# Patient Record
Sex: Male | Born: 1945 | Race: Black or African American | Hispanic: No | Smoking: Current every day smoker
Health system: Southern US, Community
[De-identification: ages and names within clinical notes are randomized; demographics above are authoritative.]

## PROBLEM LIST (undated history)

## (undated) DIAGNOSIS — E785 Hyperlipidemia, unspecified: Secondary | ICD-10-CM

## (undated) DIAGNOSIS — D649 Anemia, unspecified: Secondary | ICD-10-CM

## (undated) DIAGNOSIS — E119 Type 2 diabetes mellitus without complications: Secondary | ICD-10-CM

## (undated) DIAGNOSIS — C9590 Leukemia, unspecified not having achieved remission: Secondary | ICD-10-CM

## (undated) DIAGNOSIS — I10 Essential (primary) hypertension: Secondary | ICD-10-CM

## (undated) DIAGNOSIS — K219 Gastro-esophageal reflux disease without esophagitis: Secondary | ICD-10-CM

## (undated) HISTORY — DX: Anemia, unspecified: D64.9

## (undated) HISTORY — DX: Gastro-esophageal reflux disease without esophagitis: K21.9

## (undated) HISTORY — PX: PTCA: SHX146

## (undated) HISTORY — DX: Type 2 diabetes mellitus without complications: E11.9

## (undated) HISTORY — DX: Essential (primary) hypertension: I10

## (undated) HISTORY — DX: Hyperlipidemia, unspecified: E78.5

---

## 2006-10-23 ENCOUNTER — Inpatient Hospital Stay: Payer: Self-pay | Admitting: Internal Medicine

## 2006-10-23 ENCOUNTER — Other Ambulatory Visit: Payer: Self-pay

## 2008-03-11 ENCOUNTER — Ambulatory Visit: Payer: Self-pay

## 2009-08-20 ENCOUNTER — Emergency Department: Payer: Self-pay | Admitting: Emergency Medicine

## 2010-06-29 ENCOUNTER — Ambulatory Visit: Payer: Self-pay | Admitting: "Endocrinology

## 2011-05-30 ENCOUNTER — Emergency Department: Payer: Self-pay | Admitting: Emergency Medicine

## 2011-06-10 ENCOUNTER — Ambulatory Visit: Payer: Self-pay

## 2011-06-11 ENCOUNTER — Emergency Department: Payer: Self-pay | Admitting: Emergency Medicine

## 2011-06-11 LAB — URINALYSIS, COMPLETE
Glucose,UR: NEGATIVE mg/dL (ref 0–75)
Nitrite: NEGATIVE
Protein: NEGATIVE
RBC,UR: 32 /HPF (ref 0–5)
Squamous Epithelial: <1
WBC UR: 1 /HPF (ref 0–5)

## 2011-07-19 ENCOUNTER — Ambulatory Visit: Payer: Self-pay | Admitting: Ophthalmology

## 2011-07-19 LAB — POTASSIUM: Potassium: 4.6 mmol/L (ref 3.5–5.1)

## 2011-07-29 ENCOUNTER — Ambulatory Visit: Payer: Self-pay | Admitting: Ophthalmology

## 2011-11-13 DIAGNOSIS — I70209 Unspecified atherosclerosis of native arteries of extremities, unspecified extremity: Secondary | ICD-10-CM | POA: Insufficient documentation

## 2012-04-01 HISTORY — PX: UPPER GASTROINTESTINAL ENDOSCOPY: SHX188

## 2012-04-01 HISTORY — PX: POPLITEAL ARTERY ANGIOPLASTY: SHX2242

## 2012-04-01 HISTORY — PX: COLONOSCOPY: SHX174

## 2012-04-27 ENCOUNTER — Ambulatory Visit: Payer: Self-pay | Admitting: Internal Medicine

## 2012-05-16 LAB — CBC WITH DIFFERENTIAL/PLATELET
Eosinophil: 4 %
HCT: 38.2 % — ABNORMAL LOW (ref 40.0–52.0)
Lymphocytes: 11 %
MCH: 31.6 pg (ref 26.0–34.0)
Monocytes: 4 %
NRBC/100 WBC: 1 /
Segmented Neutrophils: 31 %
WBC: 12.6 10*3/uL — ABNORMAL HIGH (ref 3.8–10.6)

## 2012-05-16 LAB — BASIC METABOLIC PANEL
Anion Gap: 10 (ref 7–16)
Chloride: 103 mmol/L (ref 98–107)
Creatinine: 1.3 mg/dL (ref 0.60–1.30)
EGFR (African American): >60
EGFR (Non-African Amer.): 57 — ABNORMAL LOW
Glucose: 180 mg/dL — ABNORMAL HIGH (ref 65–99)
Osmolality: 278 (ref 275–301)
Sodium: 135 mmol/L — ABNORMAL LOW (ref 136–145)

## 2012-05-16 LAB — HEPATIC FUNCTION PANEL A (ARMC)
Alkaline Phosphatase: 88 U/L (ref 50–136)
Bilirubin, Direct: 0.1 mg/dL (ref 0.00–0.20)
Bilirubin,Total: 0.4 mg/dL (ref 0.2–1.0)
SGOT(AST): 17 U/L (ref 15–37)
SGPT (ALT): 13 U/L (ref 12–78)

## 2012-05-16 LAB — SEDIMENTATION RATE: Erythrocyte Sed Rate: 20 mm/hr (ref 0–20)

## 2012-05-18 ENCOUNTER — Inpatient Hospital Stay: Payer: Self-pay | Admitting: Internal Medicine

## 2012-05-18 LAB — BASIC METABOLIC PANEL
Anion Gap: 6 — ABNORMAL LOW (ref 7–16)
Chloride: 107 mmol/L (ref 98–107)
Co2: 25 mmol/L (ref 21–32)
Creatinine: 1.42 mg/dL — ABNORMAL HIGH (ref 0.60–1.30)
EGFR (Non-African Amer.): 51 — ABNORMAL LOW
Glucose: 166 mg/dL — ABNORMAL HIGH (ref 65–99)
Osmolality: 282 (ref 275–301)
Potassium: 4.5 mmol/L (ref 3.5–5.1)
Sodium: 138 mmol/L (ref 136–145)

## 2012-05-18 LAB — CBC WITH DIFFERENTIAL/PLATELET
Comment - H1-Com1: NORMAL
HGB: 11.6 g/dL — ABNORMAL LOW (ref 13.0–18.0)
Lymphocytes: 58 %
MCH: 31.1 pg (ref 26.0–34.0)
MCHC: 32.3 g/dL (ref 32.0–36.0)
MCV: 96 fL (ref 80–100)
Monocytes: 4 %
Platelet: 190 10*3/uL (ref 150–440)
RBC: 3.74 10*6/uL — ABNORMAL LOW (ref 4.40–5.90)
Variant Lymphocyte - H1-Rlymph: 9 %
WBC: 13.3 10*3/uL — ABNORMAL HIGH (ref 3.8–10.6)

## 2012-05-19 LAB — CBC WITH DIFFERENTIAL/PLATELET
Basophil #: 0.1 10*3/uL (ref 0.0–0.1)
Eosinophil #: 0.3 10*3/uL (ref 0.0–0.7)
Eosinophil %: 2.1 %
HCT: 33.4 % — ABNORMAL LOW (ref 40.0–52.0)
HGB: 10.8 g/dL — ABNORMAL LOW (ref 13.0–18.0)
Lymphocyte %: 50.2 %
Monocyte #: 1 x10 3/mm (ref 0.2–1.0)
Monocyte %: 7.2 %
Neutrophil %: 39.9 %
RBC: 3.47 10*6/uL — ABNORMAL LOW (ref 4.40–5.90)
RDW: 14.3 % (ref 11.5–14.5)
WBC: 13.9 10*3/uL — ABNORMAL HIGH (ref 3.8–10.6)

## 2012-05-19 LAB — BASIC METABOLIC PANEL
BUN: 23 mg/dL — ABNORMAL HIGH (ref 7–18)
Chloride: 107 mmol/L (ref 98–107)
Co2: 26 mmol/L (ref 21–32)
Creatinine: 1.51 mg/dL — ABNORMAL HIGH (ref 0.60–1.30)
EGFR (Non-African Amer.): 48 — ABNORMAL LOW
Glucose: 171 mg/dL — ABNORMAL HIGH (ref 65–99)
Sodium: 140 mmol/L (ref 136–145)

## 2012-05-20 LAB — BASIC METABOLIC PANEL
Anion Gap: 7 (ref 7–16)
BUN: 17 mg/dL (ref 7–18)
Calcium, Total: 9.1 mg/dL (ref 8.5–10.1)
Chloride: 107 mmol/L (ref 98–107)
Co2: 25 mmol/L (ref 21–32)
Creatinine: 1.25 mg/dL (ref 0.60–1.30)
EGFR (African American): >60
EGFR (Non-African Amer.): >60

## 2012-05-20 LAB — CBC WITH DIFFERENTIAL/PLATELET
HCT: 36 % — ABNORMAL LOW (ref 40.0–52.0)
MCV: 97 fL (ref 80–100)
Monocytes: 4 %
NRBC/100 WBC: 0 /
RBC: 3.69 10*6/uL — ABNORMAL LOW (ref 4.40–5.90)
RDW: 14.5 % (ref 11.5–14.5)
Segmented Neutrophils: 21 %

## 2012-05-21 ENCOUNTER — Ambulatory Visit: Payer: Self-pay | Admitting: Hematology and Oncology

## 2012-06-01 ENCOUNTER — Encounter: Payer: Self-pay | Admitting: Cardiothoracic Surgery

## 2012-06-01 ENCOUNTER — Encounter: Payer: Self-pay | Admitting: Nurse Practitioner

## 2012-06-10 ENCOUNTER — Ambulatory Visit: Payer: Self-pay | Admitting: Hematology and Oncology

## 2012-06-11 LAB — CBC CANCER CENTER
Eosinophil %: 0.9 %
HCT: 33.7 % — ABNORMAL LOW (ref 40.0–52.0)
HGB: 11.1 g/dL — ABNORMAL LOW (ref 13.0–18.0)
Lymphocyte %: 59.7 %
MCH: 31.6 pg (ref 26.0–34.0)
MCHC: 33 g/dL (ref 32.0–36.0)
MCV: 96 fL (ref 80–100)
Monocyte #: 0.7 x10 3/mm (ref 0.2–1.0)
Neutrophil %: 34.1 %
Platelet: 247 x10 3/mm (ref 150–440)
RBC: 3.52 10*6/uL — ABNORMAL LOW (ref 4.40–5.90)
RDW: 14.9 % — ABNORMAL HIGH (ref 11.5–14.5)

## 2012-06-30 ENCOUNTER — Ambulatory Visit: Payer: Self-pay | Admitting: Hematology and Oncology

## 2012-06-30 ENCOUNTER — Encounter: Payer: Self-pay | Admitting: Cardiothoracic Surgery

## 2012-06-30 ENCOUNTER — Encounter: Payer: Self-pay | Admitting: Nurse Practitioner

## 2012-07-23 LAB — CBC CANCER CENTER
Eosinophil #: 0.2 x10 3/mm (ref 0.0–0.7)
Eosinophil %: 2 %
HGB: 12.4 g/dL — ABNORMAL LOW (ref 13.0–18.0)
Lymphocyte %: 60.2 %
MCH: 31.5 pg (ref 26.0–34.0)
MCHC: 32.2 g/dL (ref 32.0–36.0)
Neutrophil #: 3.8 x10 3/mm (ref 1.4–6.5)
Neutrophil %: 31.7 %
Platelet: 141 x10 3/mm — ABNORMAL LOW (ref 150–440)
RBC: 3.94 10*6/uL — ABNORMAL LOW (ref 4.40–5.90)
RDW: 15 % — ABNORMAL HIGH (ref 11.5–14.5)
WBC: 12 x10 3/mm — ABNORMAL HIGH (ref 3.8–10.6)

## 2012-07-30 ENCOUNTER — Ambulatory Visit: Payer: Self-pay | Admitting: Hematology and Oncology

## 2012-12-22 DIAGNOSIS — I359 Nonrheumatic aortic valve disorder, unspecified: Secondary | ICD-10-CM | POA: Insufficient documentation

## 2013-02-17 ENCOUNTER — Inpatient Hospital Stay: Payer: Self-pay | Admitting: Internal Medicine

## 2013-02-17 LAB — COMPREHENSIVE METABOLIC PANEL
Albumin: 2.8 g/dL — ABNORMAL LOW (ref 3.4–5.0)
BUN: 24 mg/dL — ABNORMAL HIGH (ref 7–18)
Bilirubin,Total: 0.2 mg/dL (ref 0.2–1.0)
Calcium, Total: 8.1 mg/dL — ABNORMAL LOW (ref 8.5–10.1)
Co2: 22 mmol/L (ref 21–32)
Creatinine: 1.35 mg/dL — ABNORMAL HIGH (ref 0.60–1.30)
EGFR (African American): >60
EGFR (Non-African Amer.): 54 — ABNORMAL LOW
Glucose: 188 mg/dL — ABNORMAL HIGH (ref 65–99)
Potassium: 5.4 mmol/L — ABNORMAL HIGH (ref 3.5–5.1)
SGOT(AST): 10 U/L — ABNORMAL LOW (ref 15–37)
SGPT (ALT): 13 U/L (ref 12–78)
Total Protein: 5.6 g/dL — ABNORMAL LOW (ref 6.4–8.2)

## 2013-02-17 LAB — CBC
MCHC: 32.9 g/dL (ref 32.0–36.0)
MCV: 95 fL (ref 80–100)
Platelet: 149 10*3/uL — ABNORMAL LOW (ref 150–440)
RBC: 2.36 10*6/uL — ABNORMAL LOW (ref 4.40–5.90)
RDW: 15.1 % — ABNORMAL HIGH (ref 11.5–14.5)

## 2013-02-17 LAB — CK TOTAL AND CKMB (NOT AT ARMC): CK-MB: 0.8 ng/mL (ref 0.5–3.6)

## 2013-02-17 LAB — TROPONIN I: Troponin-I: <0.02 ng/mL

## 2013-02-18 LAB — BASIC METABOLIC PANEL
Calcium, Total: 7.3 mg/dL — ABNORMAL LOW (ref 8.5–10.1)
Co2: 26 mmol/L (ref 21–32)
Creatinine: 1.46 mg/dL — ABNORMAL HIGH (ref 0.60–1.30)
EGFR (Non-African Amer.): 49 — ABNORMAL LOW
Glucose: 116 mg/dL — ABNORMAL HIGH (ref 65–99)

## 2013-02-18 LAB — URINALYSIS, COMPLETE
Bacteria: NONE SEEN
Glucose,UR: NEGATIVE mg/dL (ref 0–75)
Ketone: NEGATIVE
Nitrite: NEGATIVE
Ph: 6 (ref 4.5–8.0)
Specific Gravity: 1.008 (ref 1.003–1.030)
Squamous Epithelial: 1
WBC UR: <1 /HPF (ref 0–5)

## 2013-02-18 LAB — CBC WITH DIFFERENTIAL/PLATELET
Basophil #: 0.1 10*3/uL (ref 0.0–0.1)
Basophil %: 0.5 %
Eosinophil #: 0.1 10*3/uL (ref 0.0–0.7)
Eosinophil %: 0.6 %
HCT: 18.1 % — ABNORMAL LOW (ref 40.0–52.0)
HGB: 5.9 g/dL — ABNORMAL LOW (ref 13.0–18.0)
Lymphocyte #: 15.6 10*3/uL — ABNORMAL HIGH (ref 1.0–3.6)
Lymphocyte %: 72.9 %
MCH: 29.6 pg (ref 26.0–34.0)
MCHC: 32.5 g/dL (ref 32.0–36.0)
MCV: 91 fL (ref 80–100)
Monocyte #: 0.9 x10 3/mm (ref 0.2–1.0)
RDW: 16.1 % — ABNORMAL HIGH (ref 11.5–14.5)
WBC: 21.5 10*3/uL — ABNORMAL HIGH (ref 3.8–10.6)

## 2013-02-19 LAB — HEMOGLOBIN: HGB: 8.2 g/dL — ABNORMAL LOW (ref 13.0–18.0)

## 2013-02-22 ENCOUNTER — Ambulatory Visit: Payer: Self-pay | Admitting: Gastroenterology

## 2013-04-17 LAB — COMPREHENSIVE METABOLIC PANEL
ALT: 17 U/L (ref 12–78)
ANION GAP: 4 — AB (ref 7–16)
AST: 35 U/L (ref 15–37)
Albumin: 2.9 g/dL — ABNORMAL LOW (ref 3.4–5.0)
Alkaline Phosphatase: 62 U/L
BUN: 32 mg/dL — ABNORMAL HIGH (ref 7–18)
Bilirubin,Total: 0.2 mg/dL (ref 0.2–1.0)
CHLORIDE: 113 mmol/L — AB (ref 98–107)
CREATININE: 1.03 mg/dL (ref 0.60–1.30)
Calcium, Total: 8.3 mg/dL — ABNORMAL LOW (ref 8.5–10.1)
Co2: 22 mmol/L (ref 21–32)
EGFR (Non-African Amer.): >60
GLUCOSE: 120 mg/dL — AB (ref 65–99)
Osmolality: 286 (ref 275–301)
Potassium: 5.5 mmol/L — ABNORMAL HIGH (ref 3.5–5.1)
SODIUM: 139 mmol/L (ref 136–145)
Total Protein: 5.8 g/dL — ABNORMAL LOW (ref 6.4–8.2)

## 2013-04-17 LAB — CBC
HCT: 26.1 % — ABNORMAL LOW (ref 40.0–52.0)
HGB: 8.5 g/dL — ABNORMAL LOW (ref 13.0–18.0)
MCH: 30 pg (ref 26.0–34.0)
MCHC: 32.7 g/dL (ref 32.0–36.0)
MCV: 92 fL (ref 80–100)
PLATELETS: 105 10*3/uL — AB (ref 150–440)
RBC: 2.85 10*6/uL — AB (ref 4.40–5.90)
RDW: 16.5 % — AB (ref 11.5–14.5)
WBC: 21.4 10*3/uL — ABNORMAL HIGH (ref 3.8–10.6)

## 2013-04-17 LAB — TROPONIN I

## 2013-04-18 ENCOUNTER — Inpatient Hospital Stay: Payer: Self-pay | Admitting: Family Medicine

## 2013-04-18 LAB — CBC WITH DIFFERENTIAL/PLATELET
BASOS ABS: 0.1 10*3/uL (ref 0.0–0.1)
Basophil #: 0.1 10*3/uL (ref 0.0–0.1)
Basophil %: 0.4 %
Basophil %: 0.5 %
EOS PCT: 0.9 %
EOS PCT: 1.2 %
Eosinophil #: 0.2 10*3/uL (ref 0.0–0.7)
Eosinophil #: 0.3 10*3/uL (ref 0.0–0.7)
HCT: 20.3 % — ABNORMAL LOW (ref 40.0–52.0)
HCT: 26.3 % — AB (ref 40.0–52.0)
HGB: 6.3 g/dL — ABNORMAL LOW (ref 13.0–18.0)
HGB: 8.5 g/dL — ABNORMAL LOW (ref 13.0–18.0)
Lymphocyte #: 14.1 10*3/uL — ABNORMAL HIGH (ref 1.0–3.6)
Lymphocyte #: 14.8 10*3/uL — ABNORMAL HIGH (ref 1.0–3.6)
Lymphocyte %: 69.4 %
Lymphocyte %: 65.5 %
MCH: 28.8 pg (ref 26.0–34.0)
MCH: 29.3 pg (ref 26.0–34.0)
MCHC: 31.2 g/dL — AB (ref 32.0–36.0)
MCHC: 32.4 g/dL (ref 32.0–36.0)
MCV: 92 fL (ref 80–100)
MCV: 90 fL (ref 80–100)
MONOS PCT: 4.3 %
Monocyte #: 0.9 x10 3/mm (ref 0.2–1.0)
Monocyte #: 1 x10 3/mm (ref 0.2–1.0)
Monocyte %: 4.2 %
NEUTROS ABS: 5.1 10*3/uL (ref 1.4–6.5)
NEUTROS PCT: 28.5 %
Neutrophil #: 6.4 10*3/uL (ref 1.4–6.5)
Neutrophil %: 25.1 %
PLATELETS: 100 10*3/uL — AB (ref 150–440)
Platelet: 86 10*3/uL — ABNORMAL LOW (ref 150–440)
RBC: 2.2 10*6/uL — ABNORMAL LOW (ref 4.40–5.90)
RBC: 2.91 10*6/uL — AB (ref 4.40–5.90)
RDW: 16.5 % — ABNORMAL HIGH (ref 11.5–14.5)
RDW: 16.6 % — ABNORMAL HIGH (ref 11.5–14.5)
WBC: 20.3 10*3/uL — AB (ref 3.8–10.6)
WBC: 22.6 10*3/uL — ABNORMAL HIGH (ref 3.8–10.6)

## 2013-04-18 LAB — BASIC METABOLIC PANEL
Anion Gap: 3 — ABNORMAL LOW (ref 7–16)
BUN: 31 mg/dL — ABNORMAL HIGH (ref 7–18)
Calcium, Total: 7.7 mg/dL — ABNORMAL LOW (ref 8.5–10.1)
Chloride: 114 mmol/L — ABNORMAL HIGH (ref 98–107)
Co2: 24 mmol/L (ref 21–32)
Creatinine: 1.35 mg/dL — ABNORMAL HIGH (ref 0.60–1.30)
EGFR (African American): >60
EGFR (Non-African Amer.): 54 — ABNORMAL LOW
Glucose: 248 mg/dL — ABNORMAL HIGH (ref 65–99)
Osmolality: 296 (ref 275–301)
Potassium: 4.9 mmol/L (ref 3.5–5.1)
Sodium: 141 mmol/L (ref 136–145)

## 2013-04-19 LAB — CBC WITH DIFFERENTIAL/PLATELET
BASOS ABS: 0.1 10*3/uL (ref 0.0–0.1)
Basophil %: 0.7 %
Eosinophil #: 0.2 10*3/uL (ref 0.0–0.7)
Eosinophil %: 1 %
HCT: 24.9 % — ABNORMAL LOW (ref 40.0–52.0)
HGB: 8 g/dL — AB (ref 13.0–18.0)
LYMPHS ABS: 14.7 10*3/uL — AB (ref 1.0–3.6)
Lymphocyte %: 77.3 %
MCH: 29.5 pg (ref 26.0–34.0)
MCHC: 32.3 g/dL (ref 32.0–36.0)
MCV: 91 fL (ref 80–100)
MONO ABS: 0.7 x10 3/mm (ref 0.2–1.0)
Monocyte %: 3.7 %
NEUTROS ABS: 3.3 10*3/uL (ref 1.4–6.5)
Neutrophil %: 17.3 %
Platelet: 94 10*3/uL — ABNORMAL LOW (ref 150–440)
RBC: 2.73 10*6/uL — ABNORMAL LOW (ref 4.40–5.90)
RDW: 17.1 % — ABNORMAL HIGH (ref 11.5–14.5)
WBC: 19 10*3/uL — ABNORMAL HIGH (ref 3.8–10.6)

## 2013-04-19 LAB — BASIC METABOLIC PANEL
ANION GAP: 4 — AB (ref 7–16)
BUN: 18 mg/dL (ref 7–18)
CALCIUM: 8.5 mg/dL (ref 8.5–10.1)
CO2: 25 mmol/L (ref 21–32)
Chloride: 113 mmol/L — ABNORMAL HIGH (ref 98–107)
Creatinine: 1.13 mg/dL (ref 0.60–1.30)
EGFR (African American): >60
GLUCOSE: 87 mg/dL (ref 65–99)
Osmolality: 284 (ref 275–301)
Potassium: 4.1 mmol/L (ref 3.5–5.1)
Sodium: 142 mmol/L (ref 136–145)

## 2013-04-19 LAB — HEMOGLOBIN: HGB: 6.6 g/dL — AB (ref 13.0–18.0)

## 2013-04-20 LAB — CBC WITH DIFFERENTIAL/PLATELET
Basophil #: 0.1 10*3/uL (ref 0.0–0.1)
Basophil %: 0.4 %
Eosinophil #: 0.4 10*3/uL (ref 0.0–0.7)
Eosinophil %: 1.9 %
HCT: 25.1 % — AB (ref 40.0–52.0)
HGB: 8.3 g/dL — AB (ref 13.0–18.0)
Lymphocyte #: 11.5 10*3/uL — ABNORMAL HIGH (ref 1.0–3.6)
Lymphocyte %: 60.2 %
MCH: 29.6 pg (ref 26.0–34.0)
MCHC: 32.9 g/dL (ref 32.0–36.0)
MCV: 90 fL (ref 80–100)
Monocyte #: 1 x10 3/mm (ref 0.2–1.0)
Monocyte %: 5.2 %
NEUTROS ABS: 6.1 10*3/uL (ref 1.4–6.5)
Neutrophil %: 32.3 %
PLATELETS: 88 10*3/uL — AB (ref 150–440)
RBC: 2.8 10*6/uL — AB (ref 4.40–5.90)
RDW: 16.5 % — AB (ref 11.5–14.5)
WBC: 19 10*3/uL — ABNORMAL HIGH (ref 3.8–10.6)

## 2013-04-20 LAB — HEMOGLOBIN: HGB: 9.7 g/dL — ABNORMAL LOW (ref 13.0–18.0)

## 2013-04-21 LAB — CBC WITH DIFFERENTIAL/PLATELET
Basophil #: 0.1 10*3/uL (ref 0.0–0.1)
Basophil %: 0.7 %
Eosinophil #: 0.4 10*3/uL (ref 0.0–0.7)
Eosinophil %: 2.9 %
HCT: 24.6 % — ABNORMAL LOW (ref 40.0–52.0)
HGB: 8.2 g/dL — AB (ref 13.0–18.0)
LYMPHS ABS: 8.6 10*3/uL — AB (ref 1.0–3.6)
LYMPHS PCT: 57.4 %
MCH: 30 pg (ref 26.0–34.0)
MCHC: 33.2 g/dL (ref 32.0–36.0)
MCV: 91 fL (ref 80–100)
MONO ABS: 1.1 x10 3/mm — AB (ref 0.2–1.0)
MONOS PCT: 7.2 %
NEUTROS ABS: 4.8 10*3/uL (ref 1.4–6.5)
NEUTROS PCT: 31.8 %
Platelet: 92 10*3/uL — ABNORMAL LOW (ref 150–440)
RBC: 2.72 10*6/uL — ABNORMAL LOW (ref 4.40–5.90)
RDW: 16.3 % — ABNORMAL HIGH (ref 11.5–14.5)
WBC: 15 10*3/uL — AB (ref 3.8–10.6)

## 2013-04-21 LAB — BASIC METABOLIC PANEL
Anion Gap: 6 — ABNORMAL LOW (ref 7–16)
BUN: 7 mg/dL (ref 7–18)
CO2: 21 mmol/L (ref 21–32)
CREATININE: 1.15 mg/dL (ref 0.60–1.30)
Calcium, Total: 8.7 mg/dL (ref 8.5–10.1)
Chloride: 114 mmol/L — ABNORMAL HIGH (ref 98–107)
Glucose: 144 mg/dL — ABNORMAL HIGH (ref 65–99)
Osmolality: 282 (ref 275–301)
Potassium: 4.1 mmol/L (ref 3.5–5.1)
SODIUM: 141 mmol/L (ref 136–145)

## 2013-04-22 LAB — CBC WITH DIFFERENTIAL/PLATELET
BASOS ABS: 0.1 10*3/uL (ref 0.0–0.1)
BASOS PCT: 0.3 %
Eosinophil #: 0.4 10*3/uL (ref 0.0–0.7)
Eosinophil %: 2.2 %
HCT: 25.8 % — ABNORMAL LOW (ref 40.0–52.0)
HGB: 8.3 g/dL — ABNORMAL LOW (ref 13.0–18.0)
LYMPHS PCT: 66.1 %
Lymphocyte #: 12 10*3/uL — ABNORMAL HIGH (ref 1.0–3.6)
MCH: 29.9 pg (ref 26.0–34.0)
MCHC: 32.2 g/dL (ref 32.0–36.0)
MCV: 93 fL (ref 80–100)
MONO ABS: 1 x10 3/mm (ref 0.2–1.0)
MONOS PCT: 5.3 %
Neutrophil #: 4.7 10*3/uL (ref 1.4–6.5)
Neutrophil %: 26.1 %
Platelet: 107 10*3/uL — ABNORMAL LOW (ref 150–440)
RBC: 2.78 10*6/uL — AB (ref 4.40–5.90)
RDW: 16.5 % — ABNORMAL HIGH (ref 11.5–14.5)
WBC: 18.2 10*3/uL — AB (ref 3.8–10.6)

## 2013-06-15 ENCOUNTER — Ambulatory Visit: Payer: Self-pay | Admitting: Hematology and Oncology

## 2013-06-15 LAB — BASIC METABOLIC PANEL
Anion Gap: 10 (ref 7–16)
BUN: 19 mg/dL — AB (ref 7–18)
CALCIUM: 9.8 mg/dL (ref 8.5–10.1)
CHLORIDE: 104 mmol/L (ref 98–107)
Co2: 27 mmol/L (ref 21–32)
Creatinine: 1.31 mg/dL — ABNORMAL HIGH (ref 0.60–1.30)
EGFR (African American): >60
EGFR (Non-African Amer.): 56 — ABNORMAL LOW
GLUCOSE: 196 mg/dL — AB (ref 65–99)
Osmolality: 289 (ref 275–301)
Potassium: 4.2 mmol/L (ref 3.5–5.1)
SODIUM: 141 mmol/L (ref 136–145)

## 2013-06-15 LAB — CBC CANCER CENTER
BASOS PCT: 0.8 %
Basophil #: 0.1 x10 3/mm (ref 0.0–0.1)
EOS PCT: 2.1 %
Eosinophil #: 0.3 x10 3/mm (ref 0.0–0.7)
HCT: 39.6 % — ABNORMAL LOW (ref 40.0–52.0)
HGB: 12.6 g/dL — ABNORMAL LOW (ref 13.0–18.0)
LYMPHS PCT: 62.4 %
Lymphocyte #: 9.8 x10 3/mm — ABNORMAL HIGH (ref 1.0–3.6)
MCH: 29.2 pg (ref 26.0–34.0)
MCHC: 31.7 g/dL — ABNORMAL LOW (ref 32.0–36.0)
MCV: 92 fL (ref 80–100)
MONO ABS: 0.8 x10 3/mm (ref 0.2–1.0)
Monocyte %: 5.1 %
NEUTROS ABS: 4.6 x10 3/mm (ref 1.4–6.5)
NEUTROS PCT: 29.6 %
PLATELETS: 141 x10 3/mm — AB (ref 150–440)
RBC: 4.31 10*6/uL — ABNORMAL LOW (ref 4.40–5.90)
RDW: 16.2 % — ABNORMAL HIGH (ref 11.5–14.5)
WBC: 15.7 x10 3/mm — AB (ref 3.8–10.6)

## 2013-06-30 ENCOUNTER — Ambulatory Visit: Payer: Self-pay | Admitting: Hematology and Oncology

## 2014-04-28 DIAGNOSIS — M79609 Pain in unspecified limb: Secondary | ICD-10-CM | POA: Diagnosis not present

## 2014-04-28 DIAGNOSIS — E785 Hyperlipidemia, unspecified: Secondary | ICD-10-CM | POA: Diagnosis not present

## 2014-04-28 DIAGNOSIS — I10 Essential (primary) hypertension: Secondary | ICD-10-CM | POA: Diagnosis not present

## 2014-04-28 DIAGNOSIS — I70212 Atherosclerosis of native arteries of extremities with intermittent claudication, left leg: Secondary | ICD-10-CM | POA: Diagnosis not present

## 2014-04-29 DIAGNOSIS — I739 Peripheral vascular disease, unspecified: Secondary | ICD-10-CM | POA: Diagnosis not present

## 2014-04-29 DIAGNOSIS — M79609 Pain in unspecified limb: Secondary | ICD-10-CM | POA: Diagnosis not present

## 2014-04-29 DIAGNOSIS — I70212 Atherosclerosis of native arteries of extremities with intermittent claudication, left leg: Secondary | ICD-10-CM | POA: Diagnosis not present

## 2014-04-29 DIAGNOSIS — E785 Hyperlipidemia, unspecified: Secondary | ICD-10-CM | POA: Diagnosis not present

## 2014-05-05 DIAGNOSIS — I350 Nonrheumatic aortic (valve) stenosis: Secondary | ICD-10-CM | POA: Diagnosis not present

## 2014-05-05 DIAGNOSIS — D509 Iron deficiency anemia, unspecified: Secondary | ICD-10-CM | POA: Diagnosis not present

## 2014-05-10 DIAGNOSIS — E119 Type 2 diabetes mellitus without complications: Secondary | ICD-10-CM | POA: Diagnosis not present

## 2014-05-10 DIAGNOSIS — E784 Other hyperlipidemia: Secondary | ICD-10-CM | POA: Diagnosis not present

## 2014-05-10 DIAGNOSIS — I10 Essential (primary) hypertension: Secondary | ICD-10-CM | POA: Diagnosis not present

## 2014-05-10 DIAGNOSIS — Z125 Encounter for screening for malignant neoplasm of prostate: Secondary | ICD-10-CM | POA: Diagnosis not present

## 2014-05-11 ENCOUNTER — Ambulatory Visit: Payer: Self-pay | Admitting: Vascular Surgery

## 2014-05-11 DIAGNOSIS — E780 Pure hypercholesterolemia: Secondary | ICD-10-CM | POA: Diagnosis not present

## 2014-05-11 DIAGNOSIS — I70211 Atherosclerosis of native arteries of extremities with intermittent claudication, right leg: Secondary | ICD-10-CM | POA: Diagnosis not present

## 2014-05-11 DIAGNOSIS — I10 Essential (primary) hypertension: Secondary | ICD-10-CM | POA: Diagnosis not present

## 2014-05-11 DIAGNOSIS — E119 Type 2 diabetes mellitus without complications: Secondary | ICD-10-CM | POA: Diagnosis not present

## 2014-05-11 DIAGNOSIS — I70212 Atherosclerosis of native arteries of extremities with intermittent claudication, left leg: Secondary | ICD-10-CM | POA: Diagnosis not present

## 2014-05-11 DIAGNOSIS — Z79899 Other long term (current) drug therapy: Secondary | ICD-10-CM | POA: Diagnosis not present

## 2014-05-17 DIAGNOSIS — R011 Cardiac murmur, unspecified: Secondary | ICD-10-CM | POA: Diagnosis not present

## 2014-05-17 DIAGNOSIS — I509 Heart failure, unspecified: Secondary | ICD-10-CM | POA: Diagnosis not present

## 2014-05-30 DIAGNOSIS — I739 Peripheral vascular disease, unspecified: Secondary | ICD-10-CM | POA: Diagnosis not present

## 2014-05-30 DIAGNOSIS — F17200 Nicotine dependence, unspecified, uncomplicated: Secondary | ICD-10-CM | POA: Diagnosis not present

## 2014-05-30 DIAGNOSIS — I10 Essential (primary) hypertension: Secondary | ICD-10-CM | POA: Diagnosis not present

## 2014-05-30 DIAGNOSIS — I70213 Atherosclerosis of native arteries of extremities with intermittent claudication, bilateral legs: Secondary | ICD-10-CM | POA: Diagnosis not present

## 2014-06-22 DIAGNOSIS — R011 Cardiac murmur, unspecified: Secondary | ICD-10-CM | POA: Diagnosis not present

## 2014-07-15 DIAGNOSIS — I119 Hypertensive heart disease without heart failure: Secondary | ICD-10-CM | POA: Diagnosis not present

## 2014-07-15 DIAGNOSIS — R011 Cardiac murmur, unspecified: Secondary | ICD-10-CM | POA: Diagnosis not present

## 2014-07-20 DIAGNOSIS — H4011X2 Primary open-angle glaucoma, moderate stage: Secondary | ICD-10-CM | POA: Diagnosis not present

## 2014-07-22 NOTE — Consult Note (Signed)
Chief Complaint:  Subjective/Chief Complaint Feels well. No GI complaints. Though there was a drop in hgb, no bleeding yest.   VITAL SIGNS/ANCILLARY NOTES: **Vital Signs.:   21-Nov-14 04:34  Vital Signs Type Routine  Temperature Temperature (F) 98.4  Celsius 36.8  Pulse Pulse 63  Respirations Respirations 20  Systolic BP Systolic BP 244  Diastolic BP (mmHg) Diastolic BP (mmHg) 74  Mean BP 103  Pulse Ox % Pulse Ox % 98  Pulse Ox Activity Level  At rest  Oxygen Delivery Room Air/ 21 %   Brief Assessment:  GEN no acute distress   Cardiac Regular   Respiratory clear BS   Gastrointestinal Normal   Lab Results:  Routine Hem:  21-Nov-14 05:43   Hemoglobin (CBC)  8.2 (Result(s) reported on 19 Feb 2013 at 06:29AM.)   Assessment/Plan:  Assessment/Plan:  Assessment GI bleeding on ASa/plavix. No bleeding now. Duodenitis.   Plan If no further bleeding, patient can be discharged to home on daily PPI. Continue to hold plavix. Plan on colonoscopy as outpt on Monday here at [facility]. Will have office give prep instructions for patient before he is discharged.IF patient rebleeds today, then can keep patient over the weekend and please order bowel prep on Sunday. Thanks.   Electronic Signatures: Verdie Shire (MD)  (Signed 21-Nov-14 07:50)  Authored: Chief Complaint, VITAL SIGNS/ANCILLARY NOTES, Brief Assessment, Lab Results, Assessment/Plan   Last Updated: 21-Nov-14 07:50 by Verdie Shire (MD)

## 2014-07-22 NOTE — H&P (Signed)
PATIENT NAME:  Manuel Randolph, Manuel Randolph MR#:  [id number] DATE OF BIRTH:  Sep 23, 1945  DATE OF ADMISSION:  02/17/2013  PRIMARY CARE PHYSICIAN: Dr. Halina Maidens.   CHIEF COMPLAINT: Presyncope and black/maroon stools.   HISTORY OF PRESENT ILLNESS: This is a 69 year old male who was in his usual state of health when this morning he had a fairly large black/maroon bowel movement. 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 While he was waiting in the office, he had a presyncopal episode where he sort of blacked out but did not lose complete consciousness. He was very diaphoretic as per his significant other. EMS was called and he was brought to the hospital. The patient does have a history of diabetes but his blood sugars were stable. He was noted to be hypotensive with systolic blood pressures in the 80s. He presented to the hospital and noted to have a hemoglobin of 7.3. His previous hemoglobin was around 10. The patient was diagnosed with possibly having a presyncopal episode along a GI bleed and hospitalist services were contacted for further treatment and evaluation. The patient denied any chest pain, any shortness of breath, any nausea or any vomiting, any abdominal pain, any diarrhea, any headache or any other associated symptoms presently.   REVIEW OF SYSTEMS: CONSTITUTIONAL: No documented fever. No weight gain or weight loss.  EYES: No blurred or double vision.  ENT: No tinnitus. No postnasal drip. No redness of the oropharynx.  RESPIRATORY: No cough. No wheeze. No hemoptysis. No dyspnea.  CARDIOVASCULAR: No chest pain. No orthopnea. No palpitations. Positive presyncope.  GASTROINTESTINAL: No nausea, no vomiting, no diarrhea. No abdominal pain.  Positive melena. No hematochezia.  GENITOURINARY: No dysuria or hematuria.  ENDOCRINE: No polyuria or nocturia. No heat or cold intolerance.  HEMATOLOGIC: Positive anemia, no acute bruising or bleeding.  INTEGUMENTARY: No rashes  or lesions.  MUSCULOSKELETAL: No arthritis, no swelling, no gout.  NEUROLOGIC: No numbness or tingling. No ataxia. No seizure-type activity.  PSYCHIATRIC: No anxiety, no insomnia. No ADD.   PAST MEDICAL HISTORY: Consistent with diabetes, hypertension, hyperlipidemia, history of peripheral vascular disease, diabetic neuropathy, history of [name].   ALLERGIES: No known drug allergies.   SOCIAL HISTORY: Does smoke about a pack per day, has been smoking for the past 40 to 50 years. Used to be a heavy drinker, quit about 5 to 6 years ago. No illicit drug abuse. Lives at home with his girlfriend.   FAMILY HISTORY: The patient's mother and father are both deceased. Mother died from complications of congestive heart failure. Father died from a brain tumor.   CURRENT MEDICATIONS: As follows: Tylenol with oxycodone 5/325 one tab q.4 hours as needed, aspirin 81 mg daily, cilostazol 100 mg t.i.d., Plavix 75 mg daily, gabapentin 300 mg b.i.d., lisinopril 40 mg b.i.d., metformin 5 mg b.i.d., metoprolol tartrate 25 mg b.i.d., ranitidine 150 mg b.i.d., Senokot 1 tablet b.i.d. as needed and simvastatin 20 mg at bedtime.   PHYSICAL EXAMINATION: Presently is as follows:  VITAL SIGNS:  Temperature is 97.6, pulse 68, respirations 16, blood pressure 126/62, sats 99% on room air.  GENERAL:  The patient is a pleasant-appearing male, lethargic, but in no apparent distress. HEAD, EYES, EARS, NOSE, THROAT: He is atraumatic, normocephalic. His extraocular muscles are intact. His pupils are equal and reactive to light. His sclerae are anicteric. No conjunctival injection. No oropharyngeal erythema.  NECK: Supple. There is no jugular venous distention. No bruits. No lymphadenopathy or thyromegaly.  HEART: Regular rate and rhythm. He does have a 2 out of 6 systolic ejection murmur at the right sternal border. No rubs, no clicks.  LUNGS: Clear to auscultation bilaterally. No rales or rhonchi. No wheezes.  ABDOMEN: Soft, flat,  nontender, nondistended. Has good bowel sounds. No hepatosplenomegaly appreciated.  EXTREMITIES: No evidence of any cyanosis, clubbing or peripheral edema. Has +2 pedal and radial pulses bilaterally.  NEUROLOGICAL: The patient is alert, awake and oriented x 3 with no focal motor or sensory deficits appreciated bilaterally.  SKIN: Moist and warm with no rashes appreciated.  LYMPHATIC: There is no cervical or axillary lymphadenopathy.   LABORATORY AND RADIOLOGICAL DATA: Serum glucose 188, BUN 24, creatinine 1.3, sodium 140, potassium 5.4, chloride 111, bicarb 22. The patient's LFTs are within normal limits. Troponin less than 0.02. White cell count of 19.3, hemoglobin 7.3, hematocrit 22.3, platelet count 149. INR is 1.2.   The patient did have a chest x-ray done which showed no evidence of an acute cardiopulmonary disease.   ASSESSMENT AND PLAN: This is a 69 year old male with a history of diabetes, hypertension, hyperlipidemia, history of peripheral vascular disease, diabetic neuropathy, history of chronic lymphocytic leukemia, who presents to the hospital due to melanotic maroon stools and also noted to have a presyncopal event.  1.  Gastrointestinal bleed. This is likely a slow upper gastrointestinal bleed given the patient's melena and maroon stools. The patient has previously never had an endoscopy or colonoscopy in the past. He is on aspirin and Plavix which I will hold. I will follow serial hemoglobins, transfuse him 1 unit of packed red blood cells due to his symptomatic anemia, get a gastroenterology consult, discussed the case with Dr. Candace Cruise. Place him on Protonix b.i.d. and a clear liquid diet.  2.  Presyncope. This is likely secondary to his symptomatic anemia. I do not see any evidence of neurocardiogenic syncope. I will hold any further workup at this time.  3.  Diabetes. Since the patient is going to be a clear liquid diet, I will place him on just sliding scale insulin.  4.  History of  peripheral vascular disease. Continue his statin, continue his Pletal. I will hold his aspirin and Plavix given the gastrointestinal bleed.  5.  Diabetic neuropathy. Continue Neurontin.  6.  Acute renal failure. This is likely secondary to volume loss and gastrointestinal bleed. I will transfuse him blood and follow his BUN and creatinine.  7.  The patient is a full code.   TIME SPENT ON ADMISSION: 50 minutes.    ____________________________ Belia Heman. Verdell Carmine, MD vjs:cs D: 02/17/2013 18:02:26 ET T: 02/17/2013 18:31:30 ET JOB#: [id number]  cc: Belia Heman. Verdell Carmine, MD, <Dictator> Henreitta Leber MD ELECTRONICALLY SIGNED 03/06/2013 18:32

## 2014-07-22 NOTE — Consult Note (Signed)
CHIEF COMPLAINT and HISTORY:  Subjective/Chief Complaint Left foot pain   History of Present Illness Pt is a 69 year old African American male who was admitted with left foot pain and diabetic foot ulceration. He reports left foot pain for a few weeks which has been making it difficult to ambulate. For about 1 1/2 weeks he has had edema to the left foot and found to have ulcerations of 4th and 5th toes. He has known PAD and followed at [facility] for this. Denies hx of ulcerations in the past. Denies previous vascular intervention. He had appt with [facility] vascular last week but missed it due to weather. Denies fever, chills, or ulcer drainage. Sees Dr. Vickki Muff. Pt also was put on uloric for gout recently.  According to Dr. Graciela Husbands note last vascular check for patient was Oct-Jan and right circulation 85%, left 50%.   Past History Gout Neuropathy   PAST MEDICAL/SURGICAL HISTORY:  Past Medical History:   high cholesterol;:    PVD:    HTN:    diabetes:   ALLERGIES:  Allergies:  No Known Allergies:   HOME MEDICATIONS:  Home Medications: Medication Instructions Status  hydrochlorothiazide 25 mg oral tablet 1  orally once a day  Active  Vicodin 300 mg-10 mg oral tablet 1-2 tab(s) orally 4 times a day, As Needed Active  Aspir 81 81 mg oral tablet 1 tab(s) orally once a day Active  cilostazol 100 mg oral tablet 1 tab(s) orally 2 times a day Active  gabapentin 300 mg oral capsule  orally 2 times a day Active  simvastatin 20 mg oral tablet 1 tab(s) orally once a day (at bedtime) Active  metformin 500 mg oral tablet 1 tab(s) orally 2 times a day Active  Uloric 40 mg oral tablet 1 tab(s) orally once a day Active  lisinopril 40 mg oral tablet 1 tab(s) orally once a day Active  ranitidine 150 mg oral tablet 1 tab(s) orally 2 times a day Active   Family and Social History:  Family History Hypertension  Diabetes Mellitus   Social History positive  tobacco, negative ETOH, negative Illicit drugs,  almost 1 ppd   Review of Systems:  Fever/Chills No   Cough No   Sputum No   Abdominal Pain No   Diarrhea No   Nausea/Vomiting No   SOB/DOE No   Chest Pain No   Physical Exam:  GEN no acute distress   HEENT moist oral mucosa   NECK supple  No masses   RESP normal resp effort  clear BS   CARD regular rate  no murmur   VASCULAR ACCESS decreased pedal pulses   ABD denies tenderness  no hernia  soft  normal BS   EXTR Mild edema, left ulceration to 4th/5th toes plantar aspect in crease at toe base (no draiange or erythema)   SKIN positive ulcers   PSYCH alert, A+O to time, place, person   LABS:  Laboratory Results: Hepatic:    15-Feb-14 14:24, Hepatic Function Panel A  Bilirubin, Total 0.4  Bilirubin, Direct 0.1  Result(s) reported on 16 May 2012 at 07:26PM.  Alkaline Phosphatase 88  SGPT (ALT) 13  SGOT (AST) 17  Total Protein, Serum 9.1  Albumin, Serum 4.4  Routine Chem:    15-Feb-14 47:65, Basic Metabolic Panel (w/Total Calcium)  Glucose, Serum 180  BUN 22  Creatinine (comp) 1.30  Sodium, Serum 135  Potassium, Serum 3.9  Chloride, Serum 103  CO2, Serum 22  Calcium (Total), Serum 9.7  Anion Gap 10  Osmolality (calc) 278  eGFR (African American) >60  eGFR (Non-African American) 57  eGFR values <59m/min/1.73 m2 may be an indication of chronic  kidney disease (CKD).  Calculated eGFR is useful in patients with stable renal function.  The eGFR calculation will not be reliable in acutely ill patients  when serum creatinine is changing rapidly. It is not useful in   patients on dialysis. The eGFR calculation may not be applicable  to patients at the low and high extremes of body sizes, pregnant  women, and vegetarians.  Routine Hem:    15-Feb-14 14:24, CBC Profile  WBC (CBC) 12.6  RBC (CBC) 3.94  Hemoglobin (CBC) 12.5  Hematocrit (CBC) 38.2  Platelet Count (CBC) 203  Result(s) reported on 16 May 2012 at 02:50PM.  MCV 97  MCH 31.6  MCHC 32.6   RDW 14.3  Segmented Neutrophils 31  Lymphocytes 11  Variant Lymphocytes 50  Monocytes 4  Eosinophil 4  NRBC 1  Diff Comment 1   ANISOCYTOSIS  Diff Comment 2   NORMAL PLT MORPHOLGY   Result(s) reported on 16 May 2012 at 02:50PM.    15-Feb-14 14:24, Sedimentation Rate  Erythrocyte Sed Rate 20  Result(s) reported on 16 May 2012 at 08:52PM.   RADIOLOGY:  Radiology Results: XRay:    15-Feb-14 19:15, Foot Left AP and Lateral  Foot Left AP and Lateral  PRELIMINARY REPORT    The following is a PRELIMINARY Radiology report.  A final report will follow pending radiologist verification.      REASON FOR EXAM:    L foot pain and swelling  COMMENTS:       PROCEDURE: DXR - DXR FOOT LEFT AP AND LATERAL  - May 16 2012  7:15PM     RESULT: Comparison is made to prior study dated 04/27/2012.    Findings: There is no evidence of fracture, dislocation or malalignment.   The bones are osteopenic which may represent a component of hyperemia.    IMPRESSION:      1. No evidence of acute osseous abnormalities. As stated above, the areas   of osteopenia may be secondary to hyperemia. An etiology such as RSD is a   diagnostic consideration. If clinically warranted, further evaluation     with MRI or possibly nuclear medicine bone imaging is recommended.    Thank you for the opportunity to contribute to the care of your patient.         Dictated By: HMikki Santee M.D., MD  [place]    15-Feb-14 22:04, [place] Flow Doppler Low Extrem Bilat (Legs)  [place] Flow Doppler Low Extrem Bilat (Legs)  REASON FOR EXAM:    bilateral lower extremity swelling  COMMENTS:       PROCEDURE: [place] - [place]oreaDOPPLER LOW EXTR BILATERAL  - May 16 2012 10:04PM     RESULT: Technique: Grayscale, duplex, color flow and SPECTRAL waveform   imaging was performed to interrogate the deep venous structures of the   right and left lower extremities.    Findings:    There is no evidence of increased echogenicity, non-  compressibility,    abnormal waveforms nor abnormal color flow within the interrogated deep   venous structures of the right or left lower extremity. There is an   appropriate response to Valsalva and augmentation.  IMPRESSION:      1.  No sonographic evidence of a deep venous thrombus within the right or   left lower extremity.  Verified By: Annie Main. Burt Knack, M.D., MD  LabUnknown:    15-Feb-14 19:15, Foot Left AP and Lateral  PACS Image    15-Feb-14 22:04, [place] Color Flow Doppler Low Extrem Bilat (Legs)  PACS Image   ASSESSMENT AND PLAN:  Assessment/Admission Diagnosis Diabetic left foot infection with PAD. Currently started on antibiotics. Discussed with Dr. Lucky Cowboy. Will likely need angiogram. If schedule allows will plan for procedure Tuesday or Wednesday. If patient is discharged before then we will do an arterial duplex at our office this week and angiogram next week. Discussed the procedure with patient and family, questions answered.  Thank you for this consultation. Will follow.   Electronic Signatures: Su Grand (PA-C)  (Signed 16-Feb-14 13:15)  Authored: Chief Complaint and History, PAST MEDICAL/SURGICAL HISTORY, ALLERGIES, HOME MEDICATIONS, Family and Social History, Review of Systems, Physical Exam, LABS, RADIOLOGY, Assessment and Plan   Last Updated: 16-Feb-14 13:15 by Su Grand (PA-C)

## 2014-07-22 NOTE — Discharge Summary (Signed)
PATIENT NAME:  Manuel Randolph, Manuel Randolph MR#:  [id number] DATE OF BIRTH:  Jul 20, 1945  DATE OF ADMISSION:  05/18/2012 DATE OF DISCHARGE:  05/22/2012  ADDENDUM:  The patient's medication list has been updated and the patient is being discharged home with the following medications: 1.  Vicodin 300/10 mg 1 to 2 tablets 4 times daily as needed.  2.  Aspirin 81 mg p.o. daily. 3.  Cilostazol 100 mg p.o. twice daily.  4.  Gabapentin 300 mg p.o. twice daily.  5.  Simvastatin 10 mg p.o. at bedtime.  6.  Metformin 500 mg p.o. twice daily.  7.  Uloric acid 40 mg p.o. daily.  8.  Ranitidine 150 mg p.o. twice daily.  9.  Lisinopril 40 mg p.o. twice daily.  10.  Metoprolol tartrate 25 mg p.o. twice daily.  11.  Triple antibiotic ointment one application once a day to affected area.  12.  Senna 1 tablet twice daily as needed.  13.  Nicotine oral inhaler 10 mg inhaler every 1 to 2 hours as needed.  14.  Nicotine transdermal patch 21 mg topically daily.  15.  Amoxicillin clavulanate 875/125 mg 1 tablet twice a day.   NOTE: The patient is not going to take Plavix, which was previously ordered for him. ____________________________ Theodoro Grist, MD rv:sb D: 05/25/2012 11:56:45 ET T: 05/25/2012 12:27:49 ET JOB#: [id number]  cc: Theodoro Grist, MD, <Dictator> Halina Maidens, MD [place] MD ELECTRONICALLY SIGNED 06/22/2012 12:48

## 2014-07-22 NOTE — Consult Note (Signed)
EGD showed duodenitis. No active bleeding. Full liquid diet ordered. Advance diet as tolerated. Continue protonix po daily. Hold off on plavix. Recommend colonoscopy as outpt off plavix. Thanks.  Electronic Signatures: Verdie Shire (MD)  (Signed on 20-Nov-14 13:44)  Authored  Last Updated: 20-Nov-14 13:44 by Verdie Shire (MD)

## 2014-07-22 NOTE — Consult Note (Signed)
PATIENT NAME:  Manuel Randolph, Manuel Randolph MR#:  [id number] DATE OF BIRTH:  11-Mar-1946  DATE OF CONSULTATION:  05/17/2012  CONSULTING PHYSICIAN:  Sharlotte Alamo, DPM  REASON FOR CONSULTATION: This is a 69 year old male recently admitted to the hospital with a complaint of pain in his left foot. He has had pain for the last few weeks, getting significantly worse over the last 1-1/2 weeks. He started to notice some swelling. He has ulcerations between the fourth and fifth toes. He was scheduled for evaluation down at [facility] last week but had to miss it due to the weather. He is followed outpatient by Dr. Vickki Muff for his feet. He was recently placed on Uloric for gout.   PAST MEDICAL HISTORY:  Non-insulin-requiring diabetes, peripheral vascular disease, hypertension, hypercholesterolemia.   HOME MEDICATIONS: 1. Hydrochlorothiazide 25 mg daily.  2. Vicodin 10 mg/300 mg 1 to 2 tablets 4 times daily p.r.n. pain.  3. Aspirin 81 mg once daily.  4. Cilostazol 100 mg 1 b.i.d.  5. Gabapentin 300 mg 1 b.i.d.  6. Simvastatin 20 mg once daily at bedtime. 7. Metformin 500 mg twice a day.  8. Uloric 40 mg once a day.  9. Lisinopril 40 mg once a day.  10. Ranitidine 150 mg b.i.d.   ALLERGIES: No known drug allergies.   SOCIAL HISTORY: Long-term smoker. Denies alcohol use.   FAMILY HISTORY: Hypertension and diabetes.   REVIEW OF SYSTEMS: He relates some significant swelling in his left foot over the last week and a half. It has reduced since he has been in the hospital. He has had some drainage between the toes. He does relate some occasional numbness and paresthesias in the feet. He denies any fever or chills. No history of injury. He denies any stomach pain, heartburn, or nausea or vomiting. No shortness of breath or chest pain.  PHYSICAL EXAMINATION: VASCULAR: DP and PT pulses could not clearly be palpated. Capillary filling time appears to be intact.  NEUROLOGICAL: Protective threshold appears to be intact and  symmetric. Proprioception appears to be intact.  INTEGUMENT: Skin is warm, dry, and somewhat atrophic with some mild edema on the left as compared to the right. Absent hair growth. There is some mild drainage between the fourth and fifth toes. I could not clearly assess the ulceration between the toes due to severe pain trying to separate the toes. There does not appear to be any streaking cellulitis from the area.  MUSCULOSKELETAL: Guarded range of motion on the left secondary to pain. Muscle testing is deferred.   ASSESSMENT:  1. Diabetes with peripheral vascular disease.  2. Ulceration, left fourth and fifth toes, at this point does not appear to be severe.   PLAN: A Betadine wet-to-dry dressing was placed between the fourth and fifth toes with a light gauze wrap around the foot. I discussed with the patient that due to the severity of his symptoms in the left foot my concern is that there is some degree of vascular compromise that has gotten worse in the last couple of weeks. He will most likely need vascular exam. At this point, this may be able to be obtained outpatient versus inpatient.  We will order daily dressing changes with Betadine and a light gauze between the fourth and fifth toes and wait on vascular evaluation.   ____________________________ Sharlotte Alamo, DPM tc:cb D: 05/17/2012 15:16:52 ET T: 05/17/2012 15:36:44 ET JOB#: [id number]  cc: Sharlotte Alamo, DPM, <Dictator> Justin A. Vickki Muff, [place] DPM ELECTRONICALLY SIGNED 05/22/2012 14:45

## 2014-07-22 NOTE — Op Note (Signed)
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   PROCEDURES PERFORMED:  1.  Abdominal aortogram.  2.  Left lower extremity distal runoff, third order catheter placement.  3.  Additional third order catheter placement.  4.  Percutaneous transluminal angioplasty, left peroneal artery to 2.5 mm.  5.  Percutaneous transluminal angioplasty, left anterior tibial artery to 3 mm.  6.  ProGlide closure of the right groin site.   SURGEON: Hortencia Pilar, M.D.   SEDATION: Versed 5 mg plus fentanyl 200 mcg administered IV. Continuous ECG, pulse oximetry and cardiopulmonary monitoring is performed throughout the entire procedure by the interventional radiology nurse. Total sedation time is 1 hour, 30 minutes.   ACCESS: 6-French sheath, right common femoral artery.   CONTRAST USED: Isovue 74 mL.   FLUOROSCOPY TIME: 9.9 minutes.   INDICATIONS: The patient is a 69 year old gentleman admitted to the hospital and was found to have ulcerations and gangrene of his left foot. Pulses are nonpalpable on physical examination confirming atherosclerotic occlusive disease and he is, therefore, undergoing angiography with the hope for intervention for limb salvage. The risks and benefits were reviewed. All questions answered. The patient agrees to proceed.   DESCRIPTION OF PROCEDURE: The patient is taken to the special procedure suite, placed in the supine position. After adequate sedation is achieved, the right groin is prepped and draped in a sterile fashion. Lidocaine 1% is infiltrated in the soft tissues and access to the common femoral artery is obtained with ultrasound guidance.  Ultrasound is utilized secondary to lack of appropriate landmarks and to avoid vascular injury. Under direct visualization, the common femoral artery is identified, femoral bifurcation is also localized and the artery is scanned more proximally. The artery is noted to be homogeneous and pulsatile indicating patency. Image is recorded for the permanent record, and the micropuncture needle is used to access the anterior wall under direct visualization. Microwire followed by microsheath, J-wire followed by 5-French sheath and 5-French pigtail catheter. Pigtail catheter is advanced to the level of T12 and AP projection of the aortoiliac system is obtained. Pigtail catheter is repositioned and an RAO projection of the pelvis is obtained. A stiff angled Glidewire and pigtail catheter are used to cross the aortic bifurcation. The catheter is advanced down into the proximal common femoral where an LAO projection of the left groin is obtained. Wire is reintroduced and negotiated into the SFA and the pigtail catheter is negotiated into the proximal one third of the SFA where distal runoff is obtained. Distal images down at the mid calf and ankle are inadequate and ultimately the angled Glidewire, a straight 120 slip catheter and a V18 wire are negotiated into the peroneal where distal peroneal runoff is obtained demonstrating a dominant distal branch to the lateral plantar. V18 wire is reintroduced and a 2.5 x 10 balloon is advanced across the proximal one third, where tandem subtotal occlusions exist. Balloon is inflated to 16 atmospheres for 1 full minute. Followup angiography demonstrates less than 5% residual stenosis and rapid flow of contrast. There is a small dissection, but it is non-flow-limiting.   The wire is then pulled back and negotiated into the anterior tibial where the V18 wire in conjunction with the straight slip catheter  and subsequently an 0.18 CXI angled catheter, are used to negotiate the wire and CXI  catheter all the way down to the dorsalis pedis where hand injection demonstrates patency of the dorsalis pedis with filling of the pedal arch. V18 wire is reintroduced and the 2.5 balloon x 10 balloon is advanced down with the distal marker just distal to the ankle joint. Inflation is performed to 12 atmospheres for 1 minute. A 3 x 30 balloon is then advanced down to the level of the malleoli and the remaining length of the anterior tibial in its entirety is angioplastied to 3 mm. Inflation is for 1 minute to 10 atmospheres. Followup angiography demonstrates two areas of residual stenosis at the level of the ankle joint in the area previously treated with a 2.5 mm balloon. Nitroglycerin 250 mcg was then infused intra-arterially and later another 100 mcg is infused as well. The 2.5 balloon is reintroduced down to this level at the ankle and inflated this time to 20 atmospheres for maximal diameter of 2.7 mm. Followup angiography demonstrates resolution of this area of narrowing. There is now rapid flow of contrast from and SFA injection down to the foot via patent anterior tibial and peroneal filling both the lateral plantar and the pedal arch.   Sheath is pulled into the right external iliac. ProGlide device is used. Initial device does not capture the needle, but the second device captures the needle and yields excellent hemostasis. There are no immediate complications.   INTERPRETATION: The abdominal aorta is opacified with a bolus injection of contrast. There are no hemodynamically significant stenoses. Bilateral nephrograms are noted, normal size. Single renal arteries are identified bilaterally. No evidence of significant renal artery stenosis. The aortic bifurcation is widely patent as is the common iliacs and external iliacs bilaterally. Internal iliacs demonstrate diffuse disease bilaterally, but they are patent.   The right common femoral and visualized portions of the SFA and profunda femoris  demonstrate extensive atherosclerotic changes, but there are no hemodynamically significant focal stenoses.   The left common femoral is patent, again with significant atherosclerotic changes, but no hemodynamically significant disease, and this is true of the superficial femoral and popliteal as well. Profunda femoris is patent; however, just distal to the first set of branched within the main trunk of the profunda, there is fairly extensive disease, which does appear to be hemodynamically significant and on the order of 50 to 65%. There is very poor collateralization of the profunda with the geniculates. The distal popliteal is patent. Initially it is difficult to ascertain which are the tibial vessels and which are collateral vessels; however, the dominant runoff at the time of the initial injection does appear to be the peroneal and therefore, this is the first vessel to be treated as described above. After inflation of a 2.5 mm balloon, there is wide patency of the peroneal down to the ankle filling the lateral plantar. In a similar fashion after intervention within the anterior tibial, there is wide patency of the anterior tibial down into the dorsalis pedis, now filling the pedal arch.   SUMMARY: Successful revascularization with treatment of both the peroneal and the anterior tibial to provide maximal blood flow to the foot.  ____________________________ Katha Cabal, MD ggs:aw D: 05/19/2012 12:30:43 ET T: 05/19/2012 12:46:48 ET JOB#: [id number]  cc: Katha Cabal, MD, <Dictator> Halina Maidens, MD Sharlotte Alamo, [place] MD ELECTRONICALLY SIGNED 05/26/2012 11:23

## 2014-07-22 NOTE — H&P (Signed)
PATIENT NAME:  Manuel Randolph, Manuel Randolph MR#:  [id number] DATE OF BIRTH:  May 29, 1945  DATE OF ADMISSION:  05/17/2012  REFERRING PHYSICIAN: Pollie Friar, MD  PRIMARY CARE PHYSICIAN: Halina Maidens, MD      CHIEF COMPLAINT: Left foot pain.   HISTORY OF PRESENT ILLNESS: This is a 69 year old male with known past medical history of diabetes mellitus, peripheral vascular disease followed at [facility], gout, hypertension, hyperlipidemia, neuropathy, presents with pain in the left foot. The patient has been complaining of the pain for the last month or so; as well, he complains of some edema as well. The patient was seen by his primary care physician where he was started on Uloric for gout without much improvement of his symptoms; as well, the patient recently saw Podiatry Service at [facility], Dr. Vickki Muff, who examined the patient, where he had a concern of peripheral vascular disease as well as venous insufficiency. The patient presents today with significant pain which could not be controlled on his home Percocet. Upon presentation to the ED, the patient was afebrile. The patient had basic work-up done which did show mild leukocytosis of 12.6. As mentioned earlier, the patient was afebrile. On physical exam, the patient was noticed to have left fourth and fifth toe new ulceration with some serous discharge. The patient was started on IV Cipro in the ED, and due to significant pain which could not be controlled with p.o. meds, hospitalist service was requested to admit the patient. The patient denies any recent trauma in his left foot and reports he did order diabetic shoes last week which should be ready in 2 weeks.  The patient denies any chest pain, any shortness of breath, any coffee-ground emesis.   The patient reports he has history of peripheral vascular disease, and he usually follows at [facility] with that. He had an appointment last week, but he missed it because of the weather.  He reports the last time he was seen  there was around October or January when he had some studies done which he reports he has circulation 50% in his left lower extremity and 85% in the right lower extremity. He cannot recall which exact studies he had done.   PAST MEDICAL HISTORY: 1. Diabetes.  2. Peripheral vascular disease.  3. Gout.  4. Hypertension.  5. Hyperlipidemia.  6. Neuropathy.   ALLERGIES: No known drug allergies.   HOME MEDICATIONS: Hydrochlorothiazide, simvastatin, lisinopril, metformin, cilostazol, Neurontin, ranitidine, aspirin and Percocet.   SOCIAL HISTORY: The patient reports he smokes 1 pack per day, reports remote history of alcohol use but reports he quit smoking a few years ago.   FAMILY HISTORY: No history of heart disease or cancer in the family.   REVIEW OF SYSTEMS:  CONSTITUTIONAL: The patient denies fever, chills, fatigue, or weakness.  EYES: Denies blurry vision, double vision, pain, inflammation.  ENT: Denies tinnitus, ear pain, hearing loss, epistaxis or discharge.  RESPIRATORY: Denies cough, wheezing, hemoptysis, dyspnea or pneumonia.  CARDIOVASCULAR: Denies chest pain, arrhythmia, palpitations, syncope. Has mild baseline edema.  GASTROINTESTINAL: Denies nausea, vomiting, diarrhea, abdominal pain, hematemesis, jaundice, rectal bleed, constipation.  GENITOURINARY: Denies dysuria, hematuria, or renal colic.  ENDOCRINE: Denies polyuria, polydipsia, heat or cold intolerance.  HEMATOLOGY: Denies anemia, easy bruising, bleeding diathesis.  INTEGUMENTARY: Denies acne or rash, has a new left foot ulceration.  MUSCULOSKELETAL: Denies any shoulder pain, knee pain, arthritis. Has cramps in the lower extremities and some claudication.  NEUROLOGIC: Denies any dysarthria, epilepsy, tremors, vertigo, ataxia, dementia, CVA, seizures or  memory loss.  PSYCHIATRIC: Denies anxiety, insomnia, bipolar disorder, depression, or schizophrenia.   PHYSICAL EXAMINATION: VITAL SIGNS: Temperature 98.5, pulse 73,  respiratory rate 20, blood pressure 152/81, saturating 100% on room air.  GENERAL: Well-nourished male, looks comfortable and in no apparent distress.  HEENT: Head atraumatic, normocephalic. Pupils are equal, reactive to light. Pink conjunctivae. Anicteric sclerae. Moist oral mucosa.  NECK: Supple. No thyromegaly. No JVD.  CHEST: Good air entry bilaterally. No wheezing, rales or rhonchi.  CARDIOVASCULAR: S1, S2 heard. No rubs, murmurs, or gallops.  ABDOMEN: Soft, nontender, nondistended. Bowel sounds present.  EXTREMITIES: Has some mild bilateral lower extremity pedal edema. No clubbing. No cyanosis. Has nonpalpable pulses, only palpable by Doppler. Skin is thin and shiny. Has mild ulcerative lesion at the inferior area of the fourth and fifth left toes.  NEUROLOGIC: Cranial nerves are grossly intact. Motor 5 out of 5. Sensation symmetrical and intact. Has tenderness to palpation in the left lower extremity.  SKIN: Normal skin turgor, warm and dry. Has some shiny smooth skin in the lower extremity which might indicate some PVD.   PERTINENT LABORATORY AND RADIOLOGICAL DATA:  Glucose 180, BUN 22, creatinine 1.3, sodium 135, potassium 3.9, chloride 103, CO2 22, ALT 13, AST 17, alkaline phosphatase 88. White blood cells 4.6, hemoglobin 12.5, hematocrit 38.2, platelets 203.   Venous Doppler negative for DVT in bilateral lower extremities.   ASSESSMENT AND PLAN: 1. Left lower extremity pain:  This appears to be multifactorial, mainly due to diabetic neuropathy component; as well, there is a known history of some ischemic peripheral vascular disease which might contribute into that as well, but it seems to be the main source of the patient's pain is his fourth and fifth toes where the patient appears to have developed some new foot ulceration with some serous discharge. The patient will be admitted, started on IV Cipro. A foot x-ray was done.  We will await an official reading to rule out osteomyelitis. We  will consult the wound care team.  As well, we will consult the podiatry service. The patient's venous Doppler is negative for deep vein thrombosis.  2. Diabetes mellitus: We will hold metformin. We will have the patient on insulin sliding scale. We will check hemoglobin A1c.  3. Tobacco abuse: The patient was counseled and will be started on Nicoderm patch.  4. Peripheral vascular disease: We will continue with cilostazol. We will consult Vascular Surgery.  5. Gout: We will continue with Uloric.  6. Hypertension:  Continue home medication.  7. Hyperlipidemia: Continue with statin.  8. Neuropathy: Continue with Neurontin.  9. Deep vein thrombosis prophylaxis: Subcutaneous heparin.  10. Gastrointestinal prophylaxis: On ranitidine.   CODE STATUS:  FULL CODE.    TOTAL TIME SPENT ON ADMISSION AND PATIENT CARE: 55 minutes.   ____________________________ Albertine Patricia, MD dse:cb D: 05/17/2012 02:36:12 ET T: 05/17/2012 08:23:51 ET JOB#: [id number]  cc: Albertine Patricia, MD, <Dictator> DAWOOD Graciela Husbands MD ELECTRONICALLY SIGNED 05/18/2012 3:51

## 2014-07-22 NOTE — Discharge Summary (Signed)
PATIENT NAME:  Manuel Randolph, Manuel Randolph MR#:  [id number] DATE OF BIRTH:  [birth year]  DATE OF ADMISSION:  05/18/2012 DATE OF DISCHARGE:  05/22/2012  ADMITTING DIAGNOSES:  1. Left lower extremity pain. 2. Diabetes mellitus.  3. Tobacco abuse. 4. Peripheral vascular disease. 5. Gout. 6. Hypertension. 7. Hyperlipidemia. 8. Neuropathy and deep venous thrombosis prophylaxis and gastrointestinal prophylaxis.   DISCHARGE DIAGNOSES: 1. Peripheral artery disease with gangrene left foot ulcer.  2. Suspected mild cellulitis.  3. Status post aortogram and percutaneous transluminal angioplasty to left peroneal as well as left anterior tibial vessels with improvement of blood flow and the patient's condition and patient's pain.  4. Left lower extremity pain due to peripheral artery disease.  5.  Diabetes mellitus, type 2, hemoglobin A1c 6.4.  6. 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  7. Hypertension. 8. Dehydration.  9. Acute renal failure, resolved on intravenous fluids. 10. Tobacco abuse.   DISCHARGE CONDITION: Stable.   DISCHARGE MEDICATIONS:  1. The patient is to continue Vicodin 10/300, 1 to 2 tablets 4 times daily as needed.  2. Aspirin 81 mg p.o. daily.  3. Cilostazol 100 mg p.o. twice daily. 4. Gabapentin 300 mg p.o. twice daily. 5. Simvastatin 20 mg p.o. at bedtime.  6. Metformin 500 mg p.o. twice daily.  7. Dilaudid 40 mg p.o. daily. 8. Ranitidine 150 mg p.o. twice daily.  9. Lisinopril 40 mg p.o. twice daily. This is the planned dose. 10. Clopidogrel 75 mg p.o. daily. This is a new medication.  11. Metoprolol tartrate 25 mg p.o. twice daily.   12. Bacitracin, neomycin, polymyxin, pramoxine 500 units/3.5 mg/10,000 units/10 mg and 1 gram topical ointment. /3.5 mg/10,000 units/10 mg in 1 gram topical ointment to affected area once a day.  13. Senna 1 tablet twice daily as needed.  14. Nicotine oral inhaler every 1 to 2 hours as needed.  15. Nicotine  topical transdermal film 21 mg topically daily.  16. Amoxicillin clavulanate 875/125 mg 1 tablet twice daily for 7 more days.  17. The patient is not to take hydrochlorothiazide.   DIET: A 2 gram salt, low fat, low cholesterol, carbohydrate-controlled diet, regular consistency.   ACTIVITY LIMITATIONS: As tolerated.   FOLLOWUP APPOINTMENT: Dr. Army Melia in 2 days after discharge.    CONSULTANTS: Dr. Cleda Mccreedy, Podiatry, Dr. Delana Meyer, Vascular Surgery.  PROCEDURES: Abdominal aortogram with runoff and lower left lower extremity percutaneous transluminal angioplasty, 2.5 mm left peroneal and percutaneous transluminal angioplasty to 3 mm, left anterior tibial and local anesthesia with minimal estimated blood loss with no complications on 09/29/1599.   RADIOLOGIC STUDIES: Left foot PA and lateral x-rays on 05/18/2012 showed no evidence of acute osseous abnormalities. There is osteopenia, maybe secondary to her hyperemia. Etiology such as RSD is a diagnostic consideration. If clinically warranted further evaluation with MR and possibly nuclear medicine bone images was recommended. Ultrasound of bilateral lower extremities 05/18/2012 revealed no sonographic evidence of deep vein thrombosis within the right or left lower extremity.   HISTORY OF PRESENT ILLNESS: The patient is a 69 year old African American male with past medical history significant for a history of peripheral artery disease, a history of ongoing tobacco abuse, who presented to the hospital with complaints of left foot pain. Please refer to Dr. Graciela Husbands admission 05/18/2012.  On arrival to the Emergency Room, the patient's temperature was 98.5, pulse was 73, respiration rate was 20, blood pressure was 152/81, saturation was 100% on room air.   Physical exam revealed mild bilateral pedal edema. The patient had  nonpalpable pulses only felt by doctor. The skin was thin and shiny. He had also ulcerative lesion in the inferior area of 4th to 5th  left toes. Otherwise, physical exam was unremarkable.   The patient's lab data done on the day of admission, February 17th, showed the elevated BUN to 22, glucose 180, sodium 135, otherwise BMP was unremarkable. The patient's liver enzymes revealed mild elevation of total protein to 9.1, otherwise liver enzymes were normal. White blood cell count was elevated to 12.6, hemoglobin was 12.5, platelet count 203. The patient was admitted to the hospital. He was evaluated by Dr. Cleda Mccreedy, Podiatry, who felt the patient had diabetes with peripheral vascular disease as well as ulceration of his left 4th to 5th toes and which did not look severe. It was recommended at that time wet-to-dry dressing between the 4th and 5th toes with a light gauze wrap around the foot. He also discussed with the patient that due to the severity of these symptoms, Dr. Cleda Mccreedy was concerned that that the patient has some degree of vascular compromise, which could have gotten worse in the last couple of weeks, and for this reason, he recommended vascular evaluation. Dr. Delana Meyer saw the patient in consultation in the next day or so and proceeded to a vascular procedure on 05/19/2012. The patient underwent arthrogram with runoff of lower extremity and PTA was placed at the left peroneal as well as left AT vessel. There was good postoperative blood flow. Postprocedure, the patient felt satisfactory. His pain abated and his wound seemed to be healing. The patient is to continue current pain management with Vicodin. He is to continue also cilostazol as well as aspirin and Plavix for his peripheral artery disease. He is to continue amoxicillin for 7 more days to complete antibiotic course. He is to have a triple-antibiotic ointment placed between his toes and he is to follow up with his podiatrist, Dr. Cleda Mccreedy, in the next few days after discharge as well as Wound Clinic in the next few days after discharge. He is to follow up with his primary care  physician, Dr. Army Melia, for podiatric followup appointment.   For diabetes mellitus type 2, hemoglobin A1c was found to be 6.4. It was felt that the patient's diabetes was quite well controlled.   The patient was noted to have some lymphoproliferative disorder, as based on abnormal differential. Apparently the patient's RBCs were found to be normal on smear but platelets varied in size and toxic vacuolization was noted. Also there were variable  lymphocyte forms. The patient would benefit from evaluation by oncologist/hematologist as outpatient. The patient's white blood cell count remained high despite antibiotic therapy. On 05/20/2012, the patient's white blood cell count was 14.4. It is recommended to follow the patient's CBC as outpatient and make decisions about Oncology-Hematology consultation as outpatient since it was felt that the patient does have some abnormalities on his differential including  abnormal forms of lymphocytes, hence the patient would be a candidate for evaluation by Oncology and Hematology.   In regard to dehydration, as well as acute renal failure, initially on arrival to the hospital the patient was noted to be dehydrated. His kidney function was also compromised. On 05/18/2012 the patient's BUN and creatinine were elevated to 20 and 1.42. On the 05/19/2012, the patient's BUN and creatinine were 23 and 1.51. However, with IV fluid administration, the patient's creatinine improved. On the 19th of February 2014 it was 17 and 1.25. It is recommended to follow the patient's creatinine  levels as outpatient especially now since he is on high doses of ACE inhibitors.   For hypertension, the patient's blood pressure medications were advanced and the patient's blood pressure was much better controlled. On the day of discharge, 05/22/2012, the patient's blood pressure is still fluctuating between 837G to 902X systolic and 11B to 52C diastolic. His temperature is 98.3, pulse was 60,  respiratory rate was 18, pulse oximetry 100% on room air at rest.   The patient was counseled about tobacco abuse and it was recommended to quit and nicotine replacement therapy was initiated for him upon discharge. The patient is being discharged in stable condition with the above-mentioned medications and followup.   TIME SPENT: 40 minutes.   Of noted other studies which were performed in the hospital include CPR, which was 0.73. Also ESR rate was checked and was found to be 20. No further investigations in regards to underlying inflammation was made. No superficial wound cultures were also taken while in the hospital. It is recommended to check the patient's wound clinically and make decisions about changing antibiotics if needed.    ____________________________ Theodoro Grist, MD rv:jm D: 05/22/2012 19:10:00 ET T: 05/23/2012 14:50:08 ET JOB#: [id number]  cc: Halina Maidens, MD Theodoro Grist, MD, <Dictator> [place] MD ELECTRONICALLY SIGNED 06/23/2012 15:47

## 2014-07-22 NOTE — Discharge Summary (Signed)
PATIENT NAME:  Manuel, Randolph MR#:  [id number] DATE OF BIRTH:  [birth year]  DATE OF ADMISSION:  02/17/2013 DATE OF DISCHARGE:  02/19/2013  PRESENTING COMPLAINT: Presyncopal episode.   DISCHARGE DIAGNOSES:  1.  Presyncope secondary to symptomatic anemia, improved.  2.  Symptomatic anemia due to slow gastrointestinal bleed, suspected from duodenitis in the setting of the patient being on aspirin,  Plavix and Pletal.  3.  Hypertension.  4.  Type 2 diabetes.   PROCEDURES: Esophagogastroduodenoscopy showed duodenitis.  CODE STATUS: Full code.   MEDICATIONS:  1.  Simvastatin 20 mg at bedtime.  2.  Metformin 500 mg b.i.d.  3.  Ranitidine 150 mg b.i.d.  4.  Lisinopril 40 mg b.i.d.  5.  Metoprolol tartrate 25 mg b.i.d.  6.  Acetaminophen/oxycodone 325/5 mg 1 tablet q.4 hours p.r.n.  7.  Gabapentin 300 mg b.i.d.  8.  Senna laxative 8.6 mg 1 tablet b.i.d. as needed.  9.  Protonix 40 mg b.i.d.  10. The patient advised to stop the aspirin, Plavix and cilostazol due to gastrointestinal bleed until further notice.   DIET: Low sodium and carbohydrate-controlled diet.   FOLLOWUP: With Dr. Candace Cruise in 1 to 2 weeks. Follow up with Dr. Army Melia in 2 to 4 weeks.  The patient advised your colonoscopy is scheduled for 11/24.   LABS: Hemoglobin at discharge was 8.2. UA negative for UTI. Chest x-ray: Low volume with underlying chronic interstitial coarsening. Creatinine 1.35, sodium 140. PT-INR is 15.4 and 1.2.   BRIEF SUMMARY OF HOSPITAL COURSE: Manuel Randolph is 69 year old gentleman with history of diabetes, hypertension, hyperlipidemia and history of peripheral vascular disease, diabetic neuropathy, history of [name], who presents to the hospital due to melanotic maroon stools an alterative presyncopal episode. He was admitted with:  1.  GI bleed, which appears to be slow upper GI bleed given the patient is melena and maroon stools. The patient is on aspirin and Plavix, which were held. He received 3 units of  blood transfusion. Hemoglobin is down to 8.2. No maroon stools were noted in the hospital. The patient was seen by Dr. Candace Cruise. 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 The patient is supposed to get colonoscopy on November 24, Monday, by Dr. Candace Cruise. He will follow up with him as outpatient.  2.  Presyncope secondary to symptomatic anemia. No evidence of neurocardiogenic syncope. Hold any further workup at this time since the patient is improved.  3.  Type 2 diabetes. The patient was placed on sliding scale insulin. His home meds were resumed at discharge.  4.  History of peripheral vascular disease. The patient was continued on statins. He is recommend to hold off on his Pet[place], aspirin and Plavix given his GI bleed until further notice.  5.  Diabetic neuropathy. Continue Neurontin.  6.  Acute renal failure, mild secondary to volume loss and GI bleed. Received IV fluids. Hospital stay otherwise remained stable.   CODE STATUS: The patient remained a full code.   TIME SPENT: 40 minutes.  ____________________________ Hart Rochester Posey Pronto, MD sap:aw D: 02/22/2013 07:07:47 ET T: 02/22/2013 07:34:15 ET JOB#: [id number]  cc: Chavon Lucarelli A. Posey Pronto, MD, <Dictator> Lupita Dawn. Candace Cruise, MD Halina Maidens, MD Ilda Basset MD ELECTRONICALLY SIGNED 02/22/2013 14:18

## 2014-07-22 NOTE — Consult Note (Signed)
PATIENT NAME:  Manuel Randolph, Manuel Randolph MR#:  [id number] DATE OF BIRTH:  23-Jan-1946  DATE OF CONSULTATION:  02/18/2013  CONSULTING PHYSICIAN:  Lupita Dawn. Helaina Stefano, MD  REASON FOR REFERRAL: Melena and significant anemia.   DESCRIPTION: The patient is a 69 year old black male who had a large dark stool yesterday morning. He did not feel sick then, but then he went to a financial office later that day. In the office, he had a presyncopal episode where he almost blacked out, but never completely lost his consciousness. He was diaphoretic. EMS was called, and he was brought to the Emergency Room, where he was found to have systolic blood pressure in the 80s. His hemoglobin was only 7.3. His previous hemoglobin was 10. Apparently, until yesterday, he felt fine. He did not have any prior hematochezia, melena, nausea, vomiting, indigestion or heartburn. The patient was given IV fluids, and then his hemoglobin fell to 5.9. The patient is currently getting 2 units of blood right now. He is feeling much better. He has not had any further bleeding since being admitted.   REVIEW OF SYMPTOMS: There are no changes. Please review the initial review of symptoms dictated by Dr. Verdell Carmine.   PAST MEDICAL HISTORY: Notable for diabetes, hypertension, hyperlipidemia, history of peripheral vascular disease and diabetic neuropathy. He also has a history of [name].   ALLERGIES: He has no known drug allergies.   SOCIAL HISTORY: He smokes a pack a day for the past 40 years or so. 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   FAMILY HISTORY: Notable for congestive heart failure and brain tumor.   CURRENT MEDICATIONS: Tylenol with oxycodone, baby aspirin, Plavix 75 mg daily. He also takes gabapentin, lisinopril, metformin, metoprolol, Zantac and Senokot as needed. He also takes Zocor at bedtime.   PHYSICAL EXAMINATION:  VITAL SIGNS: Currently, he is afebrile. His vital signs are stable.  HEAD AND NECK: Within normal limits.   CARDIAC: Regular rhythm and rate. There is a systolic murmur.  LUNGS: Clear bilaterally.  ABDOMEN: Showed normoactive bowel sounds, soft, nontender throughout. There is no hepatomegaly.  EXTREMITIES: No clubbing, cyanosis or edema.  NEUROLOGIC: Nonfocal.  SKIN: Negative.   LABORATORY DATA: Initial hemoglobin was 7.3, now it is 5.9 from last night, white count was 21.9. Electrolytes are normal except for creatinine is 1.46, glucose 116. Liver enzymes are normal. Troponin level is normal. INR is 1.2. UA is negative.   ASSESSMENT AND PLAN: This is a patient at risk for acute gastrointestinal bleeding. There is no active bleeding right now. We have to rule out upper gastrointestinal bleeding. There is no prior history of ulcers. Nevertheless, with aspirin and Plavix, he can bleeding anywhere internally. I recommend to at least do an upper endoscopy today to find the source of bleeding. If there are bleeding ulcers, the area can be cauterized. The aspirin and Plavix were held. Will need to continue to monitor hemoglobin. At some point, he will need a colonoscopy off the Plavix because he has never had a colonoscopy before.   ____________________________ Lupita Dawn. Candace Cruise, MD pyo:lb D: 02/18/2013 13:22:56 ET T: 02/18/2013 13:34:43 ET JOB#: [id number]  cc: Lupita Dawn. Candace Cruise, MD, <Dictator> Lupita Dawn Amberli Ruegg MD ELECTRONICALLY SIGNED 02/18/2013 14:26

## 2014-07-22 NOTE — Consult Note (Signed)
Pt seen and examined. Full consult to follow. Dark stool yest AM. Presyncopal episode yest at an office.. Had moonshine on Fri night. Smokes 1ppd. No prior endoscopies. On ASA/plavix.  Initial Hgb 7.3. Dropped to 5.9 overnight. Getting his 2nd unit of blood now. Feels better. NPO. ASA/plavix on hold. Will plan EGD today. Will eventually need colonoscopy off plavix. Thanks   Electronic Signatures: Verdie Shire (MD) (Signed on 20-Nov-14 07:45)  Authored   Last Updated: 20-Nov-14 07:46 by Verdie Shire (MD)

## 2014-07-23 NOTE — Discharge Summary (Signed)
PATIENT NAME:  Manuel Randolph, Manuel Randolph MR#:  [id number] DATE OF BIRTH:  [birth year]  DATE OF ADMISSION:  04/18/2013 DATE OF DISCHARGE:  04/22/2013  CONSULTANT:  Arther Dames, M.D.   REASON FOR ADMISSION: Lower gastrointestinal bleeding.   OTHER DIAGNOSES:  1.  Acute blood loss anemia requiring transfusions due to gastrointestinal bleed.  2.  Pneumonia readings on x-rays.  3.  Hyperkalemia.  4.  Borderline low blood sugars.  5.  Type 2 diabetes.  6.  Hypertension.  7.  Hyperlipidemia.   DISPOSITION: Home.  MEDICATIONS AT DISCHARGE:  1.  Simvastatin 20 mg once a day. 2.  Metformin 500 mg twice daily. 3.  Lisinopril 40 mg twice daily. 4.  Metoprolol 25 mg twice daily. 5.  Gabapentin 300 mg 4 times a day. 6.  Aspirin 81 mg once a day, hold for 14 days until authorized by Dr. Rayann Heman. 7.  Plavix 75 mg once a day, hold for 14 days until authorized by Dr. Rayann Heman. 8.  Amlodipine 5 mg once a day. 9.  Protonix 40 mg twice daily. 10.  Levaquin 500 mg once a day for 5 days.   HOSPITAL COURSE: A 69 year old gentleman with history of type 2 diabetes, noninsulin dependent diabetes, hypertension, hyperlipidemia, peripheral vascular disease and [name].    The patient presented to the Emergency Department with GI bleeding back in November 2014 secondary to duodenitis after a positive endoscopy and negative colonoscopy. On the day of admission, the patient presented with 3 bowel movements with dark blood mixed with stool, without any significant abdominal pain. The patient did not have any further episodes in the Emergency Department, and his vitals were stable. He was found to have a potassium of 5.5; slightly hemolyzed. His hemoglobin was 8.5 with a platelet count of 105. He was slightly tachycardic with a heart rate of 105. Due to his intestinal bleeding, the patient was put on a Protonix drip, hold aspirin and Plavix and transfusion was ordered if his hemoglobin was below 7. A CBC was drawn every 6 hours. Dr.  Arther Dames was consulted for GI.  PROBLEMS:    1.  Gastrointestinal bleeding. The patient had a Protonix drip and he was kept n.p.o. His aspirin and Plavix were held. The patient had a transfusion of packed red blood cells, so he had acute  blood loss anemia. Capsule endoscopy was ordered, as the patient has history of having a previous endoscopy in the past, colonoscopy as well. The colonoscopy did not show any significant source of active bleeding, for what a bleeding scan would be indicated if the patient continues to have bleeding, although if the bleeding stopped, for what the bleeding scan was not of use at that moment. As a possible source, Dr. Rayann Heman described possible small angiectasias in the distal ileum seen on the capsule. He was okay with discharging the patient. Will need colonoscopy if further bleeding or drop of hemoglobin. The patient, at that moment, was stable. He was started on a diet, for what he was discharged.   As far as his other medical problems, for his peripheral vascular disease, the patient is not allowed to take aspirin or Plavix for now, until at least the next 2 weeks. The patient needs to see Dr. Rayann Heman prior to starting to take that medication. CT scan of the abdomen and pelvis was done, and it showed evidence of pneumonia, for what the patient was started on Levaquin.  The patient did not really show major signs of pneumonia, other  than slight cough, although the patient had a current cough. The patient also had hypertension, which was well controlled.   The patient was discharged in good condition, without any significant problems. Recommended to have a rich iron diet to help with his anemia. Follow up with Dr. Halina Maidens and Dr. Arther Dames in the next 1 to 2 weeks.   time spend 35 min ____________________________ [place] Sink, MD rsg:mr D: 04/25/2013 07:46:00 ET T: 04/25/2013 16:35:07 ET JOB#: [id number]  cc: Halina Maidens, MD Arther Dames,  MD Cristi Loron MD ELECTRONICALLY SIGNED 04/30/2013 6:43

## 2014-07-23 NOTE — Consult Note (Signed)
Details:   - GI follow up:  S: no comlaints, want to go home.  NO bleeding. No f/c, no abd pain  O: vss, a and o x4 cta, no w/c rrr, no m/r/g soft, nabs, nt,nd  Labs:  Hgb stable at 8.3 today.   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  - ok with d/c today or tomorrow since no bleeding and hgb stable.  - will need colonoscopy if futher bleeding or drop in Hgb - I will schedule clinic appt   Lab Results:  Routine Hem:  22-Jan-15 05:16   WBC (CBC)  18.2  RBC (CBC)  2.78  Hemoglobin (CBC)  8.3  Hematocrit (CBC)  25.8  Platelet Count (CBC)  107  MCV 93  MCH 29.9  MCHC 32.2  RDW  16.5  Neutrophil % 26.1  Lymphocyte % 66.1  Monocyte % 5.3  Eosinophil % 2.2  Basophil % 0.3  Neutrophil # 4.7  Lymphocyte #  12.0  Monocyte # 1.0  Eosinophil # 0.4  Basophil # 0.1 (Result(s) reported on 22 Apr 2013 at 08:47AM.)   Electronic Signatures: Arther Dames (MD)  (Signed 22-Jan-15 17:29)  Authored: Details, Lab Results   Last Updated: 22-Jan-15 17:29 by Arther Dames (MD)

## 2014-07-23 NOTE — Consult Note (Signed)
Brief Consult Note: Diagnosis: GI bleed.   Patient was seen by consultant.   Consult note dictated.   Recommend to proceed with surgery or procedure.   Orders entered.   Comments: GI bleed.   recent EGD and colon 01/2013 without source.  Elev WBC is unusual.  Could this be ischemic colitis ?   will plan for capsule endo since he had recent EGD and colon.  clears today.  Miralax prep in am and capsule tomorrow. - CT if capsule study negative to eval for ischemic colitis.  Electronic Signatures: Arther Dames (MD)  (Signed 18-Jan-15 17:10)  Authored: Brief Consult Note   Last Updated: 18-Jan-15 17:10 by Arther Dames (MD)

## 2014-07-23 NOTE — Consult Note (Signed)
Details:   - GI follow up  S: no futher bleeding today.  no n/v, abd pain f/c.  wants to go home  Exam: vss cta, no w/c rrr, no m/r/g soft, nabs, nt  Labs:  Hgb stable today.  Capsule read today: no blood in stomach or small bowel. Possible small angioectasia in distal small bowel.    A/P:  1.) LGIB: no bleeding since yesterday, Hgb stable.  - if further bleeding or drop in Hgb, will plan for colonoscopy to treat distal small bowel AVM. - if no further bleeding and Hgbs stable, like home tomorrow.  with outpt f/u.   Lab Results:  Routine Chem:  21-Jan-15 06:27   Result Comment CBC - SMEAR SCANNED  Result(s) reported on 21 Apr 2013 at 08:33AM.  Glucose, Serum  144  BUN 7  Creatinine (comp) 1.15  Sodium, Serum 141  Potassium, Serum 4.1  Chloride, Serum  114  CO2, Serum 21  Calcium (Total), Serum 8.7  Anion Gap  6  Osmolality (calc) 282  eGFR (African American) >60  eGFR (Non-African American) >60 (eGFR values <56m/min/1.73 m2 may be an indication of chronic kidney disease (CKD). Calculated eGFR is useful in patients with stable renal function. The eGFR calculation will not be reliable in acutely ill patients when serum creatinine is changing rapidly. It is not useful in  patients on dialysis. The eGFR calculation may not be applicable to patients at the low and high extremes of body sizes, pregnant women, and vegetarians.)  Routine Hem:  21-Jan-15 06:27   WBC (CBC)  15.0  RBC (CBC)  2.72  Hemoglobin (CBC)  8.2  Hematocrit (CBC)  24.6  Platelet Count (CBC)  92  MCV 91  MCH 30.0  MCHC 33.2  RDW  16.3  Neutrophil % 31.8  Lymphocyte % 57.4  Monocyte % 7.2  Eosinophil % 2.9  Basophil % 0.7  Neutrophil # 4.8  Lymphocyte #  8.6  Monocyte #  1.1  Eosinophil # 0.4  Basophil # 0.1   Electronic Signatures: RArther Dames(MD)  (Signed 21-Jan-15 17:18)  Authored: Details, Lab Results   Last Updated: 21-Jan-15 17:18 by RArther Dames(MD)

## 2014-07-23 NOTE — Consult Note (Signed)
PATIENT NAME:  Manuel Randolph, HUGE MR#:  [id number] DATE OF BIRTH:  Jan 24, 1946  DATE OF CONSULTATION:  04/18/2013  CONSULTING PHYSICIAN:  Arther Dames, MD  REFERRING PHYSICIAN:  Dr. Valentino Nose  REASON FOR CONSULTATION:  Lower GI bleed.  HISTORY OF PRESENT ILLNESS: [name] is a 69 year old male with a history notable for diabetes, [name], hypertension, peripheral vascular disease, who is presenting to the hospital for evaluation of lower GI bleeding. [name] reports that starting yesterday he had several episodes of stool with bright red blood mixed in. His last bowel movement, he did have one episode of this this morning.   [name] does have a history of lower GI bleeds. He was admitted to the hospital in November 2014, and at that time had an upper endoscopy that only showed some gastritis. He then went on to have a colonoscopy as an outpatient in late November. This study was completely normal, except for external hemorrhoids. Based on that, the plan was to start him back on aspirin and Plavix, which he is currently on.   Now currently, he is re-presenting for similar symptoms to the end of November. Other than the bleeding, he does not report any other symptoms such as abdominal pain, weight loss or dysphagia. He does not have any family history of GI malignancy.   PAST MEDICAL HISTORY: 1.  Type 2 diabetes.  2.  Hypertension.  3.  Hyperlipidemia.  4.  Peripheral vascular disease.  5.  [name].   ALLERGIES: No known drug allergies.  MEDICATIONS:  1.  Aspirin 81 daily.  2.  Plavix 75 mg daily.  3.  Gabapentin 300 mg 4 times a day.  4.  Lisinopril 40 mg b.i.d.  5.  Metformin 500 mg b.i.d.  6.  Metoprolol 25 mg b.i.d.  7.  Simvastatin 20 mg at bedtime.   SOCIAL HISTORY: He is an ongoing smoker. He denies alcohol or recreational drugs.   FAMILY HISTORY: He denies to me any family history of colon cancer or other GI malignancies.  REVIEW OF SYSTEMS:   CONSTITUTIONAL: No weight gain  or weight loss.  No fever or chills. HEENT: No oral lesions or sore throat. No vision changes. GASTROINTESTINAL: See HPI.  HEME/LYMPH: No easy bruising or bleeding. CARDIOVASCULAR: No chest pain or dyspnea on exertion. GENITOURINARY: No hematuria. INTEGUMENTARY: No rashes or pruritus PSYCHIATRIC: No depression/anxiety.  ENDOCRINE: No heat/cold intolerance, no hair loss or skin changes. ALLERGIC/IMMUNOLOGIC: Negative for hives. RESPIRATORY: No cough, no shortness of breath.  MUSCULOSKELETAL: No joint swelling or muscle pain.  PHYSICAL EXAMINATION: VITAL SIGNS: Temperature is 98.9, pulse is 62, respirations are 16, blood pressure is 129/71, pulse ox is 100% on room air.  GENERAL: Alert and oriented times 4.  No acute distress. Appears stated age. HEENT: Normocephalic/atraumatic. Extraocular movements are intact. Anicteric. NECK: Soft, supple. JVP appears normal. No adenopathy. CHEST: Clear to auscultation. No wheeze or crackle. Respirations unlabored. HEART: Regular. No murmur, rub, or gallop.  Normal S1 and S2. ABDOMEN: Soft, nontender, nondistended.  Normal active bowel sounds in all four quadrants.  No organomegaly. No masses EXTREMITIES: No swelling, well perfused. SKIN: No rash or lesion. Skin color, texture, turgor normal. NEUROLOGICAL: Grossly intact. PSYCHIATRIC: Normal tone and affect. MUSCULOSKELETAL: No joint swelling or erythema.   LABORATORY DATA:  Sodium is 141, potassium 4.9, BUN 31, creatinine 1.35. His albumin is 2.9, otherwise liver enzymes are normal. White count is 22.6, hemoglobin is 8.5, hematocrit is 26, MCV is 90.   ASSESSMENT AND  PLAN:  1.  Lower gastrointestinal bleed: It does not seem as though he has lost a significant amount of blood, given his hemoglobin on presentation, which went down to 6.3 during this hospital admission. It is important to note that he has had an EGD and a colonoscopy back in November 2014 only showing some duodenitis with a normal  colonoscopy, except for external hemorrhoids.   Given that he has had such recent studies, I do not plan on repeating these tests at this time. Would I would like to do instead is go ahead with a capsule endoscopy, since this has not been done yet and we do not have a good source for the bleeding.   In addition, it is a little bit surprising that his white count is 22.6. I do not have a great explanation for this, other than if this could possibly be ischemic colitis that could account for an elevated white count. To investigate this, it would be reasonable to perform a CT scan of the abdomen and pelvis to evaluate for evidence of colonic ischemia. The other option would be to wait and see if the white count and the symptoms continue to improve. Nonetheless, I will go ahead with a capsule endoscopy to evaluate for a small bowel source of the bleeding.   In addition, in the meantime, we can continue to monitor his hemodynamics and hemoglobin. If he were to develop evidence of active bleeding, which it does not appear that he has at this time, would also recommend going ahead with a tagged RBC scan.   Thank you for this consult.    ____________________________ Arther Dames, MD mr:mr D: 04/18/2013 15:03:40 ET T: 04/18/2013 20:16:26 ET JOB#: [id number]  cc: Arther Dames, MD, <Dictator> Mellody Life MD ELECTRONICALLY SIGNED 04/20/2013 19:53

## 2014-07-23 NOTE — Consult Note (Signed)
GI follow up some futher bleeding today.  Dark in color, suggestive of upper or small bowel bleedwants solid food.  NO abd pain, n/v, f/c a and o x 4no w/cno m/r/gsoft, nd,nt 8.3 this am after transfusion GI bleed.  Suspect small bowel source. repeat Hgb nowcapusle malfunctioned so no imagesanother capsule swallowed today at 2 pmcleaers starting 6 pm. tagged rbc scan if evidence of active bleeding.    Electronic Signatures: Arther Dames (MD)  (Signed on 20-Jan-15 19:13)  Authored  Last Updated: 20-Jan-15 19:13 by Arther Dames (MD)

## 2014-07-23 NOTE — H&P (Signed)
PATIENT NAME:  Manuel Randolph, KNOBLOCK MR#:  [id number] DATE OF BIRTH:  [birth year]  DATE OF ADMISSION:  04/17/2013  REFERRING PHYSICIAN: Dr. Reita Cliche.   PRIMARY CARE PHYSICIAN: Dr. Halina Maidens.   CHIEF COMPLAINT: GI bleed.  HISTORY OF PRESENT ILLNESS:  A 69 year old African-American gentleman with past medical history of type 2 diabetes non-insulin-requiring, hypertension, hyperlipidemia, peripheral vascular disease, as well as [name] presented with GI bleed. Of note, recent GI bleed back in November of 2014, presumably from duodenitis after positive endoscopy, negative colonoscopy. He presented today after 3 bowel movements with dark blood mixed with stool. He denies any abdominal pain, shortness of breath, palpitations, chest pain; once again, 3 bowel movements with dark blood mixed in stool. Denies any bright red blood. Denies any abdominal pain. No further episodes of bleeding in the Emergency Department. Vital signs stable.    REVIEW OF SYSTEMS:  CONSTITUTIONAL: Denies fever, fatigue, weakness.  EYES:  Denies blurred vision, double vision, eye pain.  EARS, NOSE, THROAT: Denies tinnitus, ear pain, hearing loss.  RESPIRATORY: Denies cough, wheeze, shortness of breath.  CARDIOVASCULAR: Denies chest pain, palpitations, edema.  GASTROINTESTINAL: Denies nausea, vomiting, diarrhea, abdominal pain. Positive for dark red blood, positive for melena.  GENITOURINARY: Denies dysuria, hematuria.  ENDOCRINE: Denies nocturia or thyroid problems.   HEMATOLOGIC AND LYMPHATICS: Denies easy bruising. Positive for bleeding.  SKIN: Denies rash or lesions.  MUSCULOSKELETAL: Denies pain in neck, back, shoulders, knees, hips, arthritic symptoms.  NEUROLOGIC: Denies paralysis, paresthesias. PHYSCHIATRIC: Denies anxiety or depressive symptoms.  Otherwise, full review of systems performed by me is negative.   PAST MEDICAL HISTORY: Type 2 diabetes, non-insulin-requiring, hypertension, hyperlipidemia, peripheral vascular  disease, [name].   SOCIAL HISTORY: Positive for tobacco use. Denies alcohol or drug use.  FAMILY HISTORY:  Positive for congestive heart failure.   ALLERGIES: No known drug allergies.   HOME MEDICATIONS: Aspirin 81 mg p.o. daily, Plavix 75 mg p.o. daily, gabapentin 300 mg p.o. 4 times daily, lisinopril 40 mg p.o. b.i.d., metformin 500 mg p.o. b.i.d., metoprolol 25 mg p.o. b.i.d., simvastatin 20 mg p.o. at bedtime,   PHYSICAL EXAMINATION: VITAL SIGNS: Temperature 98, heart rate 54, respirations 20, blood pressure 134/51, satting 39% on room air. Weight 65.8 kg, BMI 22.1.  GENERAL: Well-nourished, well-developed,  African-American gentleman currently in no acute distress.  HEAD: Normocephalic, atraumatic.  EYES: Pupils are equal, round, reactive light. Extraocular muscles intact. No scleral icterus.  MOUTH: Moist mucous membranes. lower dentures are intact. No abscess noted.  EARS, NOSE AND THROAT: Throat clear without exudate. No external lesions.  NECK: Supple. No thyromegaly. No nodules. No JVD.  PULMONARY: Clear to auscultation bilaterally. No wheezes, rubs or rhonchi. No use of accessory muscles. Good respiratory effort.  CARDIOVASCULAR: S1, S2, 2/6, systolic ejection murmur, best heard at right upper sternal border. No edema. Pedal pulses 2+ bilaterally.  GASTROINTESTINAL: Soft, nontender, nondistended. No masses, positive bowel sounds, no hepatosplenomegaly.  RECTAL: Dark blood at rectum, as well as external hemorrhoids, nonbleeding.  MUSCULOSKELETAL: No swelling, clubbing or edema. Range of motion full in all four extremities.  NEUROLOGIC: Cranial nerves II through XII intact. No gross neurological deficits. Sensation intact.  SKIN: No ulcerations, lesions, rash or cyanosis. Skin warm, dry. Turgor is intact.  PSYCHIATRIC: Mood and affect within normal limits. The patient is awake, alert, and oriented x 3. Insight and judgment intact.   LABORATORY DATA: Sodium 139, potassium 5.5 slight  hemolysis, chloride 113, bicarb 22, BUN 32, creatinine 1.03 glucose 120. LFTs: Total protein 5.8,  albumin 2.9; remainder within normal limits.  WBC 21.4, hemoglobin 8.5, platelets of 105. EKG revealing sinus bradycardia without ST or T wave abnormalities.   ASSESSMENT AND PLAN:  A 69 year old African-American gentleman with a history of diabetes, hypertension, chronic lymphocytic leukemia, presenting with a gastrointestinal bleed. Recent gastrointestinal bleed back in November 2014 secondary to duodenitis. 1.  Gastrointestinal bleed; likely upper in etiology, since restarting aspirin and Plavix. Will start protonic drip. Hold aspirin and Plavix. Transfusion if hemoglobin less than 7. CBC q.6 hours. Consult gastroenterology, Dr. Candace Cruise, who this patient is well-known to. 2.  Hyperkalemia; slight hemolysis, we will repeat BMP.  3. Type 2 diabetes.  Will hold oral agents. Add sliding scale insulin subcutaneous, q.6 hour Accu-Cheks.  4.  Hypertension. Continue with Lopressor.  5.  Hyperlipidemia: Continue with statin therapy.  6. Deep venous thrombosis prophylaxis. Provided with sequential compression devices. Will avoid heparin given #1.  7.  The patient is full code.  Total time spent: 45 minutes.    ____________________________ Aaron Mose. Hower, MD dkh:NTS D: 04/17/2013 23:36:05 ET T: 04/18/2013 00:11:25 ET JOB#: [id number]  cc: Aaron Mose. Hower, MD, <Dictator> DAVID Woodfin Ganja MD ELECTRONICALLY SIGNED 04/19/2013 1:10

## 2014-07-24 NOTE — Op Note (Signed)
PATIENT NAME:  Manuel Randolph, Manuel Randolph MR#:  [id number] DATE OF BIRTH:  26-Aug-1945  DATE OF PROCEDURE:  07/29/2011  PREOPERATIVE DIAGNOSIS: Cataract, right eye.   POSTOPERATIVE DIAGNOSIS: Cataract, right eye.   PROCEDURE PERFORMED: Extracapsular cataract extraction using phacoemulsification placement of an Alcon [id number] 16.5-diopter posterior chamber lens, serial number [id number].   SURGEON: Loura Back. Jenafer Winterton, MD  ANESTHESIA: 4% lidocaine, 0.75% Marcaine 50-50 mixture with 10 units/mL of Hylenex given as a peribulbar.   ANESTHESIOLOGIST: Dr. Phineas Douglas.   COMPLICATIONS: Zonular rupture requiring vitrectomy.   DESCRIPTION OF PROCEDURE: Patient was brought to the Operating Room and given IV sedation and peribulbar block. He was prepped and draped in the usual fashion. Vertical rectus muscles were imbricated using 5-0 silk sutures, bridle sutures and limbal peritomy was carried out for one clock hour superiorly and hemostasis obtained with cautery. A partial thickness scleral groove was made at the posterior surgical limbus superiorly. Anterior chamber was entered superonasally through clear cornea with a paracentesis knife. Anterior chamber was entered with a 2.6 mm keratome and DisCoVisc was used to replace the aqueous. A continuous tear circular capsulorrhexis was carried out. At that time it was noted that there appeared to be loose zonules nasally. It was hard to see how large the area was. Phacoemulsification was carried out in a divide and conquer technique. During the phacoemulsification it was noted that the zonulysis was large and a capsular tension ring was placed into the eye. Phacoemulsification was carried on but the area of zonulysis was too large for the capsule retention ring to hold it in place. Therefore the rest of the lens was removed using phacoemulsification and ultrasound time was 2 minutes and 30 seconds, average power 25.7%. CDE 56.98. Vitreous was then present in the wound and  vitrectomy was carried out. The capsular tension ring was retrieved without difficulty. The wound was checked for vitreous and none was found. The paracentesis area was enlarged to accommodate the irrigation from the phaco and the wound itself was not enlarged at that moment. Vitrectomy was carried out in bimanual fashion. The wound was checked for vitreous, none was found. Miostat and Miochol were injected into the anterior chamber to shrink the pupil. The wound was enlarged to 6 mm using scissors to the left scissors and scissors to the right. A sheath glide was placed across the anterior chamber with DisCoVisc on top of it. The intraocular lens was then inserted into the sulcus and the sheath glide was removed. Care was taken to make sure that the haptics were below the incision superiorly. Vitrector was used to make a small peripheral iridotomy superonasally. Four 10-0 nylon sutures were placed across the wound and one across the enlarged paracentesis. These were tied, the knots rotated and buried. Irrigation-aspiration was used to remove the residual DisCoVisc. The sutures were tied, knots were rotated and buried superiorly. Balanced salt was injected through the paracentesis track and a suture was placed over the paracentesis track as well. The wound was checked for leaks, none were found. The conjunctiva was closed with cautery. The bridle sutures were removed and two drops of Vigamox were placed in the eye. A shield was placed on the eye and patient was discharged to recovery room in good condition.   ____________________________ Loura Back Glynn Freas, MD sad:cms D: 07/29/2011 13:47:11 ET T: 07/29/2011 13:59:42 ET JOB#: [id number]  cc: Remo Lipps A. Dimitra Woodstock, MD, <Dictator> Martie Lee MD ELECTRONICALLY SIGNED 07/31/2011 11:50

## 2014-07-31 NOTE — Op Note (Signed)
PATIENT NAME:  Manuel Randolph, Manuel Randolph MR#:  [id number] DATE OF BIRTH:  June 30, 1945  DATE OF PROCEDURE:  05/11/2014  PREOPERATIVE DIAGNOSIS:  Atherosclerotic occlusive disease bilateral lower extremities with lifestyle limiting claudication of the right lower extremity.   POSTOPERATIVE DIAGNOSIS:  Atherosclerotic occlusive disease bilateral lower extremities with lifestyle limiting claudication of the right lower extremity.   PROCEDURES PERFORMED:   1. Abdominal aortogram.  2. Right lower extremity distal runoff third order catheter placement.  3. Additional third order catheter placement right internal iliac artery.  4. Crosser atherectomy right superficial femoral artery, popliteal.  5. Percutaneous transluminal angioplasty right superficial femoral artery.  6. Percutaneous transluminal angioplasty right internal iliac artery.  7. Percutaneous transluminal angioplasty of the right common and external iliac artery.   SURGEON: Hortencia Pilar, MD.    SEDATION: Versed plus fentanyl.   CONTRAST USED: Isovue 115 mL.   FLUOROSCOPY TIME: 7.1 minutes.   ACCESS: A 7 French sheath left common femoral artery.   INDICATIONS: Mr. Kinker is a 69 year old gentleman who presented to the office with increasing pain in his right lower extremity. He has undergone both physical examination as well as noninvasive workup, all of which are consistent with severe atherosclerotic occlusive disease multilevel. Risks and benefits for angiography and intervention given his complaint of lifestyle limitation and constant pain were reviewed. All questions answered. The patient has agreed to proceed.   DESCRIPTION OF PROCEDURE: The patient is taken to special procedure suite, placed in the supine position. After adequate sedation is achieved both groins are prepped and draped in a sterile fashion. Appropriate timeout is called.   Ultrasound is placed in a sterile sleeve and the common femoral artery is identified. It is  echolucent and pulsatile indicating patency. Under direct visualization 1% lidocaine is infiltrated and subsequently a micropuncture needle is used to access the anterior wall of the common femoral vein, microwire followed by micro sheath, J-wire followed by a 5 French sheath and 5 French pigtail catheter.   The pigtail catheter is positioned at the level of T12 and AP projection of the aorta is obtained. Pigtail catheter is then repositioned to above the bifurcation and an LAO projection of the pelvis is obtained. RIM catheter and stiff angled Glidewire used to cross the bifurcation and subsequently a Balkan sheath was advanced up and over and magnified imaging is obtained in the LAO projection to demonstrate a string sign over approximately 2 cm at the origin of the internal iliac.   Balkan sheath is then used to perform hand injection of the distal external iliac, common femoral, and profunda femoris in an RAO projection and subsequently the wire and catheter are negotiated into the SFA where distal runoff is obtained. String sign is noted in the proximal 1/3 of the SFA and distally in the popliteal there is occlusion with reconstitution of the below knee popliteal and heavily diseased 3 vessel runoff to the foot.   A total of 4000 units of heparin is given and the sheath is advanced down into the origin of the SFA.  A straight catheter is then advanced down to the distal blockage and a Crosser atherectomy catheter is utilized. Re-entry is not successful in the popliteal area, however the proximal lesion in the SFA is crossed and the catheter has remained intraluminal and therefore I have elected to treat the proximal SFA to improve flow distally as much as possible and this is treated with a 5 x 4 balloon, inflation is to 10 atmospheres for 2  minutes. Followup imaging demonstrates an excellent result.   The sheath is then pulled back and ultimately a C2 catheter with V18 wire is used to access the  internal iliac. Magnified imaging is obtained and subsequently a 4 x 3 balloon is advanced across the origin of the internal iliac and inflated to 12 atmospheres for approximately 3 minutes. Followup imaging demonstrates the internal iliac is well treated, but there is a dissection noted in the external extending slightly into the common and therefore the wire is reinitiated into the external and a 6 x 6 Lutonix balloon is used to angioplasty the external and common iliac. Inflation is to 12 atmospheres for 2-1/2 minutes. Followup imaging now demonstrates resolution of the dissection with wide patency of the internal iliac.   The catheter is then pulled into the left external iliac. Oblique view is obtained and StarClose device deployed. There are no immediate complications.   INTERPRETATION: The abdominal aorta, bilateral common, and external iliac arteries are patent following treatment of the internal. On the right there is a dissection noted in the external and common on the right, however after balloon angioplasty at the origin of the internal there is wide patency of the internal, after angioplasty using the 6 mm Lutonix balloon there is wide patency of the external and common iliac arteries as well.   The common femoral on the right, and the profunda femoris are heavily diseased, but patent. The SFA demonstrates a string sign in its proximal 1/3, the above-knee popliteal occludes, it is reconstituted at the level of the tibial plateau and subsequently fills a diseased trifurcation.   Crosser catheter is used to cross the SFA lesion down into the popliteal, however intraluminal re-entry in the distal popliteal is not achieved and it is elected to treat the proximal SFA lesion. This is done with a 5 mm balloon angioplasty. Followup images demonstrate an excellent result in the proximal SFA.   SUMMARY: The patient presented complaining of lifestyle limiting claudication at a very short distance  primarily of the buttock, thigh, and to a lesser degree the calf. He has undergone successful revascularization of both the internal iliac as well as the proximal SFA and I am hopeful that this will alleviate his symptoms. If it does not he can be returned in approximately 4 weeks for a second attempt at crossing the occlusion in the popliteal.    ____________________________ Katha Cabal, MD ggs:bu D: 05/11/2014 13:47:07 ET T: 05/11/2014 20:51:18 ET JOB#: [id number]  cc: Katha Cabal, MD, <Dictator> Katha Cabal MD ELECTRONICALLY SIGNED 06/07/2014 14:24

## 2014-08-10 DIAGNOSIS — H4011X2 Primary open-angle glaucoma, moderate stage: Secondary | ICD-10-CM | POA: Diagnosis not present

## 2014-08-17 DIAGNOSIS — N289 Disorder of kidney and ureter, unspecified: Secondary | ICD-10-CM | POA: Diagnosis not present

## 2014-08-17 DIAGNOSIS — I10 Essential (primary) hypertension: Secondary | ICD-10-CM | POA: Diagnosis not present

## 2014-08-17 DIAGNOSIS — K274 Chronic or unspecified peptic ulcer, site unspecified, with hemorrhage: Secondary | ICD-10-CM | POA: Diagnosis not present

## 2014-08-17 DIAGNOSIS — E782 Mixed hyperlipidemia: Secondary | ICD-10-CM | POA: Diagnosis not present

## 2014-08-17 DIAGNOSIS — Z0000 Encounter for general adult medical examination without abnormal findings: Secondary | ICD-10-CM | POA: Diagnosis not present

## 2014-08-17 DIAGNOSIS — Z23 Encounter for immunization: Secondary | ICD-10-CM | POA: Diagnosis not present

## 2014-08-17 DIAGNOSIS — Z125 Encounter for screening for malignant neoplasm of prostate: Secondary | ICD-10-CM | POA: Diagnosis not present

## 2014-08-17 DIAGNOSIS — E1149 Type 2 diabetes mellitus with other diabetic neurological complication: Secondary | ICD-10-CM | POA: Diagnosis not present

## 2014-08-18 DIAGNOSIS — H20041 Secondary noninfectious iridocyclitis, right eye: Secondary | ICD-10-CM | POA: Diagnosis not present

## 2014-09-01 ENCOUNTER — Encounter
Admission: RE | Admit: 2014-09-01 | Discharge: 2014-09-01 | Disposition: A | Discharge status: Home / Self Care | Payer: Medicare Other | Source: Ambulatory Visit | Attending: Ophthalmology | Admitting: Ophthalmology

## 2014-09-01 DIAGNOSIS — Z01812 Encounter for preprocedural laboratory examination: Secondary | ICD-10-CM | POA: Diagnosis not present

## 2014-09-01 DIAGNOSIS — I10 Essential (primary) hypertension: Secondary | ICD-10-CM | POA: Insufficient documentation

## 2014-09-01 DIAGNOSIS — H2512 Age-related nuclear cataract, left eye: Secondary | ICD-10-CM | POA: Diagnosis not present

## 2014-09-01 DIAGNOSIS — Z01810 Encounter for preprocedural cardiovascular examination: Secondary | ICD-10-CM | POA: Insufficient documentation

## 2014-09-01 DIAGNOSIS — D649 Anemia, unspecified: Secondary | ICD-10-CM | POA: Diagnosis not present

## 2014-09-01 LAB — HEMOGLOBIN: Hemoglobin: 11.8 g/dL — ABNORMAL LOW (ref 13.0–18.0)

## 2014-09-05 MED ORDER — MOXIFLOXACIN HCL 0.5 % OP SOLN
OPHTHALMIC | Status: AC
  Filled 2014-09-05: qty 3

## 2014-09-05 MED ORDER — CYCLOPENTOLATE HCL 2 % OP SOLN
OPHTHALMIC | Status: AC
  Filled 2014-09-05: qty 2

## 2014-09-05 MED ORDER — PHENYLEPHRINE HCL 10 % OP SOLN
OPHTHALMIC | Status: AC
  Filled 2014-09-05: qty 5

## 2014-09-13 ENCOUNTER — Ambulatory Visit: Payer: Medicare Other | Admitting: Certified Registered Nurse Anesthetist

## 2014-09-13 ENCOUNTER — Ambulatory Visit
Admission: RE | Admit: 2014-09-13 | Discharge: 2014-09-13 | Disposition: A | Discharge status: Home / Self Care | Payer: Medicare Other | Source: Ambulatory Visit | Attending: Ophthalmology | Admitting: Ophthalmology

## 2014-09-13 ENCOUNTER — Encounter: Payer: Self-pay | Admitting: Ophthalmology

## 2014-09-13 ENCOUNTER — Encounter
Admission: RE | Disposition: A | Discharge status: Home / Self Care | Payer: Medicare Other | Source: Ambulatory Visit | Attending: Ophthalmology

## 2014-09-13 DIAGNOSIS — E161 Other hypoglycemia: Secondary | ICD-10-CM | POA: Diagnosis not present

## 2014-09-13 DIAGNOSIS — M7989 Other specified soft tissue disorders: Secondary | ICD-10-CM | POA: Diagnosis not present

## 2014-09-13 DIAGNOSIS — E780 Pure hypercholesterolemia: Secondary | ICD-10-CM | POA: Diagnosis not present

## 2014-09-13 DIAGNOSIS — I739 Peripheral vascular disease, unspecified: Secondary | ICD-10-CM | POA: Diagnosis not present

## 2014-09-13 DIAGNOSIS — E119 Type 2 diabetes mellitus without complications: Secondary | ICD-10-CM | POA: Diagnosis not present

## 2014-09-13 DIAGNOSIS — I10 Essential (primary) hypertension: Secondary | ICD-10-CM | POA: Insufficient documentation

## 2014-09-13 DIAGNOSIS — J45909 Unspecified asthma, uncomplicated: Secondary | ICD-10-CM | POA: Insufficient documentation

## 2014-09-13 DIAGNOSIS — R011 Cardiac murmur, unspecified: Secondary | ICD-10-CM | POA: Diagnosis not present

## 2014-09-13 DIAGNOSIS — H2512 Age-related nuclear cataract, left eye: Secondary | ICD-10-CM | POA: Diagnosis not present

## 2014-09-13 DIAGNOSIS — D649 Anemia, unspecified: Secondary | ICD-10-CM | POA: Insufficient documentation

## 2014-09-13 DIAGNOSIS — F17200 Nicotine dependence, unspecified, uncomplicated: Secondary | ICD-10-CM | POA: Insufficient documentation

## 2014-09-13 DIAGNOSIS — Z9841 Cataract extraction status, right eye: Secondary | ICD-10-CM | POA: Insufficient documentation

## 2014-09-13 HISTORY — PX: CATARACT EXTRACTION W/PHACO: SHX586

## 2014-09-13 LAB — GLUCOSE, CAPILLARY: Glucose-Capillary: 163 mg/dL — ABNORMAL HIGH (ref 65–99)

## 2014-09-13 SURGERY — PHACOEMULSIFICATION, CATARACT, WITH IOL INSERTION
Anesthesia: Monitor Anesthesia Care | Site: Eye | Laterality: Left | Wound class: Clean

## 2014-09-13 MED ORDER — TETRACAINE HCL 0.5 % OP SOLN
OPHTHALMIC | Status: AC
  Administered 2014-09-13: 1 [drp] via OPHTHALMIC
  Filled 2014-09-13: qty 2

## 2014-09-13 MED ORDER — CEFUROXIME OPHTHALMIC INJECTION 1 MG/0.1 ML
INJECTION | OPHTHALMIC | Status: AC
  Filled 2014-09-13: qty 0.1

## 2014-09-13 MED ORDER — EPINEPHRINE HCL 1 MG/ML IJ SOLN
INTRAMUSCULAR | Status: AC
  Filled 2014-09-13: qty 1

## 2014-09-13 MED ORDER — EPINEPHRINE HCL 1 MG/ML IJ SOLN
INTRAOCULAR | Status: DC | PRN
  Administered 2014-09-13: 200 mL

## 2014-09-13 MED ORDER — MIDAZOLAM HCL 2 MG/2ML IJ SOLN
INTRAMUSCULAR | Status: DC | PRN
  Administered 2014-09-13: 1 mg via INTRAVENOUS

## 2014-09-13 MED ORDER — TETRACAINE HCL 0.5 % OP SOLN
1.0000 [drp] | Freq: Once | OPHTHALMIC | Status: AC
  Administered 2014-09-13: 1 [drp] via OPHTHALMIC

## 2014-09-13 MED ORDER — LIDOCAINE HCL (PF) 1 % IJ SOLN
INTRAMUSCULAR | Status: AC
  Filled 2014-09-13: qty 2

## 2014-09-13 MED ORDER — FENTANYL CITRATE (PF) 100 MCG/2ML IJ SOLN
INTRAMUSCULAR | Status: DC | PRN
  Administered 2014-09-13: 50 ug via INTRAVENOUS

## 2014-09-13 MED ORDER — MOXIFLOXACIN HCL 0.5 % OP SOLN
OPHTHALMIC | Status: AC
  Filled 2014-09-13: qty 3

## 2014-09-13 MED ORDER — CARBACHOL 0.01 % IO SOLN
INTRAOCULAR | Status: DC | PRN
  Administered 2014-09-13: 0.5 mL via INTRAOCULAR

## 2014-09-13 MED ORDER — ARMC OPHTHALMIC DILATING GEL
1.0000 "application " | OPHTHALMIC | Status: AC
  Administered 2014-09-13: 1 via OPHTHALMIC

## 2014-09-13 MED ORDER — NA CHONDROIT SULF-NA HYALURON 40-17 MG/ML IO SOLN
INTRAOCULAR | Status: AC
  Filled 2014-09-13: qty 1

## 2014-09-13 MED ORDER — MOXIFLOXACIN HCL 0.5 % OP SOLN - NO CHARGE
OPHTHALMIC | Status: DC | PRN
  Administered 2014-09-13: 1 [drp]

## 2014-09-13 MED ORDER — POVIDONE-IODINE 5 % OP SOLN
OPHTHALMIC | Status: AC
  Administered 2014-09-13: 1 via OPHTHALMIC
  Filled 2014-09-13: qty 30

## 2014-09-13 MED ORDER — MOXIFLOXACIN HCL 0.5 % OP SOLN: 1.0000 [drp] | OPHTHALMIC | Status: DC | PRN

## 2014-09-13 MED ORDER — POVIDONE-IODINE 5 % OP SOLN
1.0000 "application " | Freq: Once | OPHTHALMIC | Status: AC
  Administered 2014-09-13: 1 via OPHTHALMIC

## 2014-09-13 MED ORDER — ARMC OPHTHALMIC DILATING GEL
OPHTHALMIC | Status: AC
  Administered 2014-09-13: 1 via OPHTHALMIC
  Filled 2014-09-13: qty 0.25

## 2014-09-13 MED ORDER — CEFUROXIME OPHTHALMIC INJECTION 1 MG/0.1 ML
INJECTION | OPHTHALMIC | Status: DC | PRN
Start: 2014-09-13 — End: 2014-09-13
  Administered 2014-09-13: 0.1 mL via INTRACAMERAL

## 2014-09-13 MED ORDER — SODIUM CHLORIDE 0.9 % IV SOLN
INTRAVENOUS | Status: DC
  Administered 2014-09-13: 07:00:00 via INTRAVENOUS

## 2014-09-13 SURGICAL SUPPLY — 21 items
ACTIVE FMS ×3 IMPLANT
CANNULA ANT/CHMB 27GA (MISCELLANEOUS) ×3 IMPLANT
GLOVE BIO SURGEON STRL SZ8 (GLOVE) ×3 IMPLANT
GLOVE BIOGEL M 6.5 STRL (GLOVE) ×3 IMPLANT
GLOVE SURG LX 8.0 MICRO (GLOVE) ×2
GLOVE SURG LX STRL 8.0 MICRO (GLOVE) ×1 IMPLANT
GOWN STRL REUS W/ TWL LRG LVL3 (GOWN DISPOSABLE) ×2 IMPLANT
GOWN STRL REUS W/TWL LRG LVL3 (GOWN DISPOSABLE) ×4
LENS IOL TECNIS 20.5 (Intraocular Lens) ×3 IMPLANT
LENS IOL TECNIS MONO 1P 20.5 (Intraocular Lens) ×1 IMPLANT
PACK CATARACT (MISCELLANEOUS) ×3 IMPLANT
PACK CATARACT BRASINGTON LX (MISCELLANEOUS) ×3 IMPLANT
PACK EYE AFTER SURG (MISCELLANEOUS) ×3 IMPLANT
SOL BSS BAG (MISCELLANEOUS) ×3
SOL PREP PVP 2OZ (MISCELLANEOUS) ×3
SOLUTION BSS BAG (MISCELLANEOUS) ×1 IMPLANT
SOLUTION PREP PVP 2OZ (MISCELLANEOUS) ×1 IMPLANT
SYR 5ML LL (SYRINGE) ×3 IMPLANT
SYR TB 1ML 27GX1/2 LL (SYRINGE) ×3 IMPLANT
WATER STERILE IRR 1000ML POUR (IV SOLUTION) ×3 IMPLANT
WIPE NON LINTING 3.25X3.25 (MISCELLANEOUS) ×3 IMPLANT

## 2014-09-13 NOTE — H&P (Signed)
All labs reviewed. Abnormal studies sent to patients PCP when indicated.  Previous H&P reviewed, patient examined, there are NO CHANGES.  Manuel Randolph,Manuel LOUIS6/14/20167:13 AM

## 2014-09-13 NOTE — Anesthesia Postprocedure Evaluation (Signed)
  Anesthesia Post-op Note  Patient: Manuel Greenhouse Sr.  Procedure(s) Performed: Procedure(s) with comments: CATARACT EXTRACTION PHACO AND INTRAOCULAR LENS PLACEMENT (IOC) (Left) - [place] 01:03 AP% 27.7 CDE 17.66 fluid pack lot#[id number]  Anesthesia type:MAC  Patient location: PACU  Post pain: Pain level controlled  Post assessment: Post-op Vital signs reviewed, Patient's Cardiovascular Status Stable, Respiratory Function Stable, Patent Airway and No signs of Nausea or vomiting  Post vital signs: Reviewed and stable  Last Vitals:  Filed Vitals:   09/13/14 0748  BP: 188/68  Pulse: 53  Temp: 35.7 C  Resp: 16    Level of consciousness: awake, alert  and patient cooperative  Complications: No apparent anesthesia complications

## 2014-09-13 NOTE — Transfer of Care (Signed)
Immediate Anesthesia Transfer of Care Note  Patient: Manuel ENDERSON Sr.  Procedure(s) Performed: Procedure(s) with comments: CATARACT EXTRACTION PHACO AND INTRAOCULAR LENS PLACEMENT (IOC) (Left) - [place] 01:03 AP% 27.7 CDE 17.66 fluid pack lot#[id number]  Patient Location: Short Stay  Anesthesia Type:MAC  Level of Consciousness: awake, alert  and oriented  Airway & Oxygen Therapy: Patient Spontanous Breathing  Post-op Assessment: Report given to RN and Post -op Vital signs reviewed and stable  Post vital signs: Reviewed and stable  Last Vitals:  Filed Vitals:   09/13/14 0748  BP: 188/68  Pulse: 53  Temp: 35.7 C  Resp: 16    Complications: No apparent anesthesia complications

## 2014-09-13 NOTE — Anesthesia Preprocedure Evaluation (Signed)
Anesthesia Evaluation  Patient identified by MRN, date of birth, ID band Patient awake    Reviewed: Allergy & Precautions, NPO status , Patient's Chart, lab work & pertinent test results  Airway Mallampati: III  TM Distance: >3 FB Neck ROM: Full    Dental  (+) Chipped, Missing   Pulmonary Current Smoker (1/2 ppd),          Cardiovascular hypertension, Pt. on medications and Pt. on home beta blockers + Valvular Problems/Murmurs (no tx)     Neuro/Psych    GI/Hepatic   Endo/Other  diabetes, Type 2, Oral Hypoglycemic Agents  Renal/GU      Musculoskeletal   Abdominal   Peds  Hematology   Anesthesia Other Findings   Reproductive/Obstetrics                             Anesthesia Physical Anesthesia Plan  ASA: III  Anesthesia Plan: MAC   Post-op Pain Management:    Induction: Intravenous  Airway Management Planned: Nasal Cannula  Additional Equipment:   Intra-op Plan:   Post-operative Plan:   Informed Consent: I have reviewed the patients History and Physical, chart, labs and discussed the procedure including the risks, benefits and alternatives for the proposed anesthesia with the patient or authorized representative who has indicated his/her understanding and acceptance.     Plan Discussed with:   Anesthesia Plan Comments:         Anesthesia Quick Evaluation

## 2014-09-13 NOTE — Discharge Instructions (Signed)
See cataract post op handout  AMBULATORY SURGERY  DISCHARGE INSTRUCTIONS   1) The drugs that you were given will stay in your system until tomorrow so for the next 24 hours you should not:  A) Drive an automobile B) Make any legal decisions C) Drink any alcoholic beverage   2) You may resume regular meals tomorrow.  Today it is better to start with liquids and gradually work up to solid foods.  You may eat anything you prefer, but it is better to start with liquids, then soup and crackers, and gradually work up to solid foods.   3) Please notify your doctor immediately if you have any unusual bleeding, trouble breathing, redness and pain at the surgery site, drainage, fever, or pain not relieved by medication. 4)   5) Your post-operative visit with Dr.    George Ina                                 is: Date:                        Time:    Please call to schedule your post-operative visit.  6) Additional Instructions: 7)

## 2014-09-13 NOTE — Op Note (Signed)
PREOPERATIVE DIAGNOSIS:  Nuclear sclerotic cataract of the left eye.   POSTOPERATIVE DIAGNOSIS:  nuclear sclerotic cataract left eye   OPERATIVE PROCEDURE:  Procedure(s): CATARACT EXTRACTION PHACO AND INTRAOCULAR LENS PLACEMENT (IOC)   SURGEON:  Birder Robson, MD.   ANESTHESIA:   Anesthesiologist: Gunnar Fusi, MD CRNA: Demetrius Charity, CRNA  1.      Managed anesthesia care. 2.      Topical tetracaine drops followed by 2% Xylocaine jelly applied in the preoperative holding area.   COMPLICATIONS:  None.   TECHNIQUE:   Stop and chop   DESCRIPTION OF PROCEDURE:  The patient was examined and consented in the preoperative holding area where the aforementioned topical anesthesia was applied to the left eye and then brought back to the Operating Room where the left eye was prepped and draped in the usual sterile ophthalmic fashion and a lid speculum was placed. A paracentesis was created with the side port blade and the anterior chamber was filled with viscoelastic. A near clear corneal incision was performed with the steel keratome. A continuous curvilinear capsulorrhexis was performed with a cystotome followed by the capsulorrhexis forceps. Hydrodissection and hydrodelineation were carried out with BSS on a blunt cannula. The lens was removed in a stop and chop  technique and the remaining cortical material was removed with the irrigation-aspiration handpiece. The capsular bag was inflated with viscoelastic and the Technis ZCB00 lens was placed in the capsular bag without complication. The remaining viscoelastic was removed from the eye with the irrigation-aspiration handpiece. The wounds were hydrated. The anterior chamber was flushed with Miostat and the eye was inflated to physiologic pressure. 0.1 mL of cefuroxime concentration 10 mg/mL was placed in the anterior chamber. The wounds were found to be water tight. The eye was dressed with Vigamox. The patient was given protective glasses to  wear throughout the day and a shield with which to sleep tonight. The patient was also given drops with which to begin a drop regimen today and will follow-up with me in one day.  Implant Name Type Inv. Item Serial No. Manufacturer Lot No. LRB No. Used  LENS IMPL INTRAOC ZCB00 20.5 - [id number] Intraocular Lens LENS IMPL INTRAOC ZCB00 20.5 [id number]   Left 1   Procedure(s) with comments: CATARACT EXTRACTION PHACO AND INTRAOCULAR LENS PLACEMENT (IOC) (Left) - [place] 01:03 AP% 27.7 CDE 17.66 fluid pack lot#[id number]  Electronically signed: Ihsan Nomura,Aaliyah LOUIS 09/13/2014 7:43 AM

## 2014-09-13 NOTE — Anesthesia Procedure Notes (Addendum)
Procedure Name: MAC Performed by: Demetrius Charity Pre-anesthesia Checklist: Patient identified, Timeout performed, Patient being monitored, Suction available and Emergency Drugs available Oxygen Delivery Method: Nasal cannula Placement Confirmation: positive ETCO2

## 2014-09-15 MED FILL — Moxifloxacin HCl Ophth Soln 0.5% (Base Equiv): OPHTHALMIC | Qty: 3 | Status: AC

## 2014-10-13 ENCOUNTER — Other Ambulatory Visit: Payer: Self-pay | Admitting: Internal Medicine

## 2015-01-04 ENCOUNTER — Other Ambulatory Visit: Payer: Self-pay | Admitting: Internal Medicine

## 2015-01-04 ENCOUNTER — Ambulatory Visit
Admission: RE | Admit: 2015-01-04 | Discharge: 2015-01-04 | Disposition: A | Discharge status: Home / Self Care | Payer: Medicare Other | Source: Ambulatory Visit | Attending: Internal Medicine | Admitting: Internal Medicine

## 2015-01-04 ENCOUNTER — Ambulatory Visit
Admission: RE | Admit: 2015-01-04 | Discharge: 2015-01-04 | Disposition: A | Discharge status: Home / Self Care | Payer: Medicare Other | Source: Ambulatory Visit | Attending: Cardiology | Admitting: Cardiology

## 2015-01-04 DIAGNOSIS — F17200 Nicotine dependence, unspecified, uncomplicated: Secondary | ICD-10-CM | POA: Diagnosis not present

## 2015-01-04 DIAGNOSIS — E119 Type 2 diabetes mellitus without complications: Secondary | ICD-10-CM | POA: Diagnosis not present

## 2015-01-04 DIAGNOSIS — R918 Other nonspecific abnormal finding of lung field: Secondary | ICD-10-CM | POA: Insufficient documentation

## 2015-01-04 DIAGNOSIS — I119 Hypertensive heart disease without heart failure: Secondary | ICD-10-CM | POA: Diagnosis not present

## 2015-01-04 DIAGNOSIS — R0602 Shortness of breath: Secondary | ICD-10-CM

## 2015-01-04 DIAGNOSIS — D72829 Elevated white blood cell count, unspecified: Secondary | ICD-10-CM | POA: Diagnosis not present

## 2015-02-07 DIAGNOSIS — K5520 Angiodysplasia of colon without hemorrhage: Secondary | ICD-10-CM | POA: Insufficient documentation

## 2015-02-07 DIAGNOSIS — D500 Iron deficiency anemia secondary to blood loss (chronic): Secondary | ICD-10-CM | POA: Diagnosis not present

## 2015-02-07 DIAGNOSIS — Z8774 Personal history of (corrected) congenital malformations of heart and circulatory system: Secondary | ICD-10-CM | POA: Diagnosis not present

## 2015-02-19 ENCOUNTER — Encounter: Payer: Self-pay | Admitting: Internal Medicine

## 2015-02-19 ENCOUNTER — Other Ambulatory Visit: Payer: Self-pay | Admitting: Internal Medicine

## 2015-02-19 DIAGNOSIS — K284 Chronic or unspecified gastrojejunal ulcer with hemorrhage: Secondary | ICD-10-CM | POA: Insufficient documentation

## 2015-02-19 DIAGNOSIS — E1122 Type 2 diabetes mellitus with diabetic chronic kidney disease: Secondary | ICD-10-CM | POA: Insufficient documentation

## 2015-02-19 DIAGNOSIS — Z125 Encounter for screening for malignant neoplasm of prostate: Secondary | ICD-10-CM

## 2015-02-19 DIAGNOSIS — E1169 Type 2 diabetes mellitus with other specified complication: Secondary | ICD-10-CM

## 2015-02-19 DIAGNOSIS — E782 Mixed hyperlipidemia: Secondary | ICD-10-CM

## 2015-02-19 DIAGNOSIS — C9110 Chronic lymphocytic leukemia of B-cell type not having achieved remission: Secondary | ICD-10-CM | POA: Insufficient documentation

## 2015-02-19 DIAGNOSIS — I358 Other nonrheumatic aortic valve disorders: Secondary | ICD-10-CM | POA: Insufficient documentation

## 2015-02-19 DIAGNOSIS — I739 Peripheral vascular disease, unspecified: Secondary | ICD-10-CM | POA: Insufficient documentation

## 2015-02-19 DIAGNOSIS — E1142 Type 2 diabetes mellitus with diabetic polyneuropathy: Secondary | ICD-10-CM | POA: Insufficient documentation

## 2015-02-19 DIAGNOSIS — N183 Chronic kidney disease, stage 3 unspecified: Secondary | ICD-10-CM | POA: Insufficient documentation

## 2015-02-19 DIAGNOSIS — F17200 Nicotine dependence, unspecified, uncomplicated: Secondary | ICD-10-CM | POA: Insufficient documentation

## 2015-02-19 DIAGNOSIS — N486 Induration penis plastica: Secondary | ICD-10-CM | POA: Insufficient documentation

## 2015-02-19 DIAGNOSIS — I10 Essential (primary) hypertension: Secondary | ICD-10-CM | POA: Insufficient documentation

## 2015-02-19 DIAGNOSIS — K274 Chronic or unspecified peptic ulcer, site unspecified, with hemorrhage: Secondary | ICD-10-CM | POA: Insufficient documentation

## 2015-02-20 ENCOUNTER — Inpatient Hospital Stay: Payer: Medicare Other | Attending: Internal Medicine | Admitting: Internal Medicine

## 2015-02-20 ENCOUNTER — Inpatient Hospital Stay: Payer: Medicare Other | Admitting: *Deleted

## 2015-02-20 ENCOUNTER — Encounter: Payer: Self-pay | Admitting: Internal Medicine

## 2015-02-20 VITALS — BP 173/75 | HR 60 | Temp 99.0°F | Resp 18 | Wt 148.8 lb

## 2015-02-20 DIAGNOSIS — E119 Type 2 diabetes mellitus without complications: Secondary | ICD-10-CM | POA: Insufficient documentation

## 2015-02-20 DIAGNOSIS — D649 Anemia, unspecified: Secondary | ICD-10-CM | POA: Diagnosis not present

## 2015-02-20 DIAGNOSIS — D696 Thrombocytopenia, unspecified: Secondary | ICD-10-CM

## 2015-02-20 DIAGNOSIS — I10 Essential (primary) hypertension: Secondary | ICD-10-CM | POA: Insufficient documentation

## 2015-02-20 DIAGNOSIS — Z79899 Other long term (current) drug therapy: Secondary | ICD-10-CM

## 2015-02-20 DIAGNOSIS — Z7982 Long term (current) use of aspirin: Secondary | ICD-10-CM | POA: Insufficient documentation

## 2015-02-20 DIAGNOSIS — Z7984 Long term (current) use of oral hypoglycemic drugs: Secondary | ICD-10-CM | POA: Diagnosis not present

## 2015-02-20 DIAGNOSIS — E785 Hyperlipidemia, unspecified: Secondary | ICD-10-CM | POA: Diagnosis not present

## 2015-02-20 DIAGNOSIS — C9110 Chronic lymphocytic leukemia of B-cell type not having achieved remission: Secondary | ICD-10-CM | POA: Diagnosis not present

## 2015-02-20 DIAGNOSIS — F17210 Nicotine dependence, cigarettes, uncomplicated: Secondary | ICD-10-CM | POA: Diagnosis not present

## 2015-02-20 LAB — CBC WITH DIFFERENTIAL/PLATELET
Basophils Absolute: 0.1 10*3/uL (ref 0–0.1)
Eosinophils Absolute: 0.2 10*3/uL (ref 0–0.7)
Eosinophils Relative: 1 %
HEMATOCRIT: 38.9 % — AB (ref 40.0–52.0)
Hemoglobin: 12.4 g/dL — ABNORMAL LOW (ref 13.0–18.0)
Lymphs Abs: 15.9 10*3/uL — ABNORMAL HIGH (ref 1.0–3.6)
MCH: 30 pg (ref 26.0–34.0)
MCHC: 31.8 g/dL — AB (ref 32.0–36.0)
MCV: 94.4 fL (ref 80.0–100.0)
MONO ABS: 0.9 10*3/uL (ref 0.2–1.0)
NEUTROS ABS: 4.9 10*3/uL (ref 1.4–6.5)
Neutrophils Relative %: 22 %
Platelets: 150 10*3/uL (ref 150–440)
RBC: 4.12 MIL/uL — ABNORMAL LOW (ref 4.40–5.90)
RDW: 14.1 % (ref 11.5–14.5)
WBC: 22 10*3/uL — ABNORMAL HIGH (ref 3.8–10.6)

## 2015-02-20 NOTE — Progress Notes (Addendum)
[place] @ [facility] [phone]  Fax:([phone]) [place]: 10-26-1945  MR#: [id number]  [id number]  Patient Care Team: [name], MD as PCP - General (Family Medicine)  CHIEF COMPLAINT:  Chief Complaint  Patient presents with  . Follow-up    [name]/anemia     No history exists.    No flowsheet data found.  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 According to previous notes he had flow cytometry, which was consistent with [name].he has been in managed expectantly. He has not been seen in our clinic for almost 2 years.  INTERVAL HISTORY:  Since his last appointment Manuel Randolph had an episode of lower GI bleed which likely was secondary to AV malformation. Otherwise, he continues to do reasonably well, although she is major issue is left leg weakness/foot drop, for which she underwent revascularization procedure. He otherwise, denies chest pain, shortness of breath, nausea, vomiting, diarrhea, constipation, weight loss or gain, night sweats, fevers.  REVIEW OF SYSTEMS:    As per HPI. Otherwise, a complete review of systems is negatve.  PAST MEDICAL HISTORY: Past Medical History  Diagnosis Date  . Hypertension   . Hyperlipidemia   . Diabetes ([place])   . Anemia     PAST SURGICAL HISTORY: Past Surgical History  Procedure Laterality Date  . Cataract extraction w/phaco Left 09/13/2014    Procedure: CATARACT EXTRACTION PHACO AND INTRAOCULAR LENS PLACEMENT (IOC);  Surgeon: Birder Robson, MD;  Location: [facility] ORS;  Service: Ophthalmology;  Laterality: Left;  [place] 01:03   . Popliteal artery angioplasty Left 2014  . Ptca Right     leg  . Colonoscopy  2014  . Upper gastrointestinal endoscopy  2014    FAMILY HISTORY Family History  Problem Relation Age of Onset  . Diabetes Mother   . Diabetes Father     ADVANCED DIRECTIVES:  No flowsheet data found.  HEALTH MAINTENANCE: Social History    Substance Use Topics  . Smoking status: Current Every Day Smoker -- 0.50 packs/day  . Smokeless tobacco: Never Used  . Alcohol Use: No      No Known Allergies  Current Outpatient Prescriptions  Medication Sig Dispense Refill  . aspirin 81 MG tablet Take 1 tablet by mouth daily.    Marland Kitchen gabapentin (NEURONTIN) 300 MG capsule Take 1 capsule by mouth two times daily 180 capsule 3  . Iron Polysacch Cmplx-B12-FA 150-0.025-1 MG CAPS Take by mouth.    Marland Kitchen lisinopril (PRINIVIL,ZESTRIL) 40 MG tablet Take 1 tablet by mouth two  times daily 180 tablet 3  . metFORMIN (GLUCOPHAGE) 500 MG tablet Take 1 tablet by mouth 2 (two) times daily.    . metoprolol tartrate (LOPRESSOR) 25 MG tablet Take 1 tablet by mouth two  times daily 180 tablet 3  . pantoprazole (PROTONIX) 40 MG tablet Take 1 tablet by mouth  daily 90 tablet 3  . simvastatin (ZOCOR) 20 MG tablet Take 1 tablet by mouth at  bedtime 90 tablet 3  . vitamin B-12 (CYANOCOBALAMIN) 100 MCG tablet Take 100 mcg by mouth daily.     No current facility-administered medications for this visit.    OBJECTIVE:  Filed Vitals:   02/20/15 1521  BP: 173/75  Pulse: 60  Temp: 99 F (37.2 C)  Resp: 18     Body mass index is 22.63 kg/(m^2).    ECOG FS:1 - Symptomatic but completely ambulatory  PHYSICAL EXAM: BP  173/75 mmHg  Pulse 60  Temp(Src) 99 F (37.2 C) (Tympanic)  Resp 18  Wt 148 lb 13 oz (67.5 kg)  General Appearance:    Alert, cooperative, no distress, appears older than stated age African-American male  Head:    Normocephalic, without obvious abnormality, atraumatic  Eyes:    PERRL, conjunctiva/corneas clear, EOM's intact, fundi    benign, both eyes       Ears:    Normal TM's and external ear canals, both ears  Nose:   Nares normal, septum midline, mucosa normal, no drainage   or sinus tenderness  Throat:   Lips, mucosa, and tongue normal; teeth and gums normal  Neck:   Supple, symmetrical, trachea midline, no adenopathy;       thyroid:   No enlargement/tenderness/nodules; no carotid   bruit or JVD  Back:     Symmetric, no curvature, ROM normal, no CVA tenderness  Lungs:     Clear to auscultation bilaterally, respirations unlabored  Chest wall:    No tenderness or deformity  Heart:    Regular rate and rhythm, S1 and S2 normal, no murmur, rub   or gallop  Abdomen:     Soft, non-tender, bowel sounds active all four quadrants,    no masses, no organomegaly  Genitalia:    Normal male without lesion, discharge or tenderness  Rectal:    Normal tone, normal prostate, no masses or tenderness;   guaiac negative stool  Extremities:   Extremities normal, atraumatic, no cyanosis or edema  Pulses:   2+ and symmetric all extremities  Skin:   Skin color, texture, turgor normal, no rashes or lesions  Lymph nodes:   Cervical, supraclavicular, and inguinal nodes normal. There are shotty axillary lymph nodes bilaterally, mobile, nontender  Neurologic:   CNII-XII intact. Normal strength, sensation and reflexes      throughout      LAB RESULTS:  Recent Results (from the past 2160 hour(s))  IgG, IgA, IgM     Status: None   Collection Time: 02/20/15  4:11 PM  Result Value Ref Range   IgG (Immunoglobin G), Serum 1008 700 - 1600 mg/dL   IgA 357 61 - 437 mg/dL   IgM, Serum 74 20 - 172 mg/dL    Comment: (NOTE) Performed At: [facility] [place], [place] [id number] Lindon Romp MD [id number]   CBC with Differential/Platelet     Status: Abnormal   Collection Time: 02/20/15  4:11 PM  Result Value Ref Range   WBC 22.0 (H) 3.8 - 10.6 K/uL   RBC 4.12 (L) 4.40 - 5.90 MIL/uL   Hemoglobin 12.4 (L) 13.0 - 18.0 g/dL   HCT 38.9 (L) 40.0 - 52.0 %   MCV 94.4 80.0 - 100.0 fL   MCH 30.0 26.0 - 34.0 pg   MCHC 31.8 (L) 32.0 - 36.0 g/dL   RDW 14.1 11.5 - 14.5 %   Platelets 150 150 - 440 K/uL   Neutrophils Relative % 22% %   Neutro Abs 4.9 1.4 - 6.5 K/uL   Lymphocytes Relative 72% %   Lymphs Abs 15.9 (H) 1.0 - 3.6  K/uL   Monocytes Relative 4% %   Monocytes Absolute 0.9 0.2 - 1.0 K/uL   Eosinophils Relative 1% %   Eosinophils Absolute 0.2 0 - 0.7 K/uL   Basophils Relative 1% %   Basophils Absolute 0.1 0 - 0.1 K/uL    No visits with results within 5 Day(s) from this visit. Latest  known visit with results is:  Admission on 09/13/2014, Discharged on 09/13/2014  Component Date Value Ref Range Status  . Glucose-Capillary 09/13/2014 163* 65 - 99 mg/dL Final  . Comment 1 09/13/2014 Notify RN   Final    STUDIES: No results found.  ASSESSMENT: [name], Rai stage 0-diagnosed in 2014 currently on observation only. He appears to have stable blood counts, including white blood cell count, total lymphocyte count, hemoglobin and platelet count. He will continue to follow up with [place] on an every six-month basis. We will check quantitative immunoglobulins, and will request FISH panel to better prognosticate his condition. Anemia-likely secondary to GI bleeding from AV malformation in the past. Hemoglobin appears very stable. Patients with [name] could have autoimmune hemolytic anemia, as well as anemia secondary to bone marrow involvement with [name].however, due to stability of his blood counts we will not perform any additional evaluation.. Thrombocytopenia-thrombocytopenia could be secondary to ITP (immune) or bone marrow involvement with [name]. Due to stability of hematologic parameters, we will not perform any additional evaluation. Lung cancer screening-since patient has been smoking for more than 50 years at least 1 pack per day, he is a candidate for CT chest for screening of lung cancer. We will refer Manuel Randolph to our pulmonary nurse navigator, who will be able to schedule a screening test.  MEDICAL DECISION MAKING:     Patient expressed understanding and was in agreement with this plan. He also understands that He can call clinic at any time with any questions, concerns, or complaints.    No matching staging  information was found for the patient.  Roxana Hires, MD   02/20/2015 4:54 PM

## 2015-02-20 NOTE — Progress Notes (Signed)
Patient has not been seen in this clinic since 2014.  He does not offer any concerns or problems.

## 2015-02-21 ENCOUNTER — Encounter: Payer: Self-pay | Admitting: Internal Medicine

## 2015-02-21 LAB — IGG, IGA, IGM
IgA: 357 mg/dL (ref 61–437)
IgG (Immunoglobin G), Serum: 1008 mg/dL (ref 700–1600)
IgM, Serum: 74 mg/dL (ref 20–172)

## 2015-02-27 LAB — FISH HES LEUKEMIA, 4Q12 REA

## 2015-03-03 ENCOUNTER — Other Ambulatory Visit: Payer: Self-pay

## 2015-03-06 ENCOUNTER — Other Ambulatory Visit: Payer: Self-pay | Admitting: Internal Medicine

## 2015-03-06 MED ORDER — GLUCOSE BLOOD VI STRP: ORAL_STRIP | Status: DC

## 2015-03-06 MED ORDER — LANCETS 28G MISC: 1.0000 | Freq: Every day | Status: DC

## 2015-03-06 MED ORDER — ACCU-CHEK AVIVA DEVI: Status: AC

## 2015-06-05 ENCOUNTER — Other Ambulatory Visit: Payer: Self-pay | Admitting: Internal Medicine

## 2015-06-06 ENCOUNTER — Ambulatory Visit: Payer: Self-pay | Admitting: Internal Medicine

## 2015-07-04 ENCOUNTER — Encounter: Payer: Self-pay | Admitting: Internal Medicine

## 2015-07-04 ENCOUNTER — Ambulatory Visit (INDEPENDENT_AMBULATORY_CARE_PROVIDER_SITE_OTHER): Payer: Medicare Other | Admitting: Internal Medicine

## 2015-07-04 VITALS — BP 140/78 | HR 72 | Temp 98.5°F | Wt 135.0 lb

## 2015-07-04 DIAGNOSIS — E1142 Type 2 diabetes mellitus with diabetic polyneuropathy: Secondary | ICD-10-CM | POA: Diagnosis not present

## 2015-07-04 DIAGNOSIS — D510 Vitamin B12 deficiency anemia due to intrinsic factor deficiency: Secondary | ICD-10-CM

## 2015-07-04 DIAGNOSIS — E782 Mixed hyperlipidemia: Secondary | ICD-10-CM

## 2015-07-04 DIAGNOSIS — M25552 Pain in left hip: Secondary | ICD-10-CM | POA: Diagnosis not present

## 2015-07-04 DIAGNOSIS — I10 Essential (primary) hypertension: Secondary | ICD-10-CM

## 2015-07-04 DIAGNOSIS — I70208 Unspecified atherosclerosis of native arteries of extremities, other extremity: Secondary | ICD-10-CM | POA: Diagnosis not present

## 2015-07-04 DIAGNOSIS — E1169 Type 2 diabetes mellitus with other specified complication: Secondary | ICD-10-CM

## 2015-07-04 DIAGNOSIS — C9110 Chronic lymphocytic leukemia of B-cell type not having achieved remission: Secondary | ICD-10-CM | POA: Diagnosis not present

## 2015-07-04 DIAGNOSIS — E1149 Type 2 diabetes mellitus with other diabetic neurological complication: Secondary | ICD-10-CM

## 2015-07-04 DIAGNOSIS — M25559 Pain in unspecified hip: Secondary | ICD-10-CM | POA: Insufficient documentation

## 2015-07-04 MED ORDER — VITAMIN B12 100 MCG PO TABS: 100.0000 ug | ORAL_TABLET | Freq: Every day | ORAL | Status: DC

## 2015-07-04 NOTE — Progress Notes (Signed)
Date:  07/04/2015   Name:  Manuel Randolph   DOB:  [birth year]   MRN:  [id number]   Chief Complaint: Diabetes; Hyperlipidemia; Hypertension; and Leg Issue Diabetes He presents for his follow-up diabetic visit. He has type 2 diabetes mellitus. Pertinent negatives for hypoglycemia include no dizziness, headaches or tremors. Pertinent negatives for diabetes include no chest pain, no fatigue, no polydipsia and no polyuria. Symptoms are stable. There is no change in his home blood glucose trend. His breakfast blood glucose is taken between 6-7 am. His breakfast blood glucose range is generally 90-110 mg/dl. Eye exam is current.  Hyperlipidemia This is a chronic problem. The current episode started more than 1 year ago. The problem is controlled. Recent lipid tests were reviewed and are normal. Associated symptoms include myalgias (claudication). Pertinent negatives include no chest pain or shortness of breath.  Hypertension This is a chronic problem. The current episode started more than 1 year ago. The problem is unchanged. Pertinent negatives include no chest pain, headaches, palpitations or shortness of breath.   Leg weakness - Patient complains that his left leg is dragging. He is not sure how long this has been going on maybe a month. He denies any numbness in his leg or arm. No change in speech. No weakness in his arm and no weakness in his leg. On closer questioning he does have some discomfort at the left hip. He denies falls. He has a cane that he sometimes uses for ambulation.  [name] - followed by Oncology.  Counts have been stable.  He denies bleeding, headache or dizziness.  He does have some change in his vision but can not afford the copay to see the Ophthalmologist.  He is instructed to follow up every 6 months - next appt in May.  PVD - he denies change in leg symptoms.  He has cramps occasionally.  He does not walk for exercise.  He continues to smoke.  He denies leg ulcers or other  wounds.  Review of Systems  Constitutional: Negative for appetite change, fatigue and unexpected weight change.  HENT: Negative for congestion, hearing loss, tinnitus and trouble swallowing.   Eyes: Positive for visual disturbance (but can't afford the eye doctor copay).  Respiratory: Negative for cough, shortness of breath and wheezing.   Cardiovascular: Negative for chest pain, palpitations and leg swelling.  Gastrointestinal: Negative for abdominal pain and blood in stool.  Endocrine: Negative for polydipsia and polyuria.  Genitourinary: Negative for dysuria and hematuria.  Musculoskeletal: Positive for myalgias (claudication), arthralgias and gait problem. Negative for joint swelling.  Skin: Negative for color change and rash.  Neurological: Negative for dizziness, tremors, syncope, numbness and headaches.  Hematological: Negative for adenopathy. Does not bruise/bleed easily.  Psychiatric/Behavioral: Negative for sleep disturbance and dysphoric mood.    Patient Active Problem List   Diagnosis Date Noted  . Cardiac murmur 02/19/2015  . Chronic lymphocytic leukemia ([place]) 02/19/2015  . Type 2 diabetes mellitus with other diabetic neurological complication ([place]) 0000000  . Essential (primary) hypertension 02/19/2015  . DM type 2 with diabetic mixed hyperlipidemia ([place]) 02/19/2015  . Diabetic peripheral neuropathy associated with type 2 diabetes mellitus ([place]) 02/19/2015  . Bleeding ulcer 02/19/2015  . Peripheral vascular disease ([place]) 02/19/2015  . 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 Encounter for screening for malignant neoplasm of prostate 02/19/2015  . Impaired renal function 02/19/2015  . Compulsive tobacco user syndrome 02/19/2015  . 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  .  Aortic valve defect 12/22/2012  . 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 Authorizing Provider  aspirin 81 MG tablet Take 1 tablet by mouth daily.   Yes Historical Provider, MD  Blood Glucose Monitoring Suppl (ACCU-CHEK AVIVA) device Use as instructed 03/06/15 03/05/16 Yes [name], MD  gabapentin (NEURONTIN) 300 MG capsule Take 1 capsule by mouth two times daily 06/05/15  Yes [name], MD  glucose blood test strip Use as instructed 03/06/15  Yes [name], MD  Lancets 28G MISC 1 each by Does not apply route daily. 03/06/15  Yes [name], MD  lisinopril (PRINIVIL,ZESTRIL) 40 MG tablet Take 1 tablet by mouth two  times daily 10/14/14  Yes [name], MD  metFORMIN (GLUCOPHAGE) 500 MG tablet Take 1 tablet by mouth 2 (two) times daily. 08/17/14  Yes Historical Provider, MD  metoprolol tartrate (LOPRESSOR) 25 MG tablet Take 1 tablet by mouth two  times daily 10/14/14  Yes [name], MD  pantoprazole (PROTONIX) 40 MG tablet Take 1 tablet by mouth  daily 10/14/14  Yes [name], MD  simvastatin (ZOCOR) 20 MG tablet Take 1 tablet by mouth at  bedtime 10/14/14  Yes [name], MD    No Known Allergies  Past Surgical History  Procedure Laterality Date  . Cataract extraction w/phaco Left 09/13/2014    Procedure: CATARACT EXTRACTION PHACO AND INTRAOCULAR LENS PLACEMENT (IOC);  Surgeon: Birder Robson, MD;  Location: [facility] ORS;  Service: Ophthalmology;  Laterality: Left;  [place] 01:03   . Popliteal artery angioplasty Left 2014  . Ptca Right     leg  . Colonoscopy  2014  . Upper gastrointestinal endoscopy  2014    Social History  Substance Use Topics  . Smoking status: Current Every Day Smoker -- 0.50 packs/day    Types: Cigarettes  . Smokeless tobacco: Never Used  . Alcohol Use: No     Medication list has been reviewed and updated.   Physical Exam  Constitutional: He appears well-developed and well-nourished. No distress.  Neck: Normal range of motion. Carotid bruit is not present. No thyromegaly present.    Cardiovascular: Normal rate, regular rhythm and normal heart sounds.   Pulses:      Posterior tibial pulses are 1+ on the right side, and 1+ on the left side.  Pulmonary/Chest: Effort normal and breath sounds normal.  Musculoskeletal:       Right hip: Normal.       Left hip: He exhibits decreased range of motion. He exhibits no tenderness and no bony tenderness.       Right knee: Normal.       Left knee: Normal.  Neurological: Coordination and gait normal.  Reflex Scores:      Tricep reflexes are 2+ on the right side and 2+ on the left side.      Patellar reflexes are 2+ on the right side and 2+ on the left side. Mild decreased strength 4+/5 in quad and hamstrings on left.  5/5 plantar flexion and extension bilaterally.  Skin: Skin is warm and dry.  Psychiatric: His speech is normal. His affect is blunt.  Nursing note and vitals reviewed.   BP 178/88 mmHg  Pulse 72  Temp(Src) 98.5 F (36.9 C)  Wt 135 lb (61.236 kg)  SpO2 99%  Assessment and Plan: 1. Type 2 diabetes mellitus with other diabetic  neurological complication (HCC) Continue current therapy - Hemoglobin A1c  2. DM type 2 with diabetic mixed hyperlipidemia ([place]) On statin therapy  3. 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  Recommendations re-enforced today  4. Essential (primary) hypertension controlled  5. Chronic lymphocytic leukemia ([place]) Followed by Oncology  6. Diabetic peripheral neuropathy associated with type 2 diabetes mellitus (HCC) Not painful  7. Anemia, pernicious Continue B12 - vitamin B-12 (CYANOCOBALAMIN) 100 MCG tablet; Take 1 tablet (100 mcg total) by mouth daily. Reported on 07/04/2015  Dispense: 30 tablet; Refill: 12  8. [name] (chronic lymphocytic leukemia) (HCC) Followed by oncology - CBC with Differential/Platelet - Comprehensive metabolic  panel  9. Hip pain, left Affecting gait - recommend tylenol as needed Use cane if needed for balance   Halina Maidens, MD [facility]  07/04/2015

## 2015-07-05 LAB — HEMOGLOBIN A1C
Est. average glucose Bld gHb Est-mCnc: 131 mg/dL
Hgb A1c MFr Bld: 6.2 % — ABNORMAL HIGH (ref 4.8–5.6)

## 2015-07-06 ENCOUNTER — Telehealth: Payer: Self-pay

## 2015-07-06 NOTE — Telephone Encounter (Signed)
Spoke with patient. Patient advised of all results and verbalized understanding. Will call back with any future questions or concerns. MAH  

## 2015-07-06 NOTE — Telephone Encounter (Signed)
-----   Message from [name], MD sent at 07/05/2015  5:02 PM EDT ----- DM is good.

## 2015-07-24 ENCOUNTER — Encounter: Payer: Self-pay | Admitting: Internal Medicine

## 2015-07-24 ENCOUNTER — Ambulatory Visit (INDEPENDENT_AMBULATORY_CARE_PROVIDER_SITE_OTHER): Payer: Medicare Other | Admitting: Internal Medicine

## 2015-07-24 VITALS — BP 147/88 | HR 76 | Ht 68.0 in | Wt 139.4 lb

## 2015-07-24 DIAGNOSIS — I70208 Unspecified atherosclerosis of native arteries of extremities, other extremity: Secondary | ICD-10-CM | POA: Diagnosis not present

## 2015-07-24 DIAGNOSIS — R0602 Shortness of breath: Secondary | ICD-10-CM

## 2015-07-24 NOTE — Progress Notes (Signed)
Date:  07/24/2015   Name:  Manuel Randolph   DOB:  [birth year]   MRN:  [id number]   Chief Complaint: Fatigue Diabetes Pertinent negatives for hypoglycemia include no dizziness, headaches or tremors. Associated symptoms include fatigue. Pertinent negatives for diabetes include no chest pain.  Hypertension Associated symptoms include shortness of breath. Pertinent negatives include no chest pain, headaches or palpitations.   Fatigue - Patient states that he doesn't get around very well and is wanting assistance with cleaning his house and cooking his meals. Patient states that he gives out easy when trying to complete these tasks.  He gets short of breath just making up the bed.  He can't cook, clean or do laundry without problems.  He denies chest pains or dizziness but has known PVD.  He has never had a cardiovascular stress test.  He has seen Dr. Lavera Guise. He was told by his insurance company that I could order him help at home.  I assured him that this is not the case as he still drives and walks unassisted.  I want to rule out CAD as a cause of his fatigue.  Review of Systems  Constitutional: Positive for fatigue. Negative for chills and unexpected weight change.  HENT: Negative for tinnitus and trouble swallowing.   Respiratory: Positive for shortness of breath. Negative for cough, chest tightness and wheezing.   Cardiovascular: Negative for chest pain, palpitations and leg swelling.  Gastrointestinal: Negative for vomiting, abdominal pain and constipation.  Musculoskeletal: Positive for arthralgias and gait problem. Negative for myalgias and joint swelling.  Neurological: Negative for dizziness, tremors, numbness and headaches.  Psychiatric/Behavioral: Negative for sleep disturbance.    Patient Active Problem List   Diagnosis Date Noted  . Anemia, pernicious 07/04/2015  . Hip pain 07/04/2015  . Cardiac murmur 02/19/2015  . Chronic lymphocytic leukemia ([place]) 02/19/2015  .  Type 2 diabetes mellitus with other diabetic neurological complication ([place]) 0000000  . Essential (primary) hypertension 02/19/2015  . DM type 2 with diabetic mixed hyperlipidemia ([place]) 02/19/2015  . Diabetic peripheral neuropathy associated with type 2 diabetes mellitus ([place]) 02/19/2015  . Bleeding ulcer 02/19/2015  . Peripheral vascular disease ([place]) 02/19/2015  . 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 Encounter for screening for malignant neoplasm of prostate 02/19/2015  . Impaired renal function 02/19/2015  . Compulsive tobacco user syndrome 02/19/2015  . 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  . Aortic valve defect 12/22/2012  . 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 Authorizing Provider  aspirin 81 MG tablet Take 1 tablet by mouth daily.   Yes Historical Provider, MD  Blood Glucose Monitoring Suppl (ACCU-CHEK AVIVA) device Use as instructed 03/06/15 03/05/16 Yes [name], MD  gabapentin (NEURONTIN) 300 MG capsule Take 1 capsule by mouth two times daily 06/05/15  Yes [name], MD  glucose blood test strip Use as instructed 03/06/15  Yes [name], MD  Lancets 28G MISC 1 each by Does not apply route daily. 03/06/15  Yes [name], MD  lisinopril (PRINIVIL,ZESTRIL) 40 MG tablet Take 1 tablet by mouth two  times daily 10/14/14  Yes [name], MD  metFORMIN (GLUCOPHAGE) 500 MG tablet Take 1 tablet by mouth 2 (two) times daily. 08/17/14  Yes Historical Provider, MD  metoprolol tartrate (LOPRESSOR) 25 MG tablet Take 1 tablet by mouth two  times daily 10/14/14  Yes [name], MD  pantoprazole (PROTONIX) 40 MG tablet Take 1 tablet by mouth  daily 10/14/14  Yes [name], MD  simvastatin (ZOCOR) 20 MG tablet Take 1 tablet by mouth at  bedtime 10/14/14  Yes [name], MD  vitamin B-12 (CYANOCOBALAMIN) 100 MCG  tablet Take 1 tablet (100 mcg total) by mouth daily. Reported on 07/04/2015 07/04/15  Yes [name], MD    No Known Allergies  Past Surgical History  Procedure Laterality Date  . Cataract extraction w/phaco Left 09/13/2014    Procedure: CATARACT EXTRACTION PHACO AND INTRAOCULAR LENS PLACEMENT (IOC);  Surgeon: Birder Robson, MD;  Location: [facility] ORS;  Service: Ophthalmology;  Laterality: Left;  [place] 01:03   . Popliteal artery angioplasty Left 2014  . Ptca Right     leg  . Colonoscopy  2014  . Upper gastrointestinal endoscopy  2014    Social History  Substance Use Topics  . Smoking status: Current Every Day Smoker -- 0.50 packs/day    Types: Cigarettes  . Smokeless tobacco: Never Used  . Alcohol Use: No     Medication list has been reviewed and updated.   Physical Exam  Constitutional: He is oriented to person, place, and time. He appears well-developed. No distress.  HENT:  Head: Normocephalic and atraumatic.  Neck: Normal range of motion. No thyromegaly present.  Cardiovascular: Normal rate, regular rhythm and normal heart sounds.   Pulmonary/Chest: Effort normal and breath sounds normal. No respiratory distress.  Musculoskeletal: He exhibits no edema or tenderness.  Lymphadenopathy:    He has no cervical adenopathy.  Neurological: He is alert and oriented to person, place, and time.  Skin: Skin is warm and dry. No rash noted.  Psychiatric: His speech is normal and behavior is normal. Thought content normal. His affect is blunt.  Nursing note and vitals reviewed.   BP 147/88 mmHg  Pulse 76  Ht 5\' 8"  (1.727 m)  Wt 139 lb 6.4 oz (63.231 kg)  BMI 21.20 kg/m2  Assessment and Plan: 1. Shortness of breath Concern for CAD given PVD and HTN and DM Will refer back to Dr. Lavera Guise whom he has seen in the past Recommend he contact [employer] to see if he qualifies - he does not qualify for in home care.  2. 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

## 2015-07-26 DIAGNOSIS — I739 Peripheral vascular disease, unspecified: Secondary | ICD-10-CM | POA: Diagnosis not present

## 2015-07-26 DIAGNOSIS — I10 Essential (primary) hypertension: Secondary | ICD-10-CM | POA: Diagnosis not present

## 2015-07-26 DIAGNOSIS — I119 Hypertensive heart disease without heart failure: Secondary | ICD-10-CM | POA: Diagnosis not present

## 2015-07-26 DIAGNOSIS — E119 Type 2 diabetes mellitus without complications: Secondary | ICD-10-CM | POA: Diagnosis not present

## 2015-07-27 DIAGNOSIS — R000 Tachycardia, unspecified: Secondary | ICD-10-CM | POA: Diagnosis not present

## 2015-07-27 DIAGNOSIS — R011 Cardiac murmur, unspecified: Secondary | ICD-10-CM | POA: Diagnosis not present

## 2015-07-27 DIAGNOSIS — I350 Nonrheumatic aortic (valve) stenosis: Secondary | ICD-10-CM | POA: Diagnosis not present

## 2015-07-27 DIAGNOSIS — R0609 Other forms of dyspnea: Secondary | ICD-10-CM | POA: Diagnosis not present

## 2015-07-28 DIAGNOSIS — I119 Hypertensive heart disease without heart failure: Secondary | ICD-10-CM | POA: Diagnosis not present

## 2015-07-28 DIAGNOSIS — I350 Nonrheumatic aortic (valve) stenosis: Secondary | ICD-10-CM | POA: Diagnosis not present

## 2015-07-28 DIAGNOSIS — E119 Type 2 diabetes mellitus without complications: Secondary | ICD-10-CM | POA: Diagnosis not present

## 2015-07-28 DIAGNOSIS — R011 Cardiac murmur, unspecified: Secondary | ICD-10-CM | POA: Diagnosis not present

## 2015-08-15 ENCOUNTER — Encounter: Payer: Self-pay | Admitting: Emergency Medicine

## 2015-08-15 ENCOUNTER — Emergency Department
Admission: EM | Admit: 2015-08-15 | Discharge: 2015-08-15 | Disposition: A | Discharge status: Home / Self Care | Payer: No Typology Code available for payment source | Attending: Emergency Medicine | Admitting: Emergency Medicine

## 2015-08-15 ENCOUNTER — Emergency Department: Payer: No Typology Code available for payment source

## 2015-08-15 DIAGNOSIS — E1149 Type 2 diabetes mellitus with other diabetic neurological complication: Secondary | ICD-10-CM | POA: Insufficient documentation

## 2015-08-15 DIAGNOSIS — I2510 Atherosclerotic heart disease of native coronary artery without angina pectoris: Secondary | ICD-10-CM | POA: Insufficient documentation

## 2015-08-15 DIAGNOSIS — E1142 Type 2 diabetes mellitus with diabetic polyneuropathy: Secondary | ICD-10-CM | POA: Diagnosis not present

## 2015-08-15 DIAGNOSIS — Z79899 Other long term (current) drug therapy: Secondary | ICD-10-CM | POA: Insufficient documentation

## 2015-08-15 DIAGNOSIS — Y92410 Unspecified street and highway as the place of occurrence of the external cause: Secondary | ICD-10-CM | POA: Insufficient documentation

## 2015-08-15 DIAGNOSIS — Z856 Personal history of leukemia: Secondary | ICD-10-CM | POA: Insufficient documentation

## 2015-08-15 DIAGNOSIS — E1151 Type 2 diabetes mellitus with diabetic peripheral angiopathy without gangrene: Secondary | ICD-10-CM | POA: Insufficient documentation

## 2015-08-15 DIAGNOSIS — S39012A Strain of muscle, fascia and tendon of lower back, initial encounter: Secondary | ICD-10-CM | POA: Diagnosis not present

## 2015-08-15 DIAGNOSIS — Y999 Unspecified external cause status: Secondary | ICD-10-CM | POA: Diagnosis not present

## 2015-08-15 DIAGNOSIS — E785 Hyperlipidemia, unspecified: Secondary | ICD-10-CM | POA: Insufficient documentation

## 2015-08-15 DIAGNOSIS — Z7984 Long term (current) use of oral hypoglycemic drugs: Secondary | ICD-10-CM | POA: Diagnosis not present

## 2015-08-15 DIAGNOSIS — Y9389 Activity, other specified: Secondary | ICD-10-CM | POA: Diagnosis not present

## 2015-08-15 DIAGNOSIS — F17210 Nicotine dependence, cigarettes, uncomplicated: Secondary | ICD-10-CM | POA: Diagnosis not present

## 2015-08-15 DIAGNOSIS — S3992XA Unspecified injury of lower back, initial encounter: Secondary | ICD-10-CM | POA: Diagnosis not present

## 2015-08-15 DIAGNOSIS — Z7982 Long term (current) use of aspirin: Secondary | ICD-10-CM | POA: Diagnosis not present

## 2015-08-15 DIAGNOSIS — I10 Essential (primary) hypertension: Secondary | ICD-10-CM | POA: Insufficient documentation

## 2015-08-15 MED ORDER — ACETAMINOPHEN-CODEINE #3 300-30 MG PO TABS: 1.0000 | ORAL_TABLET | Freq: Every evening | ORAL | Status: DC | PRN
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
## 2015-08-21 ENCOUNTER — Other Ambulatory Visit: Payer: Medicare Other

## 2015-08-21 ENCOUNTER — Other Ambulatory Visit: Payer: Self-pay | Admitting: *Deleted

## 2015-08-21 ENCOUNTER — Ambulatory Visit: Payer: Medicare Other | Admitting: Family Medicine

## 2015-08-21 DIAGNOSIS — C9110 Chronic lymphocytic leukemia of B-cell type not having achieved remission: Secondary | ICD-10-CM

## 2015-08-22 ENCOUNTER — Inpatient Hospital Stay: Payer: Medicare Other

## 2015-08-22 ENCOUNTER — Inpatient Hospital Stay: Payer: Medicare Other | Admitting: Family Medicine

## 2015-08-23 ENCOUNTER — Other Ambulatory Visit: Payer: Self-pay

## 2015-09-01 ENCOUNTER — Inpatient Hospital Stay: Payer: Medicare Other | Attending: Family Medicine

## 2015-09-01 ENCOUNTER — Inpatient Hospital Stay (HOSPITAL_BASED_OUTPATIENT_CLINIC_OR_DEPARTMENT_OTHER): Payer: Medicare Other | Admitting: Family Medicine

## 2015-09-01 VITALS — BP 171/74 | HR 59 | Temp 97.2°F | Resp 18 | Wt 143.3 lb

## 2015-09-01 DIAGNOSIS — E119 Type 2 diabetes mellitus without complications: Secondary | ICD-10-CM | POA: Diagnosis not present

## 2015-09-01 DIAGNOSIS — D696 Thrombocytopenia, unspecified: Secondary | ICD-10-CM

## 2015-09-01 DIAGNOSIS — F17210 Nicotine dependence, cigarettes, uncomplicated: Secondary | ICD-10-CM | POA: Diagnosis not present

## 2015-09-01 DIAGNOSIS — Z79899 Other long term (current) drug therapy: Secondary | ICD-10-CM | POA: Insufficient documentation

## 2015-09-01 DIAGNOSIS — E785 Hyperlipidemia, unspecified: Secondary | ICD-10-CM | POA: Diagnosis not present

## 2015-09-01 DIAGNOSIS — Z7984 Long term (current) use of oral hypoglycemic drugs: Secondary | ICD-10-CM | POA: Diagnosis not present

## 2015-09-01 DIAGNOSIS — C9110 Chronic lymphocytic leukemia of B-cell type not having achieved remission: Secondary | ICD-10-CM | POA: Diagnosis not present

## 2015-09-01 DIAGNOSIS — Z7982 Long term (current) use of aspirin: Secondary | ICD-10-CM | POA: Insufficient documentation

## 2015-09-01 DIAGNOSIS — I10 Essential (primary) hypertension: Secondary | ICD-10-CM | POA: Insufficient documentation

## 2015-09-01 LAB — CBC WITH DIFFERENTIAL/PLATELET
BASOS PCT: 1 %
Basophils Absolute: 0.1 10*3/uL (ref 0–0.1)
EOS ABS: 0.1 10*3/uL (ref 0–0.7)
Eosinophils Relative: 1 %
HEMATOCRIT: 38.3 % — AB (ref 40.0–52.0)
Hemoglobin: 12.7 g/dL — ABNORMAL LOW (ref 13.0–18.0)
Lymphocytes Relative: 59 %
Lymphs Abs: 9.2 10*3/uL — ABNORMAL HIGH (ref 1.0–3.6)
MCH: 30.5 pg (ref 26.0–34.0)
MCHC: 33.1 g/dL (ref 32.0–36.0)
MCV: 92 fL (ref 80.0–100.0)
MONO ABS: 0.7 10*3/uL (ref 0.2–1.0)
MONOS PCT: 5 %
NEUTROS ABS: 5.2 10*3/uL (ref 1.4–6.5)
Neutrophils Relative %: 34 %
Platelets: 149 10*3/uL — ABNORMAL LOW (ref 150–440)
RBC: 4.16 MIL/uL — ABNORMAL LOW (ref 4.40–5.90)
RDW: 15 % — AB (ref 11.5–14.5)
WBC: 15.4 10*3/uL — ABNORMAL HIGH (ref 3.8–10.6)

## 2015-09-01 LAB — BASIC METABOLIC PANEL
Anion gap: 7 (ref 5–15)
BUN: 28 mg/dL — ABNORMAL HIGH (ref 6–20)
CALCIUM: 9.4 mg/dL (ref 8.9–10.3)
CO2: 24 mmol/L (ref 22–32)
CREATININE: 1.55 mg/dL — AB (ref 0.61–1.24)
Chloride: 105 mmol/L (ref 101–111)
GFR calc Af Amer: 51 mL/min — ABNORMAL LOW (ref >60)
GFR calc non Af Amer: 44 mL/min — ABNORMAL LOW (ref >60)
GLUCOSE: 184 mg/dL — AB (ref 65–99)
Potassium: 4.3 mmol/L (ref 3.5–5.1)
Sodium: 136 mmol/L (ref 135–145)

## 2015-09-01 NOTE — Progress Notes (Signed)
Manuel  [phone]  [phone]     Manuel POSTLEWAIT Sr. DOB: 11/23/1945  MR#: [id number]  UXN#:[id number]  Patient Care Team: [name], MD as PCP - General (Family Medicine)  CHIEF COMPLAINT: [name]  INTERVAL HISTORY:  Patient is here for further follow-up regarding RAI stage 0 [name] diagnosed in 2014. Patient has been followed conservatively without intervention. Patient denies any acute illnesses or hospitalizations since his last visit 6 months ago. He does report having continued issues with left leg weakness and foot drop, he was followed by vascular surgery and underwent revascularization procedure that he feels did not help. He denies any fever, chills, night sweats, chest pain, shortness of breath, diarrhea or constipation.  REVIEW OF SYSTEMS:   Review of Systems  Constitutional: Negative for fever, chills, weight loss, malaise/fatigue and diaphoresis.  HENT: Negative.   Eyes: Negative.   Respiratory: Negative for cough, hemoptysis, sputum production, shortness of breath and wheezing.   Cardiovascular: Positive for claudication. Negative for chest pain, palpitations, orthopnea, leg swelling and PND.       In the left leg, has been followed previously by vascular surgery.  Gastrointestinal: Negative for heartburn, nausea, vomiting, abdominal pain, diarrhea, constipation, blood in stool and melena.  Genitourinary: Negative.   Musculoskeletal: Negative.   Skin: Negative.   Neurological: Negative for dizziness, tingling, focal weakness, seizures and weakness.  Endo/Heme/Allergies: Does not bruise/bleed easily.  Psychiatric/Behavioral: Negative for depression. The patient is not nervous/anxious and does not have insomnia.     As per HPI. Otherwise, a complete review of systems is negatve.   PAST MEDICAL HISTORY: Past Medical History  Diagnosis Date  . Hypertension   . Hyperlipidemia   . Diabetes ([place])   . Anemia     PAST  SURGICAL HISTORY: Past Surgical History  Procedure Laterality Date  . Cataract extraction w/phaco Left 09/13/2014    Procedure: CATARACT EXTRACTION PHACO AND INTRAOCULAR LENS PLACEMENT (IOC);  Surgeon: Birder Robson, MD;  Location: [facility] ORS;  Service: Ophthalmology;  Laterality: Left;  [place] 01:03   . Popliteal artery angioplasty Left 2014  . Ptca Right     leg  . Colonoscopy  2014  . Upper gastrointestinal endoscopy  2014    FAMILY HISTORY Family History  Problem Relation Age of Onset  . Diabetes Mother   . Diabetes Father     GYNECOLOGIC HISTORY:  No LMP for male patient.     ADVANCED DIRECTIVES:    HEALTH MAINTENANCE: Social History  Substance Use Topics  . Smoking status: Current Every Day Smoker -- 0.50 packs/day    Types: Cigarettes  . Smokeless tobacco: Never Used  . Alcohol Use: No      No Known Allergies  Current Outpatient Prescriptions  Medication Sig Dispense Refill  . acetaminophen-codeine (TYLENOL #3) 300-30 MG tablet Take 1 tablet by mouth at bedtime as needed for moderate pain. 10 tablet 0  . aspirin 81 MG tablet Take 1 tablet by mouth daily.    . Blood Glucose Monitoring Suppl (ACCU-CHEK AVIVA) device Use as instructed 1 each 0  . gabapentin (NEURONTIN) 300 MG capsule Take 1 capsule by mouth two times daily 180 capsule 0  . glucose blood test strip Use as instructed 100 each 12  . Lancets 28G MISC 1 each by Does not apply route daily. 100 each 3  . lisinopril (PRINIVIL,ZESTRIL) 40 MG tablet Take 1 tablet by mouth two  times daily 180 tablet 3  . metFORMIN (GLUCOPHAGE) 500  MG tablet Take 1 tablet by mouth 2 (two) times daily.    . metoprolol tartrate (LOPRESSOR) 25 MG tablet Take 1 tablet by mouth two  times daily 180 tablet 3  . pantoprazole (PROTONIX) 40 MG tablet Take 1 tablet by mouth  daily 90 tablet 3  . simvastatin (ZOCOR) 20 MG tablet Take 1 tablet by mouth at  bedtime 90 tablet 3  . vitamin B-12 (CYANOCOBALAMIN) 100 MCG tablet Take 1 tablet  (100 mcg total) by mouth daily. Reported on 07/04/2015 30 tablet 12   No current facility-administered medications for this visit.    OBJECTIVE: There were no vitals taken for this visit.   There is no weight on file to calculate BMI.    ECOG FS:0 - Asymptomatic  General: Well-developed, well-nourished, no acute distress. Eyes: Pink conjunctiva, anicteric sclera. HEENT: Normocephalic, moist mucous membranes, clear oropharnyx. Lungs: Clear to auscultation bilaterally. Heart: Regular rate and rhythm. No rubs, murmurs, or gallops. Abdomen: Soft, nontender, nondistended. No organomegaly noted, normoactive bowel sounds. Musculoskeletal: No edema, cyanosis, or clubbing. Neuro: Alert, answering all questions appropriately. Cranial nerves grossly intact. Skin: No rashes or petechiae noted. Psych: Normal affect. Lymphatics: No cervical, clavicular, or axillary LAD.   LAB RESULTS:  Appointment on 09/01/2015  Component Date Value Ref Range Status  . Sodium 09/01/2015 136  135 - 145 mmol/L Final  . Potassium 09/01/2015 4.3  3.5 - 5.1 mmol/L Final  . Chloride 09/01/2015 105  101 - 111 mmol/L Final  . CO2 09/01/2015 24  22 - 32 mmol/L Final  . Glucose, Bld 09/01/2015 184* 65 - 99 mg/dL Final  . BUN 09/01/2015 28* 6 - 20 mg/dL Final  . Creatinine, Ser 09/01/2015 1.55* 0.61 - 1.24 mg/dL Final  . Calcium 09/01/2015 9.4  8.9 - 10.3 mg/dL Final  . GFR calc non Af Amer 09/01/2015 44* >60 mL/min Final  . GFR calc Af Amer 09/01/2015 51* >60 mL/min Final   Comment: (NOTE) The eGFR has been calculated using the CKD EPI equation. This calculation has not been validated in all clinical situations. eGFR's persistently <60 mL/min signify possible Chronic Kidney Disease.   . Anion gap 09/01/2015 7  5 - 15 Final  . WBC 09/01/2015 15.4* 3.8 - 10.6 K/uL Final  . RBC 09/01/2015 4.16* 4.40 - 5.90 MIL/uL Final  . Hemoglobin 09/01/2015 12.7* 13.0 - 18.0 g/dL Final  . HCT 09/01/2015 38.3* 40.0 - 52.0 % Final    . MCV 09/01/2015 92.0  80.0 - 100.0 fL Final  . MCH 09/01/2015 30.5  26.0 - 34.0 pg Final  . MCHC 09/01/2015 33.1  32.0 - 36.0 g/dL Final  . RDW 09/01/2015 15.0* 11.5 - 14.5 % Final  . Platelets 09/01/2015 149* 150 - 440 K/uL Final  . Neutrophils Relative % 09/01/2015 34   Final  . Neutro Abs 09/01/2015 5.2  1.4 - 6.5 K/uL Final  . Lymphocytes Relative 09/01/2015 59   Final  . Lymphs Abs 09/01/2015 9.2* 1.0 - 3.6 K/uL Final  . Monocytes Relative 09/01/2015 5   Final  . Monocytes Absolute 09/01/2015 0.7  0.2 - 1.0 K/uL Final  . Eosinophils Relative 09/01/2015 1   Final  . Eosinophils Absolute 09/01/2015 0.1  0 - 0.7 K/uL Final  . Basophils Relative 09/01/2015 1   Final  . Basophils Absolute 09/01/2015 0.1  0 - 0.1 K/uL Final    STUDIES: No results found.  ASSESSMENT:  [name], Rai stage 0.  PLAN:  1. [name]. Patient was originally diagnosed  in 2014 and has been under observation only. His blood counts remain stable including WBC of 15.4, platelets 149,000, hemoglobin 12.7. At his last visit quantitative immunoglobulins and FISH panel were performed. 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 2. Mild thrombocytopenia. Thrombocytopenia could be secondary to ITP or bone marrow involvement with [name]. Due to stability of hematologic parameters we will not perform any additional evaluation. Patient does continue to smoke and was encouraged to consider low dose lung CT screening.  Patient will return to clinic in approximately 6 months for further lab evaluation and provider visit. Patient expressed understanding and was in agreement with this plan. He also understands that He can call clinic at any time with any questions, concerns, or complaints.   Dr. Rogue Bussing was available for consultation and review of plan of care for this patient.  No matching staging information was found for the patient.  Evlyn Kanner, NP   09/01/2015 3:26 PM

## 2015-09-01 NOTE — Progress Notes (Signed)
Offers no complaints  

## 2015-09-15 ENCOUNTER — Other Ambulatory Visit: Payer: Self-pay | Admitting: Internal Medicine

## 2015-10-02 ENCOUNTER — Other Ambulatory Visit: Payer: Self-pay | Admitting: Internal Medicine

## 2015-10-24 ENCOUNTER — Encounter: Payer: Self-pay | Admitting: Internal Medicine

## 2015-10-24 ENCOUNTER — Ambulatory Visit (INDEPENDENT_AMBULATORY_CARE_PROVIDER_SITE_OTHER): Payer: Medicare Other | Admitting: Internal Medicine

## 2015-10-24 VITALS — BP 140/80 | HR 60 | Ht 68.0 in | Wt 140.6 lb

## 2015-10-24 DIAGNOSIS — C9110 Chronic lymphocytic leukemia of B-cell type not having achieved remission: Secondary | ICD-10-CM

## 2015-10-24 DIAGNOSIS — D510 Vitamin B12 deficiency anemia due to intrinsic factor deficiency: Secondary | ICD-10-CM | POA: Diagnosis not present

## 2015-10-24 DIAGNOSIS — I739 Peripheral vascular disease, unspecified: Secondary | ICD-10-CM | POA: Diagnosis not present

## 2015-10-24 DIAGNOSIS — E1142 Type 2 diabetes mellitus with diabetic polyneuropathy: Secondary | ICD-10-CM

## 2015-10-24 DIAGNOSIS — E1169 Type 2 diabetes mellitus with other specified complication: Secondary | ICD-10-CM

## 2015-10-24 DIAGNOSIS — K274 Chronic or unspecified peptic ulcer, site unspecified, with hemorrhage: Secondary | ICD-10-CM | POA: Diagnosis not present

## 2015-10-24 DIAGNOSIS — K284 Chronic or unspecified gastrojejunal ulcer with hemorrhage: Secondary | ICD-10-CM

## 2015-10-24 DIAGNOSIS — E782 Mixed hyperlipidemia: Secondary | ICD-10-CM | POA: Diagnosis not present

## 2015-10-24 DIAGNOSIS — N289 Disorder of kidney and ureter, unspecified: Secondary | ICD-10-CM

## 2015-10-24 DIAGNOSIS — I10 Essential (primary) hypertension: Secondary | ICD-10-CM | POA: Diagnosis not present

## 2015-10-24 MED ORDER — LISINOPRIL 40 MG PO TABS: 40.0000 mg | ORAL_TABLET | Freq: Two times a day (BID) | ORAL | 3 refills | Status: DC

## 2015-10-24 MED ORDER — SIMVASTATIN 20 MG PO TABS: 20.0000 mg | ORAL_TABLET | Freq: Every day | ORAL | 3 refills | Status: DC

## 2015-10-24 MED ORDER — METOPROLOL TARTRATE 25 MG PO TABS: 25.0000 mg | ORAL_TABLET | Freq: Two times a day (BID) | ORAL | 3 refills | Status: DC

## 2015-10-24 MED ORDER — PANTOPRAZOLE SODIUM 40 MG PO TBEC: 40.0000 mg | DELAYED_RELEASE_TABLET | Freq: Every day | ORAL | 3 refills | Status: DC

## 2015-10-24 MED ORDER — VITAMIN B12 100 MCG PO TABS: 100.0000 ug | ORAL_TABLET | Freq: Every day | ORAL | 3 refills | Status: DC

## 2015-10-24 MED ORDER — METFORMIN HCL 500 MG PO TABS: 500.0000 mg | ORAL_TABLET | Freq: Two times a day (BID) | ORAL | 0 refills | Status: DC

## 2015-10-24 NOTE — Progress Notes (Signed)
Date:  10/24/2015   Name:  Manuel Randolph   DOB:  11-28-1945   MRN:  [id number]   Chief Complaint: Diabetes and Hyperlipidemia Diabetes  He presents for his follow-up diabetic visit. He has type 2 diabetes mellitus. His disease course has been stable. Pertinent negatives for hypoglycemia include no headaches or tremors. Pertinent negatives for diabetes include no chest pain, no fatigue, no polydipsia, no polyuria and no weakness. His breakfast blood glucose is taken between 7-8 am. His breakfast blood glucose range is generally 130-140 mg/dl.  Hyperlipidemia  This is a chronic problem. The current episode started more than 1 year ago. Associated symptoms include myalgias. Pertinent negatives include no chest pain or shortness of breath.  Hypertension  This is a chronic problem. The current episode started more than 1 year ago. The problem is unchanged. The problem is controlled. Pertinent negatives include no chest pain, headaches, palpitations or shortness of breath.  Gastroesophageal Reflux  He reports no abdominal pain, no chest pain, no coughing or no wheezing. and history of bleeding. The problem occurs rarely. Pertinent negatives include no fatigue. He has tried a PPI for the symptoms. Past procedures include H. pylori antibody titer and a UGI.  PVD - worse in his left leg.  He has declines further intervention due to post op pain.  However, he is more limited in his ability to ambulate.  He continues to smoke cigarettes.  Lab Results  Component Value Date   HGBA1C 6.2 (H) 07/04/2015   Lab Results  Component Value Date   CREATININE 1.55 (H) 09/01/2015     Review of Systems  Constitutional: Negative for appetite change, fatigue and unexpected weight change.  Eyes: Negative for visual disturbance.  Respiratory: Negative for cough, shortness of breath and wheezing.   Cardiovascular: Negative for chest pain, palpitations and leg swelling.  Gastrointestinal: Negative for  abdominal pain and blood in stool.  Endocrine: Negative for polydipsia and polyuria.  Genitourinary: Negative for dysuria and hematuria.  Musculoskeletal: Positive for gait problem (limited distance) and myalgias.  Skin: Negative for color change and rash.  Neurological: Negative for tremors, weakness, numbness and headaches.  Psychiatric/Behavioral: Negative for dysphoric mood.    Patient Active Problem List   Diagnosis Date Noted  . Anemia, pernicious 07/04/2015  . Hip pain 07/04/2015  . Cardiac murmur 02/19/2015  . Chronic lymphocytic leukemia ([place]) 02/19/2015  . Type 2 diabetes mellitus with other diabetic neurological complication ([place]) 0000000  . Essential (primary) hypertension 02/19/2015  . DM type 2 with diabetic mixed hyperlipidemia ([place]) 02/19/2015  . Diabetic peripheral neuropathy associated with type 2 diabetes mellitus ([place]) 02/19/2015  . Bleeding ulcer 02/19/2015  . Peripheral vascular disease ([place]) 02/19/2015  . 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 Encounter for screening for malignant neoplasm of prostate 02/19/2015  . Impaired renal function 02/19/2015  . Compulsive tobacco user syndrome 02/19/2015  . 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  . Aortic valve defect 12/22/2012  . 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 Authorizing Provider  acetaminophen-codeine (TYLENOL #3) 300-30 MG tablet Take 1 tablet by mouth at bedtime as needed for moderate pain. 08/15/15  Yes Jenise V Bacon Menshew, PA-C  aspirin 81 MG tablet Take 1 tablet by mouth daily.   Yes Historical Provider, MD  Blood Glucose Monitoring Suppl (ACCU-CHEK AVIVA) device Use as instructed 03/06/15 03/05/16 Yes Jesse Sans  Army Melia, MD  gabapentin (NEURONTIN) 300 MG capsule Take 1 capsule by mouth two times daily 10/02/15  Yes [name], MD  glucose blood test strip Use  as instructed 03/06/15  Yes [name], MD  Lancets 28G MISC 1 each by Does not apply route daily. 03/06/15  Yes [name], MD  lisinopril (PRINIVIL,ZESTRIL) 40 MG tablet Take 1 tablet by mouth two  times daily 10/14/14  Yes [name], MD  metFORMIN (GLUCOPHAGE) 500 MG tablet Take 1 tablet by mouth two  times daily 09/15/15  Yes [name], MD  metoprolol tartrate (LOPRESSOR) 25 MG tablet Take 1 tablet by mouth two  times daily 10/14/14  Yes [name], MD  pantoprazole (PROTONIX) 40 MG tablet Take 1 tablet by mouth  daily 10/14/14  Yes [name], MD  simvastatin (ZOCOR) 20 MG tablet Take 1 tablet by mouth at  bedtime 10/14/14  Yes [name], MD  vitamin B-12 (CYANOCOBALAMIN) 100 MCG tablet Take 1 tablet (100 mcg total) by mouth daily. Reported on 07/04/2015 07/04/15  Yes [name], MD    No Known Allergies  Past Surgical History:  Procedure Laterality Date  . CATARACT EXTRACTION W/PHACO Left 09/13/2014   Procedure: CATARACT EXTRACTION PHACO AND INTRAOCULAR LENS PLACEMENT (IOC);  Surgeon: Birder Robson, MD;  Location: [facility] ORS;  Service: Ophthalmology;  Laterality: Left;  [place] 01:03   . COLONOSCOPY  2014  . POPLITEAL ARTERY ANGIOPLASTY Left 2014  . PTCA Right    leg  . UPPER GASTROINTESTINAL ENDOSCOPY  2014    Social History  Substance Use Topics  . Smoking status: Current Every Day Smoker    Packs/day: 0.50    Types: Cigarettes  . Smokeless tobacco: Never Used  . Alcohol use No     Medication list has been reviewed and updated.   Physical Exam  Constitutional: He is oriented to person, place, and time. He appears well-developed. No distress.  HENT:  Head: Normocephalic and atraumatic.  Cardiovascular: Normal rate, regular rhythm and normal heart sounds.  Exam reveals decreased pulses.   Pulses:      Dorsalis pedis pulses are 0 on the right side, and 0 on the left side.  Pulmonary/Chest: Effort normal and breath sounds normal.    Neurological: He is alert and oriented to person, place, and time.  Skin: Skin is warm and dry. No rash noted.  Psychiatric: His speech is normal and behavior is normal. Thought content normal. His affect is blunt.  Nursing note and vitals reviewed.   BP 140/80 (BP Location: Left Arm, Patient Position: Sitting, Cuff Size: Normal)   Pulse 60   Ht 5\' 8"  (1.727 m)   Wt 140 lb 9.6 oz (63.8 kg)   BMI 21.38 kg/m   Assessment and Plan: 1. Essential (primary) hypertension Controlled - meds refilled - lisinopril (PRINIVIL,ZESTRIL) 40 MG tablet; Take 1 tablet (40 mg total) by mouth 2 (two) times daily.  Dispense: 180 tablet; Refill: 3 - metoprolol tartrate (LOPRESSOR) 25 MG tablet; Take 1 tablet (25 mg total) by mouth 2 (two) times daily.  Dispense: 180 tablet; Refill: 3  2. Peripheral vascular disease ([place]) Encourage follow up with Vascular specialist  3. Bleeding ulcer Continue daily PPI - pantoprazole (PROTONIX) 40 MG tablet; Take 1 tablet (40 mg total) by mouth daily.  Dispense: 90 tablet; Refill: 3  4. DM type 2 with diabetic mixed hyperlipidemia (HCC) Controlled DM - simvastatin (ZOCOR) 20 MG tablet; Take 1 tablet (  20 mg total) by mouth at bedtime.  Dispense: 90 tablet; Refill: 3 - metFORMIN (GLUCOPHAGE) 500 MG tablet; Take 1 tablet (500 mg total) by mouth 2 (two) times daily.  Dispense: 180 tablet; Refill: 0 - Hemoglobin 123456 - Basic metabolic panel  5. Diabetic peripheral neuropathy associated with type 2 diabetes mellitus (HCC) stable  6. Impaired renal function Continue to monitor  7. Chronic lymphocytic leukemia ([place]) Followed by Oncology - reportedly stable  8. Anemia, pernicious Continue oral B-12 - vitamin B-12 (CYANOCOBALAMIN) 100 MCG tablet; Take 1 tablet (100 mcg total) by mouth daily. Reported on 07/04/2015  Dispense: 90 tablet; Refill: [place], MD [facility]  10/24/2015

## 2015-10-25 LAB — BASIC METABOLIC PANEL
BUN/Creatinine Ratio: 15 (ref 10–24)
BUN: 21 mg/dL (ref 8–27)
CALCIUM: 9.1 mg/dL (ref 8.6–10.2)
CO2: 20 mmol/L (ref 18–29)
CREATININE: 1.39 mg/dL — AB (ref 0.76–1.27)
Chloride: 101 mmol/L (ref 96–106)
GFR, EST AFRICAN AMERICAN: 60 mL/min/{1.73_m2} (ref >59)
GFR, EST NON AFRICAN AMERICAN: 52 mL/min/{1.73_m2} — AB (ref >59)
Glucose: 104 mg/dL — ABNORMAL HIGH (ref 65–99)
Potassium: 5 mmol/L (ref 3.5–5.2)
SODIUM: 142 mmol/L (ref 134–144)

## 2015-10-25 LAB — HEMOGLOBIN A1C
Est. average glucose Bld gHb Est-mCnc: 148 mg/dL
Hgb A1c MFr Bld: 6.8 % — ABNORMAL HIGH (ref 4.8–5.6)

## 2015-10-27 DIAGNOSIS — I10 Essential (primary) hypertension: Secondary | ICD-10-CM | POA: Diagnosis not present

## 2015-10-27 DIAGNOSIS — I119 Hypertensive heart disease without heart failure: Secondary | ICD-10-CM | POA: Diagnosis not present

## 2015-10-27 DIAGNOSIS — E119 Type 2 diabetes mellitus without complications: Secondary | ICD-10-CM | POA: Diagnosis not present

## 2015-11-21 ENCOUNTER — Telehealth: Payer: Self-pay

## 2015-11-21 ENCOUNTER — Other Ambulatory Visit: Payer: Self-pay | Admitting: Internal Medicine

## 2015-11-21 MED ORDER — GABAPENTIN 300 MG PO CAPS: 300.0000 mg | ORAL_CAPSULE | Freq: Two times a day (BID) | ORAL | 0 refills | Status: DC

## 2015-11-21 NOTE — Telephone Encounter (Signed)
Gabapentin refill as her family may have taken it. I advised insurance may not cover this early he will call them also.

## 2015-11-22 ENCOUNTER — Other Ambulatory Visit: Payer: Self-pay | Admitting: Internal Medicine

## 2015-11-22 MED ORDER — GABAPENTIN 300 MG PO CAPS: 300.0000 mg | ORAL_CAPSULE | Freq: Two times a day (BID) | ORAL | 0 refills | Status: DC

## 2016-01-05 ENCOUNTER — Other Ambulatory Visit: Payer: Self-pay | Admitting: Internal Medicine

## 2016-01-05 DIAGNOSIS — E1169 Type 2 diabetes mellitus with other specified complication: Secondary | ICD-10-CM

## 2016-01-05 DIAGNOSIS — E782 Mixed hyperlipidemia: Principal | ICD-10-CM

## 2016-01-24 ENCOUNTER — Other Ambulatory Visit: Payer: Self-pay | Admitting: Internal Medicine

## 2016-01-24 MED ORDER — GABAPENTIN 300 MG PO CAPS: 300.0000 mg | ORAL_CAPSULE | Freq: Two times a day (BID) | ORAL | 1 refills | Status: DC

## 2016-01-28 ENCOUNTER — Encounter: Payer: Self-pay | Admitting: Emergency Medicine

## 2016-01-28 ENCOUNTER — Emergency Department
Admission: EM | Admit: 2016-01-28 | Discharge: 2016-01-28 | Disposition: A | Discharge status: Home / Self Care | Payer: Medicare Other | Attending: Emergency Medicine | Admitting: Emergency Medicine

## 2016-01-28 DIAGNOSIS — L0100 Impetigo, unspecified: Secondary | ICD-10-CM

## 2016-01-28 DIAGNOSIS — Y939 Activity, unspecified: Secondary | ICD-10-CM | POA: Insufficient documentation

## 2016-01-28 DIAGNOSIS — E119 Type 2 diabetes mellitus without complications: Secondary | ICD-10-CM | POA: Insufficient documentation

## 2016-01-28 DIAGNOSIS — Y929 Unspecified place or not applicable: Secondary | ICD-10-CM | POA: Diagnosis not present

## 2016-01-28 DIAGNOSIS — W57XXXA Bitten or stung by nonvenomous insect and other nonvenomous arthropods, initial encounter: Secondary | ICD-10-CM | POA: Insufficient documentation

## 2016-01-28 DIAGNOSIS — F17210 Nicotine dependence, cigarettes, uncomplicated: Secondary | ICD-10-CM | POA: Diagnosis not present

## 2016-01-28 DIAGNOSIS — R21 Rash and other nonspecific skin eruption: Secondary | ICD-10-CM

## 2016-01-28 DIAGNOSIS — Z7982 Long term (current) use of aspirin: Secondary | ICD-10-CM | POA: Insufficient documentation

## 2016-01-28 DIAGNOSIS — Y999 Unspecified external cause status: Secondary | ICD-10-CM | POA: Insufficient documentation

## 2016-01-28 DIAGNOSIS — I10 Essential (primary) hypertension: Secondary | ICD-10-CM | POA: Diagnosis not present

## 2016-01-28 DIAGNOSIS — Z7984 Long term (current) use of oral hypoglycemic drugs: Secondary | ICD-10-CM | POA: Insufficient documentation

## 2016-01-28 MED ORDER — MUPIROCIN 2 % EX OINT: TOPICAL_OINTMENT | CUTANEOUS | 0 refills | Status: DC

## 2016-01-28 MED ORDER — CEPHALEXIN 500 MG PO CAPS
500.0000 mg | ORAL_CAPSULE | Freq: Once | ORAL | Status: AC
  Administered 2016-01-28: 500 mg via ORAL
  Filled 2016-01-28: qty 1

## 2016-01-28 MED ORDER — CEPHALEXIN 500 MG PO CAPS: 500.0000 mg | ORAL_CAPSULE | Freq: Four times a day (QID) | ORAL | 0 refills | Status: AC

## 2016-01-28 NOTE — Discharge Instructions (Signed)
Please take medications as prescribed and return to the ER for any worsening symptoms or urgent changes in her health. Follow-up with primary care physician to 3 days for recheck.

## 2016-01-28 NOTE — ED Triage Notes (Signed)
States had his house sprayed for bed bugs 4 weeks, then rash on left arm started. Rash is weeping.

## 2016-01-28 NOTE — ED Provider Notes (Signed)
[facility] Provider Note   CSN: [id number] Arrival date & time: 01/28/16  1159     History   Chief Complaint Chief Complaint  Patient presents with  . Rash    HPI Manuel HERLOCKER Sr. is a 70 y.o. male presents to the emergency department for evaluation of skin rash to the left arm. Patient states 3 weeks ago had bedbug bites to his left arm. These had been resolving up until a few days ago he noticed a weeping and erythema around the lesions. Patient states he's had continued drainage of yellow with crusting over the lesions. He is currently getting his house treated for bedbugs. He denies any other rash throughout his body. No fevers. He describes some mild swelling and pain. He is not taking any medicines topically or orally for this rash.  HPI  Past Medical History:  Diagnosis Date  . Anemia   . Diabetes ([place])   . Hyperlipidemia   . Hypertension     Patient Active Problem List   Diagnosis Date Noted  . Anemia, pernicious 07/04/2015  . Hip pain 07/04/2015  . Cardiac murmur 02/19/2015  . Chronic lymphocytic leukemia ([place]) 02/19/2015  . Type 2 diabetes mellitus with other diabetic neurological complication 0000000  . Essential (primary) hypertension 02/19/2015  . DM type 2 with diabetic mixed hyperlipidemia ([place]) 02/19/2015  . Diabetic peripheral neuropathy associated with type 2 diabetes mellitus ([place]) 02/19/2015  . Bleeding ulcer 02/19/2015  . Peripheral vascular disease ([place]) 02/19/2015  . 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 Encounter for screening for malignant neoplasm of prostate 02/19/2015  . Impaired renal function 02/19/2015  . Compulsive tobacco user syndrome 02/19/2015  . 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  . Aortic valve defect 12/22/2012  . 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 CATARACT EXTRACTION W/PHACO Left  09/13/2014   Procedure: CATARACT EXTRACTION PHACO AND INTRAOCULAR LENS PLACEMENT (IOC);  Surgeon: Birder Robson, MD;  Location: [facility] ORS;  Service: Ophthalmology;  Laterality: Left;  [place] 01:03   . COLONOSCOPY  2014  . POPLITEAL ARTERY ANGIOPLASTY Left 2014  . PTCA Right    leg  . UPPER GASTROINTESTINAL ENDOSCOPY  2014       Home Medications    Prior to Admission medications   Medication Sig Start Date End Date Taking? Authorizing Provider  acetaminophen-codeine (TYLENOL #3) 300-30 MG tablet Take 1 tablet by mouth at bedtime as needed for moderate pain. 08/15/15   Jenise V Bacon Menshew, PA-C  aspirin 81 MG tablet Take 1 tablet by mouth daily.    Historical Provider, MD  Blood Glucose Monitoring Suppl (ACCU-CHEK AVIVA) device Use as instructed 03/06/15 03/05/16  [name], MD  cephALEXin (KEFLEX) 500 MG capsule Take 1 capsule (500 mg total) by mouth 4 (four) times daily. 01/28/16 02/04/16  Duanne Guess, PA-C  gabapentin (NEURONTIN) 300 MG capsule Take 1 capsule (300 mg total) by mouth 2 (two) times daily. 01/24/16   [name], MD  glucose blood test strip Use as instructed 03/06/15   [name], MD  Lancets 28G MISC 1 each by Does not apply route daily. 03/06/15   [name], MD  lisinopril (PRINIVIL,ZESTRIL) 40 MG tablet Take 1 tablet (40 mg total) by mouth 2 (two) times daily. 10/24/15   [name], MD  metFORMIN (GLUCOPHAGE) 500 MG tablet TAKE 1 TABLET BY MOUTH TWO  TIMES DAILY 01/05/16  [name], MD  metoprolol tartrate (LOPRESSOR) 25 MG tablet Take 1 tablet (25 mg total) by mouth 2 (two) times daily. 10/24/15   [name], MD  mupirocin ointment Drue Stager) 2 % Apply to affected area 3 times daily 01/28/16 01/27/17  Duanne Guess, PA-C  pantoprazole (PROTONIX) 40 MG tablet Take 1 tablet (40 mg total) by mouth daily. 10/24/15   [name], MD  simvastatin (ZOCOR) 20 MG tablet Take 1 tablet (20 mg total) by mouth at bedtime. 10/24/15    [name], MD  vitamin B-12 (CYANOCOBALAMIN) 100 MCG tablet Take 1 tablet (100 mcg total) by mouth daily. Reported on 07/04/2015 10/24/15   [name], MD    Family History Family History  Problem Relation Age of Onset  . Diabetes Mother   . Diabetes Father     Social History Social History  Substance Use Topics  . Smoking status: Current Every Day Smoker    Packs/day: 0.50    Types: Cigarettes  . Smokeless tobacco: Never Used  . Alcohol use No     Allergies   Review of patient's allergies indicates no known allergies.   Review of Systems Review of Systems  Constitutional: Negative for chills and fever.  HENT: Negative for ear pain and sore throat.   Eyes: Negative for pain and visual disturbance.  Respiratory: Negative for cough and shortness of breath.   Cardiovascular: Negative for chest pain and palpitations.  Gastrointestinal: Negative for abdominal pain and vomiting.  Genitourinary: Negative for dysuria and hematuria.  Musculoskeletal: Negative for arthralgias and back pain.  Skin: Positive for rash. Negative for color change.  Neurological: Negative for seizures and syncope.  All other systems reviewed and are negative.    Physical Exam Updated Vital Signs BP (!) 178/62   Pulse (!) 55   Temp 98.5 F (36.9 C) (Oral)   Resp 16   Ht 5\' 8"  (1.727 m)   Wt 65.8 kg   SpO2 99%   BMI 22.05 kg/m   Physical Exam  Constitutional: He appears well-developed and well-nourished.  HENT:  Head: Normocephalic and atraumatic.  Right Ear: External ear normal.  Left Ear: External ear normal.  Nose: Nose normal.  Mouth/Throat: Oropharynx is clear and moist. No oropharyngeal exudate.  Eyes: Conjunctivae and EOM are normal. Pupils are equal, round, and reactive to light.  Neck: Normal range of motion. Neck supple.  Cardiovascular: Normal rate and regular rhythm.   No murmur heard. Pulmonary/Chest: Effort normal and breath sounds normal. No respiratory  distress.  Abdominal: Soft. There is no tenderness.  Musculoskeletal: He exhibits no edema.  Examination of the left arm shows patient has 4 lesions, 3-5 cm in diameter with honey colored crust. Are is mild erythema and swelling around the lesions. There is no edema. He has full range of motion of the elbow wrist and digits. Sensation is intact.  Neurological: He is alert.  Skin: Skin is warm and dry.  Psychiatric: He has a normal mood and affect.  Nursing note and vitals reviewed.    ED Treatments / Results  Labs (all labs ordered are listed, but only abnormal results are displayed) Labs Reviewed - No data to display  EKG  EKG Interpretation None       Radiology No results found.  Procedures Procedures (including critical care time)  Medications Ordered in ED Medications  cephALEXin (KEFLEX) capsule 500 mg (500 mg Oral Given 01/28/16 1303)     Initial Impression / Assessment  and Plan / ED Course  I have reviewed the triage vital signs and the nursing notes.  Pertinent labs & imaging results that were available during my care of the patient were reviewed by me and considered in my medical decision making (see chart for details).  Clinical Course    70 year old male with left arm skin rash consistent with impetigo. He had lesions from bedbug bites 3 weeks ago that have recently developed mild erythema with a honey colored crust. He is placed on oral Keflex and topical bacitracin. He will follow-up with primary care physician to 3 days for recheck. Return to the ER for any worsening symptoms or urgent changes in his health.  Final Clinical Impressions(s) / ED Diagnoses   Final diagnoses:  Impetigo, unspecified  Rash    New Prescriptions New Prescriptions   CEPHALEXIN (KEFLEX) 500 MG CAPSULE    Take 1 capsule (500 mg total) by mouth 4 (four) times daily.   MUPIROCIN OINTMENT (BACTROBAN) 2 %    Apply to affected area 3 times daily     Duanne Guess,  PA-C 01/28/16 1311    Schuyler Amor, MD 01/28/16 1455

## 2016-02-09 ENCOUNTER — Other Ambulatory Visit: Payer: Self-pay

## 2016-02-09 NOTE — Telephone Encounter (Signed)
Patient called requesting refill on Gabapentin.  Advised refill on file with pharmacy.  Patient says he is taking 3-4 tabs daily and he thinks someone stole his meds. Advised patient Dr. Army Melia will not sign another Rx for med and he should keep his appt on 03/01/2016 w/ her. Patient agreed and verbalized understanding.

## 2016-03-01 ENCOUNTER — Ambulatory Visit (INDEPENDENT_AMBULATORY_CARE_PROVIDER_SITE_OTHER): Payer: Medicare Other | Admitting: Internal Medicine

## 2016-03-01 ENCOUNTER — Encounter: Payer: Self-pay | Admitting: Internal Medicine

## 2016-03-01 VITALS — BP 132/62 | HR 64 | Ht 67.0 in | Wt 133.0 lb

## 2016-03-01 DIAGNOSIS — N289 Disorder of kidney and ureter, unspecified: Secondary | ICD-10-CM | POA: Diagnosis not present

## 2016-03-01 DIAGNOSIS — E1142 Type 2 diabetes mellitus with diabetic polyneuropathy: Secondary | ICD-10-CM

## 2016-03-01 DIAGNOSIS — L309 Dermatitis, unspecified: Secondary | ICD-10-CM | POA: Insufficient documentation

## 2016-03-01 DIAGNOSIS — I10 Essential (primary) hypertension: Secondary | ICD-10-CM

## 2016-03-01 MED ORDER — GABAPENTIN 300 MG PO CAPS: 300.0000 mg | ORAL_CAPSULE | Freq: Three times a day (TID) | ORAL | 1 refills | Status: DC

## 2016-03-01 MED ORDER — TRIAMCINOLONE ACETONIDE 0.1 % EX CREA: 1.0000 "application " | TOPICAL_CREAM | Freq: Two times a day (BID) | CUTANEOUS | 1 refills | Status: DC

## 2016-03-01 NOTE — Progress Notes (Signed)
Date:  03/01/2016   Name:  Manuel Randolph   DOB:  22-Oct-1945   MRN:  [id number]   Chief Complaint: Follow-up (diab, HTN, chol, GERD, diab neuropathy) and Rash (dry skin- itches, keeping him up at night) Rash  This is a new problem. The current episode started 1 to 4 weeks ago (seen a month ago in UC - dx Impetigo and treated with mupirocin and keflex). The problem has been rapidly improving (no longer purulent or weeping; now dry and thickened) since onset. The affected locations include the left arm and right arm. Pertinent negatives include no cough, fatigue, fever, shortness of breath or sore throat.  Diabetes  He presents for his follow-up diabetic visit. He has type 2 diabetes mellitus. His disease course has been fluctuating. Pertinent negatives for hypoglycemia include no dizziness or nervousness/anxiousness. Associated symptoms include weight loss. Pertinent negatives for diabetes include no chest pain, no fatigue, no polydipsia and no polyuria. Symptoms are stable. His breakfast blood glucose is taken between 6-7 am. His breakfast blood glucose range is generally 90-110 mg/dl. His dinner blood glucose is taken between 5-6 pm. His dinner blood glucose range is generally 140-180 mg/dl.  Hypertension  This is a chronic problem. The current episode started more than 1 year ago. The problem is unchanged. Pertinent negatives include no chest pain, palpitations or shortness of breath. Past treatments include ACE inhibitors and beta blockers.  Gastroesophageal Reflux  He reports no abdominal pain, no chest pain, no coughing, no heartburn, no sore throat, no water brash or no wheezing. This is a recurrent problem. The problem has been gradually worsening. Associated symptoms include weight loss. Pertinent negatives include no fatigue. He has tried a PPI for the symptoms.  Hyperlipidemia  This is a chronic problem. The problem is controlled. Pertinent negatives include no chest pain or  shortness of breath. Current antihyperlipidemic treatment includes statins. The current treatment provides significant improvement of lipids.   Patient states that he has not been consistent with his medications over the past month because he did not have the money.  He is now able to get most of them but needs refill on gabapentin.   Review of Systems  Constitutional: Positive for unexpected weight change and weight loss. Negative for chills, fatigue and fever.  HENT: Negative for sore throat.   Eyes: Negative for visual disturbance.  Respiratory: Negative for cough, chest tightness, shortness of breath and wheezing.   Cardiovascular: Positive for leg swelling (chronic in left ankle). Negative for chest pain and palpitations.  Gastrointestinal: Negative for abdominal pain, blood in stool and heartburn.  Endocrine: Negative for polydipsia and polyuria.  Skin: Positive for rash.  Neurological: Negative for dizziness and light-headedness.  Psychiatric/Behavioral: The patient is not nervous/anxious.     Patient Active Problem List   Diagnosis Date Noted  . Dermatitis 03/01/2016  . Anemia, pernicious 07/04/2015  . Hip pain 07/04/2015  . Cardiac murmur 02/19/2015  . Chronic lymphocytic leukemia ([place]) 02/19/2015  . DM type 2 with diabetic peripheral neuropathy ([place]) 02/19/2015  . Essential (primary) hypertension 02/19/2015  . DM type 2 with diabetic mixed hyperlipidemia ([place]) 02/19/2015  . Diabetic peripheral neuropathy associated with type 2 diabetes mellitus ([place]) 02/19/2015  . Bleeding ulcer 02/19/2015  . Peripheral vascular disease ([place]) 02/19/2015  . 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 Encounter for screening for malignant neoplasm of prostate 02/19/2015  . Impaired renal function 02/19/2015  . Compulsive tobacco user syndrome 02/19/2015  .  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  . Aortic valve defect 12/22/2012  . Atherosclerosis of native  artery of extremity ([place]) 11/13/2011    Prior to Admission medications   Medication Sig Start Date End Date Taking? Authorizing Provider  aspirin 81 MG tablet Take 1 tablet by mouth daily.   Yes Historical Provider, MD  Blood Glucose Monitoring Suppl (ACCU-CHEK AVIVA) device Use as instructed 03/06/15 03/05/16 Yes [name], MD  glucose blood test strip Use as instructed 03/06/15  Yes [name], MD  Lancets 28G MISC 1 each by Does not apply route daily. 03/06/15  Yes [name], MD  lisinopril (PRINIVIL,ZESTRIL) 40 MG tablet Take 1 tablet (40 mg total) by mouth 2 (two) times daily. 10/24/15  Yes [name], MD  metFORMIN (GLUCOPHAGE) 500 MG tablet TAKE 1 TABLET BY MOUTH TWO  TIMES DAILY 01/05/16  Yes [name], MD  metoprolol tartrate (LOPRESSOR) 25 MG tablet Take 1 tablet (25 mg total) by mouth 2 (two) times daily. 10/24/15  Yes [name], MD  mupirocin ointment Drue Stager) 2 % Apply to affected area 3 times daily 01/28/16 01/27/17 Yes Duanne Guess, PA-C  simvastatin (ZOCOR) 20 MG tablet Take 1 tablet (20 mg total) by mouth at bedtime. 10/24/15  Yes [name], MD  gabapentin (NEURONTIN) 300 MG capsule Take 1 capsule (300 mg total) by mouth 2 (two) times daily. Patient not taking: Reported on 03/01/2016 01/24/16   [name], MD  pantoprazole (PROTONIX) 40 MG tablet Take 1 tablet (40 mg total) by mouth daily. Patient not taking: Reported on 03/01/2016 10/24/15   [name], MD  vitamin B-12 (CYANOCOBALAMIN) 100 MCG tablet Take 1 tablet (100 mcg total) by mouth daily. Reported on 07/04/2015 Patient not taking: Reported on 03/01/2016 10/24/15   [name], MD    No Known Allergies  Past Surgical History:  Procedure Laterality Date  . CATARACT EXTRACTION W/PHACO Left 09/13/2014   Procedure: CATARACT EXTRACTION PHACO AND INTRAOCULAR LENS PLACEMENT (IOC);  Surgeon: Birder Robson, MD;  Location: [facility] ORS;  Service: Ophthalmology;   Laterality: Left;  [place] 01:03   . COLONOSCOPY  2014  . POPLITEAL ARTERY ANGIOPLASTY Left 2014  . PTCA Right    leg  . UPPER GASTROINTESTINAL ENDOSCOPY  2014    Social History  Substance Use Topics  . Smoking status: Current Every Day Smoker    Packs/day: 0.50    Types: Cigarettes  . Smokeless tobacco: Never Used  . Alcohol use No     Medication list has been reviewed and updated.   Physical Exam  Constitutional: He is oriented to person, place, and time. He appears well-developed. No distress.  HENT:  Head: Normocephalic and atraumatic.  Cardiovascular: Normal rate and regular rhythm.   Pulmonary/Chest: Effort normal and breath sounds normal. No accessory muscle usage. No respiratory distress.  Musculoskeletal: Normal range of motion. He exhibits edema (trace edema left ankle).  Neurological: He is alert and oriented to person, place, and time.  Skin: Skin is warm and dry. No rash noted.  Dry thickened skin over dorsum right hand and dorsum of left hand and arm. Some thickening of both palms No vesicles, drainage, odor or pain  Psychiatric: His behavior is normal. Thought content normal. His affect is blunt.  Nursing note and vitals reviewed.   BP 132/62   Pulse 64   Ht 5\' 7"  (1.702 m)   Wt 133 lb (60.3 kg)   BMI 20.83  kg/m   Assessment and Plan: 1. Essential (primary) hypertension Controlled - continue current therapy  2. DM type 2 with diabetic peripheral neuropathy (HCC) Stable; increase gabapentin to tid - Hemoglobin A1c - gabapentin (NEURONTIN) 300 MG capsule; Take 1 capsule (300 mg total) by mouth 3 (three) times daily.  Dispense: 270 capsule; Refill: 1  3. Impaired renal function Continue to monitor - Basic metabolic panel  4. Dermatitis If no improvement, will refer to Dermatology - triamcinolone cream (KENALOG) 0.1 %; Apply 1 application topically 2 (two) times daily.  Dispense: 120 g; Refill: [place], MD [facility]  03/01/2016

## 2016-03-02 LAB — BASIC METABOLIC PANEL
BUN / CREAT RATIO: 13 (ref 10–24)
BUN: 22 mg/dL (ref 8–27)
CO2: 22 mmol/L (ref 18–29)
CREATININE: 1.68 mg/dL — AB (ref 0.76–1.27)
Calcium: 9.5 mg/dL (ref 8.6–10.2)
Chloride: 101 mmol/L (ref 96–106)
GFR, EST AFRICAN AMERICAN: 47 mL/min/{1.73_m2} — AB (ref >59)
GFR, EST NON AFRICAN AMERICAN: 41 mL/min/{1.73_m2} — AB (ref >59)
Glucose: 179 mg/dL — ABNORMAL HIGH (ref 65–99)
Potassium: 4.8 mmol/L (ref 3.5–5.2)
Sodium: 140 mmol/L (ref 134–144)

## 2016-03-02 LAB — HEMOGLOBIN A1C
Est. average glucose Bld gHb Est-mCnc: 140 mg/dL
Hgb A1c MFr Bld: 6.5 % — ABNORMAL HIGH (ref 4.8–5.6)

## 2016-03-03 ENCOUNTER — Encounter: Payer: Self-pay | Admitting: Internal Medicine

## 2016-03-04 ENCOUNTER — Inpatient Hospital Stay: Payer: Medicare Other | Admitting: Oncology

## 2016-03-04 ENCOUNTER — Inpatient Hospital Stay: Payer: Medicare Other

## 2016-03-04 DIAGNOSIS — Z952 Presence of prosthetic heart valve: Secondary | ICD-10-CM | POA: Diagnosis not present

## 2016-03-04 DIAGNOSIS — Z79899 Other long term (current) drug therapy: Secondary | ICD-10-CM | POA: Insufficient documentation

## 2016-03-04 DIAGNOSIS — I129 Hypertensive chronic kidney disease with stage 1 through stage 4 chronic kidney disease, or unspecified chronic kidney disease: Secondary | ICD-10-CM | POA: Diagnosis not present

## 2016-03-04 DIAGNOSIS — E1122 Type 2 diabetes mellitus with diabetic chronic kidney disease: Secondary | ICD-10-CM | POA: Insufficient documentation

## 2016-03-04 DIAGNOSIS — J40 Bronchitis, not specified as acute or chronic: Secondary | ICD-10-CM | POA: Diagnosis not present

## 2016-03-04 DIAGNOSIS — R05 Cough: Secondary | ICD-10-CM | POA: Diagnosis not present

## 2016-03-04 DIAGNOSIS — N183 Chronic kidney disease, stage 3 (moderate): Secondary | ICD-10-CM | POA: Diagnosis not present

## 2016-03-04 DIAGNOSIS — F17210 Nicotine dependence, cigarettes, uncomplicated: Secondary | ICD-10-CM | POA: Insufficient documentation

## 2016-03-04 DIAGNOSIS — Z7982 Long term (current) use of aspirin: Secondary | ICD-10-CM | POA: Insufficient documentation

## 2016-03-04 DIAGNOSIS — Z7984 Long term (current) use of oral hypoglycemic drugs: Secondary | ICD-10-CM | POA: Diagnosis not present

## 2016-03-04 NOTE — Progress Notes (Deleted)
[place]  Telephone:([phone]) [phone] Fax:(336) [phone]  ID: Manuel Greenhouse Sr. OB: 06/21/1945  MR#: [id number]  [id number]  Patient Care Team: [name], MD as PCP - General (Family Medicine)  CHIEF COMPLAINT: [name]  INTERVAL HISTORY: ***  REVIEW OF SYSTEMS:   ROS  As per HPI. Otherwise, a complete review of systems is negative.  PAST MEDICAL HISTORY: Past Medical History:  Diagnosis Date  . Anemia   . Diabetes ([place])   . GERD (gastroesophageal reflux disease)   . Hyperlipidemia   . Hypertension     PAST SURGICAL HISTORY: Past Surgical History:  Procedure Laterality Date  . CATARACT EXTRACTION W/PHACO Left 09/13/2014   Procedure: CATARACT EXTRACTION PHACO AND INTRAOCULAR LENS PLACEMENT (IOC);  Surgeon: Birder Robson, MD;  Location: [facility] ORS;  Service: Ophthalmology;  Laterality: Left;  [place] 01:03   . COLONOSCOPY  2014  . POPLITEAL ARTERY ANGIOPLASTY Left 2014  . PTCA Right    leg  . UPPER GASTROINTESTINAL ENDOSCOPY  2014    FAMILY HISTORY: Family History  Problem Relation Age of Onset  . Diabetes Mother   . Diabetes Father     ADVANCED DIRECTIVES (Y/N):  N  HEALTH MAINTENANCE: Social History  Substance Use Topics  . Smoking status: Current Every Day Smoker    Packs/day: 0.50    Types: Cigarettes  . Smokeless tobacco: Never Used  . Alcohol use No     Colonoscopy:  PAP:  Bone density:  Lipid panel:  No Known Allergies  Current Outpatient Prescriptions  Medication Sig Dispense Refill  . aspirin 81 MG tablet Take 1 tablet by mouth daily.    . Blood Glucose Monitoring Suppl (ACCU-CHEK AVIVA) device Use as instructed 1 each 0  . gabapentin (NEURONTIN) 300 MG capsule Take 1 capsule (300 mg total) by mouth 3 (three) times daily. 270 capsule 1  . glucose blood test strip Use as instructed 100 each 12  . Lancets 28G MISC 1 each by Does not apply route daily. 100 each 3  . lisinopril (PRINIVIL,ZESTRIL) 40 MG tablet Take 1  tablet (40 mg total) by mouth 2 (two) times daily. 180 tablet 3  . metFORMIN (GLUCOPHAGE) 500 MG tablet TAKE 1 TABLET BY MOUTH TWO  TIMES DAILY 180 tablet 1  . metoprolol tartrate (LOPRESSOR) 25 MG tablet Take 1 tablet (25 mg total) by mouth 2 (two) times daily. 180 tablet 3  . mupirocin ointment (BACTROBAN) 2 % Apply to affected area 3 times daily 22 g 0  . pantoprazole (PROTONIX) 40 MG tablet Take 1 tablet (40 mg total) by mouth daily. (Patient not taking: Reported on 03/01/2016) 90 tablet 3  . simvastatin (ZOCOR) 20 MG tablet Take 1 tablet (20 mg total) by mouth at bedtime. 90 tablet 3  . triamcinolone cream (KENALOG) 0.1 % Apply 1 application topically 2 (two) times daily. 120 g 1  . vitamin B-12 (CYANOCOBALAMIN) 100 MCG tablet Take 1 tablet (100 mcg total) by mouth daily. Reported on 07/04/2015 (Patient not taking: Reported on 03/01/2016) 90 tablet 3   No current facility-administered medications for this visit.     OBJECTIVE: There were no vitals filed for this visit.   There is no height or weight on file to calculate BMI.    ECOG FS:{CHL ONC [id number]  General: Well-developed, well-nourished, no acute distress. Eyes: Pink conjunctiva, anicteric sclera. HEENT: Normocephalic, moist mucous membranes, clear oropharnyx. Lungs: Clear to auscultation bilaterally. Heart: Regular rate and rhythm. No rubs, murmurs, or gallops. Abdomen: Soft,  nontender, nondistended. No organomegaly noted, normoactive bowel sounds. Musculoskeletal: No edema, cyanosis, or clubbing. Neuro: Alert, answering all questions appropriately. Cranial nerves grossly intact. Skin: No rashes or petechiae noted. Psych: Normal affect. Lymphatics: No cervical, calvicular, axillary or inguinal LAD.   LAB RESULTS:  Lab Results  Component Value Date   NA 140 03/01/2016   K 4.8 03/01/2016   CL 101 03/01/2016   CO2 22 03/01/2016   GLUCOSE 179 (H) 03/01/2016   BUN 22 03/01/2016   CREATININE 1.68 (H) 03/01/2016    CALCIUM 9.5 03/01/2016   PROT 5.8 (L) 04/17/2013   ALBUMIN 2.9 (L) 04/17/2013   AST 35 04/17/2013   ALT 17 04/17/2013   ALKPHOS 62 04/17/2013   BILITOT 0.2 04/17/2013   GFRNONAA 41 (L) 03/01/2016   GFRAA 47 (L) 03/01/2016    Lab Results  Component Value Date   WBC 15.4 (H) 09/01/2015   NEUTROABS 5.2 09/01/2015   HGB 12.7 (L) 09/01/2015   HCT 38.3 (L) 09/01/2015   MCV 92.0 09/01/2015   PLT 149 (L) 09/01/2015     STUDIES: No results found.  ASSESSMENT: [name]  PLAN:    1. [name]:  Patient expressed understanding and was in agreement with this plan. He also understands that He can call clinic at any time with any questions, concerns, or complaints.   No matching staging information was found for the patient.  Manuel Huger, MD   03/04/2016 12:20 PM

## 2016-03-05 ENCOUNTER — Emergency Department
Admission: EM | Admit: 2016-03-05 | Discharge: 2016-03-05 | Disposition: A | Discharge status: Home / Self Care | Payer: Medicare Other | Attending: Emergency Medicine | Admitting: Emergency Medicine

## 2016-03-05 ENCOUNTER — Encounter: Payer: Self-pay | Admitting: Emergency Medicine

## 2016-03-05 ENCOUNTER — Emergency Department: Payer: Medicare Other

## 2016-03-05 DIAGNOSIS — J40 Bronchitis, not specified as acute or chronic: Secondary | ICD-10-CM

## 2016-03-05 DIAGNOSIS — R059 Cough, unspecified: Secondary | ICD-10-CM

## 2016-03-05 DIAGNOSIS — R05 Cough: Secondary | ICD-10-CM | POA: Diagnosis not present

## 2016-03-05 LAB — COMPREHENSIVE METABOLIC PANEL
ALT: 7 U/L — AB (ref 17–63)
AST: 16 U/L (ref 15–41)
Albumin: 3.8 g/dL (ref 3.5–5.0)
Alkaline Phosphatase: 81 U/L (ref 38–126)
Anion gap: 7 (ref 5–15)
BUN: 28 mg/dL — AB (ref 6–20)
CHLORIDE: 108 mmol/L (ref 101–111)
CO2: 25 mmol/L (ref 22–32)
CREATININE: 1.48 mg/dL — AB (ref 0.61–1.24)
Calcium: 9.3 mg/dL (ref 8.9–10.3)
GFR calc Af Amer: 54 mL/min — ABNORMAL LOW (ref >60)
GFR, EST NON AFRICAN AMERICAN: 47 mL/min — AB (ref >60)
GLUCOSE: 117 mg/dL — AB (ref 65–99)
Potassium: 4 mmol/L (ref 3.5–5.1)
Sodium: 140 mmol/L (ref 135–145)
Total Bilirubin: 0.7 mg/dL (ref 0.3–1.2)
Total Protein: 7.5 g/dL (ref 6.5–8.1)

## 2016-03-05 LAB — CBC
HEMATOCRIT: 38.5 % — AB (ref 40.0–52.0)
Hemoglobin: 12.9 g/dL — ABNORMAL LOW (ref 13.0–18.0)
MCH: 30.6 pg (ref 26.0–34.0)
MCHC: 33.4 g/dL (ref 32.0–36.0)
MCV: 91.6 fL (ref 80.0–100.0)
PLATELETS: 112 10*3/uL — AB (ref 150–440)
RBC: 4.2 MIL/uL — AB (ref 4.40–5.90)
RDW: 14.7 % — ABNORMAL HIGH (ref 11.5–14.5)
WBC: 10.6 10*3/uL (ref 3.8–10.6)

## 2016-03-05 LAB — TROPONIN I

## 2016-03-05 MED ORDER — AZITHROMYCIN 250 MG PO TABS: ORAL_TABLET | ORAL | 0 refills | Status: AC

## 2016-03-05 MED ORDER — PREDNISONE 20 MG PO TABS
60.0000 mg | ORAL_TABLET | Freq: Once | ORAL | Status: AC
  Administered 2016-03-05: 60 mg via ORAL
  Filled 2016-03-05: qty 3

## 2016-03-05 MED ORDER — HYDROCOD POLST-CPM POLST ER 10-8 MG/5ML PO SUER: 5.0000 mL | Freq: Two times a day (BID) | ORAL | 0 refills | Status: DC

## 2016-03-05 MED ORDER — ONDANSETRON 4 MG PO TBDP: 4.0000 mg | ORAL_TABLET | Freq: Three times a day (TID) | ORAL | 0 refills | Status: DC | PRN

## 2016-03-05 MED ORDER — IPRATROPIUM-ALBUTEROL 0.5-2.5 (3) MG/3ML IN SOLN
3.0000 mL | Freq: Once | RESPIRATORY_TRACT | Status: AC
  Administered 2016-03-05: 3 mL via RESPIRATORY_TRACT
  Filled 2016-03-05: qty 3

## 2016-03-05 MED ORDER — GI COCKTAIL ~~LOC~~
30.0000 mL | Freq: Once | ORAL | Status: AC
  Administered 2016-03-05: 30 mL via ORAL
  Filled 2016-03-05: qty 30

## 2016-03-05 MED ORDER — HYDROCOD POLST-CPM POLST ER 10-8 MG/5ML PO SUER
5.0000 mL | Freq: Once | ORAL | Status: AC
  Administered 2016-03-05: 5 mL via ORAL
  Filled 2016-03-05: qty 5

## 2016-03-05 MED ORDER — ALBUTEROL SULFATE HFA 108 (90 BASE) MCG/ACT IN AERS: 2.0000 | INHALATION_SPRAY | Freq: Four times a day (QID) | RESPIRATORY_TRACT | 0 refills | Status: DC | PRN

## 2016-03-05 NOTE — ED Notes (Signed)
Pt awakens easily for med admin; denies c/o

## 2016-03-05 NOTE — ED Notes (Signed)
Pt resting quietly with eyes closed, resp even/unlab with no distress noted

## 2016-03-05 NOTE — ED Provider Notes (Signed)
[facility] Emergency Department Provider Note   ____________________________________________   First MD Initiated Contact with Patient 03/05/16 0158     (approximate)  I have reviewed the triage vital signs and the nursing notes.   HISTORY  Chief Complaint Cough    HPI Manuel SIRBAUGH Sr. is a 70 y.o. male who comes into the hospital today with a cough. The patient reports that he started coughing tonight and vomited. He reports he vomited somewhere between 5 and 8 times. He reports that every time he vomited it was because he was coughing beforehand. The patient reports that the cough seemed to start today. He does not bring anything up with the cough aside from vomiting. The patient reports that he was also having difficulty sleeping because he was messing with his arm. He reports that he did get some prescriptions for his arm but is not helping. He has a rash in his left arm for the last month. He was given some antibiotics and some medication he reports that dried up. The patient also saw his primary care physician and was given something else to help dried up but it has not helped. He reports that he was told to follow-up with his doctor on April 1. The patient denies any chest pain or shortness of breath. He was just concerned because he was coughing and was vomiting. The patient is here today for evaluation.   Past Medical History:  Diagnosis Date  . Anemia   . Diabetes ([place])   . GERD (gastroesophageal reflux disease)   . Hyperlipidemia   . Hypertension     Patient Active Problem List   Diagnosis Date Noted  . Dermatitis 03/01/2016  . Anemia, pernicious 07/04/2015  . Hip pain 07/04/2015  . Cardiac murmur 02/19/2015  . Chronic lymphocytic leukemia ([place]) 02/19/2015  . DM type 2 with diabetic peripheral neuropathy ([place]) 02/19/2015  . Essential (primary) hypertension 02/19/2015  . DM type 2 with diabetic mixed hyperlipidemia ([place]) 02/19/2015  .  Diabetic peripheral neuropathy associated with type 2 diabetes mellitus ([place]) 02/19/2015  . Bleeding ulcer 02/19/2015  . Peripheral vascular disease ([place]) 02/19/2015  . 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 Encounter for screening for malignant neoplasm of prostate 02/19/2015  . Type 2 diabetes mellitus with stage 3 chronic kidney disease ([place]) 02/19/2015  . Compulsive tobacco user syndrome 02/19/2015  . 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  . Aortic valve defect 12/22/2012  . 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[place]) 11/13/2011    Past Surgical History:  Procedure Laterality Date  . CATARACT EXTRACTION W/PHACO Left 09/13/2014   Procedure: CATARACT EXTRACTION PHACO AND INTRAOCULAR LENS PLACEMENT (IOC);  Surgeon: Birder Robson, MD;  Location: [facility] ORS;  Service: Ophthalmology;  Laterality: Left;  [place] 01:03   . COLONOSCOPY  2014  . POPLITEAL ARTERY ANGIOPLASTY Left 2014  . PTCA Right    leg  . UPPER GASTROINTESTINAL ENDOSCOPY  2014    Prior to Admission medications   Medication Sig Start Date End Date Taking? Authorizing Provider  albuterol (PROVENTIL HFA;VENTOLIN HFA) 108 (90 Base) MCG/ACT inhaler Inhale 2 puffs into the lungs every 6 (six) hours as needed for wheezing or shortness of breath. 03/05/16   Loney Hering, MD  aspirin 81 MG tablet Take 1 tablet by mouth daily.    Historical Provider, MD  azithromycin (ZITHROMAX Z-PAK) 250 MG tablet Take 2 tablets (500 mg) on  Day 1,  followed by 1 tablet (250 mg)  once daily on Days 2 through 5. 03/05/16 03/10/16  Loney Hering, MD  Blood Glucose Monitoring Suppl (ACCU-CHEK AVIVA) device Use as instructed 03/06/15 03/05/16  [name], MD  chlorpheniramine-HYDROcodone [facility] PENNKINETIC ER) 10-8 MG/5ML SUER Take 5 mLs by mouth 2 (two) times daily. 03/05/16   Loney Hering, MD  gabapentin (NEURONTIN) 300 MG capsule Take 1 capsule (300 mg total) by mouth 3 (three) times daily.  03/01/16   [name], MD  glucose blood test strip Use as instructed 03/06/15   [name], MD  Lancets 28G MISC 1 each by Does not apply route daily. 03/06/15   [name], MD  lisinopril (PRINIVIL,ZESTRIL) 40 MG tablet Take 1 tablet (40 mg total) by mouth 2 (two) times daily. 10/24/15   [name], MD  metFORMIN (GLUCOPHAGE) 500 MG tablet TAKE 1 TABLET BY MOUTH TWO  TIMES DAILY 01/05/16   [name], MD  metoprolol tartrate (LOPRESSOR) 25 MG tablet Take 1 tablet (25 mg total) by mouth 2 (two) times daily. 10/24/15   [name], MD  mupirocin ointment Drue Stager) 2 % Apply to affected area 3 times daily 01/28/16 01/27/17  Duanne Guess, PA-C  ondansetron (ZOFRAN ODT) 4 MG disintegrating tablet Take 1 tablet (4 mg total) by mouth every 8 (eight) hours as needed for nausea or vomiting. 03/05/16   Loney Hering, MD  pantoprazole (PROTONIX) 40 MG tablet Take 1 tablet (40 mg total) by mouth daily. Patient not taking: Reported on 03/01/2016 10/24/15   [name], MD  simvastatin (ZOCOR) 20 MG tablet Take 1 tablet (20 mg total) by mouth at bedtime. 10/24/15   [name], MD  triamcinolone cream (KENALOG) 0.1 % Apply 1 application topically 2 (two) times daily. 03/01/16   [name], MD  vitamin B-12 (CYANOCOBALAMIN) 100 MCG tablet Take 1 tablet (100 mcg total) by mouth daily. Reported on 07/04/2015 Patient not taking: Reported on 03/01/2016 10/24/15   [name], MD    Allergies Patient has no known allergies.  Family History  Problem Relation Age of Onset  . Diabetes Mother   . Diabetes Father     Social History Social History  Substance Use Topics  . Smoking status: Current Every Day Smoker    Packs/day: 0.50    Types: Cigarettes  . Smokeless tobacco: Never Used  . Alcohol use No    Review of Systems Constitutional: No fever/chills Eyes: No visual changes. ENT: No sore throat. Cardiovascular: Denies chest pain. Respiratory:  Cough Gastrointestinal: Vomiting with No abdominal pain.  No nausea, No diarrhea.  No constipation. Genitourinary: Negative for dysuria. Musculoskeletal: Negative for back pain. Skin: Negative for rash. Neurological: Negative for headaches, focal weakness or numbness.  10-point ROS otherwise negative.  ____________________________________________   PHYSICAL EXAM:  VITAL SIGNS: ED Triage Vitals  Enc Vitals Group     BP 03/05/16 0002 (!) 190/69     Pulse Rate 03/05/16 0002 76     Resp 03/05/16 0250 18     Temp 03/05/16 0002 99.1 F (37.3 C)     Temp Source 03/05/16 0002 Oral     SpO2 03/05/16 0002 100 %     Weight 03/05/16 0016 133 lb (60.3 kg)     Height 03/05/16 0016 5\' 7"  (1.702 m)     Head Circumference --      Peak Flow --      Pain Score --      Pain  Loc --      Pain Edu? --      Excl. in [place]? --     Constitutional: Alert and oriented. Well appearing and in Mild distress. Eyes: Conjunctivae are normal. PERRL. EOMI. Head: Atraumatic. Nose: No congestion/rhinnorhea. Mouth/Throat: Mucous membranes are moist.  Oropharynx non-erythematous. Cardiovascular: Normal rate, irregular rhythm. Systolic murmur  Good peripheral circulation. Respiratory: Normal respiratory effort.  No retractions. Lungs CTAB. Gastrointestinal: Soft and nontender. No distention. Positive bowel sounds Musculoskeletal: No lower extremity tenderness nor edema.   Neurologic:  Normal speech and language.  Skin:  Skin is warm, dry and intact. [place] Psychiatric: Mood and affect are normal.   ____________________________________________   LABS (all labs ordered are listed, but only abnormal results are displayed)  Labs Reviewed  CBC - Abnormal; Notable for the following:       Result Value   RBC 4.20 (*)    Hemoglobin 12.9 (*)    HCT 38.5 (*)    RDW 14.7 (*)    Platelets 112 (*)    All other components within normal limits  COMPREHENSIVE METABOLIC PANEL - Abnormal; Notable for the following:     Glucose, Bld 117 (*)    BUN 28 (*)    Creatinine, Ser 1.48 (*)    ALT 7 (*)    GFR calc non Af Amer 47 (*)    GFR calc Af Amer 54 (*)    All other components within normal limits  TROPONIN I   ____________________________________________  EKG  ED ECG REPORT I, Loney Hering, the attending physician, personally viewed and interpreted this ECG.   Date: 03/05/2016  EKG Time: 257  Rate: 65  Rhythm: normal sinus rhythm  Axis: normal  Intervals:bigeminy  ST&T Change: none  ____________________________________________  RADIOLOGY  CXR ____________________________________________   PROCEDURES  Procedure(s) performed: None  Procedures  Critical Care performed: No  ____________________________________________   INITIAL IMPRESSION / ASSESSMENT AND PLAN / ED COURSE  Pertinent labs & imaging results that were available during my care of the patient were reviewed by me and considered in my medical decision making (see chart for details).  This is a 70 year old man who comes into the hospital today with some posttussive emesis. The patient has been having a cough. He did receive a chest x-ray to determine if he had pneumonia. The patient initially to me denied that he had some chest pain but then he did tell the nurse that some of his cough comes up in his chest especially at night when he lays down. I will send some basic blood work just to evaluate any possible other cause of the patient's symptoms. He will receive a dose of prednisone, a DuoNeb and Tussionex. I will reassess the patient.  Clinical Course as of Mar 06 719  Tue Mar 05, 2016  0158 Chronic interstitial coarsening and stable linear scarring. No consolidation or effusion.   DG Chest 2 View [AW]    Clinical Course User Index [AW] Loney Hering, MD    Patient has been sleeping well without any difficulty. He does have occasional coughing fits he hasn't had any amount of copious emesis in the  emergency department. I will treat the patient for bronchitis and cough. He will receive some cough medicine and azithromycin. I will also give the patient inhaler. The patient should follow-up with his primary care physician for further evaluation. His blood work is unremarkable. ____________________________________________   FINAL CLINICAL IMPRESSION(S) / ED DIAGNOSES  Final diagnoses:  Cough  Bronchitis      NEW MEDICATIONS STARTED DURING THIS VISIT:  New Prescriptions   ALBUTEROL (PROVENTIL HFA;VENTOLIN HFA) 108 (90 BASE) MCG/ACT INHALER    Inhale 2 puffs into the lungs every 6 (six) hours as needed for wheezing or shortness of breath.   AZITHROMYCIN (ZITHROMAX Z-PAK) 250 MG TABLET    Take 2 tablets (500 mg) on  Day 1,  followed by 1 tablet (250 mg) once daily on Days 2 through 5.   CHLORPHENIRAMINE-HYDROCODONE (TUSSIONEX PENNKINETIC ER) 10-8 MG/5ML SUER    Take 5 mLs by mouth 2 (two) times daily.   ONDANSETRON (ZOFRAN ODT) 4 MG DISINTEGRATING TABLET    Take 1 tablet (4 mg total) by mouth every 8 (eight) hours as needed for nausea or vomiting.     Note:  This document was prepared using Dragon voice recognition software and may include unintentional dictation errors.    Loney Hering, MD 03/05/16 [phone]

## 2016-03-05 NOTE — ED Notes (Signed)
Noted irregular HR with palpation of radial pulse; pt placed on card monitor and EKG performed; MD notified

## 2016-03-05 NOTE — ED Notes (Addendum)
Pt uprite on stretcher in exam room with no distress noted; pt reports having a cough tonight, st "unsure what he's coughing up but it always starts with my heartburn"; st +smoker with hx of same; resp even/unlab,lungs clear; denies any pain

## 2016-03-05 NOTE — ED Notes (Signed)
Pt resting quietly with eyes closed; resp even/unlab, with no distress noted

## 2016-03-05 NOTE — ED Triage Notes (Addendum)
Pt presents to ED via [facility] from home with c/o "coughing so much I threw up." after smoking cigarette. Pt reports coughing began at 7-8 pm last night and reports coughing accompanied by vomiting occurred 5 times since then. Pt reports also wants to have left arm checked due to itching, no rash or whelps noted to left arm. Pt reports was seen for arm itching and told to put peroxide on arm. Dry skin noted to left arm. Pt alert and oriented x 4, no increased work in breathing noted.

## 2016-03-15 ENCOUNTER — Other Ambulatory Visit: Payer: Self-pay | Admitting: Internal Medicine

## 2016-03-17 ENCOUNTER — Emergency Department: Payer: Medicare Other

## 2016-03-17 DIAGNOSIS — Z79899 Other long term (current) drug therapy: Secondary | ICD-10-CM | POA: Insufficient documentation

## 2016-03-17 DIAGNOSIS — R21 Rash and other nonspecific skin eruption: Secondary | ICD-10-CM | POA: Insufficient documentation

## 2016-03-17 DIAGNOSIS — R918 Other nonspecific abnormal finding of lung field: Secondary | ICD-10-CM | POA: Diagnosis not present

## 2016-03-17 DIAGNOSIS — I10 Essential (primary) hypertension: Secondary | ICD-10-CM | POA: Insufficient documentation

## 2016-03-17 DIAGNOSIS — I2510 Atherosclerotic heart disease of native coronary artery without angina pectoris: Secondary | ICD-10-CM | POA: Diagnosis not present

## 2016-03-17 DIAGNOSIS — Z7982 Long term (current) use of aspirin: Secondary | ICD-10-CM | POA: Diagnosis not present

## 2016-03-17 DIAGNOSIS — F17210 Nicotine dependence, cigarettes, uncomplicated: Secondary | ICD-10-CM | POA: Diagnosis not present

## 2016-03-17 DIAGNOSIS — E1169 Type 2 diabetes mellitus with other specified complication: Secondary | ICD-10-CM | POA: Diagnosis not present

## 2016-03-17 NOTE — ED Triage Notes (Signed)
Pt states he has itching to L arm, upon evaluation area is warm to touch.  Pt's blood pressure elevated in triage.  Dr. Quentin Cornwall ordered EKG and CXR on patient at this time.  Pt also states he has been having some bilateral swelling in feet and itching in feet as well.

## 2016-03-18 ENCOUNTER — Emergency Department
Admission: EM | Admit: 2016-03-18 | Discharge: 2016-03-18 | Disposition: A | Discharge status: Home / Self Care | Payer: Medicare Other | Attending: Emergency Medicine | Admitting: Emergency Medicine

## 2016-03-18 DIAGNOSIS — R918 Other nonspecific abnormal finding of lung field: Secondary | ICD-10-CM | POA: Diagnosis not present

## 2016-03-18 DIAGNOSIS — R21 Rash and other nonspecific skin eruption: Secondary | ICD-10-CM

## 2016-03-18 DIAGNOSIS — I10 Essential (primary) hypertension: Secondary | ICD-10-CM

## 2016-03-18 LAB — COMPREHENSIVE METABOLIC PANEL
ALBUMIN: 3.5 g/dL (ref 3.5–5.0)
ALT: 9 U/L — AB (ref 17–63)
AST: 15 U/L (ref 15–41)
Alkaline Phosphatase: 82 U/L (ref 38–126)
Anion gap: 7 (ref 5–15)
BUN: 27 mg/dL — ABNORMAL HIGH (ref 6–20)
CHLORIDE: 105 mmol/L (ref 101–111)
CO2: 25 mmol/L (ref 22–32)
CREATININE: 1.48 mg/dL — AB (ref 0.61–1.24)
Calcium: 9.5 mg/dL (ref 8.9–10.3)
GFR calc non Af Amer: 47 mL/min — ABNORMAL LOW (ref >60)
GFR, EST AFRICAN AMERICAN: 54 mL/min — AB (ref >60)
GLUCOSE: 153 mg/dL — AB (ref 65–99)
Potassium: 4.2 mmol/L (ref 3.5–5.1)
SODIUM: 137 mmol/L (ref 135–145)
Total Bilirubin: 0.3 mg/dL (ref 0.3–1.2)
Total Protein: 7.6 g/dL (ref 6.5–8.1)

## 2016-03-18 LAB — CBC WITH DIFFERENTIAL/PLATELET
Basophils Absolute: 0.1 10*3/uL (ref 0–0.1)
Basophils Relative: 1 %
Eosinophils Absolute: 0.4 10*3/uL (ref 0–0.7)
Eosinophils Relative: 5 %
HCT: 36.3 % — ABNORMAL LOW (ref 40.0–52.0)
Hemoglobin: 12.3 g/dL — ABNORMAL LOW (ref 13.0–18.0)
Lymphocytes Relative: 51 %
Lymphs Abs: 4 10*3/uL — ABNORMAL HIGH (ref 1.0–3.6)
MCH: 30.6 pg (ref 26.0–34.0)
MCHC: 33.8 g/dL (ref 32.0–36.0)
MCV: 90.5 fL (ref 80.0–100.0)
Monocytes Absolute: 0.6 10*3/uL (ref 0.2–1.0)
Monocytes Relative: 7 %
Neutro Abs: 2.8 10*3/uL (ref 1.4–6.5)
Neutrophils Relative %: 36 %
Platelets: 178 10*3/uL (ref 150–440)
RBC: 4.01 MIL/uL — ABNORMAL LOW (ref 4.40–5.90)
RDW: 14.8 % — ABNORMAL HIGH (ref 11.5–14.5)
WBC: 7.9 10*3/uL (ref 3.8–10.6)

## 2016-03-18 LAB — TROPONIN I: Troponin I: <0.03 ng/mL (ref <0.03)

## 2016-03-18 MED ORDER — METFORMIN HCL 500 MG PO TABS: 500.0000 mg | ORAL_TABLET | Freq: Two times a day (BID) | ORAL | 0 refills | Status: DC

## 2016-03-18 MED ORDER — METOPROLOL TARTRATE 25 MG PO TABS: 25.0000 mg | ORAL_TABLET | Freq: Two times a day (BID) | ORAL | 0 refills | Status: DC

## 2016-03-18 MED ORDER — HYDROCORTISONE 1 % EX LOTN: 1.0000 "application " | TOPICAL_LOTION | Freq: Two times a day (BID) | CUTANEOUS | 0 refills | Status: DC

## 2016-03-18 NOTE — ED Notes (Signed)
Pt is out of meds for his BP and for his skin condition. Pt states he had meds for his itching skin, a cream and pills for itching, which worked to help reduce the scaling and itching. Pt is out of those meds, too.

## 2016-03-18 NOTE — ED Provider Notes (Signed)
[facility] Emergency Department Provider Note  ____________________________________________   First MD Initiated Contact with Patient 03/18/16 [phone]     (approximate)  I have reviewed the triage vital signs and the nursing notes.   HISTORY  Chief Complaint Arm Pain (itching and warmth to L arm)   HPI Manuel FAHEY Sr. is a 70 y.o. male with a history of diabetes, anemia and hypertension is presenting to the emergency department with a rash to his bilateral upper as well as lower extremities. He says that this all started about 1 month ago and he was prescribed Keflex as well as an antibiotic cream which resolved the symptoms. However, over the past several weeks his symptoms and worsening with worsening scaling of the bilateral upper as well as lower extremities. He denies fever. He does report swelling to the bilateral ankles. He says that he has been off of his chronic medications for about the past month.   Past Medical History:  Diagnosis Date  . Anemia   . Diabetes ([place])   . GERD (gastroesophageal reflux disease)   . Hyperlipidemia   . Hypertension     Patient Active Problem List   Diagnosis Date Noted  . Dermatitis 03/01/2016  . Anemia, pernicious 07/04/2015  . Hip pain 07/04/2015  . Cardiac murmur 02/19/2015  . Chronic lymphocytic leukemia ([place]) 02/19/2015  . DM type 2 with diabetic peripheral neuropathy ([place]) 02/19/2015  . Essential (primary) hypertension 02/19/2015  . DM type 2 with diabetic mixed hyperlipidemia ([place]) 02/19/2015  . Diabetic peripheral neuropathy associated with type 2 diabetes mellitus ([place]) 02/19/2015  . Bleeding ulcer 02/19/2015  . Peripheral vascular disease ([place]) 02/19/2015  . 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 Encounter for screening for malignant neoplasm of prostate 02/19/2015  . Type 2 diabetes mellitus with stage 3 chronic kidney disease ([place]) 02/19/2015  . Compulsive tobacco user  syndrome 02/19/2015  . 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  . Aortic valve defect 12/22/2012  . 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[place]) 11/13/2011    Past Surgical History:  Procedure Laterality Date  . CATARACT EXTRACTION W/PHACO Left 09/13/2014   Procedure: CATARACT EXTRACTION PHACO AND INTRAOCULAR LENS PLACEMENT (IOC);  Surgeon: Birder Robson, MD;  Location: [facility] ORS;  Service: Ophthalmology;  Laterality: Left;  [place] 01:03   . COLONOSCOPY  2014  . POPLITEAL ARTERY ANGIOPLASTY Left 2014  . PTCA Right    leg  . UPPER GASTROINTESTINAL ENDOSCOPY  2014    Prior to Admission medications   Medication Sig Start Date End Date Taking? Authorizing Provider  albuterol (PROVENTIL HFA;VENTOLIN HFA) 108 (90 Base) MCG/ACT inhaler Inhale 2 puffs into the lungs every 6 (six) hours as needed for wheezing or shortness of breath. 03/05/16   Loney Hering, MD  aspirin 81 MG tablet Take 1 tablet by mouth daily.    Historical Provider, MD  chlorpheniramine-HYDROcodone (TUSSIONEX PENNKINETIC ER) 10-8 MG/5ML SUER Take 5 mLs by mouth 2 (two) times daily. 03/05/16   Loney Hering, MD  gabapentin (NEURONTIN) 300 MG capsule Take 1 capsule (300 mg total) by mouth 3 (three) times daily. 03/01/16   [name], MD  glucose blood test strip Use as instructed 03/06/15   [name], MD  Lancets 28G MISC 1 each by Does not apply route daily. 03/06/15   [name], MD  lisinopril (PRINIVIL,ZESTRIL) 40 MG tablet Take 1 tablet (40 mg total) by mouth 2 (two) times daily. 10/24/15  [name], MD  metFORMIN (GLUCOPHAGE) 500 MG tablet TAKE 1 TABLET BY MOUTH TWO  TIMES DAILY 01/05/16   [name], MD  metoprolol tartrate (LOPRESSOR) 25 MG tablet Take 1 tablet (25 mg total) by mouth 2 (two) times daily. 10/24/15   [name], MD  mupirocin ointment Drue Stager) 2 % Apply to affected area 3 times daily 01/28/16 01/27/17  Duanne Guess, PA-C  ondansetron  (ZOFRAN ODT) 4 MG disintegrating tablet Take 1 tablet (4 mg total) by mouth every 8 (eight) hours as needed for nausea or vomiting. 03/05/16   Loney Hering, MD  pantoprazole (PROTONIX) 40 MG tablet Take 1 tablet (40 mg total) by mouth daily. Patient not taking: Reported on 03/01/2016 10/24/15   [name], MD  simvastatin (ZOCOR) 20 MG tablet Take 1 tablet (20 mg total) by mouth at bedtime. 10/24/15   [name], MD  triamcinolone cream (KENALOG) 0.1 % Apply 1 application topically 2 (two) times daily. 03/01/16   [name], MD  vitamin B-12 (CYANOCOBALAMIN) 100 MCG tablet Take 1 tablet (100 mcg total) by mouth daily. Reported on 07/04/2015 Patient not taking: Reported on 03/01/2016 10/24/15   [name], MD    Allergies Patient has no known allergies.  Family History  Problem Relation Age of Onset  . Diabetes Mother   . Diabetes Father     Social History Social History  Substance Use Topics  . Smoking status: Current Every Day Smoker    Packs/day: 0.50    Types: Cigarettes  . Smokeless tobacco: Never Used  . Alcohol use No    Review of Systems Constitutional: No fever/chills Eyes: No visual changes. ENT: No sore throat. Cardiovascular: Denies chest pain. Respiratory: Denies shortness of breath. Gastrointestinal: No abdominal pain.  No nausea, no vomiting.  No diarrhea.  No constipation. Genitourinary: Negative for dysuria. Musculoskeletal: Negative for back pain. Skin: As above Neurological: Negative for headaches, focal weakness or numbness.  10-point ROS otherwise negative.  ____________________________________________   PHYSICAL EXAM:  VITAL SIGNS: ED Triage Vitals  Enc Vitals Group     BP 03/17/16 2314 (!) 203/83     Pulse Rate 03/17/16 2314 75     Resp 03/17/16 2314 16     Temp 03/17/16 2314 98.3 F (36.8 C)     Temp Source 03/17/16 2314 Oral     SpO2 03/17/16 2314 99 %     Weight 03/17/16 2315 133 lb (60.3 kg)     Height 03/17/16  2315 5\' 7"  (1.702 m)     Head Circumference --      Peak Flow --      Pain Score 03/18/16 0202 8     Pain Loc --      Pain Edu? --      Excl. in [place]? --     Constitutional: Alert and oriented. Well appearing and in no acute distress. Eyes: Conjunctivae are normal. PERRL. EOMI. Head: Atraumatic. Nose: No congestion/rhinnorhea. Mouth/Throat: Mucous membranes are moist.   Neck: No stridor.   Cardiovascular: Normal rate, regular rhythm. Grossly normal heart sounds. Respiratory: Normal respiratory effort.  No retractions. Lungs CTAB. Gastrointestinal: Soft and nontender. No distention. No CVA tenderness. Musculoskeletal: No lower extremity tenderness nor edema.  No joint effusions. Neurologic:  Normal speech and language. No gross focal neurologic deficits are appreciated. No gait instability. Skin: Cracked and thickened skin to the bilateral forearms as well as calves. No induration. No pus or heat. No bullae  or vesicles. Psychiatric: Mood and affect are normal. Speech and behavior are normal.  ____________________________________________   LABS (all labs ordered are listed, but only abnormal results are displayed)  Labs Reviewed  COMPREHENSIVE METABOLIC PANEL - Abnormal; Notable for the following:       Result Value   Glucose, Bld 153 (*)    BUN 27 (*)    Creatinine, Ser 1.48 (*)    ALT 9 (*)    GFR calc non Af Amer 47 (*)    GFR calc Af Amer 54 (*)    All other components within normal limits  CBC WITH DIFFERENTIAL/PLATELET - Abnormal; Notable for the following:    RBC 4.01 (*)    Hemoglobin 12.3 (*)    HCT 36.3 (*)    RDW 14.8 (*)    Lymphs Abs 4.0 (*)    All other components within normal limits  TROPONIN I   ____________________________________________  EKG  ED ECG REPORT I, Doran Stabler, the attending physician, personally viewed and interpreted this ECG.   Date: 03/18/2016  EKG Time: 2324  Rate: 69  Rhythm: normal sinus rhythm  Axis: Normal   Intervals:Incomplete right bundle branch block.  ST&T Change: ST elevation in V2 and V3 which is concave morphology. Similar morphology to previous EKGs on the record. No reciprocal depressions. No abnormal ST-T inversions.  ____________________________________________  [id number]   DG Chest 2 View (Final result)  Result time 03/18/16 01:08:31  Final result by Bella Kennedy, MD (03/18/16 01:08:31)           Narrative:   CLINICAL DATA: Left arm itching. Swelling in feet.  EXAM: CHEST 2 VIEW  COMPARISON: Chest radiograph 03/05/2016  FINDINGS: Interstitial opacities and linear scarring are unchanged from the prior study. There is aortic arch atherosclerotic calcification. Otherwise, the cardiomediastinal contours are normal. No focal airspace consolidation or pulmonary edema. No pneumothorax or pleural effusion.  IMPRESSION: Unchanged chronic interstitial opacities without focal airspace disease.   Electronically Signed By: Ulyses Jarred M.D. On: 03/18/2016 01:08             ____________________________________________   PROCEDURES  Procedure(s) performed:   Procedures  Critical Care performed:   ____________________________________________   INITIAL IMPRESSION / ASSESSMENT AND PLAN / ED COURSE  Pertinent labs & imaging results that were available during my care of the patient were reviewed by me and considered in my medical decision making (see chart for details).   Clinical Course   ----------------------------------------- 6:36 AM on 03/18/2016 -----------------------------------------  Patient resting comfortably at this time. Reassuring lab workup. He says that he is able to take up his triamcinolone cream this there is a once a bridge until then. I'll be prescribing him cortisone cream as well as metoprolol and metformin.  He understands plan and is willing to comply. Unclear etiology of the rash but it appears to be chronic. We also  discussed using moisturizing lotions and the patient understands this plan and is willing to comply.   ____________________________________________   FINAL CLINICAL IMPRESSION(S) / ED DIAGNOSES  Rash. Hypertension.    NEW MEDICATIONS STARTED DURING THIS VISIT:  New Prescriptions   No medications on file     Note:  This document was prepared using Dragon voice recognition software and may include unintentional dictation errors.    Orbie Pyo, MD 03/18/16 [phone]

## 2016-03-18 NOTE — ED Notes (Signed)
Pt has been out of his meds x 3 weeks for BP.

## 2016-03-18 NOTE — ED Notes (Signed)
Pt resting in lobby with eyes closed; looked up when name called; vital signs rechecked; waiting patiently for treatment room

## 2016-04-08 NOTE — Progress Notes (Signed)
[name]  [phone]  [phone]     Manuel MEMOLI Sr. DOB: Feb 06, 1946  MR#: [id number]  ZDG#:[id number]  Patient Care Team: [name], MD as PCP - General (Family Medicine)  CHIEF COMPLAINT: [name]  INTERVAL HISTORY:   Patient is here for further follow-up regarding RAI stage 0 [name] diagnosed in 2014. Patient has been followed conservatively without intervention. Patient denies any acute illnesses or hospitalizations since his last visit 6 months ago. He does report having continuing issues with left leg weakness and foot drop, he was followed by vascular surgery and underwent revascularization procedure that he feels did not help. He denies any fever, chills, night sweats, chest pain, shortness of breath, diarrhea or constipation. He reports smoking 0.5 packs of cigarettes per day, increased recently, and states that he is ready to quit smoking and would like some assistance.  He reports poor oral hydration.  He offers no further complaints.  REVIEW OF SYSTEMS:   Review of Systems  Constitutional: Negative.  Negative for fever, malaise/fatigue and weight loss.  Respiratory: Negative.  Negative for cough and shortness of breath.   Cardiovascular: Negative.  Negative for chest pain and leg swelling.  Gastrointestinal: Negative.  Negative for abdominal pain.  Musculoskeletal: Negative.   Neurological: Negative for weakness.  Psychiatric/Behavioral: Negative.  The patient is not nervous/anxious.     As per HPI. Otherwise, a complete review of systems is negative.   PAST MEDICAL HISTORY: Past Medical History:  Diagnosis Date  . Anemia   . Diabetes ([place])   . GERD (gastroesophageal reflux disease)   . Hyperlipidemia   . Hypertension     PAST SURGICAL HISTORY: Past Surgical History:  Procedure Laterality Date  . CATARACT EXTRACTION W/PHACO Left 09/13/2014   Procedure: CATARACT EXTRACTION PHACO AND INTRAOCULAR LENS PLACEMENT (IOC);   Surgeon: Birder Robson, MD;  Location: [facility] ORS;  Service: Ophthalmology;  Laterality: Left;  [place] 01:03   . COLONOSCOPY  2014  . POPLITEAL ARTERY ANGIOPLASTY Left 2014  . PTCA Right    leg  . UPPER GASTROINTESTINAL ENDOSCOPY  2014    FAMILY HISTORY Family History  Problem Relation Age of Onset  . Diabetes Mother   . Diabetes Father     GYNECOLOGIC HISTORY:  No LMP for male patient.     ADVANCED DIRECTIVES:    HEALTH MAINTENANCE: Social History  Substance Use Topics  . Smoking status: Current Every Day Smoker    Packs/day: 0.50    Types: Cigarettes  . Smokeless tobacco: Never Used  . Alcohol use No      No Known Allergies  Current Outpatient Prescriptions  Medication Sig Dispense Refill  . albuterol (PROVENTIL HFA;VENTOLIN HFA) 108 (90 Base) MCG/ACT inhaler Inhale 2 puffs into the lungs every 6 (six) hours as needed for wheezing or shortness of breath. 1 Inhaler 0  . aspirin 81 MG tablet Take 1 tablet by mouth daily.    . chlorpheniramine-HYDROcodone (TUSSIONEX PENNKINETIC ER) 10-8 MG/5ML SUER Take 5 mLs by mouth 2 (two) times daily. 140 mL 0  . gabapentin (NEURONTIN) 300 MG capsule Take 1 capsule (300 mg total) by mouth 3 (three) times daily. 270 capsule 1  . glucose blood test strip Use as instructed 100 each 12  . Lancets 28G MISC 1 each by Does not apply route daily. 100 each 3  . lisinopril (PRINIVIL,ZESTRIL) 40 MG tablet Take 1 tablet (40 mg total) by mouth 2 (two) times daily. 180 tablet 3  . metFORMIN (  GLUCOPHAGE) 500 MG tablet Take 1 tablet (500 mg total) by mouth 2 (two) times daily with a meal. 60 tablet 0  . metoprolol tartrate (LOPRESSOR) 25 MG tablet Take 1 tablet (25 mg total) by mouth 2 (two) times daily. 60 tablet 0  . mupirocin ointment (BACTROBAN) 2 % Apply to affected area 3 times daily 22 g 0  . pantoprazole (PROTONIX) 40 MG tablet Take 1 tablet (40 mg total) by mouth daily. 90 tablet 3  . simvastatin (ZOCOR) 20 MG tablet Take 1 tablet (20 mg  total) by mouth at bedtime. 90 tablet 3  . triamcinolone cream (KENALOG) 0.1 % Apply 1 application topically 2 (two) times daily. 120 g 1  . vitamin B-12 (CYANOCOBALAMIN) 100 MCG tablet Take 1 tablet (100 mcg total) by mouth daily. Reported on 07/04/2015 90 tablet 3  . hydrocortisone 1 % lotion Apply 1 application topically 2 (two) times daily. Stop using when you get your Triamcinolone (Kenalog) (Patient not taking: Reported on 04/09/2016) 59 mL 0  . ondansetron (ZOFRAN ODT) 4 MG disintegrating tablet Take 1 tablet (4 mg total) by mouth every 8 (eight) hours as needed for nausea or vomiting. (Patient not taking: Reported on 04/09/2016) 20 tablet 0   No current facility-administered medications for this visit.     OBJECTIVE: BP (!) 169/71 (BP Location: Right Arm, Patient Position: Sitting)   Pulse (!) 42   Temp (!) 96.9 F (36.1 C) (Tympanic)   Resp 18   Wt 136 lb 7.4 oz (61.9 kg)   SpO2 100%   BMI 21.37 kg/m    Body mass index is 21.37 kg/m.    ECOG FS:0 - Asymptomatic  General: Well-developed, well-nourished, no acute distress. Eyes: Pink conjunctiva, anicteric sclera. Lungs: Clear to auscultation bilaterally. Heart: Regular rate and rhythm. No rubs, murmurs, or gallops. Abdomen: Soft, nontender, nondistended. No organomegaly noted, normoactive bowel sounds. Musculoskeletal: No edema, cyanosis, or clubbing. Neuro: Alert, answering all questions appropriately. Cranial nerves grossly intact. Skin: No rashes or petechiae noted. Psych: Normal affect.  LAB RESULTS:  Appointment on 04/09/2016  Component Date Value Ref Range Status  . WBC 04/09/2016 6.3  3.8 - 10.6 K/uL Final  . RBC 04/09/2016 4.22* 4.40 - 5.90 MIL/uL Final  . Hemoglobin 04/09/2016 12.7* 13.0 - 18.0 g/dL Final  . HCT 04/09/2016 38.7* 40.0 - 52.0 % Final  . MCV 04/09/2016 91.6  80.0 - 100.0 fL Final  . MCH 04/09/2016 30.1  26.0 - 34.0 pg Final  . MCHC 04/09/2016 32.9  32.0 - 36.0 g/dL Final  . RDW 04/09/2016 15.5* 11.5  - 14.5 % Final  . Platelets 04/09/2016 126* 150 - 440 K/uL Final  . Neutrophils Relative % 04/09/2016 40  % Final  . Neutro Abs 04/09/2016 2.5  1.4 - 6.5 K/uL Final  . Lymphocytes Relative 04/09/2016 48  % Final  . Lymphs Abs 04/09/2016 3.0  1.0 - 3.6 K/uL Final  . Monocytes Relative 04/09/2016 7  % Final  . Monocytes Absolute 04/09/2016 0.4  0.2 - 1.0 K/uL Final  . Eosinophils Relative 04/09/2016 3  % Final  . Eosinophils Absolute 04/09/2016 0.2  0 - 0.7 K/uL Final  . Basophils Relative 04/09/2016 2  % Final  . Basophils Absolute 04/09/2016 0.1  0 - 0.1 K/uL Final  . Sodium 04/09/2016 134* 135 - 145 mmol/L Final  . Potassium 04/09/2016 5.8* 3.5 - 5.1 mmol/L Final  . Chloride 04/09/2016 102  101 - 111 mmol/L Final  . CO2 04/09/2016 25  22 - 32 mmol/L Final  . Glucose, Bld 04/09/2016 180* 65 - 99 mg/dL Final  . BUN 04/09/2016 20  6 - 20 mg/dL Final  . Creatinine, Ser 04/09/2016 1.48* 0.61 - 1.24 mg/dL Final  . Calcium 04/09/2016 9.3  8.9 - 10.3 mg/dL Final  . Total Protein 04/09/2016 7.8  6.5 - 8.1 g/dL Final  . Albumin 04/09/2016 3.8  3.5 - 5.0 g/dL Final  . AST 04/09/2016 18  15 - 41 U/L Final  . ALT 04/09/2016 10* 17 - 63 U/L Final  . Alkaline Phosphatase 04/09/2016 81  38 - 126 U/L Final  . Total Bilirubin 04/09/2016 0.6  0.3 - 1.2 mg/dL Final  . GFR calc non Af Amer 04/09/2016 47* >60 mL/min Final  . GFR calc Af Amer 04/09/2016 54* >60 mL/min Final   Comment: (NOTE) The eGFR has been calculated using the CKD EPI equation. This calculation has not been validated in all clinical situations. eGFR's persistently <60 mL/min signify possible Chronic Kidney Disease.   . Anion gap 04/09/2016 7  5 - 15 Final  . LDH 04/09/2016 222* 98 - 192 U/L Final    STUDIES: No results found.  ASSESSMENT:  [name], Rai stage 0.  PLAN:  1. [name]: Patient was originally diagnosed in 2014 and has been under observation only. His blood counts remain stable including WBC of 6.3, platelets 126,000,  hemoglobin 12.7. Quantitative immunoglobulins and FISH panel were last performed Nov 2016.  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 2. Mild thrombocytopenia. Thrombocytopenia could be secondary to ITP or bone marrow involvement with [name]. Due to stability of hematologic parameters no further evaluation is necessary 3. Elevated creatinine: Discussed drinking more fluids with patient. He expressed willingness to try. 4. Smoking cessation: Patient continues to smoke and is now prepared to quit. He would like some assistance.  Suggest patient discuss this further with his PCP.   Patient will return to clinic in approximately 6 months for further lab evaluation and provider visit.  Patient expressed understanding and was in agreement with this plan. He also understands that He can call clinic at any time with any questions, concerns, or complaints.   Lucendia Herrlich, NP  04/09/16 4:16 PM   Patient was seen and evaluated independently and I agree with the assessment and plan as dictated above.  Lloyd Huger, MD 04/10/16 1:35 PM

## 2016-04-09 ENCOUNTER — Inpatient Hospital Stay: Payer: Medicare Other | Attending: Oncology | Admitting: Oncology

## 2016-04-09 ENCOUNTER — Inpatient Hospital Stay: Payer: Medicare Other

## 2016-04-09 VITALS — BP 169/71 | HR 42 | Temp 96.9°F | Resp 18 | Wt 136.5 lb

## 2016-04-09 DIAGNOSIS — C9110 Chronic lymphocytic leukemia of B-cell type not having achieved remission: Secondary | ICD-10-CM

## 2016-04-09 DIAGNOSIS — Z79899 Other long term (current) drug therapy: Secondary | ICD-10-CM | POA: Diagnosis not present

## 2016-04-09 DIAGNOSIS — I10 Essential (primary) hypertension: Secondary | ICD-10-CM | POA: Diagnosis not present

## 2016-04-09 DIAGNOSIS — D696 Thrombocytopenia, unspecified: Secondary | ICD-10-CM | POA: Diagnosis not present

## 2016-04-09 DIAGNOSIS — F17210 Nicotine dependence, cigarettes, uncomplicated: Secondary | ICD-10-CM

## 2016-04-09 DIAGNOSIS — R944 Abnormal results of kidney function studies: Secondary | ICD-10-CM

## 2016-04-09 DIAGNOSIS — E785 Hyperlipidemia, unspecified: Secondary | ICD-10-CM | POA: Insufficient documentation

## 2016-04-09 DIAGNOSIS — K219 Gastro-esophageal reflux disease without esophagitis: Secondary | ICD-10-CM | POA: Diagnosis not present

## 2016-04-09 DIAGNOSIS — E119 Type 2 diabetes mellitus without complications: Secondary | ICD-10-CM | POA: Insufficient documentation

## 2016-04-09 LAB — LACTATE DEHYDROGENASE: LDH: 222 U/L — AB (ref 98–192)

## 2016-04-09 LAB — COMPREHENSIVE METABOLIC PANEL
ALK PHOS: 81 U/L (ref 38–126)
ALT: 10 U/L — AB (ref 17–63)
AST: 18 U/L (ref 15–41)
Albumin: 3.8 g/dL (ref 3.5–5.0)
Anion gap: 7 (ref 5–15)
BILIRUBIN TOTAL: 0.6 mg/dL (ref 0.3–1.2)
BUN: 20 mg/dL (ref 6–20)
CO2: 25 mmol/L (ref 22–32)
CREATININE: 1.48 mg/dL — AB (ref 0.61–1.24)
Calcium: 9.3 mg/dL (ref 8.9–10.3)
Chloride: 102 mmol/L (ref 101–111)
GFR calc Af Amer: 54 mL/min — ABNORMAL LOW (ref >60)
GFR, EST NON AFRICAN AMERICAN: 47 mL/min — AB (ref >60)
GLUCOSE: 180 mg/dL — AB (ref 65–99)
Potassium: 5.8 mmol/L — ABNORMAL HIGH (ref 3.5–5.1)
Sodium: 134 mmol/L — ABNORMAL LOW (ref 135–145)
TOTAL PROTEIN: 7.8 g/dL (ref 6.5–8.1)

## 2016-04-09 LAB — CBC WITH DIFFERENTIAL/PLATELET
BASOS ABS: 0.1 10*3/uL (ref 0–0.1)
Basophils Relative: 2 %
Eosinophils Absolute: 0.2 10*3/uL (ref 0–0.7)
Eosinophils Relative: 3 %
HEMATOCRIT: 38.7 % — AB (ref 40.0–52.0)
HEMOGLOBIN: 12.7 g/dL — AB (ref 13.0–18.0)
LYMPHS PCT: 48 %
Lymphs Abs: 3 10*3/uL (ref 1.0–3.6)
MCH: 30.1 pg (ref 26.0–34.0)
MCHC: 32.9 g/dL (ref 32.0–36.0)
MCV: 91.6 fL (ref 80.0–100.0)
MONO ABS: 0.4 10*3/uL (ref 0.2–1.0)
Monocytes Relative: 7 %
NEUTROS ABS: 2.5 10*3/uL (ref 1.4–6.5)
NEUTROS PCT: 40 %
Platelets: 126 10*3/uL — ABNORMAL LOW (ref 150–440)
RBC: 4.22 MIL/uL — AB (ref 4.40–5.90)
RDW: 15.5 % — ABNORMAL HIGH (ref 11.5–14.5)
WBC: 6.3 10*3/uL (ref 3.8–10.6)

## 2016-04-09 NOTE — Progress Notes (Signed)
Patient is here for follow up, he is with his daughter, she is wanting to know more about her fathers condition.

## 2016-04-16 ENCOUNTER — Other Ambulatory Visit: Payer: Self-pay | Admitting: Internal Medicine

## 2016-04-16 MED ORDER — METFORMIN HCL 500 MG PO TABS: 500.0000 mg | ORAL_TABLET | Freq: Two times a day (BID) | ORAL | 1 refills | Status: DC

## 2016-04-19 ENCOUNTER — Other Ambulatory Visit: Payer: Self-pay | Admitting: Internal Medicine

## 2016-04-19 DIAGNOSIS — L309 Dermatitis, unspecified: Secondary | ICD-10-CM

## 2016-05-14 ENCOUNTER — Other Ambulatory Visit: Payer: Self-pay | Admitting: Internal Medicine

## 2016-05-14 DIAGNOSIS — L309 Dermatitis, unspecified: Secondary | ICD-10-CM

## 2016-06-26 ENCOUNTER — Other Ambulatory Visit: Payer: Self-pay | Admitting: Internal Medicine

## 2016-06-26 DIAGNOSIS — L309 Dermatitis, unspecified: Secondary | ICD-10-CM

## 2016-06-28 ENCOUNTER — Other Ambulatory Visit: Payer: Self-pay | Admitting: Internal Medicine

## 2016-06-28 DIAGNOSIS — E1142 Type 2 diabetes mellitus with diabetic polyneuropathy: Secondary | ICD-10-CM

## 2016-06-29 ENCOUNTER — Encounter: Payer: Self-pay | Admitting: Internal Medicine

## 2016-07-01 NOTE — Telephone Encounter (Signed)
Pt informed- Left VM.

## 2016-08-07 ENCOUNTER — Emergency Department (HOSPITAL_COMMUNITY): Payer: Medicare Other

## 2016-08-07 ENCOUNTER — Encounter (HOSPITAL_COMMUNITY): Payer: Self-pay | Admitting: Emergency Medicine

## 2016-08-07 ENCOUNTER — Emergency Department (HOSPITAL_COMMUNITY)
Admission: EM | Admit: 2016-08-07 | Discharge: 2016-08-07 | Disposition: A | Discharge status: Home / Self Care | Payer: Medicare Other | Attending: Emergency Medicine | Admitting: Emergency Medicine

## 2016-08-07 DIAGNOSIS — E1122 Type 2 diabetes mellitus with diabetic chronic kidney disease: Secondary | ICD-10-CM | POA: Insufficient documentation

## 2016-08-07 DIAGNOSIS — T07XXXA Unspecified multiple injuries, initial encounter: Secondary | ICD-10-CM

## 2016-08-07 DIAGNOSIS — L309 Dermatitis, unspecified: Secondary | ICD-10-CM

## 2016-08-07 DIAGNOSIS — Y939 Activity, unspecified: Secondary | ICD-10-CM | POA: Insufficient documentation

## 2016-08-07 DIAGNOSIS — S8992XA Unspecified injury of left lower leg, initial encounter: Secondary | ICD-10-CM | POA: Diagnosis present

## 2016-08-07 DIAGNOSIS — E1140 Type 2 diabetes mellitus with diabetic neuropathy, unspecified: Secondary | ICD-10-CM | POA: Insufficient documentation

## 2016-08-07 DIAGNOSIS — I129 Hypertensive chronic kidney disease with stage 1 through stage 4 chronic kidney disease, or unspecified chronic kidney disease: Secondary | ICD-10-CM | POA: Insufficient documentation

## 2016-08-07 DIAGNOSIS — N183 Chronic kidney disease, stage 3 (moderate): Secondary | ICD-10-CM | POA: Insufficient documentation

## 2016-08-07 DIAGNOSIS — I739 Peripheral vascular disease, unspecified: Secondary | ICD-10-CM | POA: Insufficient documentation

## 2016-08-07 DIAGNOSIS — Z7984 Long term (current) use of oral hypoglycemic drugs: Secondary | ICD-10-CM | POA: Insufficient documentation

## 2016-08-07 DIAGNOSIS — Y999 Unspecified external cause status: Secondary | ICD-10-CM | POA: Insufficient documentation

## 2016-08-07 DIAGNOSIS — F17210 Nicotine dependence, cigarettes, uncomplicated: Secondary | ICD-10-CM | POA: Diagnosis not present

## 2016-08-07 DIAGNOSIS — S80812A Abrasion, left lower leg, initial encounter: Secondary | ICD-10-CM | POA: Insufficient documentation

## 2016-08-07 DIAGNOSIS — Y929 Unspecified place or not applicable: Secondary | ICD-10-CM | POA: Insufficient documentation

## 2016-08-07 DIAGNOSIS — M7989 Other specified soft tissue disorders: Secondary | ICD-10-CM | POA: Diagnosis not present

## 2016-08-07 DIAGNOSIS — Z79899 Other long term (current) drug therapy: Secondary | ICD-10-CM | POA: Insufficient documentation

## 2016-08-07 DIAGNOSIS — X58XXXA Exposure to other specified factors, initial encounter: Secondary | ICD-10-CM | POA: Diagnosis not present

## 2016-08-07 DIAGNOSIS — R609 Edema, unspecified: Secondary | ICD-10-CM

## 2016-08-07 DIAGNOSIS — Z7982 Long term (current) use of aspirin: Secondary | ICD-10-CM | POA: Insufficient documentation

## 2016-08-07 DIAGNOSIS — S50811A Abrasion of right forearm, initial encounter: Secondary | ICD-10-CM | POA: Insufficient documentation

## 2016-08-07 LAB — COMPREHENSIVE METABOLIC PANEL
ALT: 7 U/L — ABNORMAL LOW (ref 17–63)
ANION GAP: 7 (ref 5–15)
AST: 11 U/L — ABNORMAL LOW (ref 15–41)
Albumin: 3.2 g/dL — ABNORMAL LOW (ref 3.5–5.0)
Alkaline Phosphatase: 98 U/L (ref 38–126)
BUN: 29 mg/dL — ABNORMAL HIGH (ref 6–20)
CALCIUM: 8.9 mg/dL (ref 8.9–10.3)
CHLORIDE: 107 mmol/L (ref 101–111)
CO2: 26 mmol/L (ref 22–32)
Creatinine, Ser: 1.77 mg/dL — ABNORMAL HIGH (ref 0.61–1.24)
GFR calc non Af Amer: 37 mL/min — ABNORMAL LOW (ref >60)
GFR, EST AFRICAN AMERICAN: 43 mL/min — AB (ref >60)
Glucose, Bld: 153 mg/dL — ABNORMAL HIGH (ref 65–99)
POTASSIUM: 4.7 mmol/L (ref 3.5–5.1)
SODIUM: 140 mmol/L (ref 135–145)
Total Bilirubin: 0.5 mg/dL (ref 0.3–1.2)
Total Protein: 7.2 g/dL (ref 6.5–8.1)

## 2016-08-07 LAB — URINALYSIS, ROUTINE W REFLEX MICROSCOPIC
Bacteria, UA: NONE SEEN
Bilirubin Urine: NEGATIVE
GLUCOSE, UA: NEGATIVE mg/dL
HGB URINE DIPSTICK: NEGATIVE
Ketones, ur: NEGATIVE mg/dL
Leukocytes, UA: NEGATIVE
Nitrite: NEGATIVE
PH: 5 (ref 5.0–8.0)
Protein, ur: 100 mg/dL — AB
SQUAMOUS EPITHELIAL / LPF: NONE SEEN
Specific Gravity, Urine: 1.015 (ref 1.005–1.030)

## 2016-08-07 LAB — CBC WITH DIFFERENTIAL/PLATELET
Basophils Absolute: 0 10*3/uL (ref 0.0–0.1)
Basophils Relative: 0 %
Eosinophils Absolute: 0.4 10*3/uL (ref 0.0–0.7)
Eosinophils Relative: 4 %
HCT: 36.8 % — ABNORMAL LOW (ref 39.0–52.0)
Hemoglobin: 11.7 g/dL — ABNORMAL LOW (ref 13.0–17.0)
LYMPHS ABS: 4.2 10*3/uL — AB (ref 0.7–4.0)
LYMPHS PCT: 40 %
MCH: 29.8 pg (ref 26.0–34.0)
MCHC: 31.8 g/dL (ref 30.0–36.0)
MCV: 93.6 fL (ref 78.0–100.0)
MONO ABS: 0.7 10*3/uL (ref 0.1–1.0)
MONOS PCT: 6 %
NEUTROS ABS: 5.2 10*3/uL (ref 1.7–7.7)
Neutrophils Relative %: 50 %
PLATELETS: 204 10*3/uL (ref 150–400)
RBC: 3.93 MIL/uL — AB (ref 4.22–5.81)
RDW: 13.7 % (ref 11.5–15.5)
WBC: 10.5 10*3/uL (ref 4.0–10.5)

## 2016-08-07 MED ORDER — TRIAMCINOLONE ACETONIDE 0.1 % EX CREA: TOPICAL_CREAM | CUTANEOUS | 0 refills | Status: DC

## 2016-08-07 MED ORDER — MUPIROCIN 2 % EX OINT: TOPICAL_OINTMENT | CUTANEOUS | 0 refills | Status: DC

## 2016-08-07 NOTE — Discharge Instructions (Signed)
Try to drink 2 glasses of water each day to improve your kidney function.  Follow-up with your primary care doctor, and a vascular doctor for further care, in the next few weeks.  The rash and itchiness, is primarily caused by dry skin.  A good moisturizing cream to use is Lubriderm, 2 or 3 times a day.  You can use this on all of the areas of dry skin, and discoloration, except the open areas.  On the open areas use Bactroban.  On areas of the skin that appear thickened and scaly, use the triamcinolone cream twice a day.  There was no apparent cause for swelling of the left ankle.  If it is uncomfortable, take Tylenol for pain.  Try to get some regular exercise.

## 2016-08-07 NOTE — ED Provider Notes (Signed)
[place] [facility] DEPT Provider Note   CSN: [id number] Arrival date & time: 08/07/16  1359     History   Chief Complaint Chief Complaint  Patient presents with  . Leg Pain  . Leg Swelling    HPI [name] is a 71 y.o. male.  Patient is here for evaluation of a sore on his left lower leg, which his son found out about today.  His son is worried that it is a diabetic complication.  He is also worried that the patient is "neglecting himself", meaning not taking care of himself appropriately.  Last week his son went to the patient's home in [place] and brought him to [place] to help stabilize his situation.  The son does not know a lot about the patient's care, but has some familiarity with his medical history.  Apparently when he can get his father stabilize he wants to take him back to [place].  The patient states that he has been seeing his doctor regularly.  He states that he has sores on his left arm and left leg, which it and he has been scratching.  The patient is a somewhat poor historian.  He denies fever, chills, cough, weakness or dizziness.  He states that he is taking his medication, as prescribed.  The patient denies known cause for the sores on his skin.  There are no other known modifying factors.    HPI  Past Medical History:  Diagnosis Date  . Anemia   . Diabetes ([place])   . GERD (gastroesophageal reflux disease)   . Hyperlipidemia   . Hypertension     Patient Active Problem List   Diagnosis Date Noted  . Dermatitis 03/01/2016  . Anemia, pernicious 07/04/2015  . Hip pain 07/04/2015  . Cardiac murmur 02/19/2015  . Chronic lymphocytic leukemia ([place]) 02/19/2015  . DM type 2 with diabetic peripheral neuropathy ([place]) 02/19/2015  . Essential (primary) hypertension 02/19/2015  . DM type 2 with diabetic mixed hyperlipidemia ([place]) 02/19/2015  . Diabetic peripheral neuropathy associated with type 2 diabetes mellitus ([place]) 02/19/2015  .  Bleeding ulcer 02/19/2015  . Peripheral vascular disease ([place]) 02/19/2015  . 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 Encounter for screening for malignant neoplasm of prostate 02/19/2015  . Type 2 diabetes mellitus with stage 3 chronic kidney disease ([place]) 02/19/2015  . Compulsive tobacco user syndrome 02/19/2015  . 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  . Aortic valve defect 12/22/2012  . 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[place]) 11/13/2011    Past Surgical History:  Procedure Laterality Date  . CATARACT EXTRACTION W/PHACO Left 09/13/2014   Procedure: CATARACT EXTRACTION PHACO AND INTRAOCULAR LENS PLACEMENT (IOC);  Surgeon: Birder Robson, MD;  Location: [facility] ORS;  Service: Ophthalmology;  Laterality: Left;  [place] 01:03   . COLONOSCOPY  2014  . POPLITEAL ARTERY ANGIOPLASTY Left 2014  . PTCA Right    leg  . UPPER GASTROINTESTINAL ENDOSCOPY  2014       Home Medications    Prior to Admission medications   Medication Sig Start Date End Date Taking? Authorizing Provider  aspirin 81 MG tablet Take 1 tablet by mouth daily.   Yes [provider]  gabapentin (NEURONTIN) 300 MG capsule TAKE 1 CAPSULE BY MOUTH 3  TIMES DAILY 06/29/16  Yes Manuel Hess, MD  lisinopril (PRINIVIL,ZESTRIL) 40 MG tablet Take 1 tablet (40 mg total) by mouth 2 (two) times daily. 10/24/15  Yes Manuel Hess, MD  metFORMIN (  GLUCOPHAGE) 500 MG tablet Take 1 tablet (500 mg total) by mouth 2 (two) times daily with a meal. 04/16/16 04/16/17 Yes Manuel Hess, MD  metoprolol tartrate (LOPRESSOR) 25 MG tablet Take 1 tablet (25 mg total) by mouth 2 (two) times daily. 03/18/16 03/18/17 Yes Schaevitz, Randall An, MD  simvastatin (ZOCOR) 20 MG tablet Take 1 tablet (20 mg total) by mouth at bedtime. 10/24/15  Yes Manuel Hess, MD  glucose blood test strip Use as instructed 03/06/15   Manuel Hess, MD  Lancets 28G MISC 1 each by Does not apply route daily.  03/06/15   Manuel Hess, MD  mupirocin ointment Drue Stager) 2 % Apply to affected area 3 times daily 08/07/16 08/07/17  Manuel Bo, MD  triamcinolone cream (KENALOG) 0.1 % APPLY TO AFFECTED AREA TWICE A DAY 08/07/16   Manuel Bo, MD    Family History Family History  Problem Relation Age of Onset  . Diabetes Mother   . Diabetes Father     Social History Social History  Substance Use Topics  . Smoking status: Current Every Day Smoker    Packs/day: 0.50    Types: Cigarettes  . Smokeless tobacco: Never Used  . Alcohol use No     Allergies   Patient has no known allergies.   Review of Systems Review of Systems  All other systems reviewed and are negative.    Physical Exam Updated Vital Signs BP (!) 143/95   Pulse (!) 51   Temp 98.2 F (36.8 C) (Oral)   Resp 14   SpO2 98%   Physical Exam  Constitutional: He is oriented to person, place, and time. He appears well-developed. No distress.  Elderly, frail  HENT:  Head: Normocephalic and atraumatic.  Right Ear: External ear normal.  Left Ear: External ear normal.  Eyes: Conjunctivae and EOM are normal. Pupils are equal, round, and reactive to light.  Neck: Normal range of motion and phonation normal. Neck supple.  Cardiovascular: Normal rate, regular rhythm and normal heart sounds.   Mild hypertension.  Left foot cool to touch.  No palpable foot pulses.  With Doppler, right posterior tibial pulse, biphasic.  With Doppler left dorsalis pedis pulse monophasic.  Pulmonary/Chest: Effort normal and breath sounds normal. He exhibits no bony tenderness.  Abdominal: Soft. There is no tenderness.  Musculoskeletal: Normal range of motion. He exhibits no deformity.  Left ankle diffusely swollen but nontender.  Left foot is somewhat cool to touch, compared to the right.  Both feet are nontender to palpation and without deformity or skin lesion.  Neurological: He is alert and oriented to person, place, and time. No cranial  nerve deficit or sensory deficit. He exhibits normal muscle tone. Coordination normal.  Skin: Skin is warm, dry and intact.  Superficial abrasions, left lower leg, and right forearm region.  They range between 1 and 2 cm, and are superficial.  There is a small amount of surrounding thickened skin, somewhat dry and scaly.  There is no associated induration, fluctuance, purulent drainage or leakage of subcutaneous fluid.  Psychiatric: He has a normal mood and affect. His behavior is normal.  Nursing note and vitals reviewed.    ED Treatments / Results  Labs (all labs ordered are listed, but only abnormal results are displayed) Labs Reviewed  COMPREHENSIVE METABOLIC PANEL - Abnormal; Notable for the following:       Result Value   Glucose, Bld 153 (*)    BUN 29 (*)    Creatinine,  Ser 1.77 (*)    Albumin 3.2 (*)    AST 11 (*)    ALT 7 (*)    GFR calc non Af Amer 37 (*)    GFR calc Af Amer 43 (*)    All other components within normal limits  CBC WITH DIFFERENTIAL/PLATELET - Abnormal; Notable for the following:    RBC 3.93 (*)    Hemoglobin 11.7 (*)    HCT 36.8 (*)    Lymphs Abs 4.2 (*)    All other components within normal limits  URINALYSIS, ROUTINE W REFLEX MICROSCOPIC - Abnormal; Notable for the following:    Protein, ur 100 (*)    All other components within normal limits    EKG  EKG Interpretation None       Radiology Dg Ankle Complete Left  Result Date: 08/07/2016 CLINICAL DATA:  Swelling and pain in left ankle and foot x 4-5 weeks per pt; no injury EXAM: LEFT ANKLE COMPLETE - 3+ VIEW COMPARISON:  05/16/2012, 04/27/2012 FINDINGS: There is significant medial soft tissue swelling. No acute fracture or subluxation is identified. No radiopaque foreign body or soft tissue gas. Vascular calcifications are present. IMPRESSION: Soft tissue swelling without acute osseous abnormality. Electronically Signed   By: Nolon Nations M.D.   On: 08/07/2016 16:22     Procedures Procedures (including critical care time)  Medications Ordered in ED Medications - No data to display   Initial Impression / Assessment and Plan / ED Course  I have reviewed the triage vital signs and the nursing notes.  Pertinent labs & imaging results that were available during my care of the patient were reviewed by me and considered in my medical decision making (see chart for details).     Medications - No data to display  Patient Vitals for the past 24 hrs:  BP Temp Temp src Pulse Resp SpO2  08/07/16 1651 (!) 143/95 - - (!) 51 14 98 %  08/07/16 1433 (!) 177/72 98.2 F (36.8 C) Oral (!) 55 16 100 %    5:27 PM Reevaluation with update and discussion. After initial assessment and treatment, an updated evaluation reveals clinical status unchanged.  Findings discussed with patient and son, all questions answered. Costas Sena L    Final Clinical Impressions(s) / ED Diagnoses   Final diagnoses:  Abrasion, multiple sites  Dermatitis  Peripheral vascular disease ([place])   Nonspecific symptoms, with reassuring evaluation.  Doubt acute changes and chronic condition.  Patient has previously been treated for rash, with triamcinolone and Bactroban.  Mild elevation of creatinine, from baseline, with low albumin.  Suspect elements of poor nutrition and fluid intake.  Patient is nontoxic.  Doubt serious bacterial infection metabolic instability or impending vascular collapse.  No signs of cellulitis associated with skin rash.  Nursing Notes Reviewed/ Care Coordinated Applicable Imaging Reviewed Interpretation of Laboratory Data incorporated into ED treatment  The patient appears reasonably screened and/or stabilized for discharge and I doubt any other medical condition or other [facility] requiring further screening, evaluation, or treatment in the ED at this time prior to discharge.  Plan: Home Medications- usual; Home Treatments- rest; return here if the recommended  treatment, does not improve the symptoms; Recommended follow up- PCP prn  New Prescriptions Current Discharge Medication List       Manuel Bo, MD 08/07/16 [phone]

## 2016-08-07 NOTE — ED Triage Notes (Signed)
Pt with Hx of DM c/o pruritis, edema, dry skin with hyperpigmented scales and open sores to bilateral lower extremities, with dry, thickened, hyperpigmented pruritic erythematous skin to bilateral forearms ongoing for over 1 year. Pain to bilateral lower extremities when ambulating, relieved when pt stands or sits to rest. Family member just became aware of them and brought pt to ED. No pain.

## 2016-08-14 ENCOUNTER — Ambulatory Visit (INDEPENDENT_AMBULATORY_CARE_PROVIDER_SITE_OTHER): Payer: Medicare Other | Admitting: Internal Medicine

## 2016-08-14 ENCOUNTER — Encounter: Payer: Self-pay | Admitting: Internal Medicine

## 2016-08-14 VITALS — BP 118/60 | HR 43 | Ht 67.0 in | Wt 141.0 lb

## 2016-08-14 DIAGNOSIS — C9110 Chronic lymphocytic leukemia of B-cell type not having achieved remission: Secondary | ICD-10-CM

## 2016-08-14 DIAGNOSIS — I739 Peripheral vascular disease, unspecified: Secondary | ICD-10-CM | POA: Diagnosis not present

## 2016-08-14 DIAGNOSIS — E1169 Type 2 diabetes mellitus with other specified complication: Secondary | ICD-10-CM

## 2016-08-14 DIAGNOSIS — L309 Dermatitis, unspecified: Secondary | ICD-10-CM

## 2016-08-14 DIAGNOSIS — C9190 Lymphoid leukemia, unspecified not having achieved remission: Secondary | ICD-10-CM

## 2016-08-14 DIAGNOSIS — E1142 Type 2 diabetes mellitus with diabetic polyneuropathy: Secondary | ICD-10-CM | POA: Diagnosis not present

## 2016-08-14 DIAGNOSIS — I10 Essential (primary) hypertension: Secondary | ICD-10-CM | POA: Diagnosis not present

## 2016-08-14 DIAGNOSIS — E782 Mixed hyperlipidemia: Secondary | ICD-10-CM

## 2016-08-14 MED ORDER — MUPIROCIN 2 % EX OINT: TOPICAL_OINTMENT | CUTANEOUS | 5 refills | Status: AC

## 2016-08-14 MED ORDER — ASPIRIN 81 MG PO TABS: 81.0000 mg | ORAL_TABLET | Freq: Every day | ORAL | 5 refills | Status: DC

## 2016-08-14 MED ORDER — MUPIROCIN 2 % EX OINT: TOPICAL_OINTMENT | CUTANEOUS | 5 refills | Status: DC
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
## 2016-08-15 LAB — COMPREHENSIVE METABOLIC PANEL
ALK PHOS: 110 IU/L (ref 39–117)
ALT: 7 IU/L (ref 0–44)
AST: 12 IU/L (ref 0–40)
Albumin/Globulin Ratio: 1.2 (ref 1.2–2.2)
Albumin: 4.1 g/dL (ref 3.6–4.8)
BUN/Creatinine Ratio: 13 (ref 10–24)
BUN: 25 mg/dL (ref 8–27)
Bilirubin Total: <0.2 mg/dL (ref 0.0–1.2)
CO2: 22 mmol/L (ref 18–29)
CREATININE: 1.92 mg/dL — AB (ref 0.76–1.27)
Calcium: 9.9 mg/dL (ref 8.6–10.2)
Chloride: 99 mmol/L (ref 96–106)
GFR calc Af Amer: 40 mL/min/{1.73_m2} — ABNORMAL LOW (ref >59)
GFR calc non Af Amer: 35 mL/min/{1.73_m2} — ABNORMAL LOW (ref >59)
GLOBULIN, TOTAL: 3.4 g/dL (ref 1.5–4.5)
Glucose: 222 mg/dL — ABNORMAL HIGH (ref 65–99)
POTASSIUM: 5.1 mmol/L (ref 3.5–5.2)
Sodium: 140 mmol/L (ref 134–144)
Total Protein: 7.5 g/dL (ref 6.0–8.5)

## 2016-08-15 LAB — HEMOGLOBIN A1C
Est. average glucose Bld gHb Est-mCnc: 174 mg/dL
HEMOGLOBIN A1C: 7.7 % — AB (ref 4.8–5.6)

## 2016-08-30 ENCOUNTER — Other Ambulatory Visit: Payer: Self-pay | Admitting: Internal Medicine

## 2016-08-30 DIAGNOSIS — E1142 Type 2 diabetes mellitus with diabetic polyneuropathy: Secondary | ICD-10-CM

## 2016-08-30 DIAGNOSIS — E1169 Type 2 diabetes mellitus with other specified complication: Secondary | ICD-10-CM

## 2016-08-30 DIAGNOSIS — E782 Mixed hyperlipidemia: Principal | ICD-10-CM

## 2016-09-12 ENCOUNTER — Emergency Department: Payer: Medicare Other

## 2016-09-12 ENCOUNTER — Inpatient Hospital Stay
Admission: EM | Admit: 2016-09-12 | Discharge: 2016-09-18 | DRG: 312 | Disposition: A | Discharge status: Skilled Nursing Facility | Payer: Medicare Other | Attending: Specialist | Admitting: Specialist

## 2016-09-12 DIAGNOSIS — R21 Rash and other nonspecific skin eruption: Secondary | ICD-10-CM | POA: Diagnosis present

## 2016-09-12 DIAGNOSIS — F05 Delirium due to known physiological condition: Secondary | ICD-10-CM | POA: Diagnosis not present

## 2016-09-12 DIAGNOSIS — N179 Acute kidney failure, unspecified: Secondary | ICD-10-CM | POA: Diagnosis not present

## 2016-09-12 DIAGNOSIS — D649 Anemia, unspecified: Secondary | ICD-10-CM | POA: Diagnosis not present

## 2016-09-12 DIAGNOSIS — C9110 Chronic lymphocytic leukemia of B-cell type not having achieved remission: Secondary | ICD-10-CM | POA: Diagnosis present

## 2016-09-12 DIAGNOSIS — F0390 Unspecified dementia without behavioral disturbance: Secondary | ICD-10-CM | POA: Diagnosis present

## 2016-09-12 DIAGNOSIS — F17210 Nicotine dependence, cigarettes, uncomplicated: Secondary | ICD-10-CM | POA: Diagnosis present

## 2016-09-12 DIAGNOSIS — E1142 Type 2 diabetes mellitus with diabetic polyneuropathy: Secondary | ICD-10-CM

## 2016-09-12 DIAGNOSIS — Z79899 Other long term (current) drug therapy: Secondary | ICD-10-CM

## 2016-09-12 DIAGNOSIS — E860 Dehydration: Secondary | ICD-10-CM | POA: Diagnosis present

## 2016-09-12 DIAGNOSIS — S069X9A Unspecified intracranial injury with loss of consciousness of unspecified duration, initial encounter: Secondary | ICD-10-CM | POA: Diagnosis not present

## 2016-09-12 DIAGNOSIS — K219 Gastro-esophageal reflux disease without esophagitis: Secondary | ICD-10-CM | POA: Diagnosis present

## 2016-09-12 DIAGNOSIS — E1151 Type 2 diabetes mellitus with diabetic peripheral angiopathy without gangrene: Secondary | ICD-10-CM | POA: Diagnosis present

## 2016-09-12 DIAGNOSIS — E1122 Type 2 diabetes mellitus with diabetic chronic kidney disease: Secondary | ICD-10-CM | POA: Diagnosis present

## 2016-09-12 DIAGNOSIS — R55 Syncope and collapse: Principal | ICD-10-CM | POA: Diagnosis present

## 2016-09-12 DIAGNOSIS — I129 Hypertensive chronic kidney disease with stage 1 through stage 4 chronic kidney disease, or unspecified chronic kidney disease: Secondary | ICD-10-CM | POA: Diagnosis present

## 2016-09-12 DIAGNOSIS — N183 Chronic kidney disease, stage 3 (moderate): Secondary | ICD-10-CM | POA: Diagnosis present

## 2016-09-12 DIAGNOSIS — Z7984 Long term (current) use of oral hypoglycemic drugs: Secondary | ICD-10-CM

## 2016-09-12 DIAGNOSIS — Z7982 Long term (current) use of aspirin: Secondary | ICD-10-CM

## 2016-09-12 DIAGNOSIS — E785 Hyperlipidemia, unspecified: Secondary | ICD-10-CM | POA: Diagnosis present

## 2016-09-12 HISTORY — DX: Leukemia, unspecified not having achieved remission: C95.90

## 2016-09-12 LAB — COMPREHENSIVE METABOLIC PANEL
ALBUMIN: 4 g/dL (ref 3.5–5.0)
ALT: 9 U/L — AB (ref 17–63)
AST: 15 U/L (ref 15–41)
Alkaline Phosphatase: 94 U/L (ref 38–126)
Anion gap: 9 (ref 5–15)
BUN: 35 mg/dL — AB (ref 6–20)
CHLORIDE: 105 mmol/L (ref 101–111)
CO2: 26 mmol/L (ref 22–32)
Calcium: 9.8 mg/dL (ref 8.9–10.3)
Creatinine, Ser: 2.09 mg/dL — ABNORMAL HIGH (ref 0.61–1.24)
GFR calc Af Amer: 36 mL/min — ABNORMAL LOW (ref >60)
GFR calc non Af Amer: 31 mL/min — ABNORMAL LOW (ref >60)
GLUCOSE: 101 mg/dL — AB (ref 65–99)
POTASSIUM: 4.2 mmol/L (ref 3.5–5.1)
Sodium: 140 mmol/L (ref 135–145)
Total Bilirubin: 0.7 mg/dL (ref 0.3–1.2)
Total Protein: 8.2 g/dL — ABNORMAL HIGH (ref 6.5–8.1)

## 2016-09-12 LAB — TROPONIN I: Troponin I: <0.03 ng/mL (ref <0.03)

## 2016-09-12 LAB — LACTIC ACID, PLASMA: Lactic Acid, Venous: 0.8 mmol/L (ref 0.5–1.9)

## 2016-09-12 NOTE — ED Provider Notes (Signed)
[facility] Emergency Department Provider Note    First MD Initiated Contact with Patient 09/12/16 2326     (approximate)  I have reviewed the triage vital signs and the nursing notes.  Level 71-year-old: History Limited secondary to altered mental status HISTORY  Chief Complaint Loss of Consciousness    HPI NAHIEM DREDGE Sr. is a 71 y.o. male with the below list of chronic medical conditions presents to the emergency department and his daughter's custody secondary to unwitnessed syncopal episode. Per the patient's daughter patient was found in his front yard with an approximate downtime of 30-40 minutes. Patient states that he believes that he may have tripped and fell however patient unable to state why he did not immediately get up after falling. Patient denies any preceding symptoms to "falling" as well as denies any symptoms at present.  Past Medical History:  Diagnosis Date  . Anemia   . Diabetes ([place])   . GERD (gastroesophageal reflux disease)   . Hyperlipidemia   . Hypertension   . Leukemia [facility])     Patient Active Problem List   Diagnosis Date Noted  . Dermatitis 03/01/2016  . Anemia, pernicious 07/04/2015  . Hip pain 07/04/2015  . Cardiac murmur 02/19/2015  . Chronic lymphocytic leukemia ([place]) 02/19/2015  . DM type 2 with diabetic peripheral neuropathy ([place]) 02/19/2015  . Essential (primary) hypertension 02/19/2015  . DM type 2 with diabetic mixed hyperlipidemia ([place]) 02/19/2015  . Diabetic peripheral neuropathy associated with type 2 diabetes mellitus ([place]) 02/19/2015  . Bleeding ulcer 02/19/2015  . Peripheral vascular disease ([place]) 02/19/2015  . 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 Encounter for screening for malignant neoplasm of prostate 02/19/2015  . Type 2 diabetes mellitus with stage 3 chronic kidney disease ([place]) 02/19/2015  . Compulsive tobacco user syndrome 02/19/2015  . Arteriovenous malformation of small  intestine 02/07/2015  . Aortic valve defect 12/22/2012  . 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[place]) 11/13/2011    Past Surgical History:  Procedure Laterality Date  . CATARACT EXTRACTION W/PHACO Left 09/13/2014   Procedure: CATARACT EXTRACTION PHACO AND INTRAOCULAR LENS PLACEMENT (IOC);  Surgeon: Birder Robson, MD;  Location: [facility] ORS;  Service: Ophthalmology;  Laterality: Left;  [place] 01:03   . COLONOSCOPY  2014  . POPLITEAL ARTERY ANGIOPLASTY Left 2014  . PTCA Right    leg  . UPPER GASTROINTESTINAL ENDOSCOPY  2014    Prior to Admission medications   Medication Sig Start Date End Date Taking? Authorizing Provider  aspirin 81 MG tablet Take 1 tablet (81 mg total) by mouth daily. 08/14/16  Yes [name], MD  gabapentin (NEURONTIN) 300 MG capsule TAKE 1 CAPSULE BY MOUTH 3  TIMES DAILY 08/30/16  Yes [name], MD  lisinopril (PRINIVIL,ZESTRIL) 40 MG tablet Take 1 tablet (40 mg total) by mouth 2 (two) times daily. 10/24/15  Yes [name], MD  metFORMIN (GLUCOPHAGE) 500 MG tablet Take 1 tablet (500 mg total) by mouth 2 (two) times daily with a meal. 04/16/16 04/16/17 Yes [name], MD  metoprolol tartrate (LOPRESSOR) 25 MG tablet Take 1 tablet (25 mg total) by mouth 2 (two) times daily. 03/18/16 03/18/17 Yes Orbie Pyo, MD  mupirocin ointment Drue Stager) 2 % Apply to affected area 3 times daily 08/14/16 08/14/17 Yes [name], MD  simvastatin (ZOCOR) 20 MG tablet TAKE 1 TABLET BY MOUTH AT  BEDTIME 08/30/16  Yes [name], MD  triamcinolone cream (KENALOG) 0.1 %  APPLY TO AFFECTED AREA TWICE A DAY 08/07/16  Yes Daleen Bo, MD  glucose blood test strip Use as instructed 03/06/15   [name], MD  Lancets 28G MISC 1 each by Does not apply route daily. 03/06/15   [name], MD    Allergies No known drug allergies  Family History  Problem Relation Age of Onset  . Diabetes Mother   . Diabetes Father     Social  History Social History  Substance Use Topics  . Smoking status: Current Every Day Smoker    Packs/day: 0.50    Types: Cigarettes  . Smokeless tobacco: Never Used  . Alcohol use No    Review of Systems Constitutional: No fever/chills Eyes: No visual changes. ENT: No sore throat. Cardiovascular: Denies chest pain. Respiratory: Denies shortness of breath. Gastrointestinal: No abdominal pain.  No nausea, no vomiting.  No diarrhea.  No constipation. Genitourinary: Negative for dysuria. Musculoskeletal: Negative for neck pain.  Negative for back pain. Integumentary: Negative for rash. Neurological: Negative for headaches, focal weakness or numbness. Positive for syncopal episode   ____________________________________________   PHYSICAL EXAM:  VITAL SIGNS: ED Triage Vitals  Enc Vitals Group     BP 09/12/16 2155 (!) 188/55     Pulse Rate 09/12/16 2155 (!) 54     Resp 09/12/16 2155 18     Temp 09/12/16 2155 98.5 F (36.9 C)     Temp Source 09/12/16 2155 Oral     SpO2 09/12/16 2155 100 %     Weight 09/12/16 2156 64.4 kg (142 lb)     Height 09/12/16 2156 1.727 m (5\' 8" )     Head Circumference --      Peak Flow --      Pain Score --      Pain Loc --      Pain Edu? --      Excl. in [place]? --     Constitutional: Alert But confused Well appearing and in no acute distress. Eyes: Conjunctivae are normal. PERRL. EOMI. Head: Atraumatic. Mouth/Throat: Mucous membranes are moist. Oropharynx non-erythematous. Neck: No stridor.  Cardiovascular: Normal rate, regular rhythm. Good peripheral circulation. Systolic ejection murmur noted right sternal border  Respiratory: Normal respiratory effort.  No retractions. Lungs CTAB. Gastrointestinal: Soft and nontender. No distention.  Musculoskeletal: No lower extremity tenderness nor edema. No gross deformities of extremities. Neurologic:  Normal speech and language. No gross focal neurologic deficits are appreciated.  Skin:  Skin is warm, dry  and intact. No rash noted. Psychiatric: Mood and affect are normal. Speech and behavior are normal.  ____________________________________________   LABS (all labs ordered are listed, but only abnormal results are displayed)  Labs Reviewed  COMPREHENSIVE METABOLIC PANEL - Abnormal; Notable for the following:       Result Value   Glucose, Bld 101 (*)    BUN 35 (*)    Creatinine, Ser 2.09 (*)    Total Protein 8.2 (*)    ALT 9 (*)    GFR calc non Af Amer 31 (*)    GFR calc Af Amer 36 (*)    All other components within normal limits  TROPONIN I  LACTIC ACID, PLASMA  CBC  URINALYSIS, COMPLETE (UACMP) WITH MICROSCOPIC  LACTIC ACID, PLASMA  RPR  CBG MONITORING, ED   ____________________________________________  EKG  ED ECG REPORT I, [place] N BROWN, the attending physician, personally viewed and interpreted this ECG.   Date: 09/13/2016  EKG Time: 9:56 PM  Rate: 57  Rhythm: Sinus bradycardia with premature atrial contractions  Axis: Normal  Intervals: Normal  ST&T Change: None  ____________________________________________  RADIOLOGY I, [place] N BROWN, personally viewed and evaluated these images (plain radiographs) as part of my medical decision making, as well as reviewing the written report by the radiologist.  Ct Head Wo Contrast  Result Date: 09/12/2016 CLINICAL DATA:  Status post fall, with loss of consciousness. Initial encounter. EXAM: CT HEAD WITHOUT CONTRAST TECHNIQUE: Contiguous axial images were obtained from the base of the skull through the vertex without intravenous contrast. COMPARISON:  None. FINDINGS: Brain: No evidence of acute infarction, hemorrhage, hydrocephalus, extra-axial collection or mass lesion/mass effect. Prominence of the ventricles and sulci reflects mild to moderate cortical volume loss. Cerebellar atrophy is noted. Scattered periventricular and subcortical white matter change likely reflects small vessel ischemic microangiopathy. Chronic  lacunar infarcts are seen at the basal ganglia and thalami bilaterally. The brainstem and fourth ventricle are within normal limits. The cerebral hemispheres demonstrate grossly normal gray-white differentiation. No mass effect or midline shift is seen. Vascular: No hyperdense vessel or unexpected calcification. Skull: There is no evidence of fracture; visualized osseous structures are unremarkable in appearance. Sinuses/Orbits: The visualized portions of the orbits are within normal limits. There is mild partial opacification of the left mastoid air cells. The paranasal sinuses and right mastoid air cells are well-aerated. Other: No significant soft tissue abnormalities are seen. IMPRESSION: 1. No evidence of traumatic intracranial injury or fracture. 2. Mild to moderate cortical volume loss scattered small vessel ischemic microangiopathy. 3. Chronic lacunar infarcts at the basal ganglia and thalami bilaterally. 4. Mild partial opacification of the left mastoid air cells. Electronically Signed   By: Garald Balding M.D.   On: 09/12/2016 22:35      Procedures   ____________________________________________   INITIAL IMPRESSION / ASSESSMENT AND PLAN / ED COURSE  Pertinent labs & imaging results that were available during my care of the patient were reviewed by me and considered in my medical decision making (see chart for details).  71 year old male presents to the emergency department with possible syncopal episode. Patient with murmur consistent with possible aortic stenosis. Patient discussed with Dr. Hedda Slade admission for further evaluation and management. Patient's daughter states that they've been trying to get her father into an assisted living facility secondary to concerns of dementia and is requesting case management input while in the hospital      ____________________________________________  FINAL CLINICAL IMPRESSION(S) / ED DIAGNOSES  Syncope  MEDICATIONS GIVEN DURING  THIS VISIT:  Medications - No data to display   NEW OUTPATIENT MEDICATIONS STARTED DURING THIS VISIT:  New Prescriptions   No medications on file    Modified Medications   No medications on file    Discontinued Medications   No medications on file     Note:  This document was prepared using Dragon voice recognition software and may include unintentional dictation errors.    Gregor Hams, MD 09/13/16 [phone]

## 2016-09-12 NOTE — ED Notes (Signed)
Unsuccessful IV start x1, patient's family states he is dehydrated and "always has this problem" regarding IV access.  Family states this patient rarely drinks water, only coffee and he has been outside today.  Will attempt [place] IV when patient returns from CT.

## 2016-09-12 NOTE — ED Notes (Signed)
Patient transported to CT 

## 2016-09-12 NOTE — ED Triage Notes (Signed)
Pt arrived with daughter.  Pt's daughter states that she was called by neighbors stating that pt fell out and had a loss of consciousness at home for an unknown amount of time.  Pt reporting that he just fell briefly.  Per daughter, pt also had urinary accident on himself as well.  Daughter reports that pt has had increasing forgetfulness that they have been trying to address with primary care doctor, but nothing has been done in that regard.  Pt is alert in triage, daughter accompanying patient at this time.

## 2016-09-13 ENCOUNTER — Observation Stay: Admit: 2016-09-13 | Payer: Medicare Other

## 2016-09-13 ENCOUNTER — Observation Stay (HOSPITAL_BASED_OUTPATIENT_CLINIC_OR_DEPARTMENT_OTHER)
Admit: 2016-09-13 | Discharge: 2016-09-13 | Disposition: A | Discharge status: Home / Self Care | Payer: Medicare Other | Attending: Internal Medicine | Admitting: Internal Medicine

## 2016-09-13 DIAGNOSIS — I10 Essential (primary) hypertension: Secondary | ICD-10-CM | POA: Diagnosis not present

## 2016-09-13 DIAGNOSIS — N179 Acute kidney failure, unspecified: Secondary | ICD-10-CM | POA: Diagnosis not present

## 2016-09-13 DIAGNOSIS — I5030 Unspecified diastolic (congestive) heart failure: Secondary | ICD-10-CM | POA: Diagnosis not present

## 2016-09-13 DIAGNOSIS — R55 Syncope and collapse: Secondary | ICD-10-CM | POA: Diagnosis present

## 2016-09-13 DIAGNOSIS — E119 Type 2 diabetes mellitus without complications: Secondary | ICD-10-CM | POA: Diagnosis not present

## 2016-09-13 LAB — CK: Total CK: 122 U/L (ref 49–397)

## 2016-09-13 LAB — URINALYSIS, COMPLETE (UACMP) WITH MICROSCOPIC
Bacteria, UA: NONE SEEN
Bilirubin Urine: NEGATIVE
Glucose, UA: NEGATIVE mg/dL
Ketones, ur: NEGATIVE mg/dL
Leukocytes, UA: NEGATIVE
Nitrite: NEGATIVE
Protein, ur: 100 mg/dL — AB
Specific Gravity, Urine: 1.009 (ref 1.005–1.030)
Squamous Epithelial / LPF: NONE SEEN
pH: 7 (ref 5.0–8.0)

## 2016-09-13 LAB — GLUCOSE, CAPILLARY
Glucose-Capillary: 114 mg/dL — ABNORMAL HIGH (ref 65–99)
Glucose-Capillary: 178 mg/dL — ABNORMAL HIGH (ref 65–99)
Glucose-Capillary: 213 mg/dL — ABNORMAL HIGH (ref 65–99)
Glucose-Capillary: 60 mg/dL — ABNORMAL LOW (ref 65–99)
Glucose-Capillary: 169 mg/dL — ABNORMAL HIGH (ref 65–99)

## 2016-09-13 LAB — CBC
HCT: 38.5 % — ABNORMAL LOW (ref 40.0–52.0)
HEMOGLOBIN: 12.6 g/dL — AB (ref 13.0–18.0)
MCH: 29.2 pg (ref 26.0–34.0)
MCHC: 32.8 g/dL (ref 32.0–36.0)
MCV: 89.1 fL (ref 80.0–100.0)
Platelets: 175 10*3/uL (ref 150–440)
RBC: 4.32 MIL/uL — AB (ref 4.40–5.90)
RDW: 14.7 % — ABNORMAL HIGH (ref 11.5–14.5)
WBC: 9.3 10*3/uL (ref 3.8–10.6)

## 2016-09-13 LAB — TSH: TSH: 3.829 u[IU]/mL (ref 0.350–4.500)

## 2016-09-13 MED ORDER — METOPROLOL TARTRATE 25 MG PO TABS
25.0000 mg | ORAL_TABLET | ORAL | Status: AC
  Administered 2016-09-13: 25 mg via ORAL
  Filled 2016-09-13: qty 1

## 2016-09-13 MED ORDER — QUETIAPINE FUMARATE 25 MG PO TABS
12.5000 mg | ORAL_TABLET | Freq: Every day | ORAL | Status: DC
  Administered 2016-09-14: 25 mg via ORAL
  Administered 2016-09-15 – 2016-09-17 (×3): 12.5 mg via ORAL
  Filled 2016-09-13 (×5): qty 1

## 2016-09-13 MED ORDER — LISINOPRIL 20 MG PO TABS
40.0000 mg | ORAL_TABLET | ORAL | Status: AC
  Administered 2016-09-13: 40 mg via ORAL
  Filled 2016-09-13: qty 2

## 2016-09-13 MED ORDER — SIMVASTATIN 20 MG PO TABS
20.0000 mg | ORAL_TABLET | Freq: Every day | ORAL | Status: DC
  Administered 2016-09-13 – 2016-09-17 (×5): 20 mg via ORAL
  Filled 2016-09-13 (×6): qty 1

## 2016-09-13 MED ORDER — INSULIN ASPART 100 UNIT/ML ~~LOC~~ SOLN
0.0000 [IU] | Freq: Every day | SUBCUTANEOUS | Status: DC
  Administered 2016-09-17: 2 [IU] via SUBCUTANEOUS
  Filled 2016-09-13: qty 1

## 2016-09-13 MED ORDER — HEPARIN SODIUM (PORCINE) 5000 UNIT/ML IJ SOLN
5000.0000 [IU] | Freq: Three times a day (TID) | INTRAMUSCULAR | Status: DC
  Administered 2016-09-13 – 2016-09-18 (×16): 5000 [IU] via SUBCUTANEOUS
  Filled 2016-09-13 (×16): qty 1

## 2016-09-13 MED ORDER — ASPIRIN 81 MG PO CHEW
81.0000 mg | CHEWABLE_TABLET | Freq: Every day | ORAL | Status: DC
  Administered 2016-09-13 – 2016-09-18 (×6): 81 mg via ORAL
  Filled 2016-09-13 (×6): qty 1

## 2016-09-13 MED ORDER — GABAPENTIN 300 MG PO CAPS
300.0000 mg | ORAL_CAPSULE | Freq: Three times a day (TID) | ORAL | Status: DC
  Administered 2016-09-13 – 2016-09-18 (×16): 300 mg via ORAL
  Filled 2016-09-13 (×16): qty 1

## 2016-09-13 MED ORDER — DOCUSATE SODIUM 100 MG PO CAPS
100.0000 mg | ORAL_CAPSULE | Freq: Two times a day (BID) | ORAL | Status: DC
  Administered 2016-09-13 – 2016-09-18 (×11): 100 mg via ORAL
  Filled 2016-09-13 (×11): qty 1

## 2016-09-13 MED ORDER — METOPROLOL TARTRATE 25 MG PO TABS
25.0000 mg | ORAL_TABLET | Freq: Two times a day (BID) | ORAL | Status: DC
  Administered 2016-09-13 – 2016-09-14 (×2): 25 mg via ORAL
  Filled 2016-09-13 (×3): qty 1

## 2016-09-13 MED ORDER — LABETALOL HCL 5 MG/ML IV SOLN
5.0000 mg | INTRAVENOUS | Status: DC | PRN
  Administered 2016-09-13: 5 mg via INTRAVENOUS
  Filled 2016-09-13: qty 4

## 2016-09-13 MED ORDER — ONDANSETRON HCL 4 MG/2ML IJ SOLN: 4.0000 mg | Freq: Four times a day (QID) | INTRAMUSCULAR | Status: DC | PRN

## 2016-09-13 MED ORDER — ACETAMINOPHEN 325 MG PO TABS: 650.0000 mg | ORAL_TABLET | Freq: Four times a day (QID) | ORAL | Status: DC | PRN

## 2016-09-13 MED ORDER — ACETAMINOPHEN 650 MG RE SUPP: 650.0000 mg | Freq: Four times a day (QID) | RECTAL | Status: DC | PRN

## 2016-09-13 MED ORDER — HYDRALAZINE HCL 25 MG PO TABS
25.0000 mg | ORAL_TABLET | Freq: Three times a day (TID) | ORAL | Status: DC
  Administered 2016-09-13 – 2016-09-14 (×3): 25 mg via ORAL
  Filled 2016-09-13 (×4): qty 1

## 2016-09-13 MED ORDER — LISINOPRIL 20 MG PO TABS
40.0000 mg | ORAL_TABLET | Freq: Two times a day (BID) | ORAL | Status: DC
  Administered 2016-09-13 – 2016-09-18 (×10): 40 mg via ORAL
  Filled 2016-09-13 (×10): qty 2

## 2016-09-13 MED ORDER — ONDANSETRON HCL 4 MG PO TABS
4.0000 mg | ORAL_TABLET | Freq: Four times a day (QID) | ORAL | Status: DC | PRN
Start: 2016-09-13 — End: 2016-09-18

## 2016-09-13 MED ORDER — HYDRALAZINE HCL 20 MG/ML IJ SOLN: 10.0000 mg | INTRAMUSCULAR | Status: DC | PRN

## 2016-09-13 MED ORDER — INSULIN ASPART 100 UNIT/ML ~~LOC~~ SOLN
0.0000 [IU] | Freq: Three times a day (TID) | SUBCUTANEOUS | Status: DC
Start: 2016-09-13 — End: 2016-09-18
  Administered 2016-09-13: 3 [IU] via SUBCUTANEOUS
  Administered 2016-09-13: 2 [IU] via SUBCUTANEOUS
  Administered 2016-09-14: 1 [IU] via SUBCUTANEOUS
  Administered 2016-09-14: 3 [IU] via SUBCUTANEOUS
  Administered 2016-09-15 (×3): 2 [IU] via SUBCUTANEOUS
  Administered 2016-09-16: 5 [IU] via SUBCUTANEOUS
  Administered 2016-09-16: 1 [IU] via SUBCUTANEOUS
  Administered 2016-09-17: 7 [IU] via SUBCUTANEOUS
  Administered 2016-09-17: 1 [IU] via SUBCUTANEOUS
  Administered 2016-09-17 – 2016-09-18 (×2): 2 [IU] via SUBCUTANEOUS
  Filled 2016-09-13 (×13): qty 1

## 2016-09-13 MED ORDER — SODIUM CHLORIDE 0.9 % IV SOLN
INTRAVENOUS | Status: DC
  Administered 2016-09-13 – 2016-09-14 (×4): via INTRAVENOUS

## 2016-09-13 MED ORDER — NITROGLYCERIN 2 % TD OINT
0.5000 [in_us] | TOPICAL_OINTMENT | Freq: Four times a day (QID) | TRANSDERMAL | Status: DC
  Administered 2016-09-13 – 2016-09-14 (×4): 0.5 [in_us] via TOPICAL
  Filled 2016-09-13 (×3): qty 1

## 2016-09-13 NOTE — Progress Notes (Signed)
BP is 216/60.  Dr. Manuella Ghazi notified.  Orders to give am metroprolol 25 mg. And Lisinopril 40 mg early.  Will also give prn labetolol.
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
## 2016-09-13 NOTE — H&P (Signed)
Manuel Greenhouse Sr. is an 71 y.o. male.   Chief Complaint: Loss of consciousness HPI: The patient with past medical history of hypertension and diabetes presents to the emergency department after an episode of loss of consciousness. The patient states that he simply tripped and may have blacked out for a few minutes but this episode was witnessed by neighbors and was reportedly a syncopal episode lasting approximately 40 minutes. The patient denies hurting himself in the fall. He also denies chest pain or shortness of breath but is notably lacking in details preceding and following the supposedly syncope. He reportedly has some dementia as well. The patient denies complaints but due to an precipitated syncope the emergency department staff called the hospitalist service for admission.  Past Medical History:  Diagnosis Date  . Anemia   . Diabetes ([place])   . GERD (gastroesophageal reflux disease)   . Hyperlipidemia   . Hypertension   . Leukemia [facility])     Past Surgical History:  Procedure Laterality Date  . CATARACT EXTRACTION W/PHACO Left 09/13/2014   Procedure: CATARACT EXTRACTION PHACO AND INTRAOCULAR LENS PLACEMENT (IOC);  Surgeon: Birder Robson, MD;  Location: [facility] ORS;  Service: Ophthalmology;  Laterality: Left;  [place] 01:03   . COLONOSCOPY  2014  . POPLITEAL ARTERY ANGIOPLASTY Left 2014  . PTCA Right    leg  . UPPER GASTROINTESTINAL ENDOSCOPY  2014    Family History  Problem Relation Age of Onset  . Diabetes Mother   . Diabetes Father    Social History:  reports that he has been smoking Cigarettes.  He has been smoking about 0.50 packs per day. He has never used smokeless tobacco. He reports that he does not drink alcohol or use drugs.  Allergies: No Known Allergies  Prior to Admission medications   Medication Sig Start Date End Date Taking? Authorizing Provider  aspirin 81 MG tablet Take 1 tablet (81 mg total) by mouth daily. 08/14/16  Yes [name], MD  gabapentin  (NEURONTIN) 300 MG capsule TAKE 1 CAPSULE BY MOUTH 3  TIMES DAILY 08/30/16  Yes [name], MD  lisinopril (PRINIVIL,ZESTRIL) 40 MG tablet Take 1 tablet (40 mg total) by mouth 2 (two) times daily. 10/24/15  Yes [name], MD  metFORMIN (GLUCOPHAGE) 500 MG tablet Take 1 tablet (500 mg total) by mouth 2 (two) times daily with a meal. 04/16/16 04/16/17 Yes [name], MD  metoprolol tartrate (LOPRESSOR) 25 MG tablet Take 1 tablet (25 mg total) by mouth 2 (two) times daily. 03/18/16 03/18/17 Yes Schaevitz, Randall An, MD  mupirocin ointment Drue Stager) 2 % Apply to affected area 3 times daily 08/14/16 08/14/17 Yes [name], MD  simvastatin (ZOCOR) 20 MG tablet TAKE 1 TABLET BY MOUTH AT  BEDTIME 08/30/16  Yes [name], MD  triamcinolone cream (KENALOG) 0.1 % APPLY TO AFFECTED AREA TWICE A DAY 08/07/16  Yes Daleen Bo, MD  glucose blood test strip Use as instructed 03/06/15   [name], MD  Lancets 28G MISC 1 each by Does not apply route daily. 03/06/15   [name], MD     Results for orders placed or performed during the hospital encounter of 09/12/16 (from the past 48 hour(s))  CBC     Status: Abnormal   Collection Time: 09/12/16 12:06 AM  Result Value Ref Range   WBC 9.3 3.8 - 10.6 K/uL   RBC 4.32 (L) 4.40 - 5.90 MIL/uL   Hemoglobin 12.6 (L) 13.0 -  18.0 g/dL   HCT 38.5 (L) 40.0 - 52.0 %   MCV 89.1 80.0 - 100.0 fL   MCH 29.2 26.0 - 34.0 pg   MCHC 32.8 32.0 - 36.0 g/dL   RDW 14.7 (H) 11.5 - 14.5 %   Platelets 175 150 - 440 K/uL  Comprehensive metabolic panel     Status: Abnormal   Collection Time: 09/12/16 10:41 PM  Result Value Ref Range   Sodium 140 135 - 145 mmol/L   Potassium 4.2 3.5 - 5.1 mmol/L   Chloride 105 101 - 111 mmol/L   CO2 26 22 - 32 mmol/L   Glucose, Bld 101 (H) 65 - 99 mg/dL   BUN 35 (H) 6 - 20 mg/dL   Creatinine, Ser 2.09 (H) 0.61 - 1.24 mg/dL   Calcium 9.8 8.9 - 10.3 mg/dL   Total Protein 8.2 (H) 6.5 - 8.1 g/dL    Albumin 4.0 3.5 - 5.0 g/dL   AST 15 15 - 41 U/L   ALT 9 (L) 17 - 63 U/L   Alkaline Phosphatase 94 38 - 126 U/L   Total Bilirubin 0.7 0.3 - 1.2 mg/dL   GFR calc non Af Amer 31 (L) >60 mL/min   GFR calc Af Amer 36 (L) >60 mL/min    Comment: (NOTE) The eGFR has been calculated using the CKD EPI equation. This calculation has not been validated in all clinical situations. eGFR's persistently <60 mL/min signify possible Chronic Kidney Disease.    Anion gap 9 5 - 15  Troponin I     Status: None   Collection Time: 09/12/16 10:41 PM  Result Value Ref Range   Troponin I <0.03 <0.03 ng/mL  Lactic acid, plasma     Status: None   Collection Time: 09/12/16 10:41 PM  Result Value Ref Range   Lactic Acid, Venous 0.8 0.5 - 1.9 mmol/L  CK     Status: None   Collection Time: 09/12/16 10:41 PM  Result Value Ref Range   Total CK 122 49 - 397 U/L   Ct Head Wo Contrast  Result Date: 09/12/2016 CLINICAL DATA:  Status post fall, with loss of consciousness. Initial encounter. EXAM: CT HEAD WITHOUT CONTRAST TECHNIQUE: Contiguous axial images were obtained from the base of the skull through the vertex without intravenous contrast. COMPARISON:  None. FINDINGS: Brain: No evidence of acute infarction, hemorrhage, hydrocephalus, extra-axial collection or mass lesion/mass effect. Prominence of the ventricles and sulci reflects mild to moderate cortical volume loss. Cerebellar atrophy is noted. Scattered periventricular and subcortical white matter change likely reflects small vessel ischemic microangiopathy. Chronic lacunar infarcts are seen at the basal ganglia and thalami bilaterally. The brainstem and fourth ventricle are within normal limits. The cerebral hemispheres demonstrate grossly normal gray-white differentiation. No mass effect or midline shift is seen. Vascular: No hyperdense vessel or unexpected calcification. Skull: There is no evidence of fracture; visualized osseous structures are unremarkable in  appearance. Sinuses/Orbits: The visualized portions of the orbits are within normal limits. There is mild partial opacification of the left mastoid air cells. The paranasal sinuses and right mastoid air cells are well-aerated. Other: No significant soft tissue abnormalities are seen. IMPRESSION: 1. No evidence of traumatic intracranial injury or fracture. 2. Mild to moderate cortical volume loss scattered small vessel ischemic microangiopathy. 3. Chronic lacunar infarcts at the basal ganglia and thalami bilaterally. 4. Mild partial opacification of the left mastoid air cells. Electronically Signed   By: Garald Balding M.D.   On: 09/12/2016 22:35  Review of Systems  Constitutional: Negative for chills and fever.  HENT: Negative for sore throat and tinnitus.   Eyes: Negative for blurred vision and redness.  Respiratory: Negative for cough and shortness of breath.   Cardiovascular: Negative for chest pain, palpitations, orthopnea and PND.  Gastrointestinal: Negative for abdominal pain, diarrhea, nausea and vomiting.  Genitourinary: Negative for dysuria, frequency and urgency.  Musculoskeletal: Positive for falls. Negative for joint pain and myalgias.  Skin: Negative for rash.       No lesions  Neurological: Positive for loss of consciousness. Negative for speech change, focal weakness and weakness.  Endo/Heme/Allergies: Does not bruise/bleed easily.       No temperature intolerance  Psychiatric/Behavioral: Negative for depression and suicidal ideas.    Blood pressure (!) 193/72, pulse 69, temperature 98.5 F (36.9 C), temperature source Oral, resp. rate 13, height '5\' 8"'  (1.727 m), weight 64.4 kg (142 lb), SpO2 98 %. Physical Exam  Nursing note and vitals reviewed. Constitutional: He is oriented to person, place, and time. He appears well-developed and well-nourished. No distress.  HENT:  Head: Normocephalic and atraumatic.  Mouth/Throat: Oropharynx is clear and moist.  Eyes: Conjunctivae  and EOM are normal. Pupils are equal, round, and reactive to light. No scleral icterus.  Neck: Normal range of motion. Neck supple. No JVD present. No tracheal deviation present. No thyromegaly present.  Cardiovascular: Normal rate, regular rhythm and normal heart sounds.  Exam reveals no gallop and no friction rub.   No murmur heard. Respiratory: Effort normal and breath sounds normal. No respiratory distress.  GI: Soft. Bowel sounds are normal. He exhibits no distension. There is no tenderness.  Genitourinary:  Genitourinary Comments: Deferred  Musculoskeletal: Normal range of motion. He exhibits no edema.  Lymphadenopathy:    He has no cervical adenopathy.  Neurological: He is alert and oriented to person, place, and time. No cranial nerve deficit.  Skin: Skin is warm and dry. No rash noted. No erythema.  Psychiatric: He has a normal mood and affect. His behavior is normal. Judgment and thought content normal.     Assessment/Plan This is a 71 year old male admitted for syncope. 1. Syncope: Rule out cardiac etiologies. Echocardiogram ordered. Could be neurogenic. Patient has an odd rash on his hands for which she was initially prescribed triamcinolone by his outpatient physician. Due to questionable new onset dementia as well as possible neurologic findings must rule out syphilis. RPR pending. 2. Acute kidney injury: Mild; may be secondary to dehydration which is a possible etiology of syncope. Hydrate with intravenous fluid. Avoid nephrotoxic agents. 3. Essential hypertension: Uncontrolled; continue metoprolol and lisinopril. I have applied Nitropaste the patient's chest. Labetalol as needed. 4. Diabetes mellitus type 2: Hold oral hypoglycemic medication. Sliding scale insulin while hospitalized. 5. Hyperlipidemia: Continue statin therapy 6. DVT prophylaxis: Heparin 7. GI prophylaxis: None The patient is a full code. Time spent on admission orders and patient care approximately 45  minutes  Harrie Foreman, MD 09/13/2016, 3:23 AM

## 2016-09-13 NOTE — Progress Notes (Signed)
He is oriented to person and place only.  He has forgotten that he ate lunch and wants to know where it is.  He reports he has no9 family in the area.  His daughter was here this morning.

## 2016-09-13 NOTE — ED Notes (Signed)
Pt changed into clean depends and socks. New blankets given.

## 2016-09-13 NOTE — Care Management Obs Status (Signed)
[place] NOTIFICATION   Patient Details  Name: Manuel DAVID Sr. MRN: [id number] Date of Birth: October 19, 1945   Medicare Observation Status Notification Given:  Yes    Marshell Garfinkel, RN 09/13/2016, 8:54 AM

## 2016-09-13 NOTE — ED Notes (Signed)
RN called lab in regards to delayed lab results. Lab reports they have the tubes and will result shortly.

## 2016-09-13 NOTE — Progress Notes (Signed)
PT Hold Note  Patient Details Name: Manuel PARLATO Sr. MRN: [id number] DOB: 03-Mar-1946   Hold Treatment:    Reason Eval/Treat Not Completed: Medical issues which prohibited therapy. Chart reviewed. BP has been elevated this AM and most recent reading at 1004 is 212/90. Pt currently contraindicated for PT evaluation. Will attempt PT evaluation on later time/date as pt is appropriate.   Lyndel Safe Nandita Mathenia PT, DPT   Cordai Rodrigue 09/13/2016, 10:22 AM

## 2016-09-13 NOTE — Progress Notes (Signed)
*  PRELIMINARY RESULTS* Echocardiogram 2D Echocardiogram has been performed.  Manuel Randolph 09/13/2016, 9:03 PM

## 2016-09-13 NOTE — Care Management (Signed)
Placed in observation for syncope with loss of consciousness.  Lives alone. Daughter Azrael Maddix who lives about 20 minutes from patient, and her brother (who lives in [place]) Provide meals for patient that he can he can heat up in the microwave but patient "forgets to eat."  he does not take a bath anymore, cognitive decline over the last year. Patient's son took patient to live with him for about two weeks "but it was too much."  Family is in the process of applying for medicaid for patient.  Patient receives approx 1100 dollars a month in social security.  does not have any ambulatory devices that can be located.  Family is investigating assisted living for patient.  Discussed observation status and medicare criteria in regards to skilled nursing. Family is aware medicare does not pay for assisted living.  Discussed home health nurse, physical therapy, aide and social work.  Discussed long range plan that would best meet patient's needs would be facility placement but most likely will not be able to discharge from hospital to a facility. Obtained order for PT and OT consults.  Tinisha requests a completed FL2 that she can give to DSS as a part of medicaid application for placement.  Will relay to [place] and informed her that documented would expire in 30 days.  Home health preference is [facility]. Heads up referral to [facility].  [name] asks if patient is discharged home with home health, for agency to call her to set up home visits so she can be present.

## 2016-09-14 DIAGNOSIS — R55 Syncope and collapse: Secondary | ICD-10-CM | POA: Diagnosis not present

## 2016-09-14 DIAGNOSIS — N179 Acute kidney failure, unspecified: Secondary | ICD-10-CM | POA: Diagnosis not present

## 2016-09-14 DIAGNOSIS — E119 Type 2 diabetes mellitus without complications: Secondary | ICD-10-CM | POA: Diagnosis not present

## 2016-09-14 DIAGNOSIS — R4182 Altered mental status, unspecified: Secondary | ICD-10-CM | POA: Diagnosis not present

## 2016-09-14 LAB — BASIC METABOLIC PANEL
Anion gap: 7 (ref 5–15)
BUN: 24 mg/dL — ABNORMAL HIGH (ref 6–20)
CO2: 26 mmol/L (ref 22–32)
Calcium: 9.6 mg/dL (ref 8.9–10.3)
Chloride: 107 mmol/L (ref 101–111)
Creatinine, Ser: 1.58 mg/dL — ABNORMAL HIGH (ref 0.61–1.24)
GFR calc Af Amer: 50 mL/min — ABNORMAL LOW (ref >60)
GFR calc non Af Amer: 43 mL/min — ABNORMAL LOW (ref >60)
Glucose, Bld: 209 mg/dL — ABNORMAL HIGH (ref 65–99)
Potassium: 4 mmol/L (ref 3.5–5.1)
Sodium: 140 mmol/L (ref 135–145)

## 2016-09-14 LAB — ECHOCARDIOGRAM COMPLETE
Height: 68 in
Weight: 2084.8 oz

## 2016-09-14 LAB — GLUCOSE, CAPILLARY
Glucose-Capillary: 106 mg/dL — ABNORMAL HIGH (ref 65–99)
Glucose-Capillary: 213 mg/dL — ABNORMAL HIGH (ref 65–99)
Glucose-Capillary: 122 mg/dL — ABNORMAL HIGH (ref 65–99)
Glucose-Capillary: 190 mg/dL — ABNORMAL HIGH (ref 65–99)

## 2016-09-14 LAB — RPR: RPR Ser Ql: NONREACTIVE

## 2016-09-14 MED ORDER — NICOTINE 21 MG/24HR TD PT24
21.0000 mg | MEDICATED_PATCH | Freq: Every day | TRANSDERMAL | Status: DC
  Administered 2016-09-14 – 2016-09-18 (×5): 21 mg via TRANSDERMAL
  Filled 2016-09-14 (×5): qty 1

## 2016-09-14 MED ORDER — HYDRALAZINE HCL 50 MG PO TABS
50.0000 mg | ORAL_TABLET | Freq: Three times a day (TID) | ORAL | Status: DC
  Administered 2016-09-14 – 2016-09-18 (×10): 50 mg via ORAL
  Filled 2016-09-14 (×11): qty 1

## 2016-09-14 NOTE — Progress Notes (Signed)
Patient's daughter says that he is much more confused than he has been at home.  His forgetfulness/ memory loss is typical for him.

## 2016-09-14 NOTE — Evaluation (Signed)
Physical Therapy Evaluation Patient Details Name: Manuel ROSSA Sr. MRN: [id number] DOB: 12-16-1945 Today's Date: 09/14/2016   History of Present Illness  Pt admitted secondary to complaints of syncope with LOC. History includes HTN, DM, and GERD. Per patient, has had recent falls.  Clinical Impression  Pt is a pleasant 71 year old male who was admitted for syncope with LOC from fall. Pt performs bed mobility, transfers, and ambulation with min assist without AD. Slight unsteadiness noted and pt fatigues quickly, unable for further ambulate in room at this time. Pt demonstrates deficits with strength/endurance/mobility. Recommend continued use of AD for improved balance/safety/decreased falls risk. Would benefit from skilled PT to address above deficits and promote optimal return to PLOF; recommend transition to STR upon discharge from acute hospitalization.       Follow Up Recommendations SNF    Equipment Recommendations  Rolling walker with 5" wheels    Recommendations for Other Services       Precautions / Restrictions Precautions Precautions: Fall Restrictions Weight Bearing Restrictions: No      Mobility  Bed Mobility Overal bed mobility: Needs Assistance Bed Mobility: Supine to Sit     Supine to sit: Min assist     General bed mobility comments: assist for transitioning to EOB. Pt able to sit at EOB without LOB noted  Transfers Overall transfer level: Needs assistance Equipment used: None Transfers: Sit to/from Stand Sit to Stand: Min assist         General transfer comment: needs assist for standing at bedside with slight unsteadiness noted. HHA given  Ambulation/Gait Ambulation/Gait assistance: Min assist Ambulation Distance (Feet): 5 Feet Assistive device: None Gait Pattern/deviations: Step-to pattern     General Gait Details: ambulated to recliner without AD with HHA. Pt reports he feels too weak and fatigue to attempt further ambulation and  would like to sit in recliner for breakfast.  Stairs            Wheelchair Mobility    Modified Rankin (Stroke Patients Only)       Balance Overall balance assessment: Needs assistance;History of Falls Sitting-balance support: Feet supported Sitting balance-Leahy Scale: Good     Standing balance support: No upper extremity supported Standing balance-Leahy Scale: Fair                               Pertinent Vitals/Pain Pain Assessment: No/denies pain    Home Living Family/patient expects to be discharged to:: Private residence Living Arrangements: Alone   Type of Home: House Home Access: Level entry     Home Layout: One level Home Equipment: None      Prior Function Level of Independence: Independent         Comments: reports he is unable to take care of himself and prefers not to live alone     Hand Dominance        Extremity/Trunk Assessment   Upper Extremity Assessment Upper Extremity Assessment: Overall WFL for tasks assessed    Lower Extremity Assessment Lower Extremity Assessment: Generalized weakness (B LE grossly 3+/5)       Communication   Communication: No difficulties  Cognition Arousal/Alertness: Awake/alert Behavior During Therapy: Flat affect Overall Cognitive Status: Impaired/Different from baseline                                 General Comments: demonstrates lack of awareness  and appears confused      General Comments      Exercises     Assessment/Plan    PT Assessment Patient needs continued PT services  PT Problem List Decreased strength;Decreased activity tolerance;Decreased balance;Decreased mobility;Decreased safety awareness;Decreased cognition       PT Treatment Interventions Gait training;DME instruction;Therapeutic exercise;Balance training    PT Goals (Current goals can be found in the Care Plan section)  Acute Rehab PT Goals Patient Stated Goal: to have more  assistance PT Goal Formulation: With patient Time For Goal Achievement: 09/28/16 Potential to Achieve Goals: Good    Frequency Min 2X/week   Barriers to discharge Decreased caregiver support      Co-evaluation               AM-PAC PT "6 Clicks" Daily Activity  Outcome Measure Difficulty turning over in bed (including adjusting bedclothes, sheets and blankets)?: A Little Difficulty moving from lying on back to sitting on the side of the bed? : Total Difficulty sitting down on and standing up from a chair with arms (e.g., wheelchair, bedside commode, etc,.)?: Total Help needed moving to and from a bed to chair (including a wheelchair)?: A Little Help needed walking in hospital room?: A Little Help needed climbing 3-5 steps with a railing? : A Lot 6 Click Score: 13    End of Session Equipment Utilized During Treatment: Gait belt Activity Tolerance: Patient limited by fatigue Patient left: in chair;with chair alarm set Nurse Communication: Mobility status PT Visit Diagnosis: Unsteadiness on feet (R26.81);History of falling (Z91.81);Muscle weakness (generalized) (M62.81)    Time: 8588-5027 PT Time Calculation (min) (ACUTE ONLY): 12 min   Charges:   PT Evaluation $PT Eval Moderate Complexity: 1 Procedure     PT G Codes:   PT G-Codes **NOT FOR INPATIENT CLASS** Functional Assessment Tool Used: AM-PAC 6 Clicks Basic Mobility Functional Limitation: Mobility: Walking and moving around Mobility: Walking and Moving Around Current Status ([id number]): At least 40 percent but less than 60 percent impaired, limited or restricted Mobility: Walking and Moving Around Goal Status [phone]): At least 20 percent but less than 40 percent impaired, limited or restricted    Greggory Stallion, PT, DPT [phone]   Manuel Randolph 09/14/2016, 10:55 AM

## 2016-09-14 NOTE — Progress Notes (Signed)
[facility] [place] NAME: Manuel Randolph    MR#:  [id number]  DATE OF BIRTH:  July 03, 1945  SUBJECTIVE:   Pt. Here due to Syncope and altered mental status.  Remains confused.  No further episodes of syncope.  Seen by PT and they recommend SNF/STR  REVIEW OF SYSTEMS:    Review of Systems  Unable to perform ROS: Mental acuity    Nutrition: Heart Healthy/Carb control Tolerating Diet: Yes Tolerating PT: Eval noted.    DRUG ALLERGIES:  No Known Allergies  VITALS:  Blood pressure (!) 147/52, pulse (!) 55, temperature 97.8 F (36.6 C), temperature source Oral, resp. rate 18, height 5\' 8"  (1.727 m), weight 59.1 kg (130 lb 4.8 oz), SpO2 99 %.  PHYSICAL EXAMINATION:   Physical Exam  GENERAL:  71 y.o.-year-old patient lying in bed in no acute distress.  EYES: Pupils equal, round, reactive to light and accommodation. No scleral icterus. Extraocular muscles intact.  HEENT: Head atraumatic, normocephalic. Oropharynx and nasopharynx clear.  NECK:  Supple, no jugular venous distention. No thyroid enlargement, no tenderness.  LUNGS: Normal breath sounds bilaterally, no wheezing, rales, rhonchi. No use of accessory muscles of respiration.  CARDIOVASCULAR: S1, S2 normal. No murmurs, rubs, or gallops.  ABDOMEN: Soft, nontender, nondistended. Bowel sounds present. No organomegaly or mass.  EXTREMITIES: No cyanosis, clubbing or edema b/l.    NEUROLOGIC: Cranial nerves II through XII are intact. No focal Motor or sensory deficits b/l. Globally weak  PSYCHIATRIC: The patient is alert and oriented x 1. SKIN: No obvious rash, lesion, or ulcer.    LABORATORY PANEL:   CBC  Recent Labs Lab 09/12/16 0006  WBC 9.3  HGB 12.6*  HCT 38.5*  PLT 175   ------------------------------------------------------------------------------------------------------------------  Chemistries   Recent Labs Lab 09/12/16 2241 09/14/16 1039  NA 140 140  K 4.2 4.0   CL 105 107  CO2 26 26  GLUCOSE 101* 209*  BUN 35* 24*  CREATININE 2.09* 1.58*  CALCIUM 9.8 9.6  AST 15  --   ALT 9*  --   ALKPHOS 94  --   BILITOT 0.7  --    ------------------------------------------------------------------------------------------------------------------  Cardiac Enzymes  Recent Labs Lab 09/12/16 2241  TROPONINI <0.03   ------------------------------------------------------------------------------------------------------------------  RADIOLOGY:  Ct Head Wo Contrast  Result Date: 09/12/2016 CLINICAL DATA:  Status post fall, with loss of consciousness. Initial encounter. EXAM: CT HEAD WITHOUT CONTRAST TECHNIQUE: Contiguous axial images were obtained from the base of the skull through the vertex without intravenous contrast. COMPARISON:  None. FINDINGS: Brain: No evidence of acute infarction, hemorrhage, hydrocephalus, extra-axial collection or mass lesion/mass effect. Prominence of the ventricles and sulci reflects mild to moderate cortical volume loss. Cerebellar atrophy is noted. Scattered periventricular and subcortical white matter change likely reflects small vessel ischemic microangiopathy. Chronic lacunar infarcts are seen at the basal ganglia and thalami bilaterally. The brainstem and fourth ventricle are within normal limits. The cerebral hemispheres demonstrate grossly normal gray-white differentiation. No mass effect or midline shift is seen. Vascular: No hyperdense vessel or unexpected calcification. Skull: There is no evidence of fracture; visualized osseous structures are unremarkable in appearance. Sinuses/Orbits: The visualized portions of the orbits are within normal limits. There is mild partial opacification of the left mastoid air cells. The paranasal sinuses and right mastoid air cells are well-aerated. Other: No significant soft tissue abnormalities are seen. IMPRESSION: 1. No evidence of traumatic intracranial injury or fracture. 2. Mild to moderate  cortical volume loss scattered  small vessel ischemic microangiopathy. 3. Chronic lacunar infarcts at the basal ganglia and thalami bilaterally. 4. Mild partial opacification of the left mastoid air cells. Electronically Signed   By: Garald Balding M.D.   On: 09/12/2016 22:35     ASSESSMENT AND PLAN:   71 year old male with past medical history of essential hypertension, hyperlipidemia, GERD, diabetes, chronic anemia, [name] who presented to the hospital due to syncope.  1. Syncope-etiology unclear presently. No further episodes while in the hospital. -Noted to have some episodes of bradycardia and therefore DC metoprolol. No pauses noted. -Two-dimensional echocardiogram showing normal ejection fraction, we'll check orthostatic vital signs. -CT head negative for acute pathology.  2. Acute on chronic renal failure-baseline creatinine around 1.4. Presented to the hospital with a creatinine of 2.0. -Improved with IV fluids and will continue to monitor. Renal dose meds, avoid nephrotoxic agents.  3. Essential hypertension-slightly uncontrolled. Given the bradycardia I will stop the metoprolol.  -continue lisinopril, increase hydralazine and follow BP.   4. DM Type II - cont. SSI and follow BS which are stable so far.   5. Hyperlipidemia - cont. Simvastatin  6. Tobacco abuse - place on nicotine patch.   Seen by PT and they recommend SNF/STR but pt. Is under observation and will discuss w/ social work.      All the records are reviewed and case discussed with Care Management/Social Worker. Management plans discussed with the patient, family and they are in agreement.  CODE STATUS: Full code  DVT Prophylaxis: Hep. SQ  TOTAL TIME TAKING CARE OF THIS PATIENT: 30 minutes.   POSSIBLE D/C IN 1-2 DAYS, DEPENDING ON CLINICAL CONDITION.   Henreitta Leber M.D on 09/14/2016 at 3:50 PM  Between 7am to 6pm - Pager - [phone]  After 6pm go to [email or web address] - Proofreader  [facility]  Office  [phone]  CC: Primary care physician; [name], MD

## 2016-09-14 NOTE — Clinical Social Work Note (Signed)
CSW received consult for SNF placement. CSW will follow pending PT recommendations.  Derriana Oser Martha Conya Ellinwood, MSW, LCSWA [phone] 

## 2016-09-14 NOTE — Evaluation (Signed)
Occupational Therapy Evaluation Patient Details Name: Manuel STILLINGS Sr. MRN: [id number] DOB: 09-07-1945 Today's Date: 09/14/2016    History of Present Illness Pt. is a 71 y.o. male who was admitted to [facility] with Syncope. Pt. PMHx includes: HTN, DM, and GERD.   Clinical Impression   Pt. Is a 71 y.o. Male who was admitted to [facility] with Syncope. Pt.'s HR 138-145 at rest. Pt. Education was provided about energy conservation, and work simplification techniques. Pt. presents with weakness, impaired activity tolerance, impaired cognition, and impaired functional mobility which hinder his ability to complete ADL, and IADL functioning. Pt. could benefit from SNF level of care upon discharge with follow-up OT services.    Follow Up Recommendations  SNF    Equipment Recommendations       Recommendations for Other Services       Precautions / Restrictions Precautions Precautions: Fall Restrictions Weight Bearing Restrictions: No             ADL either performed or assessed with clinical judgement   ADL Overall ADL's : Needs assistance/impaired Eating/Feeding: Set up;Bed level   Grooming: Minimal assistance                                 General ADL Comments: Pt. HR 138-145 at rest. Pt. education was provided about about energy conservation/work simplification      Vision Patient Visual Report: No change from baseline       Perception     Praxis      Pertinent Vitals/Pain Pain Assessment: No/denies pain     Hand Dominance Right   Extremity/Trunk Assessment Upper Extremity Assessment Upper Extremity Assessment: Overall WFL for tasks assessed         Communication Communication Communication: No difficulties   Cognition Arousal/Alertness: Awake/alert Behavior During Therapy: WFL for tasks assessed/performed Overall Cognitive Status: Impaired/Different from baseline Area of Impairment: Safety/judgement                                General Comments: demonstrates lack of awareness and appears confused   General Comments       Exercises     Shoulder Instructions      Home Living Family/patient expects to be discharged to:: Private residence Living Arrangements: Alone   Type of Home: House Home Access: Level entry     Home Layout: One level     Bathroom Shower/Tub: Chief Strategy Officer: None          Prior Functioning/Environment Level of Independence: Independent        Comments: reports he is unable to take care of himself and prefers not to live alone        OT Problem List: Decreased strength;Decreased activity tolerance;Decreased safety awareness      OT Treatment/Interventions: Self-care/ADL training;Therapeutic exercise;Patient/family education;Visual/perceptual remediation/compensation;Energy conservation;Therapeutic activities    OT Goals(Current goals can be found in the care plan section) Acute Rehab OT Goals Patient Stated Goal: To return home OT Goal Formulation: With patient Potential to Achieve Goals: Fair  OT Frequency: Min 1X/week   Barriers to D/C:            Co-evaluation              AM-PAC PT "6 Clicks" Daily Activity     Outcome Measure Help from another person  eating meals?: A Little Help from another person taking care of personal grooming?: A Little Help from another person toileting, which includes using toliet, bedpan, or urinal?: A Lot Help from another person bathing (including washing, rinsing, drying)?: A Lot Help from another person to put on and taking off regular upper body clothing?: A Little Help from another person to put on and taking off regular lower body clothing?: A Lot 6 Click Score: 15   End of Session    Activity Tolerance: Patient tolerated treatment well Patient left: in bed;with call bell/phone within reach;with bed alarm set  OT Visit Diagnosis: Repeated falls (R29.6)                Time:  1115-1130 OT Time Calculation (min): 15 min Charges:    G-Codes: OT G-codes **NOT FOR INPATIENT CLASS** Functional Limitation: Self care Self Care Current Status (T9539): At least 20 percent but less than 40 percent impaired, limited or restricted Self Care Goal Status (Y7289): At least 1 percent but less than 20 percent impaired, limited or restricted   Harrel Carina, MS, OTR/L   Harrel Carina, MS, OTR/L 09/14/2016, 1:47 PM

## 2016-09-15 DIAGNOSIS — R2681 Unsteadiness on feet: Secondary | ICD-10-CM | POA: Diagnosis not present

## 2016-09-15 DIAGNOSIS — I129 Hypertensive chronic kidney disease with stage 1 through stage 4 chronic kidney disease, or unspecified chronic kidney disease: Secondary | ICD-10-CM | POA: Diagnosis present

## 2016-09-15 DIAGNOSIS — F0151 Vascular dementia with behavioral disturbance: Secondary | ICD-10-CM | POA: Diagnosis not present

## 2016-09-15 DIAGNOSIS — Z79899 Other long term (current) drug therapy: Secondary | ICD-10-CM | POA: Diagnosis not present

## 2016-09-15 DIAGNOSIS — Z7982 Long term (current) use of aspirin: Secondary | ICD-10-CM | POA: Diagnosis not present

## 2016-09-15 DIAGNOSIS — D649 Anemia, unspecified: Secondary | ICD-10-CM | POA: Diagnosis present

## 2016-09-15 DIAGNOSIS — I10 Essential (primary) hypertension: Secondary | ICD-10-CM | POA: Diagnosis not present

## 2016-09-15 DIAGNOSIS — R21 Rash and other nonspecific skin eruption: Secondary | ICD-10-CM | POA: Diagnosis present

## 2016-09-15 DIAGNOSIS — C9111 Chronic lymphocytic leukemia of B-cell type in remission: Secondary | ICD-10-CM | POA: Diagnosis not present

## 2016-09-15 DIAGNOSIS — E1121 Type 2 diabetes mellitus with diabetic nephropathy: Secondary | ICD-10-CM | POA: Diagnosis not present

## 2016-09-15 DIAGNOSIS — C9110 Chronic lymphocytic leukemia of B-cell type not having achieved remission: Secondary | ICD-10-CM | POA: Diagnosis present

## 2016-09-15 DIAGNOSIS — E119 Type 2 diabetes mellitus without complications: Secondary | ICD-10-CM | POA: Diagnosis not present

## 2016-09-15 DIAGNOSIS — F17210 Nicotine dependence, cigarettes, uncomplicated: Secondary | ICD-10-CM | POA: Diagnosis present

## 2016-09-15 DIAGNOSIS — F05 Delirium due to known physiological condition: Secondary | ICD-10-CM | POA: Diagnosis not present

## 2016-09-15 DIAGNOSIS — R55 Syncope and collapse: Secondary | ICD-10-CM | POA: Diagnosis present

## 2016-09-15 DIAGNOSIS — N183 Chronic kidney disease, stage 3 (moderate): Secondary | ICD-10-CM | POA: Diagnosis present

## 2016-09-15 DIAGNOSIS — E785 Hyperlipidemia, unspecified: Secondary | ICD-10-CM | POA: Diagnosis present

## 2016-09-15 DIAGNOSIS — E0821 Diabetes mellitus due to underlying condition with diabetic nephropathy: Secondary | ICD-10-CM | POA: Diagnosis not present

## 2016-09-15 DIAGNOSIS — E784 Other hyperlipidemia: Secondary | ICD-10-CM | POA: Diagnosis not present

## 2016-09-15 DIAGNOSIS — N179 Acute kidney failure, unspecified: Secondary | ICD-10-CM | POA: Diagnosis present

## 2016-09-15 DIAGNOSIS — Z7984 Long term (current) use of oral hypoglycemic drugs: Secondary | ICD-10-CM | POA: Diagnosis not present

## 2016-09-15 DIAGNOSIS — M6281 Muscle weakness (generalized): Secondary | ICD-10-CM | POA: Diagnosis not present

## 2016-09-15 DIAGNOSIS — R4182 Altered mental status, unspecified: Secondary | ICD-10-CM | POA: Diagnosis not present

## 2016-09-15 DIAGNOSIS — Z5189 Encounter for other specified aftercare: Secondary | ICD-10-CM | POA: Diagnosis not present

## 2016-09-15 DIAGNOSIS — N2889 Other specified disorders of kidney and ureter: Secondary | ICD-10-CM | POA: Diagnosis not present

## 2016-09-15 DIAGNOSIS — F0390 Unspecified dementia without behavioral disturbance: Secondary | ICD-10-CM | POA: Diagnosis present

## 2016-09-15 DIAGNOSIS — E1151 Type 2 diabetes mellitus with diabetic peripheral angiopathy without gangrene: Secondary | ICD-10-CM | POA: Diagnosis present

## 2016-09-15 DIAGNOSIS — E1122 Type 2 diabetes mellitus with diabetic chronic kidney disease: Secondary | ICD-10-CM | POA: Diagnosis present

## 2016-09-15 DIAGNOSIS — E860 Dehydration: Secondary | ICD-10-CM | POA: Diagnosis present

## 2016-09-15 DIAGNOSIS — K219 Gastro-esophageal reflux disease without esophagitis: Secondary | ICD-10-CM | POA: Diagnosis present

## 2016-09-15 LAB — GLUCOSE, CAPILLARY
Glucose-Capillary: 164 mg/dL — ABNORMAL HIGH (ref 65–99)
Glucose-Capillary: 164 mg/dL — ABNORMAL HIGH (ref 65–99)
Glucose-Capillary: 151 mg/dL — ABNORMAL HIGH (ref 65–99)
Glucose-Capillary: 152 mg/dL — ABNORMAL HIGH (ref 65–99)

## 2016-09-15 MED ORDER — HALOPERIDOL LACTATE 5 MG/ML IJ SOLN
2.0000 mg | Freq: Once | INTRAMUSCULAR | Status: AC
  Administered 2016-09-15: 2 mg via INTRAVENOUS
  Filled 2016-09-15: qty 1

## 2016-09-15 NOTE — Progress Notes (Signed)
Patient has been resting comfortably after the Haldol medication, respirations even and unlabored. Refused blood drawn, oral medication and vital signs check. Will continue to monitor.

## 2016-09-15 NOTE — Progress Notes (Signed)
[facility] [place] NAME: Manuel Randolph    MR#:  [id number]  DATE OF BIRTH:  Aug 14, 1945  SUBJECTIVE:   Pt. Here due to Syncope and altered mental status.  Got agitated last night and got some Haldol overnight. Still having some mild bradycardia but asymptomatic. No other acute complaints or events overnight.  REVIEW OF SYSTEMS:    Review of Systems  Unable to perform ROS: Mental acuity    Nutrition: Heart Healthy/Carb control Tolerating Diet: Yes Tolerating PT: Eval noted.    DRUG ALLERGIES:  No Known Allergies  VITALS:  Blood pressure (!) 164/75, pulse (!) 51, temperature 97.7 F (36.5 C), temperature source Oral, resp. rate 18, height 5\' 8"  (1.727 m), weight 63.5 kg (140 lb), SpO2 99 %.  PHYSICAL EXAMINATION:   Physical Exam  GENERAL:  71 y.o.-year-old patient lying in bed in no acute distress.  EYES: Pupils equal, round, reactive to light and accommodation. No scleral icterus. Extraocular muscles intact.  HEENT: Head atraumatic, normocephalic. Oropharynx and nasopharynx clear.  NECK:  Supple, no jugular venous distention. No thyroid enlargement, no tenderness.  LUNGS: Normal breath sounds bilaterally, no wheezing, rales, rhonchi. No use of accessory muscles of respiration.  CARDIOVASCULAR: S1, S2 normal. No murmurs, rubs, or gallops.  ABDOMEN: Soft, nontender, nondistended. Bowel sounds present. No organomegaly or mass.  EXTREMITIES: No cyanosis, clubbing or edema b/l.    NEUROLOGIC: Cranial nerves II through XII are intact. No focal Motor or sensory deficits b/l. Globally weak  PSYCHIATRIC: The patient is alert and oriented x 1. SKIN: No obvious rash, lesion, or ulcer.    LABORATORY PANEL:   CBC  Recent Labs Lab 09/12/16 0006  WBC 9.3  HGB 12.6*  HCT 38.5*  PLT 175   ------------------------------------------------------------------------------------------------------------------  Chemistries   Recent  Labs Lab 09/12/16 2241 09/14/16 1039  NA 140 140  K 4.2 4.0  CL 105 107  CO2 26 26  GLUCOSE 101* 209*  BUN 35* 24*  CREATININE 2.09* 1.58*  CALCIUM 9.8 9.6  AST 15  --   ALT 9*  --   ALKPHOS 94  --   BILITOT 0.7  --    ------------------------------------------------------------------------------------------------------------------  Cardiac Enzymes  Recent Labs Lab 09/12/16 2241  TROPONINI <0.03   ------------------------------------------------------------------------------------------------------------------  RADIOLOGY:  No results found.   ASSESSMENT AND PLAN:   71 year old male with past medical history of essential hypertension, hyperlipidemia, GERD, diabetes, chronic anemia, [name] who presented to the hospital due to syncope.  1. Syncope-etiology unclear presently. No further episodes while in the hospital. - Continues to have some bradycardia but no pauses. Off metoprolol now. -Two-dimensional echocardiogram showing normal ejection fraction, Await Orthostatic vital signs. -CT head negative for acute pathology.  2. Acute on chronic renal failure-baseline creatinine around 1.4. Presented to the hospital with a creatinine of 2.0. -Improved with IV fluids and will continue to monitor. Renal dose meds, avoid nephrotoxic agents.  3. Essential hypertension-slightly uncontrolled. Hold metoprolol due to bradycardia.  -continue lisinopril, hydralazine and follow BP.   4. DM Type II - cont. SSI and follow BS which are stable so far.   5. Hyperlipidemia - cont. Simvastatin  6. Tobacco abuse - place on nicotine patch.   Seen by PT and they recommend SNF/STR and social Work aware.   All the records are reviewed and case discussed with Care Management/Social Worker. Management plans discussed with the patient, family and they are in agreement.  CODE STATUS: Full code  DVT  Prophylaxis: Hep. SQ  TOTAL TIME TAKING CARE OF THIS PATIENT: 25 minutes.   POSSIBLE D/C IN  2-3 DAYS, DEPENDING ON CLINICAL CONDITION.   Henreitta Leber M.D on 09/15/2016 at 1:42 PM  Between 7am to 6pm - Pager - [phone]  After 6pm go to [email or web address] - Proofreader  [facility]  Office  [phone]  CC: Primary care physician; [name], MD

## 2016-09-15 NOTE — Clinical Social Work Note (Signed)
Clinical Social Work Assessment  Patient Details  Name: Manuel PARMER Sr. MRN: [id number] Date of Birth: 07-Jan-1946  Date of referral:  09/15/16               Reason for consult:  Facility Placement                Permission sought to share information with:  Facility Art therapist granted to share information::  Yes, Verbal Permission Granted  Name::        Agency::     Relationship::     Contact Information:     Housing/Transportation Living arrangements for the past 2 months:  Apartment Source of Information:  Adult Children Patient Interpreter Needed:  None Criminal Activity/Legal Involvement Pertinent to Current Situation/Hospitalization:  No - Comment as needed Significant Relationships:  Adult Children Lives with:  Self Do you feel safe going back to the place where you live?  Yes Need for family participation in patient care:  No (Coment)  Care giving concerns:  PT recommendation for STR   Social Worker assessment / plan:  CSW attempted to meet with the patient at bedside, but he was sleeping. The CSW met with his daughter Manuel Randolph outside of the room to discuss discharge planning. The patient's daughter gave verbal permission to conduct a SNF referral, and she indicated that the patient lives alone and is independent with all ADLs and IADLs at baseline.   The patient's discharge is unknown at this time, but could be in 1-2 days. CSW has sent referral, and bed offers are pending.  Employment status:  Retired Forensic scientist:  [employer] PT Recommendations:  [place] / Referral to community resources:  [place]  Patient/Family's Response to care: The patient's daughter thanked the CSW for assistance.  Patient/Family's Understanding of and Emotional Response to Diagnosis, Current Treatment, and Prognosis:  The patient's family seems to understand the level of care needs and are in  agreement.  Emotional Assessment Appearance:  Appears stated age Attitude/Demeanor/Rapport:   (Patient was sleeping) Affect (typically observed):   (Patient was sleeping) Orientation:   (Patient was sleeping) Alcohol / Substance use:  Never Used Psych involvement (Current and /or in the community):  No (Comment)  Discharge Needs  Concerns to be addressed:  Care Coordination, Discharge Planning Concerns Readmission within the last 30 days:  No Current discharge risk:  None Barriers to Discharge:  Continued Medical Work up   [employer], LCSW 09/15/2016, 4:55 PM

## 2016-09-15 NOTE — NC FL2 (Signed)
[place] LEVEL OF CARE SCREENING TOOL     IDENTIFICATION  Patient Name: Manuel JEANSONNE Sr. Birthdate: 05-Dec-1945 Sex: male Admission Date (Current Location): 09/12/2016  [place] and [place] Number:  Engineering geologist and Address:  [facility], [address] [place], [place] [address]      Provider Number: [id number]  Attending Physician Name and Address:  Henreitta Leber, MD  Relative Name and Phone Number:       Current Level of Care: Hospital Recommended Level of Care: [place] Prior Approval Number:    Date Approved/Denied: 09/15/16 PASRR Number: [id number]  Discharge Plan: SNF    Current Diagnoses: Patient Active Problem List   Diagnosis Date Noted  . Syncope 09/13/2016  . Dermatitis 03/01/2016  . Anemia, pernicious 07/04/2015  . Hip pain 07/04/2015  . Cardiac murmur 02/19/2015  . Chronic lymphocytic leukemia ([place]) 02/19/2015  . DM type 2 with diabetic peripheral neuropathy ([place]) 02/19/2015  . Essential (primary) hypertension 02/19/2015  . DM type 2 with diabetic mixed hyperlipidemia ([place]) 02/19/2015  . Diabetic peripheral neuropathy associated with type 2 diabetes mellitus ([place]) 02/19/2015  . Bleeding ulcer 02/19/2015  . Peripheral vascular disease ([place]) 02/19/2015  . 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 Encounter for screening for malignant neoplasm of prostate 02/19/2015  . Type 2 diabetes mellitus with stage 3 chronic kidney disease ([place]) 02/19/2015  . Compulsive tobacco user syndrome 02/19/2015  . 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  . Aortic valve defect 12/22/2012  . 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[place]) 11/13/2011    Orientation RESPIRATION BLADDER Height & Weight     Self, Time, Situation, Place  Normal Continent Weight: 140 lb (63.5 kg) Height:  5\' 8"  (172.7 cm)  BEHAVIORAL SYMPTOMS/MOOD NEUROLOGICAL BOWEL NUTRITION STATUS   Continent Diet (Heart Healthy/Carb modified)  AMBULATORY STATUS COMMUNICATION OF NEEDS Skin   Extensive Assist Verbally Normal                       Personal Care Assistance Level of Assistance  Bathing, Feeding, Dressing Bathing Assistance: Limited assistance Feeding assistance: Independent Dressing Assistance: Limited assistance     Functional Limitations Info             SPECIAL CARE FACTORS FREQUENCY  PT (By licensed PT)     PT Frequency: Up to 5X per day, 5 days per week              Contractures Contractures Info: Present    Additional Factors Info                  Current Medications (09/15/2016):  This is the current hospital active medication list Current Facility-Administered Medications  Medication Dose Route Frequency Provider Last Rate Last Dose  . acetaminophen (TYLENOL) tablet 650 mg  650 mg Oral Q6H PRN Harrie Foreman, MD       Or  . acetaminophen (TYLENOL) suppository 650 mg  650 mg Rectal Q6H PRN Harrie Foreman, MD      . aspirin chewable tablet 81 mg  81 mg Oral Daily Harrie Foreman, MD   81 mg at 09/15/16 1012  . docusate sodium (COLACE) capsule 100 mg  100 mg Oral BID Harrie Foreman, MD   100 mg at 09/15/16 1012  . gabapentin (NEURONTIN) capsule 300 mg  300 mg Oral TID Harrie Foreman, MD   300 mg at 09/15/16 1013  .  heparin injection 5,000 Units  5,000 Units Subcutaneous Q8H Harrie Foreman, MD   5,000 Units at 09/15/16 1500  . hydrALAZINE (APRESOLINE) injection 10 mg  10 mg Intravenous Q4H PRN Max Sane, MD      . hydrALAZINE (APRESOLINE) tablet 50 mg  50 mg Oral Q8H Sainani, Belia Heman, MD   50 mg at 09/15/16 1500  . insulin aspart (novoLOG) injection 0-5 Units  0-5 Units Subcutaneous QHS Harrie Foreman, MD      . insulin aspart (novoLOG) injection 0-9 Units  0-9 Units Subcutaneous TID WC Harrie Foreman, MD   2 Units at 09/15/16 1300  . labetalol (NORMODYNE,TRANDATE) injection 5-10 mg  5-10 mg Intravenous  Q2H PRN Max Sane, MD   5 mg at 09/13/16 1018  . lisinopril (PRINIVIL,ZESTRIL) tablet 40 mg  40 mg Oral BID Harrie Foreman, MD   40 mg at 09/15/16 1013  . nicotine (NICODERM CQ - dosed in mg/24 hours) patch 21 mg  21 mg Transdermal Daily Henreitta Leber, MD   21 mg at 09/15/16 1015  . ondansetron (ZOFRAN) tablet 4 mg  4 mg Oral Q6H PRN Harrie Foreman, MD       Or  . ondansetron [facility]) injection 4 mg  4 mg Intravenous Q6H PRN Harrie Foreman, MD      . QUEtiapine (SEROQUEL) tablet 12.5 mg  12.5 mg Oral Corwin Levins, MD   25 mg at 09/14/16 2047  . simvastatin (ZOCOR) tablet 20 mg  20 mg Oral QHS Harrie Foreman, MD   20 mg at 09/14/16 2046     Discharge Medications: Please see discharge summary for a list of discharge medications.  Relevant Imaging Results:  Relevant Lab Results:   Additional Information SS# [id number]  Zettie Pho, LCSW

## 2016-09-15 NOTE — Clinical Social Work Placement (Signed)
   CLINICAL SOCIAL WORK PLACEMENT  NOTE  Date:  09/15/2016  Patient Details  Name: Manuel AMBROSIO Sr. MRN: [id number] Date of Birth: [birth year]  Clinical Social Work is seeking post-discharge placement for this patient at the [place] level of care (*CSW will initial, date and re-position this form in  chart as items are completed):  Yes   Patient/family provided with [place] Work Department's list of facilities offering this level of care within the geographic area requested by the patient (or if unable, by the patient's family).  Yes   Patient/family informed of their freedom to choose among providers that offer the needed level of care, that participate in Medicare, Medicaid or managed care program needed by the patient, have an available bed and are willing to accept the patient.  Yes   Patient/family informed of [facility]'s ownership interest in [facility] and [facility], as well as of the fact that they are under no obligation to receive care at these facilities.  PASRR submitted to EDS on 09/15/16     PASRR number received on 09/15/16     Existing PASRR number confirmed on       FL2 transmitted to all facilities in geographic area requested by pt/family on 09/15/16     FL2 transmitted to all facilities within larger geographic area on       Patient informed that his/her managed care company has contracts with or will negotiate with certain facilities, including the following:            Patient/family informed of bed offers received.  Patient chooses bed at       Physician recommends and patient chooses bed at      Patient to be transferred to   on  .  Patient to be transferred to facility by       Patient family notified on   of transfer.  Name of family member notified:        PHYSICIAN       Additional Comment:    _______________________________________________ Zettie Pho, LCSW 09/15/2016, 4:58 PM

## 2016-09-15 NOTE — Progress Notes (Signed)
Patient is confused and agitated, stated he's going home. No amount of words by the RN is convincing. Dr. Marcille Blanco notified with a new order for haldol 2 mg IV x one dose. Will continue to monitor.

## 2016-09-16 LAB — GLUCOSE, CAPILLARY
Glucose-Capillary: 136 mg/dL — ABNORMAL HIGH (ref 65–99)
Glucose-Capillary: 259 mg/dL — ABNORMAL HIGH (ref 65–99)
Glucose-Capillary: 273 mg/dL — ABNORMAL HIGH (ref 65–99)
Glucose-Capillary: 353 mg/dL — ABNORMAL HIGH (ref 65–99)
Glucose-Capillary: 99 mg/dL (ref 65–99)
Glucose-Capillary: 188 mg/dL — ABNORMAL HIGH (ref 65–99)

## 2016-09-16 MED ORDER — SODIUM CHLORIDE 0.9% FLUSH
3.0000 mL | Freq: Two times a day (BID) | INTRAVENOUS | Status: DC
Start: 2016-09-16 — End: 2016-09-18
  Administered 2016-09-16 – 2016-09-17 (×3): 3 mL via INTRAVENOUS

## 2016-09-16 MED ORDER — CALCIUM CARBONATE ANTACID 500 MG PO CHEW
1.0000 | CHEWABLE_TABLET | Freq: Three times a day (TID) | ORAL | Status: DC | PRN
  Administered 2016-09-16 – 2016-09-17 (×3): 200 mg via ORAL
  Filled 2016-09-16 (×3): qty 1

## 2016-09-16 MED ORDER — SODIUM CHLORIDE 0.9% FLUSH: 3.0000 mL | INTRAVENOUS | Status: DC | PRN

## 2016-09-16 NOTE — Care Management (Signed)
Admitted 09/15/2016. Has had agitation requiring haldol, and  some bradycardia and confusion.  CSW has initiated a bed search.

## 2016-09-16 NOTE — Progress Notes (Signed)
[place] at [place] NAME: Manuel Randolph    MR#:  [id number]  DATE OF BIRTH:  1946/03/27  SUBJECTIVE:   Pt. Here due to Syncope and altered mental status.  No other acute events overnight. Mental status stable but remains somewhat confused. Awaiting rehabilitation placement.  REVIEW OF SYSTEMS:    Review of Systems  Unable to perform ROS: Mental acuity    Nutrition: Heart Healthy/Carb control Tolerating Diet: Yes Tolerating PT: Eval noted.    DRUG ALLERGIES:  No Known Allergies  VITALS:  Blood pressure (!) 141/90, pulse 78, temperature 97.9 F (36.6 C), resp. rate 19, height 5\' 8"  (1.727 m), weight 64.1 kg (141 lb 4.8 oz), SpO2 96 %.  PHYSICAL EXAMINATION:   Physical Exam  GENERAL:  71 y.o.-year-old patient lying in bed in no acute distress.  EYES: Pupils equal, round, reactive to light and accommodation. No scleral icterus. Extraocular muscles intact.  HEENT: Head atraumatic, normocephalic. Oropharynx and nasopharynx clear.  NECK:  Supple, no jugular venous distention. No thyroid enlargement, no tenderness.  LUNGS: Normal breath sounds bilaterally, no wheezing, rales, rhonchi. No use of accessory muscles of respiration.  CARDIOVASCULAR: S1, S2 normal. No murmurs, rubs, or gallops.  ABDOMEN: Soft, nontender, nondistended. Bowel sounds present. No organomegaly or mass.  EXTREMITIES: No cyanosis, clubbing or edema b/l.    NEUROLOGIC: Cranial nerves II through XII are intact. No focal Motor or sensory deficits b/l. Globally weak  PSYCHIATRIC: The patient is alert and oriented x 1. SKIN: No obvious rash, lesion, or ulcer.    LABORATORY PANEL:   CBC  Recent Labs Lab 09/12/16 0006  WBC 9.3  HGB 12.6*  HCT 38.5*  PLT 175   ------------------------------------------------------------------------------------------------------------------  Chemistries   Recent Labs Lab 09/12/16 2241 09/14/16 1039  NA 140 140  K 4.2  4.0  CL 105 107  CO2 26 26  GLUCOSE 101* 209*  BUN 35* 24*  CREATININE 2.09* 1.58*  CALCIUM 9.8 9.6  AST 15  --   ALT 9*  --   ALKPHOS 94  --   BILITOT 0.7  --    ------------------------------------------------------------------------------------------------------------------  Cardiac Enzymes  Recent Labs Lab 09/12/16 2241  TROPONINI <0.03   ------------------------------------------------------------------------------------------------------------------  RADIOLOGY:  No results found.   ASSESSMENT AND PLAN:   71 year old male with past medical history of essential hypertension, hyperlipidemia, GERD, diabetes, chronic anemia, [name] who presented to the hospital due to syncope.  1. Syncope- No further episodes while in the hospital. - Continues to have some bradycardia but no pauses. Off metoprolol now. Orthostatics Negative.  -Two-dimensional echocardiogram showing normal ejection fraction,  -CT head negative for acute pathology.  2. Acute on chronic renal failure-baseline creatinine around 1.4. Presented to the hospital with a creatinine of 2.0. - Resolved w/ IV fluid hydration.   3. Essential hypertension-slightly uncontrolled. Hold metoprolol due to bradycardia.  -continue lisinopril, hydralazine and follow BP.   4. DM Type II - cont. SSI and follow BS which are stable so far.   5. Hyperlipidemia - cont. Simvastatin  6. Tobacco abuse - place on nicotine patch.   7. Dementia - cont. PrN haldol for agitation/sundowning.   Seen by PT and they recommend SNF/STR and social Work aware.  Discussed and update Daughter over phone about plan of care.   All the records are reviewed and case discussed with Care Management/Social Worker. Management plans discussed with the patient, family and they are in agreement.  CODE STATUS: Full code  DVT Prophylaxis: Hep. SQ  TOTAL TIME TAKING CARE OF THIS PATIENT: 25 minutes.   POSSIBLE D/C IN 2 DAYS, DEPENDING ON CLINICAL  CONDITION.   Henreitta Leber M.D on 09/16/2016 at 3:09 PM  Between 7am to 6pm - Pager - [phone]  After 6pm go to [email or web address] - Proofreader  [facility]  Office  [phone]  CC: Primary care physician; [name], MD

## 2016-09-16 NOTE — Progress Notes (Signed)
Occupational Therapy Treatment Patient Details Name: Manuel SOVINE Sr. MRN: [id number] DOB: [birth year] Today's Date: 09/16/2016    History of present illness Pt. is a 71 y.o. male who was admitted to [facility] with Syncope. Pt. PMHx includes: HTN, DM, and GERD.   OT comments  Pt seen for brief OT treatment session this date. Pt up in [place] declined out of chair activity due to fatigue, but agreeable to education/training in energy conservation strategies to support maximal return to PLOF and falls prevention. Handout provided. Pt had no questions regarding education provided, thankful for information. Will continue to progress towards OT goals.   Follow Up Recommendations  SNF    Equipment Recommendations       Recommendations for Other Services      Precautions / Restrictions Precautions Precautions: Fall Restrictions Weight Bearing Restrictions: No       Mobility Bed Mobility               General bed mobility comments: deferred d/t pt up in recliner   Transfers                 General transfer comment: deferred d/t pt fatigue    Balance                                           ADL either performed or assessed with clinical judgement   ADL                                               Vision Patient Visual Report: No change from baseline     Perception     Praxis      Cognition Arousal/Alertness: Awake/alert Behavior During Therapy: WFL for tasks assessed/performed Overall Cognitive Status: Within Functional Limits for tasks assessed                                          Exercises Other Exercises Other Exercises: pt educated in energy conservation strategies including body/environmental positioning to minimize bending over/LOB/OH/fatigue in order to safely perform self care tasks and home mgt tasks; handout provided, pt verbalized understanding, thankful for education    Shoulder Instructions       General Comments      Pertinent Vitals/ Pain       Pain Assessment: No/denies pain  Home Living                                          Prior Functioning/Environment              Frequency  Min 1X/week        Progress Toward Goals  OT Goals(current goals can now be found in the care plan section)  Progress towards OT goals: Progressing toward goals  Acute Rehab OT Goals Patient Stated Goal: To return home OT Goal Formulation: With patient Potential to Achieve Goals: [place] Discharge plan remains appropriate;Frequency remains appropriate    Co-evaluation  AM-PAC PT "6 Clicks" Daily Activity     Outcome Measure   Help from another person eating meals?: None Help from another person taking care of personal grooming?: None Help from another person toileting, which includes using toliet, bedpan, or urinal?: A Lot Help from another person bathing (including washing, rinsing, drying)?: A Lot Help from another person to put on and taking off regular upper body clothing?: A Little Help from another person to put on and taking off regular lower body clothing?: A Lot 6 Click Score: 17    End of Session    OT Visit Diagnosis: Repeated falls (R29.6)   Activity Tolerance Patient tolerated treatment well   Patient Left with call bell/phone within reach;with chair alarm set;in chair   Nurse Communication          Time: 1349-1400 OT Time Calculation (min): 11 min  Charges: OT General Charges $OT Visit: 1 Procedure OT Treatments $Therapeutic Activity: 8-22 mins  Jeni Salles, MPH, MS, OTR/L ascom [phone] 09/16/16, 2:24 PM

## 2016-09-17 LAB — GLUCOSE, CAPILLARY
Glucose-Capillary: 153 mg/dL — ABNORMAL HIGH (ref 65–99)
Glucose-Capillary: 333 mg/dL — ABNORMAL HIGH (ref 65–99)
Glucose-Capillary: 138 mg/dL — ABNORMAL HIGH (ref 65–99)
Glucose-Capillary: 237 mg/dL — ABNORMAL HIGH (ref 65–99)

## 2016-09-17 NOTE — Progress Notes (Signed)
Patient presently sitting in the chair, ambulate with PT using a walker, denies any pain at this time, VSS, rounded with MD at bedside,. [place] for placement to rehab facility as per MD

## 2016-09-17 NOTE — Clinical Social Work Note (Signed)
CSW contacted patient's daughter and presented bed offers.  Patient's daughter would like [facility] as first choice, [facility] as a second choice, and [facility] as a third choice.  Patient will have a bed available tomorrow at SNF.  CSW to continue to follow patient's progress throughout discharge planning.  Jones Broom. [place], MSW, [place]  09/17/2016 4:24 PM

## 2016-09-17 NOTE — Progress Notes (Signed)
Physical Therapy Treatment Patient Details Name: Manuel CLINK Sr. MRN: [id number] DOB: 10/29/1945 Today's Date: 09/17/2016    History of Present Illness Pt. is a 71 y.o. male who was admitted to [facility] with Syncope. Pt. PMHx includes: HTN, DM, and GERD.    PT Comments    Pt asleep but awoke for therapy session.  Bed mobility with min assist.  He was able to stand today and ambulate x 2 around unit with walker and min assist with 2nd assist for wheel chair follow.  Pt with general fatigue today but was motivated to ambulate despite fatigue and reports of LLE weakness.  He had decreased hip and knee flexion on LLE and circumducts LLE to advance which is more pronounced as he fatigues.  While overall mobility tolerance has increased this session, he remains with significant balance and safety deficits.  He relies heavily on walker for support and would be unable to ambulate without it's support at this time.   Follow Up Recommendations  SNF     Equipment Recommendations  Rolling walker with 5" wheels    Recommendations for Other Services       Precautions / Restrictions Precautions Precautions: Fall Restrictions Weight Bearing Restrictions: No    Mobility  Bed Mobility Overal bed mobility: Needs Assistance Bed Mobility: Supine to Sit     Supine to sit: Min assist        Transfers Overall transfer level: Needs assistance Equipment used: Rolling walker (2 wheeled) Transfers: Sit to/from Stand              Ambulation/Gait Ambulation/Gait assistance: Min assist Ambulation Distance (Feet): 300 Feet Assistive device: Rolling walker (2 wheeled) Gait Pattern/deviations: Step-through pattern     General Gait Details: Pt circumducting LLE at times with gait.  Pt stated LLE feel weak.  Overall decreased hip and knee flexion when advancing LLE.  Reported he feels generally weak with gait but motivated to increase mobility today.   Stairs            Wheelchair  Mobility    Modified Rankin (Stroke Patients Only)       Balance Overall balance assessment: Needs assistance;History of Falls Sitting-balance support: Feet supported Sitting balance-Leahy Scale: Good     Standing balance support: Bilateral upper extremity supported Standing balance-Leahy Scale: Fair                              Cognition Arousal/Alertness: Awake/alert Behavior During Therapy: WFL for tasks assessed/performed Overall Cognitive Status: Within Functional Limits for tasks assessed                                        Exercises      General Comments        Pertinent Vitals/Pain Pain Assessment: No/denies pain    Home Living                      Prior Function            PT Goals (current goals can now be found in the care plan section) Progress towards PT goals: Progressing toward goals    Frequency    Min 2X/week      PT Plan Current plan remains appropriate    Co-evaluation  AM-PAC PT "6 Clicks" Daily Activity  Outcome Measure  Difficulty turning over in bed (including adjusting bedclothes, sheets and blankets)?: A Little Difficulty moving from lying on back to sitting on the side of the bed? : A Little Difficulty sitting down on and standing up from a chair with arms (e.g., wheelchair, bedside commode, etc,.)?: Total Help needed moving to and from a bed to chair (including a wheelchair)?: A Little Help needed walking in hospital room?: A Little Help needed climbing 3-5 steps with a railing? : A Lot 6 Click Score: 15    End of Session Equipment Utilized During Treatment: Gait belt Activity Tolerance: Patient limited by fatigue Patient left: in chair;with chair alarm set;with call bell/phone within reach Nurse Communication: Mobility status       Time: 4388-8757 PT Time Calculation (min) (ACUTE ONLY): 18 min  Charges:  $Gait Training: 8-22 mins                    G  Codes:       Chesley Noon, PTA 09/17/16, 12:05 PM

## 2016-09-17 NOTE — Progress Notes (Signed)
[place] at [place] NAME: Manuel Randolph    MR#:  [id number]  DATE OF BIRTH:  03/01/1946  SUBJECTIVE:   Patient here due to altered mental status/syncope. No other acute events overnight. Mental status is improved. No episodes of agitation overnight.  REVIEW OF SYSTEMS:    Review of Systems  Unable to perform ROS: Mental acuity    Nutrition: Heart Healthy/Carb control Tolerating Diet: Yes Tolerating PT: Eval noted.    DRUG ALLERGIES:  No Known Allergies  VITALS:  Blood pressure (!) 128/45, pulse 87, temperature 98.2 F (36.8 C), temperature source Oral, resp. rate 18, height 5\' 8"  (1.727 m), weight 65.8 kg (145 lb), SpO2 97 %.  PHYSICAL EXAMINATION:   Physical Exam  GENERAL:  71 y.o.-year-old patient lying in bed in no acute distress.  EYES: Pupils equal, round, reactive to light and accommodation. No scleral icterus. Extraocular muscles intact.  HEENT: Head atraumatic, normocephalic. Oropharynx and nasopharynx clear.  NECK:  Supple, no jugular venous distention. No thyroid enlargement, no tenderness.  LUNGS: Normal breath sounds bilaterally, no wheezing, rales, rhonchi. No use of accessory muscles of respiration.  CARDIOVASCULAR: S1, S2 normal. No murmurs, rubs, or gallops.  ABDOMEN: Soft, nontender, nondistended. Bowel sounds present. No organomegaly or mass.  EXTREMITIES: No cyanosis, clubbing or edema b/l.    NEUROLOGIC: Cranial nerves II through XII are intact. No focal Motor or sensory deficits b/l. Globally weak  PSYCHIATRIC: The patient is alert and oriented x 1. SKIN: No obvious rash, lesion, or ulcer.    LABORATORY PANEL:   CBC  Recent Labs Lab 09/12/16 0006  WBC 9.3  HGB 12.6*  HCT 38.5*  PLT 175   ------------------------------------------------------------------------------------------------------------------  Chemistries   Recent Labs Lab 09/12/16 2241 09/14/16 1039  NA 140 140  K 4.2 4.0  CL  105 107  CO2 26 26  GLUCOSE 101* 209*  BUN 35* 24*  CREATININE 2.09* 1.58*  CALCIUM 9.8 9.6  AST 15  --   ALT 9*  --   ALKPHOS 94  --   BILITOT 0.7  --    ------------------------------------------------------------------------------------------------------------------  Cardiac Enzymes  Recent Labs Lab 09/12/16 2241  TROPONINI <0.03   ------------------------------------------------------------------------------------------------------------------  RADIOLOGY:  No results found.   ASSESSMENT AND PLAN:   71 year old male with past medical history of essential hypertension, hyperlipidemia, GERD, diabetes, chronic anemia, [name] who presented to the hospital due to syncope.  1. Syncope- No further episodes while in the hospital. - No further episodes of bradycardia. Off metoprolol now. Orthostatics Negative.  -Two-dimensional echocardiogram showing normal ejection fraction,  -CT head negative for acute pathology.  2. Acute on chronic renal failure-baseline creatinine around 1.4. Presented to the hospital with a creatinine of 2.0. - Cr. Back to baseline after IV fluids.   3. Essential hypertension- Bp stable now. Off Metoprolol due to bradycardia. -continue lisinopril, hydralazine  4. DM Type II - cont. SSI and follow BS which are stable so far.   5. Hyperlipidemia - cont. Simvastatin  6. Tobacco abuse - cont. Nicotine patch.  7. Dementia - cont. PrN haldol for agitation/sundowning.   Seen by PT and they recommend SNF/STR and social Work aware. D/c tomorrow  All the records are reviewed and case discussed with Care Management/Social Worker. Management plans discussed with the patient, family and they are in agreement.  CODE STATUS: Full code  DVT Prophylaxis: Hep. SQ  TOTAL TIME TAKING CARE OF THIS PATIENT: 25 minutes.   POSSIBLE D/C IN  1-2 DAYS, DEPENDING ON CLINICAL CONDITION.   Henreitta Leber M.D on 09/17/2016 at 3:00 PM  Between 7am to 6pm - Pager -  [phone]  After 6pm go to [email or web address] - Proofreader  [facility]  Office  [phone]  CC: Primary care physician; [name], MD

## 2016-09-18 DIAGNOSIS — C9111 Chronic lymphocytic leukemia of B-cell type in remission: Secondary | ICD-10-CM | POA: Diagnosis not present

## 2016-09-18 DIAGNOSIS — D649 Anemia, unspecified: Secondary | ICD-10-CM | POA: Diagnosis not present

## 2016-09-18 DIAGNOSIS — R4182 Altered mental status, unspecified: Secondary | ICD-10-CM | POA: Diagnosis not present

## 2016-09-18 DIAGNOSIS — Z79899 Other long term (current) drug therapy: Secondary | ICD-10-CM | POA: Diagnosis not present

## 2016-09-18 DIAGNOSIS — I499 Cardiac arrhythmia, unspecified: Secondary | ICD-10-CM | POA: Diagnosis not present

## 2016-09-18 DIAGNOSIS — F17210 Nicotine dependence, cigarettes, uncomplicated: Secondary | ICD-10-CM | POA: Diagnosis not present

## 2016-09-18 DIAGNOSIS — B351 Tinea unguium: Secondary | ICD-10-CM | POA: Diagnosis not present

## 2016-09-18 DIAGNOSIS — E119 Type 2 diabetes mellitus without complications: Secondary | ICD-10-CM | POA: Diagnosis not present

## 2016-09-18 DIAGNOSIS — N390 Urinary tract infection, site not specified: Secondary | ICD-10-CM | POA: Diagnosis not present

## 2016-09-18 DIAGNOSIS — I10 Essential (primary) hypertension: Secondary | ICD-10-CM | POA: Diagnosis not present

## 2016-09-18 DIAGNOSIS — N179 Acute kidney failure, unspecified: Secondary | ICD-10-CM | POA: Diagnosis not present

## 2016-09-18 DIAGNOSIS — R2681 Unsteadiness on feet: Secondary | ICD-10-CM | POA: Diagnosis not present

## 2016-09-18 DIAGNOSIS — M6281 Muscle weakness (generalized): Secondary | ICD-10-CM | POA: Diagnosis not present

## 2016-09-18 DIAGNOSIS — E785 Hyperlipidemia, unspecified: Secondary | ICD-10-CM | POA: Diagnosis not present

## 2016-09-18 DIAGNOSIS — Z7984 Long term (current) use of oral hypoglycemic drugs: Secondary | ICD-10-CM | POA: Diagnosis not present

## 2016-09-18 DIAGNOSIS — Z7982 Long term (current) use of aspirin: Secondary | ICD-10-CM | POA: Diagnosis not present

## 2016-09-18 DIAGNOSIS — E1140 Type 2 diabetes mellitus with diabetic neuropathy, unspecified: Secondary | ICD-10-CM | POA: Diagnosis not present

## 2016-09-18 DIAGNOSIS — E784 Other hyperlipidemia: Secondary | ICD-10-CM | POA: Diagnosis not present

## 2016-09-18 DIAGNOSIS — C9190 Lymphoid leukemia, unspecified not having achieved remission: Secondary | ICD-10-CM | POA: Diagnosis not present

## 2016-09-18 DIAGNOSIS — F0390 Unspecified dementia without behavioral disturbance: Secondary | ICD-10-CM | POA: Diagnosis not present

## 2016-09-18 DIAGNOSIS — Z5189 Encounter for other specified aftercare: Secondary | ICD-10-CM | POA: Diagnosis not present

## 2016-09-18 DIAGNOSIS — F0151 Vascular dementia with behavioral disturbance: Secondary | ICD-10-CM | POA: Diagnosis not present

## 2016-09-18 DIAGNOSIS — E1121 Type 2 diabetes mellitus with diabetic nephropathy: Secondary | ICD-10-CM | POA: Diagnosis not present

## 2016-09-18 DIAGNOSIS — K219 Gastro-esophageal reflux disease without esophagitis: Secondary | ICD-10-CM | POA: Diagnosis not present

## 2016-09-18 DIAGNOSIS — I739 Peripheral vascular disease, unspecified: Secondary | ICD-10-CM | POA: Diagnosis not present

## 2016-09-18 DIAGNOSIS — R21 Rash and other nonspecific skin eruption: Secondary | ICD-10-CM | POA: Diagnosis not present

## 2016-09-18 DIAGNOSIS — R944 Abnormal results of kidney function studies: Secondary | ICD-10-CM | POA: Diagnosis not present

## 2016-09-18 DIAGNOSIS — N2889 Other specified disorders of kidney and ureter: Secondary | ICD-10-CM | POA: Diagnosis not present

## 2016-09-18 DIAGNOSIS — E0821 Diabetes mellitus due to underlying condition with diabetic nephropathy: Secondary | ICD-10-CM | POA: Diagnosis not present

## 2016-09-18 DIAGNOSIS — R55 Syncope and collapse: Secondary | ICD-10-CM | POA: Diagnosis not present

## 2016-09-18 DIAGNOSIS — C9110 Chronic lymphocytic leukemia of B-cell type not having achieved remission: Secondary | ICD-10-CM | POA: Diagnosis not present

## 2016-09-18 LAB — GLUCOSE, CAPILLARY: Glucose-Capillary: 172 mg/dL — ABNORMAL HIGH (ref 65–99)

## 2016-09-18 MED ORDER — QUETIAPINE FUMARATE 25 MG PO TABS: 12.5000 mg | ORAL_TABLET | Freq: Every day | ORAL | Status: DC

## 2016-09-18 MED ORDER — HYDRALAZINE HCL 50 MG PO TABS: 50.0000 mg | ORAL_TABLET | Freq: Three times a day (TID) | ORAL | Status: DC

## 2016-09-18 NOTE — Discharge Summary (Signed)
[place] at [place] NAME: Manuel Randolph    MR#:  [id number]  DATE OF BIRTH:  [birth year]  DATE OF ADMISSION:  09/12/2016 ADMITTING PHYSICIAN: Harrie Foreman, MD  DATE OF DISCHARGE: 09/18/2016  PRIMARY CARE PHYSICIAN: [name], MD    ADMISSION DIAGNOSIS:  loss of consciousness  DISCHARGE DIAGNOSIS:  Active Problems:   Syncope   SECONDARY DIAGNOSIS:   Past Medical History:  Diagnosis Date  . Anemia   . Diabetes ([place])   . GERD (gastroesophageal reflux disease)   . Hyperlipidemia   . Hypertension   . Leukemia [facility])     HOSPITAL COURSE:   71 year old male with past medical history of essential hypertension, hyperlipidemia, GERD, diabetes, chronic anemia, [name] who presented to the hospital due to syncope.  1. Syncope- etiology remains unclear but suspected to be vasovagal in nature.  No further episodes while in the hospital. - pt. Had some bradycardia but that has resolved now.  His Metoprolol was held but since his HR was increasing he can resume it upon discharge.  His Orthostatics were (-).  - CT head (-) for acute pathology and his 2-D echocardiogram showing normal ejection fraction.  2. AMS/confusion - due to dementia and cognitive decline.  - no other focal infectious of metabolic source identified. Pt. Required some intermittent haldol for agitation while in the hospital. Will cont. Seroquel at bedtime and he has improved.   3. Acute on chronic renal failure-baseline creatinine around 1.4. Presented to the hospital with a creatinine of 2.0. - pt. Was hydrated with IV fluids and his Cr. Has improved and now back to baseline now.    4. Essential hypertension- BP stable. Was taken off Metoprolol due to some episodes of bradycardia but that has improved and now pt. Is tachy and som will resume Metoprolol upon discharge today.  - cont. Lisinopril, Hydralazine.   5. DM Type II - while in the hospital pt. Was on SSI  but will resume Metformin upon discharged.   6. Hyperlipidemia - He will cont. Simvastatin  7. Dementia - cont. Seroquel at bedtime.  Was given some PRN Haldol for agitation while in the hospital.  No episodes of agitation or sundowning in the past 2 days.   DISCHARGE CONDITIONS:   Stable  CONSULTS OBTAINED:    DRUG ALLERGIES:  No Known Allergies  DISCHARGE MEDICATIONS:   Allergies as of 09/18/2016   No Known Allergies     Medication List    TAKE these medications   aspirin 81 MG tablet Take 1 tablet (81 mg total) by mouth daily.   gabapentin 300 MG capsule Commonly known as:  NEURONTIN TAKE 1 CAPSULE BY MOUTH 3  TIMES DAILY   glucose blood test strip Use as instructed   hydrALAZINE 50 MG tablet Commonly known as:  APRESOLINE Take 1 tablet (50 mg total) by mouth every 8 (eight) hours.   Lancets 28G Misc 1 each by Does not apply route daily.   lisinopril 40 MG tablet Commonly known as:  PRINIVIL,ZESTRIL Take 1 tablet (40 mg total) by mouth 2 (two) times daily.   metFORMIN 500 MG tablet Commonly known as:  GLUCOPHAGE Take 1 tablet (500 mg total) by mouth 2 (two) times daily with a meal.   metoprolol tartrate 25 MG tablet Commonly known as:  LOPRESSOR Take 1 tablet (25 mg total) by mouth 2 (two) times daily.   mupirocin ointment 2 % Commonly known as:  [employer]  to affected area 3 times daily   QUEtiapine 25 MG tablet Commonly known as:  SEROQUEL Take 0.5 tablets (12.5 mg total) by mouth at bedtime.   simvastatin 20 MG tablet Commonly known as:  ZOCOR TAKE 1 TABLET BY MOUTH AT  BEDTIME   triamcinolone cream 0.1 % Commonly known as:  KENALOG APPLY TO AFFECTED AREA TWICE A DAY         DISCHARGE INSTRUCTIONS:   DIET:  Cardiac diet and Diabetic diet  DISCHARGE CONDITION:  Stable  ACTIVITY:  Activity as tolerated  OXYGEN:  Home Oxygen: No.   Oxygen Delivery: room air  DISCHARGE LOCATION:  nursing home   If you experience  worsening of your admission symptoms, develop shortness of breath, life threatening emergency, suicidal or homicidal thoughts you must seek medical attention immediately by calling 911 or calling your MD immediately  if symptoms less severe.  You Must read complete instructions/literature along with all the possible adverse reactions/side effects for all the Medicines you take and that have been prescribed to you. Take any new Medicines after you have completely understood and accpet all the possible adverse reactions/side effects.   Please note  You were cared for by a hospitalist during your hospital stay. If you have any questions about your discharge medications or the care you received while you were in the hospital after you are discharged, you can call the unit and asked to speak with the hospitalist on call if the hospitalist that took care of you is not available. Once you are discharged, your primary care physician will handle any further medical issues. Please note that NO REFILLS for any discharge medications will be authorized once you are discharged, as it is imperative that you return to your primary care physician (or establish a relationship with a primary care physician if you do not have one) for your aftercare needs so that they can reassess your need for medications and monitor your lab values.     Today   No acute events overnight. No new complaints.  Son at bedside.    VITAL SIGNS:  Blood pressure (!) 162/46, pulse 78, temperature 98.5 F (36.9 C), temperature source Oral, resp. rate 20, height 5\' 8"  (1.727 m), weight 69.9 kg (154 lb), SpO2 100 %.  I/O:    Intake/Output Summary (Last 24 hours) at 09/18/16 1013 Last data filed at 09/18/16 0930  Gross per 24 hour  Intake              480 ml  Output             2000 ml  Net            -1520 ml    PHYSICAL EXAMINATION:   GENERAL:  71 y.o.-year-old patient lying in bed in no acute distress.  EYES: Pupils equal,  round, reactive to light and accommodation. No scleral icterus. Extraocular muscles intact.  HEENT: Head atraumatic, normocephalic. Oropharynx and nasopharynx clear.  NECK:  Supple, no jugular venous distention. No thyroid enlargement, no tenderness.  LUNGS: Normal breath sounds bilaterally, no wheezing, rales, rhonchi. No use of accessory muscles of respiration.  CARDIOVASCULAR: S1, S2 normal. No murmurs, rubs, or gallops.  ABDOMEN: Soft, nontender, nondistended. Bowel sounds present. No organomegaly or mass.  EXTREMITIES: No cyanosis, clubbing or edema b/l.    NEUROLOGIC: Cranial nerves II through XII are intact. No focal Motor or sensory deficits b/l.  PSYCHIATRIC: The patient is alert and oriented x 2. SKIN: No obvious rash,  lesion, or ulcer.   DATA REVIEW:   CBC  Recent Labs Lab 09/12/16 0006  WBC 9.3  HGB 12.6*  HCT 38.5*  PLT 175    Chemistries   Recent Labs Lab 09/12/16 2241 09/14/16 1039  NA 140 140  K 4.2 4.0  CL 105 107  CO2 26 26  GLUCOSE 101* 209*  BUN 35* 24*  CREATININE 2.09* 1.58*  CALCIUM 9.8 9.6  AST 15  --   ALT 9*  --   ALKPHOS 94  --   BILITOT 0.7  --     Cardiac Enzymes  Recent Labs Lab 09/12/16 2241  TROPONINI <0.03    Microbiology Results  No results found for this or any previous visit.  RADIOLOGY:  No results found.    Management plans discussed with the patient, family and they are in agreement.  CODE STATUS:     Code Status Orders        Start     Ordered   09/13/16 0517  Full code  Continuous     09/13/16 0516    Code Status History    Date Active Date Inactive Code Status Order ID Comments User Context   This patient has a current code status but no historical code status.      TOTAL TIME TAKING CARE OF THIS PATIENT: 40 minutes.    Henreitta Leber M.D on 09/18/2016 at 10:13 AM  Between 7am to 6pm - Pager - [phone]  After 6pm go to [email or web address] - Proofreader  [facility] [place]  Office  [phone]  CC: Primary care physician; [name], MD

## 2016-09-18 NOTE — Progress Notes (Signed)
Patient is being discharge to [facility] resourse, report called to receiving nurse, discharge package given to patient son, patient left the unit with his  son via car to the  facility

## 2016-09-18 NOTE — Clinical Social Work Note (Addendum)
CSW spoke to [facility] and they do have a bed available for patient and can accept him today.  CSW updated patient and his family, patient's family will transport patient to SNF.  Patient to be d/c'ed today to [facility] [place].  Patient and family agreeable to plans will transport via family's vehicle RN to call report.  Evette Cristal, MSW, [place]   Jones Broom. [place], MSW, [place]  09/18/2016 9:40 AM

## 2016-09-19 DIAGNOSIS — C9110 Chronic lymphocytic leukemia of B-cell type not having achieved remission: Secondary | ICD-10-CM | POA: Diagnosis not present

## 2016-09-19 DIAGNOSIS — D649 Anemia, unspecified: Secondary | ICD-10-CM | POA: Diagnosis not present

## 2016-09-19 DIAGNOSIS — E1140 Type 2 diabetes mellitus with diabetic neuropathy, unspecified: Secondary | ICD-10-CM | POA: Diagnosis not present

## 2016-09-19 DIAGNOSIS — M6281 Muscle weakness (generalized): Secondary | ICD-10-CM | POA: Diagnosis not present

## 2016-09-19 DIAGNOSIS — F0390 Unspecified dementia without behavioral disturbance: Secondary | ICD-10-CM | POA: Diagnosis not present

## 2016-09-19 DIAGNOSIS — I10 Essential (primary) hypertension: Secondary | ICD-10-CM | POA: Diagnosis not present

## 2016-09-19 DIAGNOSIS — R55 Syncope and collapse: Secondary | ICD-10-CM | POA: Diagnosis not present

## 2016-09-19 DIAGNOSIS — E785 Hyperlipidemia, unspecified: Secondary | ICD-10-CM | POA: Diagnosis not present

## 2016-09-25 ENCOUNTER — Telehealth: Payer: Self-pay

## 2016-09-25 NOTE — Telephone Encounter (Signed)
Called Patient on number in chart and no answer. Left VM. Called [facility] nursing home and spoke to [place] with transportation. Told her that patient is on for appt for tomorrow for hospital follow up and does not need to be seen with [place] per Dr. Army Melia. She looked patient up. Patient has still in the facility and is not on the transportation list for tomorrow for any appt so patient should not be here. Will let front desk know to cancel patient appts.

## 2016-09-26 ENCOUNTER — Inpatient Hospital Stay: Payer: Medicare Other | Admitting: Internal Medicine

## 2016-10-01 DIAGNOSIS — R55 Syncope and collapse: Secondary | ICD-10-CM | POA: Diagnosis not present

## 2016-10-01 DIAGNOSIS — M6281 Muscle weakness (generalized): Secondary | ICD-10-CM | POA: Diagnosis not present

## 2016-10-01 DIAGNOSIS — I499 Cardiac arrhythmia, unspecified: Secondary | ICD-10-CM | POA: Diagnosis not present

## 2016-10-01 DIAGNOSIS — I10 Essential (primary) hypertension: Secondary | ICD-10-CM | POA: Diagnosis not present

## 2016-10-01 DIAGNOSIS — E1140 Type 2 diabetes mellitus with diabetic neuropathy, unspecified: Secondary | ICD-10-CM | POA: Diagnosis not present

## 2016-10-01 DIAGNOSIS — C9110 Chronic lymphocytic leukemia of B-cell type not having achieved remission: Secondary | ICD-10-CM | POA: Diagnosis not present

## 2016-10-01 DIAGNOSIS — E785 Hyperlipidemia, unspecified: Secondary | ICD-10-CM | POA: Diagnosis not present

## 2016-10-01 DIAGNOSIS — F0390 Unspecified dementia without behavioral disturbance: Secondary | ICD-10-CM | POA: Diagnosis not present

## 2016-10-01 DIAGNOSIS — D649 Anemia, unspecified: Secondary | ICD-10-CM | POA: Diagnosis not present

## 2016-10-04 ENCOUNTER — Other Ambulatory Visit: Payer: Self-pay

## 2016-10-04 DIAGNOSIS — C9110 Chronic lymphocytic leukemia of B-cell type not having achieved remission: Secondary | ICD-10-CM

## 2016-10-04 NOTE — Progress Notes (Signed)
Manuel Randolph  [phone]  [phone]     Manuel [name] DOB: June 20, 1945  MR#: [id number]  VFI#:[id number]  Patient Care Team: [name], MD as PCP - General (Family Medicine)  CHIEF COMPLAINT: [name]  INTERVAL HISTORY: Patient returns to clinic today for repeat laboratory and further evaluation. He continues to feel well and remains asymptomatic. He denies any fevers, night sweats, or weight loss. He has no lymphadenopathy. He denies any chest pain or shortness of breath. He has no nausea, vomiting, constipation, or diarrhea. He has no urinary complaints. Patient feels at his baseline and offers no specific complaints today.  REVIEW OF SYSTEMS:   Review of Systems  Constitutional: Negative.  Negative for diaphoresis, fever, malaise/fatigue and weight loss.  Respiratory: Negative.  Negative for cough and shortness of breath.   Cardiovascular: Negative.  Negative for chest pain and leg swelling.  Gastrointestinal: Negative.  Negative for abdominal pain.  Genitourinary: Negative.   Musculoskeletal: Negative.   Skin: Negative.  Negative for rash.  Neurological: Negative.  Negative for sensory change and weakness.  Psychiatric/Behavioral: Negative.  The patient is not nervous/anxious.     As per HPI. Otherwise, a complete review of systems is negative.   PAST MEDICAL HISTORY: Past Medical History:  Diagnosis Date  . Anemia   . Diabetes ([place])   . GERD (gastroesophageal reflux disease)   . Hyperlipidemia   . Hypertension   . Leukemia ([place])     PAST SURGICAL HISTORY: Past Surgical History:  Procedure Laterality Date  . CATARACT EXTRACTION W/PHACO Left 09/13/2014   Procedure: CATARACT EXTRACTION PHACO AND INTRAOCULAR LENS PLACEMENT (IOC);  Surgeon: Birder Robson, MD;  Location: [facility] ORS;  Service: Ophthalmology;  Laterality: Left;  [place] 01:03   . COLONOSCOPY  2014  . POPLITEAL ARTERY ANGIOPLASTY Left 2014  . PTCA Right    leg  .  UPPER GASTROINTESTINAL ENDOSCOPY  2014    FAMILY HISTORY Family History  Problem Relation Age of Onset  . Diabetes Mother   . Diabetes Father      ADVANCED DIRECTIVES:    HEALTH MAINTENANCE: Social History  Substance Use Topics  . Smoking status: Current Every Day Smoker    Packs/day: 0.50    Types: Cigarettes  . Smokeless tobacco: Never Used  . Alcohol use No      No Known Allergies  Current Outpatient Prescriptions  Medication Sig Dispense Refill  . aspirin 81 MG tablet Take 1 tablet (81 mg total) by mouth daily. 30 tablet 5  . gabapentin (NEURONTIN) 300 MG capsule TAKE 1 CAPSULE BY MOUTH 3  TIMES DAILY 270 capsule 0  . glucose blood test strip Use as instructed 100 each 12  . hydrALAZINE (APRESOLINE) 50 MG tablet Take 1 tablet (50 mg total) by mouth every 8 (eight) hours.    . Lancets 28G MISC 1 each by Does not apply route daily. 100 each 3  . lisinopril (PRINIVIL,ZESTRIL) 40 MG tablet Take 1 tablet (40 mg total) by mouth 2 (two) times daily. 180 tablet 3  . metFORMIN (GLUCOPHAGE) 500 MG tablet Take 1 tablet (500 mg total) by mouth 2 (two) times daily with a meal. 180 tablet 1  . metoprolol tartrate (LOPRESSOR) 25 MG tablet Take 1 tablet (25 mg total) by mouth 2 (two) times daily. 60 tablet 0  . mupirocin ointment (BACTROBAN) 2 % Apply to affected area 3 times daily 22 g 5  . QUEtiapine (SEROQUEL) 25 MG tablet Take 0.5 tablets (12.5  mg total) by mouth at bedtime.    . simvastatin (ZOCOR) 20 MG tablet TAKE 1 TABLET BY MOUTH AT  BEDTIME 90 tablet 0  . triamcinolone cream (KENALOG) 0.1 % APPLY TO AFFECTED AREA TWICE A DAY 120 g 0   No current facility-administered medications for this visit.     OBJECTIVE: BP (!) 209/70 (BP Location: Right Arm, Patient Position: Sitting)   Pulse (!) 56   Temp (!) 96 F (35.6 C) (Tympanic)   Wt 150 lb 6.4 oz (68.2 kg)   BMI 22.87 kg/m    Body mass index is 22.87 kg/m.    ECOG FS:0 - Asymptomatic  General: Well-developed,  well-nourished, no acute distress. Eyes: Pink conjunctiva, anicteric sclera. Lungs: Clear to auscultation bilaterally. Heart: Regular rate and rhythm. No rubs, murmurs, or gallops. Abdomen: Soft, nontender, nondistended. No organomegaly noted, normoactive bowel sounds. Musculoskeletal: No edema, cyanosis, or clubbing. Neuro: Alert, answering all questions appropriately. Cranial nerves grossly intact. Skin: No rashes or petechiae noted. Psych: Normal affect.  LAB RESULTS: CBC    Component Value Date/Time   WBC 7.3 10/07/2016 1000   RBC 3.75 (L) 10/07/2016 1000   HGB 11.2 (L) 10/07/2016 1000   HGB 12.6 (L) 06/15/2013 1128   HCT 33.8 (L) 10/07/2016 1000   HCT 39.6 (L) 06/15/2013 1128   PLT 192 10/07/2016 1000   PLT 141 (L) 06/15/2013 1128   MCV 90.2 10/07/2016 1000   MCV 92 06/15/2013 1128   MCH 29.7 10/07/2016 1000   MCHC 33.0 10/07/2016 1000   RDW 15.4 (H) 10/07/2016 1000   RDW 16.2 (H) 06/15/2013 1128   LYMPHSABS 2.4 10/07/2016 1000   LYMPHSABS 9.8 (H) 06/15/2013 1128   MONOABS 0.6 10/07/2016 1000   MONOABS 0.8 06/15/2013 1128   EOSABS 0.3 10/07/2016 1000   EOSABS 0.3 06/15/2013 1128   BASOSABS 0.1 10/07/2016 1000   BASOSABS 0.1 06/15/2013 1128     STUDIES: No results found.  ASSESSMENT:  [name], Rai stage 0.  PLAN:   1. [name]: Patient was originally diagnosed in 2014 and has been under observation only. His white blood count remains within normal limits, although he does have a noted lymphocytosis. Quantitative immunoglobulins and FISH panel were last performed Nov 2016.  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 Return to clinic in 1 year with repeat laboratory work and further evaluation. 2. Mild thrombocytopenia: Resolved. 3. Elevated creatinine: Appears to be at his baseline, monitor. 4. Anemia: Mild, monitor.   Patient expressed understanding and was in agreement with this plan. He also understands that He can call clinic  at any time with any questions, concerns, or complaints.    Lloyd Huger, MD 10/07/16 11:19 AM

## 2016-10-07 ENCOUNTER — Inpatient Hospital Stay: Payer: Medicare (Managed Care)

## 2016-10-07 ENCOUNTER — Inpatient Hospital Stay: Payer: Medicare (Managed Care) | Attending: Oncology | Admitting: Oncology

## 2016-10-07 ENCOUNTER — Encounter: Payer: Self-pay | Admitting: Oncology

## 2016-10-07 VITALS — BP 209/70 | HR 56 | Temp 96.0°F | Wt 150.4 lb

## 2016-10-07 DIAGNOSIS — Z79899 Other long term (current) drug therapy: Secondary | ICD-10-CM | POA: Insufficient documentation

## 2016-10-07 DIAGNOSIS — C9110 Chronic lymphocytic leukemia of B-cell type not having achieved remission: Secondary | ICD-10-CM | POA: Insufficient documentation

## 2016-10-07 DIAGNOSIS — D649 Anemia, unspecified: Secondary | ICD-10-CM | POA: Diagnosis not present

## 2016-10-07 DIAGNOSIS — K219 Gastro-esophageal reflux disease without esophagitis: Secondary | ICD-10-CM | POA: Insufficient documentation

## 2016-10-07 DIAGNOSIS — I10 Essential (primary) hypertension: Secondary | ICD-10-CM | POA: Diagnosis not present

## 2016-10-07 DIAGNOSIS — Z7982 Long term (current) use of aspirin: Secondary | ICD-10-CM | POA: Insufficient documentation

## 2016-10-07 DIAGNOSIS — C9190 Lymphoid leukemia, unspecified not having achieved remission: Secondary | ICD-10-CM | POA: Diagnosis not present

## 2016-10-07 DIAGNOSIS — F17210 Nicotine dependence, cigarettes, uncomplicated: Secondary | ICD-10-CM | POA: Insufficient documentation

## 2016-10-07 DIAGNOSIS — R944 Abnormal results of kidney function studies: Secondary | ICD-10-CM | POA: Insufficient documentation

## 2016-10-07 DIAGNOSIS — Z7984 Long term (current) use of oral hypoglycemic drugs: Secondary | ICD-10-CM | POA: Insufficient documentation

## 2016-10-07 DIAGNOSIS — E785 Hyperlipidemia, unspecified: Secondary | ICD-10-CM | POA: Insufficient documentation

## 2016-10-07 DIAGNOSIS — E119 Type 2 diabetes mellitus without complications: Secondary | ICD-10-CM | POA: Diagnosis not present

## 2016-10-07 LAB — COMPREHENSIVE METABOLIC PANEL
ALBUMIN: 3.7 g/dL (ref 3.5–5.0)
ALK PHOS: 87 U/L (ref 38–126)
ALT: 9 U/L — ABNORMAL LOW (ref 17–63)
ANION GAP: 9 (ref 5–15)
AST: 15 U/L (ref 15–41)
BILIRUBIN TOTAL: 0.5 mg/dL (ref 0.3–1.2)
BUN: 29 mg/dL — AB (ref 6–20)
CALCIUM: 9.6 mg/dL (ref 8.9–10.3)
CO2: 24 mmol/L (ref 22–32)
Chloride: 105 mmol/L (ref 101–111)
Creatinine, Ser: 1.65 mg/dL — ABNORMAL HIGH (ref 0.61–1.24)
GFR calc Af Amer: 47 mL/min — ABNORMAL LOW (ref >60)
GFR, EST NON AFRICAN AMERICAN: 41 mL/min — AB (ref >60)
GLUCOSE: 189 mg/dL — AB (ref 65–99)
POTASSIUM: 5.2 mmol/L — AB (ref 3.5–5.1)
Sodium: 138 mmol/L (ref 135–145)
Total Protein: 7.6 g/dL (ref 6.5–8.1)

## 2016-10-07 LAB — CBC WITH DIFFERENTIAL/PLATELET
Basophils Absolute: 0.1 10*3/uL (ref 0–0.1)
Basophils Relative: 1 %
Eosinophils Absolute: 0.3 10*3/uL (ref 0–0.7)
Eosinophils Relative: 4 %
HEMATOCRIT: 33.8 % — AB (ref 40.0–52.0)
HEMOGLOBIN: 11.2 g/dL — AB (ref 13.0–18.0)
LYMPHS ABS: 2.4 10*3/uL (ref 1.0–3.6)
LYMPHS PCT: 33 %
MCH: 29.7 pg (ref 26.0–34.0)
MCHC: 33 g/dL (ref 32.0–36.0)
MCV: 90.2 fL (ref 80.0–100.0)
Monocytes Absolute: 0.6 10*3/uL (ref 0.2–1.0)
Monocytes Relative: 8 %
NEUTROS ABS: 3.8 10*3/uL (ref 1.4–6.5)
NEUTROS PCT: 52 %
Platelets: 192 10*3/uL (ref 150–440)
RBC: 3.75 MIL/uL — ABNORMAL LOW (ref 4.40–5.90)
RDW: 15.4 % — ABNORMAL HIGH (ref 11.5–14.5)
WBC: 7.3 10*3/uL (ref 3.8–10.6)

## 2016-10-07 LAB — LACTATE DEHYDROGENASE: LDH: 122 U/L (ref 98–192)

## 2016-10-09 DIAGNOSIS — F0390 Unspecified dementia without behavioral disturbance: Secondary | ICD-10-CM | POA: Diagnosis not present

## 2016-10-09 DIAGNOSIS — R531 Weakness: Secondary | ICD-10-CM | POA: Diagnosis not present

## 2016-10-10 DIAGNOSIS — R531 Weakness: Secondary | ICD-10-CM | POA: Diagnosis not present

## 2016-10-10 DIAGNOSIS — F0390 Unspecified dementia without behavioral disturbance: Secondary | ICD-10-CM | POA: Diagnosis not present

## 2016-10-11 ENCOUNTER — Telehealth: Payer: Self-pay

## 2016-10-11 DIAGNOSIS — F0390 Unspecified dementia without behavioral disturbance: Secondary | ICD-10-CM | POA: Diagnosis not present

## 2016-10-11 DIAGNOSIS — R531 Weakness: Secondary | ICD-10-CM | POA: Diagnosis not present

## 2016-10-11 NOTE — Telephone Encounter (Signed)
Nurse from [facility] stating pt has some areas on both legs that are thickend, dark colored, dry patches that look a bit concerning. Pt daughter stated he has had them before on his arms, and was given Triamcinolone cream and it helped. Also, called for concern of his early onset dementia. He is having trouble using pill box, poor historian- Etc... - Gave verbal Ok for him to call a Education officer, museum or Adult [employer] if needed. Pt will be here next week for hosp f/up and to be seen for patches on skin.

## 2016-10-14 ENCOUNTER — Telehealth: Payer: Self-pay

## 2016-10-14 DIAGNOSIS — R531 Weakness: Secondary | ICD-10-CM | POA: Diagnosis not present

## 2016-10-14 DIAGNOSIS — F0390 Unspecified dementia without behavioral disturbance: Secondary | ICD-10-CM | POA: Diagnosis not present

## 2016-10-14 NOTE — Telephone Encounter (Signed)
Caprice Red from [facility] called about pt stating he fell yesterday while walking from the couch. Pt told Mr. Timmothy Sours that this is probably because he was drinking alcohol. Also, pt was not using his walker. He declines to see his PCP. Caprice Red gave pt instructions not to drink alcohol as much, and to use his walker when walking. Gave doctors satisfaction of this recommendations to pt.

## 2016-10-15 ENCOUNTER — Inpatient Hospital Stay: Payer: Medicare Other | Admitting: Internal Medicine

## 2016-10-15 DIAGNOSIS — R531 Weakness: Secondary | ICD-10-CM | POA: Diagnosis not present

## 2016-10-15 DIAGNOSIS — F0390 Unspecified dementia without behavioral disturbance: Secondary | ICD-10-CM | POA: Diagnosis not present

## 2016-10-16 DIAGNOSIS — R531 Weakness: Secondary | ICD-10-CM | POA: Diagnosis not present

## 2016-10-16 DIAGNOSIS — F0390 Unspecified dementia without behavioral disturbance: Secondary | ICD-10-CM | POA: Diagnosis not present

## 2016-10-17 ENCOUNTER — Telehealth: Payer: Self-pay

## 2016-10-17 DIAGNOSIS — R531 Weakness: Secondary | ICD-10-CM | POA: Diagnosis not present

## 2016-10-17 DIAGNOSIS — F0390 Unspecified dementia without behavioral disturbance: Secondary | ICD-10-CM | POA: Diagnosis not present

## 2016-10-17 NOTE — Telephone Encounter (Signed)
Evelena Peat at [facility] called concerning pt- said that he had "some abnormal findings to talk to you about". Pt has increased swelling in both legs. B/P is 148/78- he is going to "set the parameter to 413-643 systolic and 83-77 diastolic so you won't have to be bothered with that so much." Pt also stated that he had a fall and now he is complaining of L) hip pain 4/10 on pain scale. Pt missed 3 days of meds over the past "several days" even though he has his pills in a pill organizer and the daughter calls him at the times he is supposed to take them. She tries to "guide him through taking the meds." Evelena Peat found Mupirocin that he is supposed to be putting on his hands. Therefore, he added that med to his med list on their end. He does have dry, patchy spots on his hands. Daughter is trying/ wanting to get him into a facility primarily because he is starting to show confusion and locked himself out of the house a couple of nights ago, a neighbor helped him. Otherwise, he is oriented. [phone]

## 2016-10-18 DIAGNOSIS — R531 Weakness: Secondary | ICD-10-CM | POA: Diagnosis not present

## 2016-10-18 DIAGNOSIS — F0390 Unspecified dementia without behavioral disturbance: Secondary | ICD-10-CM | POA: Diagnosis not present

## 2016-10-18 NOTE — Telephone Encounter (Signed)
Evelena Peat informed of Dr. Gaspar Cola note- he asked if I called the daughter to inform pt did not show up for his appt 3 days ago and I explained the pt told us not to speak to anybody in family about his health or chart so I'm not allowed to call her. He stated he has not told them that so he will call the daughter and inform her.

## 2016-10-18 NOTE — Telephone Encounter (Signed)
Please call the RN and let them know that Manuel Randolph did not show up for his appt three days ago.  There is nothing I can do other than request social worker to see if he is a danger to himself.  He needs to be seen about hip pain if it continues.   I agree with the change in BP parameters.

## 2016-10-21 DIAGNOSIS — F0390 Unspecified dementia without behavioral disturbance: Secondary | ICD-10-CM | POA: Diagnosis not present

## 2016-10-21 DIAGNOSIS — R531 Weakness: Secondary | ICD-10-CM | POA: Diagnosis not present

## 2016-10-22 ENCOUNTER — Telehealth: Payer: Self-pay

## 2016-10-22 DIAGNOSIS — R531 Weakness: Secondary | ICD-10-CM | POA: Diagnosis not present

## 2016-10-22 DIAGNOSIS — F0390 Unspecified dementia without behavioral disturbance: Secondary | ICD-10-CM | POA: Diagnosis not present

## 2016-10-22 NOTE — Telephone Encounter (Signed)
Called pt [facility] home health nurse - Evelena Peat . Informed him Dr. Army Melia received a PCS form but she has never filled these out before and will not. He stated he will relay the message to his company. - Also, stated pt is taking Kenalog cream for rash on hands twice daily but the rash is now gone. Informed him Dr. Army Melia did not prescribe this for pt and that the ED did. But if the rash is gone then the cream is no longer needed.

## 2016-10-24 DIAGNOSIS — R531 Weakness: Secondary | ICD-10-CM | POA: Diagnosis not present

## 2016-10-24 DIAGNOSIS — F0390 Unspecified dementia without behavioral disturbance: Secondary | ICD-10-CM | POA: Diagnosis not present

## 2016-10-30 DIAGNOSIS — F0390 Unspecified dementia without behavioral disturbance: Secondary | ICD-10-CM | POA: Diagnosis not present

## 2016-10-30 DIAGNOSIS — R531 Weakness: Secondary | ICD-10-CM | POA: Diagnosis not present

## 2016-11-04 ENCOUNTER — Encounter (INDEPENDENT_AMBULATORY_CARE_PROVIDER_SITE_OTHER): Payer: Medicare Other | Admitting: Internal Medicine

## 2016-11-04 DIAGNOSIS — D510 Vitamin B12 deficiency anemia due to intrinsic factor deficiency: Secondary | ICD-10-CM

## 2016-11-04 DIAGNOSIS — E1122 Type 2 diabetes mellitus with diabetic chronic kidney disease: Secondary | ICD-10-CM

## 2016-11-04 DIAGNOSIS — R55 Syncope and collapse: Secondary | ICD-10-CM

## 2016-11-04 DIAGNOSIS — F17200 Nicotine dependence, unspecified, uncomplicated: Secondary | ICD-10-CM

## 2016-11-04 DIAGNOSIS — I10 Essential (primary) hypertension: Secondary | ICD-10-CM

## 2016-11-04 DIAGNOSIS — N183 Chronic kidney disease, stage 3 (moderate): Secondary | ICD-10-CM

## 2016-11-04 DIAGNOSIS — C9110 Chronic lymphocytic leukemia of B-cell type not having achieved remission: Secondary | ICD-10-CM

## 2016-11-04 NOTE — Progress Notes (Signed)
Received orders from [facility].  Start of care 10/09/16.  Certification period 10/09/16 through 12/07/16.  Orders are reviewed, signed and faxed.

## 2016-11-06 ENCOUNTER — Encounter: Payer: Self-pay | Admitting: Internal Medicine

## 2016-11-06 ENCOUNTER — Ambulatory Visit (INDEPENDENT_AMBULATORY_CARE_PROVIDER_SITE_OTHER): Payer: Medicare Other | Admitting: Internal Medicine

## 2016-11-06 VITALS — BP 126/64 | HR 56 | Ht 67.0 in | Wt 143.6 lb

## 2016-11-06 DIAGNOSIS — E1122 Type 2 diabetes mellitus with diabetic chronic kidney disease: Secondary | ICD-10-CM | POA: Diagnosis not present

## 2016-11-06 DIAGNOSIS — N183 Chronic kidney disease, stage 3 (moderate): Secondary | ICD-10-CM | POA: Diagnosis not present

## 2016-11-06 DIAGNOSIS — E1142 Type 2 diabetes mellitus with diabetic polyneuropathy: Secondary | ICD-10-CM | POA: Diagnosis not present

## 2016-11-06 DIAGNOSIS — I10 Essential (primary) hypertension: Secondary | ICD-10-CM | POA: Diagnosis not present

## 2016-11-06 DIAGNOSIS — I358 Other nonrheumatic aortic valve disorders: Secondary | ICD-10-CM | POA: Diagnosis not present

## 2016-11-06 DIAGNOSIS — C9110 Chronic lymphocytic leukemia of B-cell type not having achieved remission: Secondary | ICD-10-CM

## 2016-11-06 DIAGNOSIS — I679 Cerebrovascular disease, unspecified: Secondary | ICD-10-CM | POA: Diagnosis not present

## 2016-11-06 DIAGNOSIS — I639 Cerebral infarction, unspecified: Secondary | ICD-10-CM | POA: Diagnosis not present

## 2016-11-06 DIAGNOSIS — M25552 Pain in left hip: Secondary | ICD-10-CM

## 2016-11-06 DIAGNOSIS — R531 Weakness: Secondary | ICD-10-CM | POA: Diagnosis not present

## 2016-11-06 DIAGNOSIS — L309 Dermatitis, unspecified: Secondary | ICD-10-CM | POA: Diagnosis not present

## 2016-11-06 DIAGNOSIS — C9190 Lymphoid leukemia, unspecified not having achieved remission: Secondary | ICD-10-CM

## 2016-11-06 DIAGNOSIS — F0390 Unspecified dementia without behavioral disturbance: Secondary | ICD-10-CM | POA: Diagnosis not present

## 2016-11-06 DIAGNOSIS — I6381 Other cerebral infarction due to occlusion or stenosis of small artery: Secondary | ICD-10-CM | POA: Insufficient documentation

## 2016-11-06 MED ORDER — HYDRALAZINE HCL 50 MG PO TABS: 50.0000 mg | ORAL_TABLET | Freq: Three times a day (TID) | ORAL | 1 refills | Status: DC

## 2016-11-06 MED ORDER — QUETIAPINE FUMARATE 25 MG PO TABS: 12.5000 mg | ORAL_TABLET | Freq: Every day | ORAL | 1 refills | Status: DC

## 2016-11-06 NOTE — Progress Notes (Signed)
Date:  11/06/2016   Name:  Manuel Randolph   DOB:  01/16/1946   MRN:  [id number]  Patient is accompanied by his daughter - Manuel Randolph and son - Manuel Randolph.  Chief Complaint: Diabetes; Hypertension (Swelling in feet. On and off. Right foot swells double size of normal foot. ); Rash (Legs and feet and Left butt cheeck. Patient says it does not itch or hurt. Its looks flaky and dry. ); and Urinary Incontinence (Having issues with holding in urine and BM. - Has under wear but pt refuses to wear them. Daughter having to clean up floor and furnitare constantly- messing up alot of clothes. ) Admitted to [facility] in June then transferred to [facility].  [facility]charged from [facility] about one month ago. Summary of hospital stay:  1. Syncope- etiology remains unclear but suspected to be vasovagal in nature.  No further episodes while in the hospital. - pt. Had some bradycardia but that has resolved now.  His Metoprolol was held but since his HR was increasing he can resume it upon discharge.  His Orthostatics were (-).  - CT head (-) for acute pathology and his 2-D echocardiogram showing normal ejection fraction.  2. AMS/confusion - due to dementia and cognitive decline.  - no other focal infectious of metabolic source identified. Pt. Required some intermittent haldol for agitation while in the hospital. Will cont. Seroquel at bedtime and he has improved.   3. Acute on chronic renal failure-baseline creatinine around 1.4. Presented to the hospital with a creatinine of 2.0. - pt. Was hydrated with IV fluids and his Cr. Has improved and now back to baseline now.    4. Essential hypertension- BP stable. Was taken off Metoprolol due to some episodes of bradycardia but that has improved and now pt. Is tachy and som will resume Metoprolol upon discharge today.  - cont. Lisinopril, Hydralazine.   5. DM Type II - while in the hospital pt. Was on SSI but will resume Metformin upon discharged.   6.  Hyperlipidemia - He will cont. Simvastatin  7. Dementia - cont. Seroquel at bedtime.  Was given some PRN Haldol for agitation while in the hospital.  No episodes of agitation or sundowning in the past 2 days.   Since being home his family has noticed that he does not take any medication on his own. He does not answer his phone. He will take meds if supervised.  All his meals are provided by family - he eats about 60 % of the time.  He is not drinking any alcohol.  He is smoking about 1 ppd. He has wandered out of the apartment and locked himself out several times. He leaves the stove on. He is not performing personal hygiene - he changes clothes randomly. He is incontinent of urine and feces and does not clean up after himself. He is up at night - has called his daughter several times in the middle of the night. He is generally confused but not agitated or violent. He was discharged from [facility] primarily because it was only for rehab.  Home health has ended.  He has one more PT visit. His daughter Manuel Randolph has APS involved - they did a visit yesterday.  She has guardianship papers to complete.   He needs an FL-2 completed to pursue long term placement.   Diabetes  He presents for his follow-up diabetic visit. He has type 2 diabetes mellitus. Hypoglycemia symptoms include confusion. Pertinent negatives for hypoglycemia  include no nervousness/anxiousness. Associated symptoms include weakness (in left leg). Pertinent negatives for diabetes include no chest pain and no fatigue. Current diabetic treatment includes oral agent (monotherapy) (metformin).  Hypertension  This is a chronic problem. Pertinent negatives include no chest pain or shortness of breath. Past treatments include beta blockers, ACE inhibitors and direct vasodilators.  Rash  This is a chronic problem. Pertinent negatives include no fatigue, fever or shortness of breath. Past treatments include topical steroids.  Confusion - he is  oriented to person and sometimes to place.  He knows the year, we are on the second floor.  He can not name the president or provide his address.  Review of Systems  Constitutional: Negative for chills, fatigue and fever.  Respiratory: Negative for chest tightness and shortness of breath.   Cardiovascular: Negative for chest pain.  Genitourinary:       Incontinence of urine  Musculoskeletal: Positive for arthralgias (hip pain last weekend) and gait problem.  Skin: Positive for color change and rash.  Neurological: Positive for weakness (in left leg).  Psychiatric/Behavioral: Positive for confusion and sleep disturbance. Negative for agitation, behavioral problems and dysphoric mood. The patient is not nervous/anxious.     Patient Active Problem List   Diagnosis Date Noted  . Syncope 09/13/2016  . Dermatitis 03/01/2016  . Anemia, pernicious 07/04/2015  . Hip pain 07/04/2015  . Cardiac murmur 02/19/2015  . Chronic lymphocytic leukemia (Manuel Randolph) 02/19/2015  . DM type 2 with diabetic peripheral neuropathy (Manuel Randolph) 02/19/2015  . Essential (primary) hypertension 02/19/2015  . DM type 2 with diabetic mixed hyperlipidemia (Manuel Randolph) 02/19/2015  . Diabetic peripheral neuropathy associated with type 2 diabetes mellitus (Manuel Randolph) 02/19/2015  . Bleeding ulcer 02/19/2015  . Peripheral vascular disease (Manuel Randolph) 02/19/2015  . 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 Encounter for screening for malignant neoplasm of prostate 02/19/2015  . Type 2 diabetes mellitus with stage 3 chronic kidney disease (Manuel Randolph) 02/19/2015  . Compulsive tobacco user syndrome 02/19/2015  . 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  . Aortic valve defect 12/22/2012  . 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Manuel Randolph) 11/13/2011    Prior to Admission medications   Medication Sig Start Date End Date Taking? Authorizing Provider  aspirin 81 MG tablet Take 1 tablet (81 mg total) by mouth daily. 08/14/16  Yes  [name], MD  gabapentin (NEURONTIN) 300 MG capsule TAKE 1 CAPSULE BY MOUTH 3  TIMES DAILY 08/30/16  Yes [name], MD  glucose blood test strip Use as instructed 03/06/15  Yes [name], MD  hydrALAZINE (APRESOLINE) 50 MG tablet Take 1 tablet (50 mg total) by mouth every 8 (eight) hours. 09/18/16  Yes Sainani, Belia Heman, MD  Lancets 28G MISC 1 each by Does not apply route daily. 03/06/15  Yes [name], MD  lisinopril (PRINIVIL,ZESTRIL) 40 MG tablet Take 1 tablet (40 mg total) by mouth 2 (two) times daily. 10/24/15  Yes [name], MD  metFORMIN (GLUCOPHAGE) 500 MG tablet Take 1 tablet (500 mg total) by mouth 2 (two) times daily with a meal. Patient taking differently: Take 1,000 mg by mouth 2 (two) times daily with a meal.  04/16/16 04/16/17 Yes [name], MD  metoprolol tartrate (LOPRESSOR) 25 MG tablet Take 1 tablet (25 mg total) by mouth 2 (two) times daily. 03/18/16 03/18/17 Yes Schaevitz, Randall An, MD  QUEtiapine (SEROQUEL) 25 MG tablet Take 0.5 tablets (12.5 mg total) by mouth at bedtime. 09/18/16  Yes [place],  Belia Heman, MD  simvastatin (ZOCOR) 20 MG tablet TAKE 1 TABLET BY MOUTH AT  BEDTIME 08/30/16  Yes [name], MD  mupirocin ointment Drue Stager) 2 % Apply to affected area 3 times daily Patient not taking: Reported on 11/06/2016 08/14/16 08/14/17  [name], MD  triamcinolone cream (KENALOG) 0.1 % APPLY TO AFFECTED AREA TWICE A DAY Patient not taking: Reported on 11/06/2016 08/07/16   Daleen Bo, MD    No Known Allergies  Past Surgical History:  Procedure Laterality Date  . CATARACT EXTRACTION W/PHACO Left 09/13/2014   Procedure: CATARACT EXTRACTION PHACO AND INTRAOCULAR LENS PLACEMENT (IOC);  Surgeon: Birder Robson, MD;  Location: [facility] ORS;  Service: Ophthalmology;  Laterality: Left;  [place] 01:03   . COLONOSCOPY  2014  . POPLITEAL ARTERY ANGIOPLASTY Left 2014  . PTCA Right    leg  . UPPER GASTROINTESTINAL ENDOSCOPY  2014     Social History  Substance Use Topics  . Smoking status: Current Every Day Smoker    Packs/day: 0.50    Types: Cigarettes  . Smokeless tobacco: Never Used  . Alcohol use No     Medication list has been reviewed and updated.   Physical Exam  Constitutional: He appears well-developed. No distress.  Eyes: Pupils are equal, round, and reactive to light.  Neck: Normal range of motion. No thyromegaly present.  Cardiovascular: Normal rate and regular rhythm.   Murmur heard. Pulmonary/Chest: Effort normal and breath sounds normal. No respiratory distress. He has no wheezes.  Abdominal: Soft. Bowel sounds are normal.  Musculoskeletal: He exhibits no edema.  Neurological: He is alert. He is disoriented (oriented x 1). He displays no tremor. He exhibits normal muscle tone. Gait abnormal.  4/5 strength left hip flexors; mild left foot drag when walking  Psychiatric: His affect is blunt. His speech is delayed. He is slowed and withdrawn. He is not agitated. He exhibits abnormal recent memory.    BP 126/64   Pulse (!) 56   Ht 5\' 7"  (1.702 m)   Wt 143 lb 9.6 oz (65.1 kg)   SpO2 99%   BMI 22.49 kg/m   Assessment and Plan: 1. Essential (primary) hypertension Controlled - continue current medication  2. Type 2 diabetes mellitus with stage 3 chronic kidney disease, unspecified whether long term insulin use (HCC) stable  3. Dermatitis Apply cortisone cream bid - appears to be due to chronic excoriation or rubbing  4. Diabetic peripheral neuropathy associated with type 2 diabetes mellitus (HCC) Continue gabapentin  5. Cardiac murmur unchaged  6. Pain of left hip joint Resolved but will left sided leg weakness - Possible small CVA but improving quickly Continue aspirin; not a candidate for more aggressive anticoagulation Encourage use of walker regularly  7. Chronic lymphocytic leukemia ([place]) Followed by oncology  8. Small vessel disease, cerebrovascular Likely source of  confusion Will complete FL-2  9. Multiple lacunar infarcts [facility]) Noted on CT scan   Meds ordered this encounter  Medications  . hydrALAZINE (APRESOLINE) 50 MG tablet    Sig: Take 1 tablet (50 mg total) by mouth every 8 (eight) hours.    Dispense:  90 tablet    Refill:  1  . QUEtiapine (SEROQUEL) 25 MG tablet    Sig: Take 0.5 tablets (12.5 mg total) by mouth at bedtime.    Dispense:  15 tablet    Refill:  1    Please split pills for patient.    Halina Maidens, MD [facility]  11/06/2016

## 2016-11-06 NOTE — Patient Instructions (Signed)
FL-2 will be completed and will call you when ready.  Apply cortisone cream to arms and legs twice a day.  Continue current medications.

## 2016-11-13 ENCOUNTER — Emergency Department: Payer: Medicare Other

## 2016-11-13 ENCOUNTER — Inpatient Hospital Stay
Admission: EM | Admit: 2016-11-13 | Discharge: 2016-11-18 | DRG: 689 | Disposition: A | Discharge status: Skilled Nursing Facility | Payer: Medicare Other | Attending: Internal Medicine | Admitting: Internal Medicine

## 2016-11-13 DIAGNOSIS — N189 Chronic kidney disease, unspecified: Secondary | ICD-10-CM

## 2016-11-13 DIAGNOSIS — M6282 Rhabdomyolysis: Secondary | ICD-10-CM

## 2016-11-13 DIAGNOSIS — N390 Urinary tract infection, site not specified: Secondary | ICD-10-CM | POA: Diagnosis not present

## 2016-11-13 DIAGNOSIS — W19XXXA Unspecified fall, initial encounter: Secondary | ICD-10-CM | POA: Diagnosis present

## 2016-11-13 DIAGNOSIS — R4182 Altered mental status, unspecified: Secondary | ICD-10-CM

## 2016-11-13 DIAGNOSIS — E860 Dehydration: Secondary | ICD-10-CM

## 2016-11-13 DIAGNOSIS — E1122 Type 2 diabetes mellitus with diabetic chronic kidney disease: Secondary | ICD-10-CM | POA: Diagnosis present

## 2016-11-13 DIAGNOSIS — F0390 Unspecified dementia without behavioral disturbance: Secondary | ICD-10-CM | POA: Diagnosis present

## 2016-11-13 DIAGNOSIS — N183 Chronic kidney disease, stage 3 (moderate): Secondary | ICD-10-CM | POA: Diagnosis present

## 2016-11-13 DIAGNOSIS — I10 Essential (primary) hypertension: Secondary | ICD-10-CM

## 2016-11-13 DIAGNOSIS — N289 Disorder of kidney and ureter, unspecified: Secondary | ICD-10-CM

## 2016-11-13 DIAGNOSIS — N179 Acute kidney failure, unspecified: Secondary | ICD-10-CM

## 2016-11-13 DIAGNOSIS — Z7984 Long term (current) use of oral hypoglycemic drugs: Secondary | ICD-10-CM

## 2016-11-13 DIAGNOSIS — E875 Hyperkalemia: Secondary | ICD-10-CM | POA: Diagnosis not present

## 2016-11-13 DIAGNOSIS — I248 Other forms of acute ischemic heart disease: Secondary | ICD-10-CM | POA: Diagnosis not present

## 2016-11-13 DIAGNOSIS — Z833 Family history of diabetes mellitus: Secondary | ICD-10-CM

## 2016-11-13 DIAGNOSIS — I129 Hypertensive chronic kidney disease with stage 1 through stage 4 chronic kidney disease, or unspecified chronic kidney disease: Secondary | ICD-10-CM | POA: Diagnosis present

## 2016-11-13 DIAGNOSIS — I708 Atherosclerosis of other arteries: Secondary | ICD-10-CM | POA: Diagnosis not present

## 2016-11-13 DIAGNOSIS — E870 Hyperosmolality and hypernatremia: Secondary | ICD-10-CM | POA: Diagnosis not present

## 2016-11-13 DIAGNOSIS — Z856 Personal history of leukemia: Secondary | ICD-10-CM

## 2016-11-13 DIAGNOSIS — G9341 Metabolic encephalopathy: Secondary | ICD-10-CM | POA: Diagnosis present

## 2016-11-13 DIAGNOSIS — Z8673 Personal history of transient ischemic attack (TIA), and cerebral infarction without residual deficits: Secondary | ICD-10-CM

## 2016-11-13 DIAGNOSIS — I2489 Other forms of acute ischemic heart disease: Secondary | ICD-10-CM

## 2016-11-13 DIAGNOSIS — K219 Gastro-esophageal reflux disease without esophagitis: Secondary | ICD-10-CM | POA: Diagnosis present

## 2016-11-13 DIAGNOSIS — E785 Hyperlipidemia, unspecified: Secondary | ICD-10-CM | POA: Diagnosis present

## 2016-11-13 DIAGNOSIS — F17210 Nicotine dependence, cigarettes, uncomplicated: Secondary | ICD-10-CM | POA: Diagnosis present

## 2016-11-13 DIAGNOSIS — Z79899 Other long term (current) drug therapy: Secondary | ICD-10-CM

## 2016-11-13 DIAGNOSIS — E1165 Type 2 diabetes mellitus with hyperglycemia: Secondary | ICD-10-CM | POA: Diagnosis present

## 2016-11-13 LAB — CBC WITH DIFFERENTIAL/PLATELET
BASOS PCT: 1 %
Basophils Absolute: 0.1 10*3/uL (ref 0–0.1)
EOS ABS: 0 10*3/uL (ref 0–0.7)
EOS PCT: 0 %
HCT: 47.5 % (ref 40.0–52.0)
HEMOGLOBIN: 15.5 g/dL (ref 13.0–18.0)
LYMPHS ABS: 2.5 10*3/uL (ref 1.0–3.6)
Lymphocytes Relative: 22 %
MCH: 29.3 pg (ref 26.0–34.0)
MCHC: 32.6 g/dL (ref 32.0–36.0)
MCV: 90 fL (ref 80.0–100.0)
Monocytes Absolute: 0.7 10*3/uL (ref 0.2–1.0)
Monocytes Relative: 6 %
NEUTROS PCT: 71 %
Neutro Abs: 7.9 10*3/uL — ABNORMAL HIGH (ref 1.4–6.5)
Platelets: 225 10*3/uL (ref 150–440)
RBC: 5.28 MIL/uL (ref 4.40–5.90)
RDW: 15.3 % — ABNORMAL HIGH (ref 11.5–14.5)
WBC: 11.2 10*3/uL — AB (ref 3.8–10.6)

## 2016-11-13 LAB — URINALYSIS, ROUTINE W REFLEX MICROSCOPIC
Bilirubin Urine: NEGATIVE
Glucose, UA: 150 mg/dL — AB
Ketones, ur: 5 mg/dL — AB
Nitrite: NEGATIVE
Protein, ur: >=300 mg/dL — AB
SPECIFIC GRAVITY, URINE: 1.02 (ref 1.005–1.030)
pH: 5 (ref 5.0–8.0)

## 2016-11-13 LAB — COMPREHENSIVE METABOLIC PANEL
ALBUMIN: 4.3 g/dL (ref 3.5–5.0)
ALK PHOS: 105 U/L (ref 38–126)
ALT: 17 U/L (ref 17–63)
ANION GAP: 12 (ref 5–15)
AST: 36 U/L (ref 15–41)
BILIRUBIN TOTAL: 1.2 mg/dL (ref 0.3–1.2)
BUN: 35 mg/dL — AB (ref 6–20)
CO2: 22 mmol/L (ref 22–32)
Calcium: 9.9 mg/dL (ref 8.9–10.3)
Chloride: 105 mmol/L (ref 101–111)
Creatinine, Ser: 2.28 mg/dL — ABNORMAL HIGH (ref 0.61–1.24)
GFR calc Af Amer: 32 mL/min — ABNORMAL LOW (ref >60)
GFR calc non Af Amer: 28 mL/min — ABNORMAL LOW (ref >60)
GLUCOSE: 225 mg/dL — AB (ref 65–99)
POTASSIUM: 5.6 mmol/L — AB (ref 3.5–5.1)
SODIUM: 139 mmol/L (ref 135–145)
Total Protein: 8.8 g/dL — ABNORMAL HIGH (ref 6.5–8.1)

## 2016-11-13 LAB — PROTIME-INR
INR: 1.06
Prothrombin Time: 13.8 seconds (ref 11.4–15.2)

## 2016-11-13 LAB — LACTIC ACID, PLASMA: LACTIC ACID, VENOUS: 2 mmol/L — AB (ref 0.5–1.9)

## 2016-11-13 LAB — TROPONIN I: Troponin I: 0.05 ng/mL (ref <0.03)

## 2016-11-13 LAB — CK: CK TOTAL: 1733 U/L — AB (ref 49–397)

## 2016-11-13 MED ORDER — CEFTRIAXONE SODIUM 1 G IJ SOLR
1.0000 g | INTRAMUSCULAR | Status: AC
  Administered 2016-11-13: 1 g via INTRAVENOUS
  Filled 2016-11-13: qty 10

## 2016-11-13 MED ORDER — SODIUM CHLORIDE 0.9 % IV BOLUS (SEPSIS): 250.0000 mL | Freq: Once | INTRAVENOUS | Status: DC

## 2016-11-13 MED ORDER — SODIUM CHLORIDE 0.9 % IV BOLUS (SEPSIS)
1000.0000 mL | INTRAVENOUS | Status: AC
  Administered 2016-11-13: 1000 mL via INTRAVENOUS

## 2016-11-13 MED ORDER — SODIUM CHLORIDE 0.9 % IV BOLUS (SEPSIS): 500.0000 mL | INTRAVENOUS | Status: DC

## 2016-11-13 MED ORDER — SODIUM CHLORIDE 0.9 % IV BOLUS (SEPSIS)
1000.0000 mL | Freq: Once | INTRAVENOUS | Status: AC
  Administered 2016-11-13: 1000 mL via INTRAVENOUS

## 2016-11-13 MED ORDER — SODIUM CHLORIDE 0.9 % IV BOLUS (SEPSIS): 1000.0000 mL | Freq: Once | INTRAVENOUS | Status: DC

## 2016-11-13 NOTE — ED Provider Notes (Addendum)
Clinical Course as of Nov 13 2336  Wed Nov 13, 2016  2147 Assuming care from Dr. Cherylann Banas.  In short, Manuel Randolph. is a 71 y.o. male with a chief complaint of being found down and minimally responsive.  Refer to the original H&P for additional details.  The current plan of care is to follow up remaining labs and admit.   [CF]  2234 [facility] called about antibiotics.  Dr. Cherylann Banas did not feel the patient was septic/infectious, but we do not yet have a urine.  I ordered a cancellation of code sepsis but [place] (ED RN) is proceeding with in and out catheter to check UA.  If positive, we will give antibiotics, but the lactic acid of 2 is likely volume related.  Patient has a minimal leukocytosis of 11.2, no tachycardia, elevated BP(rather than hypotension), and evidence fo AKI and rhabdo.  Holding empiric abx for now at least until UA sample is obtained.  [CF]  2310 Probable positive UTI but the patient is hemodynamically stable and there is also a component of rhabdomyolysis.  I will give ceftriaxone and admit the patient further management of his multiple medical issues but I am not concerned about severe sepsis and certainly not about septic shock at this time.  [CF]  2338 Spoke with Dr. Ara Kussmaul who will admit.  Patient hemodynamically stable.  [CF]    Clinical Course User Index [CF] Hinda Kehr, MD   Final diagnoses:  Altered mental status, unspecified altered mental status type  Acute on chronic renal insufficiency  Dehydration  Hyperkalemia  AKI (acute kidney injury) ([place])  Non-traumatic rhabdomyolysis  Demand ischemia ([place])  Urinary tract infection without hematuria, site unspecified       Hinda Kehr, MD 11/13/16 2633    Hinda Kehr, MD 11/13/16 [id number]

## 2016-11-13 NOTE — ED Notes (Signed)
Date and time results received: 11/13/16 2136 (use smartphrase ".now" to insert current time)  Test: Lactic Acid Critical Value: 2.0  Name of Provider Notified: Dr. Cherylann Banas  Orders Received? Or Actions Taken?:

## 2016-11-13 NOTE — ED Triage Notes (Signed)
Pt arrived to hospital via EMS from home with Altered Mental Status/Fall (baseline is confused/Dementia). EMS stated that daughter found in kitchen floor today. Last time she saw him was on Monday. VS per EMS were BP-89/63 BG-309 HR-"normal". Pt has Hx of Dementia, Diabetes, heart murmur, HTN, leukemia, high cholesterol, mini strokes, and Left leg has had vascular surgery in the past. EMS inserted an IV 20 gauge left hand. Daughter is at the bedside. She states he has no living will and that she is trying to get him placed into a facility.

## 2016-11-13 NOTE — ED Provider Notes (Signed)
[facility] Emergency Department Provider Note ____________________________________________   First MD Initiated Contact with Patient 11/13/16 1937     (approximate)  I have reviewed the triage vital signs and the nursing notes.   HISTORY  Chief Complaint Altered Mental Status  HPI severely limited by altered mental status  HPI Manuel WAINWRIGHT Sr. is a 71 y.o. male History of dementia who presents with altered mental status, unclear duration.Per daughter pt is confused at baseline but lives alone with frequent checks by family members.  He was last seen at his baseline on Monday but nobody was able to see him yesterday.  Today he was found on the floor in his home, confused and minimally responsive.  Patient is unable to provide any hx.   Past Medical History:  Diagnosis Date  . Anemia   . Diabetes ([place])   . GERD (gastroesophageal reflux disease)   . Hyperlipidemia   . Hypertension   . Leukemia [facility])     Patient Active Problem List   Diagnosis Date Noted  . Multiple lacunar infarcts ([place]) 11/06/2016  . Small vessel disease, cerebrovascular 11/06/2016  . Syncope 09/13/2016  . Dermatitis 03/01/2016  . Anemia, pernicious 07/04/2015  . Hip pain 07/04/2015  . Aortic valve sclerosis 02/19/2015  . Chronic lymphocytic leukemia ([place]) 02/19/2015  . DM type 2 with diabetic peripheral neuropathy ([place]) 02/19/2015  . Essential (primary) hypertension 02/19/2015  . DM type 2 with diabetic mixed hyperlipidemia ([place]) 02/19/2015  . Diabetic peripheral neuropathy associated with type 2 diabetes mellitus ([place]) 02/19/2015  . Bleeding ulcer 02/19/2015  . Peripheral vascular disease ([place]) 02/19/2015  . 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 Encounter for screening for malignant neoplasm of prostate 02/19/2015  . Type 2 diabetes mellitus with stage 3 chronic kidney disease ([place]) 02/19/2015  . Compulsive tobacco user syndrome 02/19/2015  .  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  . Aortic valve defect 12/22/2012  . 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[place]) 11/13/2011    Past Surgical History:  Procedure Laterality Date  . CATARACT EXTRACTION W/PHACO Left 09/13/2014   Procedure: CATARACT EXTRACTION PHACO AND INTRAOCULAR LENS PLACEMENT (IOC);  Surgeon: Birder Robson, MD;  Location: [facility] ORS;  Service: Ophthalmology;  Laterality: Left;  [place] 01:03   . COLONOSCOPY  2014  . POPLITEAL ARTERY ANGIOPLASTY Left 2014  . PTCA Right    leg  . UPPER GASTROINTESTINAL ENDOSCOPY  2014    Prior to Admission medications   Medication Sig Start Date End Date Taking? Authorizing Provider  aspirin 81 MG tablet Take 1 tablet (81 mg total) by mouth daily. 08/14/16   [name], MD  gabapentin (NEURONTIN) 300 MG capsule TAKE 1 CAPSULE BY MOUTH 3  TIMES DAILY 08/30/16   [name], MD  glucose blood test strip Use as instructed 03/06/15   [name], MD  hydrALAZINE (APRESOLINE) 50 MG tablet Take 1 tablet (50 mg total) by mouth every 8 (eight) hours. 11/06/16   [name], MD  Lancets 28G MISC 1 each by Does not apply route daily. 03/06/15   [name], MD  lisinopril (PRINIVIL,ZESTRIL) 40 MG tablet Take 1 tablet (40 mg total) by mouth 2 (two) times daily. 10/24/15   [name], MD  metFORMIN (GLUCOPHAGE) 500 MG tablet Take 1 tablet (500 mg total) by mouth 2 (two) times daily with a meal. Patient taking differently: Take 1,000 mg by mouth 2 (two) times daily with a meal.  04/16/16 04/16/17  [name], MD  metoprolol tartrate (LOPRESSOR) 25 MG tablet Take 1 tablet (25 mg total) by mouth 2 (two) times daily. 03/18/16 03/18/17  Orbie Pyo, MD  mupirocin ointment Drue Stager) 2 % Apply to affected area 3 times daily Patient not taking: Reported on 11/06/2016 08/14/16 08/14/17  [name], MD  QUEtiapine (SEROQUEL) 25 MG tablet Take 0.5 tablets (12.5 mg total) by mouth  at bedtime. 11/06/16   [name], MD  simvastatin (ZOCOR) 20 MG tablet TAKE 1 TABLET BY MOUTH AT  BEDTIME 08/30/16   [name], MD  triamcinolone cream (KENALOG) 0.1 % APPLY TO AFFECTED AREA TWICE A DAY Patient not taking: Reported on 11/06/2016 08/07/16   Daleen Bo, MD    Allergies Patient has no known allergies.  Family History  Problem Relation Age of Onset  . Diabetes Mother   . Diabetes Father     Social History Social History  Substance Use Topics  . Smoking status: Current Every Day Smoker    Packs/day: 0.50    Types: Cigarettes  . Smokeless tobacco: Never Used  . Alcohol use No    Review of Systems Level V caveat: unable to complete review of systems due to dementia and AMS    ____________________________________________   PHYSICAL EXAM:  VITAL SIGNS: ED Triage Vitals  Enc Vitals Group     BP 11/13/16 1945 (!) 157/80     Pulse Rate 11/13/16 1945 89     Resp 11/13/16 1945 (!) 27     Temp 11/13/16 1945 98.7 F (37.1 C)     Temp Source 11/13/16 1945 Oral     SpO2 11/13/16 1945 100 %     Weight --      Height 11/13/16 1940 5\' 8"  (1.727 m)     Head Circumference --      Peak Flow --      Pain Score --      Pain Loc --      Pain Edu? --      Excl. in [place]? --     Constitutional: Alert, disoriented.  Weak appearing. Eyes: Conjunctivae are normal.  Head: Atraumatic. Nose: No congestion/rhinnorhea. Mouth/Throat: Mucous membranes are moist.   Neck: Normal range of motion.  Cardiovascular: Normal rate, regular rhythm. Grossly normal heart sounds.  Good peripheral circulation. Respiratory: Normal respiratory effort.  No retractions. Slightly dec breath sounds bilat.  Gastrointestinal: Soft and nontender. No distention.  Genitourinary: No CVA tenderness. Musculoskeletal: No lower extremity edema.  Extremities warm and well perfused.  Neurologic:  Normal speech and language. No gross focal neurologic deficits are appreciated. Follows commands.    Skin:  Skin is warm and dry. No rash noted. No erythema or induration.  Psychiatric: Unable to assess.   ____________________________________________   LABS (all labs ordered are listed, but only abnormal results are displayed)  Labs Reviewed  CBC WITH DIFFERENTIAL/PLATELET - Abnormal; Notable for the following:       Result Value   WBC 11.2 (*)    RDW 15.3 (*)    Neutro Abs 7.9 (*)    All other components within normal limits  LACTIC ACID, PLASMA - Abnormal; Notable for the following:    Lactic Acid, Venous 2.0 (*)    All other components within normal limits  CK - Abnormal; Notable for the following:    Total CK 1,733 (*)    All other components within normal limits  COMPREHENSIVE METABOLIC PANEL - Abnormal; Notable for  the following:    Potassium 5.6 (*)    Glucose, Bld 225 (*)    BUN 35 (*)    Creatinine, Ser 2.28 (*)    Total Protein 8.8 (*)    GFR calc non Af Amer 28 (*)    GFR calc Af Amer 32 (*)    All other components within normal limits  TROPONIN I - Abnormal; Notable for the following:    Troponin I 0.05 (*)    All other components within normal limits  CULTURE, BLOOD (ROUTINE X 2)  CULTURE, BLOOD (ROUTINE X 2)  URINALYSIS, ROUTINE W REFLEX MICROSCOPIC  PROTIME-INR   ____________________________________________  EKG  ED ECG REPORT I, Arta Silence, the attending physician, personally viewed and interpreted this ECG.  Date: 11/13/2016 EKG Time: 19:43 Rate: 97 Rhythm: normal sinus rhythm QRS Axis: normal Intervals: Right bundle-branch block ST/T Wave abnormalities: normal Narrative Interpretation: no acute findings.  ____________________________________________  AJOINOMVE    ____________________________________________   PROCEDURES  Procedure(s) performed: No    Critical Care performed: CRITICAL CARE Performed by: Arta Silence   Total critical care time: 30 minutes  Critical care time was exclusive of separately  billable procedures and treating other patients.  Critical care was necessary to treat or prevent imminent or life-threatening deterioration.  Critical care was time spent personally by me on the following activities: development of treatment plan with patient and/or surrogate as well as nursing, discussions with consultants, evaluation of patient's response to treatment, examination of patient, obtaining history from patient or surrogate, ordering and performing treatments and interventions, ordering and review of laboratory studies, ordering and review of radiographic studies, pulse oximetry and re-evaluation of patient's condition.  ____________________________________________   INITIAL IMPRESSION / ASSESSMENT AND PLAN / ED COURSE  Pertinent labs & imaging results that were available during my care of the patient were reviewed by me and considered in my medical decision making (see chart for details).  71 year old male, history dementia and other PMH as noted presents with altered mental status after he was found down in his home today. Last seen at baseline 2 days ago. Per EMS patient hypotensive in the field, however vital signs stabilized in ED. Exam reveals weak and chronically ill-appearing patient, but no focal acute findings. Overall differential includes sepsis, dehydration or other metabolic cause, acute CNS cause, less likely cardiac. Plan for sepsis/infection workup, cardiac labs, IV fluids, CT head, and reassess. Anticipate admission. Daughter sta but patient so far has been still living alone.  Clinical Course as of Nov 14 2202  Wed Nov 13, 2016  2147 Assuming care from Dr. Cherylann Banas.  In short, Manuel Balles. is a 71 y.o. male with a chief complaint of being found down and minimally responsive.  Refer to the original H&P for additional details.  The current plan of care is to follow up remaining labs and admit.   [CF]    Clinical Course User Index [CF] Hinda Kehr, MD      ____________________________________________   FINAL CLINICAL IMPRESSION(S) / ED DIAGNOSES  Final diagnoses:  Altered mental status, unspecified altered mental status type      NEW MEDICATIONS STARTED DURING THIS VISIT:  New Prescriptions   No medications on file     Note:  This document was prepared using Dragon voice recognition software and may include unintentional dictation errors.    Arta Silence, MD 11/13/16 2204

## 2016-11-13 NOTE — ED Notes (Signed)
Date and time results received: 11/13/16 2144 (use smartphrase ".now" to insert current time)  Test: troponin Critical Value: 0.05  Name of Provider Notified: Dr. Cherylann Banas  Orders Received? Or Actions Taken?:

## 2016-11-14 DIAGNOSIS — W19XXXA Unspecified fall, initial encounter: Secondary | ICD-10-CM | POA: Diagnosis present

## 2016-11-14 DIAGNOSIS — M6281 Muscle weakness (generalized): Secondary | ICD-10-CM | POA: Diagnosis not present

## 2016-11-14 DIAGNOSIS — F0390 Unspecified dementia without behavioral disturbance: Secondary | ICD-10-CM | POA: Diagnosis present

## 2016-11-14 DIAGNOSIS — E1122 Type 2 diabetes mellitus with diabetic chronic kidney disease: Secondary | ICD-10-CM | POA: Diagnosis present

## 2016-11-14 DIAGNOSIS — R2681 Unsteadiness on feet: Secondary | ICD-10-CM | POA: Diagnosis not present

## 2016-11-14 DIAGNOSIS — E784 Other hyperlipidemia: Secondary | ICD-10-CM | POA: Diagnosis not present

## 2016-11-14 DIAGNOSIS — I10 Essential (primary) hypertension: Secondary | ICD-10-CM | POA: Diagnosis not present

## 2016-11-14 DIAGNOSIS — E0821 Diabetes mellitus due to underlying condition with diabetic nephropathy: Secondary | ICD-10-CM | POA: Diagnosis not present

## 2016-11-14 DIAGNOSIS — Z8673 Personal history of transient ischemic attack (TIA), and cerebral infarction without residual deficits: Secondary | ICD-10-CM | POA: Diagnosis not present

## 2016-11-14 DIAGNOSIS — N2889 Other specified disorders of kidney and ureter: Secondary | ICD-10-CM | POA: Diagnosis not present

## 2016-11-14 DIAGNOSIS — E785 Hyperlipidemia, unspecified: Secondary | ICD-10-CM | POA: Diagnosis present

## 2016-11-14 DIAGNOSIS — E1165 Type 2 diabetes mellitus with hyperglycemia: Secondary | ICD-10-CM | POA: Diagnosis present

## 2016-11-14 DIAGNOSIS — R41841 Cognitive communication deficit: Secondary | ICD-10-CM | POA: Diagnosis not present

## 2016-11-14 DIAGNOSIS — K219 Gastro-esophageal reflux disease without esophagitis: Secondary | ICD-10-CM | POA: Diagnosis present

## 2016-11-14 DIAGNOSIS — G9341 Metabolic encephalopathy: Secondary | ICD-10-CM | POA: Diagnosis present

## 2016-11-14 DIAGNOSIS — L89899 Pressure ulcer of other site, unspecified stage: Secondary | ICD-10-CM | POA: Diagnosis not present

## 2016-11-14 DIAGNOSIS — E860 Dehydration: Secondary | ICD-10-CM | POA: Diagnosis present

## 2016-11-14 DIAGNOSIS — R4182 Altered mental status, unspecified: Secondary | ICD-10-CM | POA: Diagnosis not present

## 2016-11-14 DIAGNOSIS — I129 Hypertensive chronic kidney disease with stage 1 through stage 4 chronic kidney disease, or unspecified chronic kidney disease: Secondary | ICD-10-CM | POA: Diagnosis present

## 2016-11-14 DIAGNOSIS — E870 Hyperosmolality and hypernatremia: Secondary | ICD-10-CM | POA: Diagnosis not present

## 2016-11-14 DIAGNOSIS — N390 Urinary tract infection, site not specified: Secondary | ICD-10-CM | POA: Diagnosis present

## 2016-11-14 DIAGNOSIS — Z833 Family history of diabetes mellitus: Secondary | ICD-10-CM | POA: Diagnosis not present

## 2016-11-14 DIAGNOSIS — N183 Chronic kidney disease, stage 3 (moderate): Secondary | ICD-10-CM | POA: Diagnosis present

## 2016-11-14 DIAGNOSIS — Z5189 Encounter for other specified aftercare: Secondary | ICD-10-CM | POA: Diagnosis not present

## 2016-11-14 DIAGNOSIS — Z856 Personal history of leukemia: Secondary | ICD-10-CM | POA: Diagnosis not present

## 2016-11-14 DIAGNOSIS — E875 Hyperkalemia: Secondary | ICD-10-CM | POA: Diagnosis present

## 2016-11-14 DIAGNOSIS — M6282 Rhabdomyolysis: Secondary | ICD-10-CM | POA: Diagnosis present

## 2016-11-14 DIAGNOSIS — E1121 Type 2 diabetes mellitus with diabetic nephropathy: Secondary | ICD-10-CM | POA: Diagnosis not present

## 2016-11-14 DIAGNOSIS — F0151 Vascular dementia with behavioral disturbance: Secondary | ICD-10-CM | POA: Diagnosis not present

## 2016-11-14 DIAGNOSIS — Z79899 Other long term (current) drug therapy: Secondary | ICD-10-CM | POA: Diagnosis not present

## 2016-11-14 DIAGNOSIS — N179 Acute kidney failure, unspecified: Secondary | ICD-10-CM | POA: Diagnosis present

## 2016-11-14 DIAGNOSIS — Z7984 Long term (current) use of oral hypoglycemic drugs: Secondary | ICD-10-CM | POA: Diagnosis not present

## 2016-11-14 DIAGNOSIS — I248 Other forms of acute ischemic heart disease: Secondary | ICD-10-CM | POA: Diagnosis present

## 2016-11-14 DIAGNOSIS — Z7401 Bed confinement status: Secondary | ICD-10-CM | POA: Diagnosis not present

## 2016-11-14 DIAGNOSIS — N189 Chronic kidney disease, unspecified: Secondary | ICD-10-CM | POA: Diagnosis present

## 2016-11-14 DIAGNOSIS — F17210 Nicotine dependence, cigarettes, uncomplicated: Secondary | ICD-10-CM | POA: Diagnosis present

## 2016-11-14 LAB — COMPREHENSIVE METABOLIC PANEL
ALBUMIN: 3.3 g/dL — AB (ref 3.5–5.0)
ALT: 14 U/L — ABNORMAL LOW (ref 17–63)
ANION GAP: 7 (ref 5–15)
AST: 30 U/L (ref 15–41)
Alkaline Phosphatase: 75 U/L (ref 38–126)
BILIRUBIN TOTAL: 0.7 mg/dL (ref 0.3–1.2)
BUN: 35 mg/dL — ABNORMAL HIGH (ref 6–20)
CHLORIDE: 114 mmol/L — AB (ref 101–111)
CO2: 21 mmol/L — ABNORMAL LOW (ref 22–32)
Calcium: 8.4 mg/dL — ABNORMAL LOW (ref 8.9–10.3)
Creatinine, Ser: 1.65 mg/dL — ABNORMAL HIGH (ref 0.61–1.24)
GFR calc Af Amer: 47 mL/min — ABNORMAL LOW (ref >60)
GFR, EST NON AFRICAN AMERICAN: 41 mL/min — AB (ref >60)
GLUCOSE: 182 mg/dL — AB (ref 65–99)
POTASSIUM: 4.3 mmol/L (ref 3.5–5.1)
Sodium: 142 mmol/L (ref 135–145)
TOTAL PROTEIN: 6.5 g/dL (ref 6.5–8.1)

## 2016-11-14 LAB — TSH: TSH: 2.237 u[IU]/mL (ref 0.350–4.500)

## 2016-11-14 LAB — TROPONIN I
TROPONIN I: 0.06 ng/mL — AB (ref <0.03)
Troponin I: 0.06 ng/mL (ref <0.03)
Troponin I: 0.04 ng/mL (ref <0.03)

## 2016-11-14 LAB — CBC
HCT: 37.7 % — ABNORMAL LOW (ref 40.0–52.0)
Hemoglobin: 12.4 g/dL — ABNORMAL LOW (ref 13.0–18.0)
MCH: 29.6 pg (ref 26.0–34.0)
MCHC: 32.9 g/dL (ref 32.0–36.0)
MCV: 90 fL (ref 80.0–100.0)
PLATELETS: 156 10*3/uL (ref 150–440)
RBC: 4.19 MIL/uL — AB (ref 4.40–5.90)
RDW: 15.1 % — AB (ref 11.5–14.5)
WBC: 10.8 10*3/uL — AB (ref 3.8–10.6)

## 2016-11-14 LAB — GLUCOSE, CAPILLARY
GLUCOSE-CAPILLARY: 152 mg/dL — AB (ref 65–99)
GLUCOSE-CAPILLARY: 132 mg/dL — AB (ref 65–99)
Glucose-Capillary: 175 mg/dL — ABNORMAL HIGH (ref 65–99)
Glucose-Capillary: 123 mg/dL — ABNORMAL HIGH (ref 65–99)
Glucose-Capillary: 168 mg/dL — ABNORMAL HIGH (ref 65–99)

## 2016-11-14 LAB — BLOOD CULTURE ID PANEL (REFLEXED)
ACINETOBACTER BAUMANNII: NOT DETECTED
CANDIDA ALBICANS: NOT DETECTED
Candida glabrata: NOT DETECTED
Candida krusei: NOT DETECTED
Candida parapsilosis: NOT DETECTED
Candida tropicalis: NOT DETECTED
ENTEROBACTERIACEAE SPECIES: NOT DETECTED
Enterobacter cloacae complex: NOT DETECTED
Enterococcus species: NOT DETECTED
Escherichia coli: NOT DETECTED
HAEMOPHILUS INFLUENZAE: NOT DETECTED
Klebsiella oxytoca: NOT DETECTED
Klebsiella pneumoniae: NOT DETECTED
Listeria monocytogenes: NOT DETECTED
METHICILLIN RESISTANCE: DETECTED — AB
NEISSERIA MENINGITIDIS: NOT DETECTED
PSEUDOMONAS AERUGINOSA: NOT DETECTED
Proteus species: NOT DETECTED
STREPTOCOCCUS PNEUMONIAE: NOT DETECTED
STREPTOCOCCUS PYOGENES: NOT DETECTED
STREPTOCOCCUS SPECIES: NOT DETECTED
Serratia marcescens: NOT DETECTED
Staphylococcus aureus (BCID): NOT DETECTED
Staphylococcus species: DETECTED — AB
Streptococcus agalactiae: NOT DETECTED

## 2016-11-14 LAB — CK: CK TOTAL: 1453 U/L — AB (ref 49–397)

## 2016-11-14 LAB — PHOSPHORUS: Phosphorus: 3.9 mg/dL (ref 2.5–4.6)

## 2016-11-14 LAB — MAGNESIUM: MAGNESIUM: 1.9 mg/dL (ref 1.7–2.4)

## 2016-11-14 MED ORDER — ASPIRIN EC 81 MG PO TBEC
81.0000 mg | DELAYED_RELEASE_TABLET | Freq: Every day | ORAL | Status: DC
  Administered 2016-11-15 – 2016-11-18 (×4): 81 mg via ORAL
  Filled 2016-11-14 (×4): qty 1

## 2016-11-14 MED ORDER — SODIUM CHLORIDE 0.9% FLUSH
3.0000 mL | Freq: Two times a day (BID) | INTRAVENOUS | Status: DC
  Administered 2016-11-14 – 2016-11-18 (×7): 3 mL via INTRAVENOUS

## 2016-11-14 MED ORDER — MAGNESIUM CITRATE PO SOLN
1.0000 | Freq: Once | ORAL | Status: DC | PRN
  Filled 2016-11-14: qty 296

## 2016-11-14 MED ORDER — GABAPENTIN 300 MG PO CAPS
300.0000 mg | ORAL_CAPSULE | Freq: Three times a day (TID) | ORAL | Status: DC
  Administered 2016-11-14 – 2016-11-18 (×12): 300 mg via ORAL
  Filled 2016-11-14 (×11): qty 1

## 2016-11-14 MED ORDER — ONDANSETRON HCL 4 MG/2ML IJ SOLN: 4.0000 mg | Freq: Four times a day (QID) | INTRAMUSCULAR | Status: DC | PRN

## 2016-11-14 MED ORDER — ALBUTEROL SULFATE (2.5 MG/3ML) 0.083% IN NEBU: 2.5000 mg | INHALATION_SOLUTION | Freq: Four times a day (QID) | RESPIRATORY_TRACT | Status: DC | PRN

## 2016-11-14 MED ORDER — SODIUM CHLORIDE 0.9 % IV SOLN
INTRAVENOUS | Status: DC
  Administered 2016-11-14 – 2016-11-15 (×3): via INTRAVENOUS

## 2016-11-14 MED ORDER — ACETAMINOPHEN 325 MG PO TABS: 650.0000 mg | ORAL_TABLET | Freq: Four times a day (QID) | ORAL | Status: DC | PRN

## 2016-11-14 MED ORDER — METOPROLOL TARTRATE 25 MG PO TABS
25.0000 mg | ORAL_TABLET | Freq: Two times a day (BID) | ORAL | Status: DC
  Administered 2016-11-14 – 2016-11-16 (×5): 25 mg via ORAL
  Filled 2016-11-14 (×4): qty 1

## 2016-11-14 MED ORDER — INSULIN ASPART 100 UNIT/ML ~~LOC~~ SOLN
0.0000 [IU] | Freq: Every day | SUBCUTANEOUS | Status: DC
  Administered 2016-11-17: 2 [IU] via SUBCUTANEOUS

## 2016-11-14 MED ORDER — BISACODYL 5 MG PO TBEC
5.0000 mg | DELAYED_RELEASE_TABLET | Freq: Every day | ORAL | Status: DC | PRN
  Administered 2016-11-16: 5 mg via ORAL
  Filled 2016-11-14: qty 1

## 2016-11-14 MED ORDER — SIMVASTATIN 20 MG PO TABS
20.0000 mg | ORAL_TABLET | Freq: Every day | ORAL | Status: DC
  Administered 2016-11-14 – 2016-11-17 (×5): 20 mg via ORAL
  Filled 2016-11-14 (×4): qty 1

## 2016-11-14 MED ORDER — ONDANSETRON HCL 4 MG PO TABS: 4.0000 mg | ORAL_TABLET | Freq: Four times a day (QID) | ORAL | Status: DC | PRN

## 2016-11-14 MED ORDER — SENNOSIDES-DOCUSATE SODIUM 8.6-50 MG PO TABS: 1.0000 | ORAL_TABLET | Freq: Every evening | ORAL | Status: DC | PRN

## 2016-11-14 MED ORDER — ACETAMINOPHEN 650 MG RE SUPP: 650.0000 mg | Freq: Four times a day (QID) | RECTAL | Status: DC | PRN

## 2016-11-14 MED ORDER — ENOXAPARIN SODIUM 40 MG/0.4ML ~~LOC~~ SOLN
40.0000 mg | SUBCUTANEOUS | Status: DC
  Administered 2016-11-14 – 2016-11-18 (×5): 40 mg via SUBCUTANEOUS
  Filled 2016-11-14 (×4): qty 0.4

## 2016-11-14 MED ORDER — HYDRALAZINE HCL 50 MG PO TABS
50.0000 mg | ORAL_TABLET | Freq: Three times a day (TID) | ORAL | Status: DC
  Administered 2016-11-14 – 2016-11-18 (×11): 50 mg via ORAL
  Filled 2016-11-14 (×11): qty 1

## 2016-11-14 MED ORDER — QUETIAPINE FUMARATE 25 MG PO TABS
12.5000 mg | ORAL_TABLET | Freq: Every day | ORAL | Status: DC
  Administered 2016-11-14 – 2016-11-17 (×5): 12.5 mg via ORAL
  Filled 2016-11-14 (×4): qty 1

## 2016-11-14 MED ORDER — DEXTROSE 5 % IV SOLN
1.0000 g | INTRAVENOUS | Status: AC
  Administered 2016-11-14 – 2016-11-15 (×2): 1 g via INTRAVENOUS
  Filled 2016-11-14 (×2): qty 10

## 2016-11-14 MED ORDER — INSULIN ASPART 100 UNIT/ML ~~LOC~~ SOLN
0.0000 [IU] | Freq: Three times a day (TID) | SUBCUTANEOUS | Status: DC
  Administered 2016-11-14 (×2): 1 [IU] via SUBCUTANEOUS
  Administered 2016-11-14 – 2016-11-15 (×2): 2 [IU] via SUBCUTANEOUS
  Administered 2016-11-15: 1 [IU] via SUBCUTANEOUS
  Administered 2016-11-15: 3 [IU] via SUBCUTANEOUS
  Administered 2016-11-16 (×3): 2 [IU] via SUBCUTANEOUS
  Administered 2016-11-17: 1 [IU] via SUBCUTANEOUS
  Administered 2016-11-17: 2 [IU] via SUBCUTANEOUS
  Administered 2016-11-17: 3 [IU] via SUBCUTANEOUS
  Administered 2016-11-18: 7 [IU] via SUBCUTANEOUS
  Administered 2016-11-18: 2 [IU] via SUBCUTANEOUS
  Filled 2016-11-14 (×13): qty 1

## 2016-11-14 MED ORDER — IPRATROPIUM BROMIDE 0.02 % IN SOLN: 0.5000 mg | Freq: Four times a day (QID) | RESPIRATORY_TRACT | Status: DC | PRN

## 2016-11-14 NOTE — Progress Notes (Signed)
PT Cancellation Note  Patient Details Name: Manuel DOUGAL Sr. MRN: [id number] DOB: 12/24/1945   Cancelled Treatment:    Reason Eval/Treat Not Completed: Fatigue/lethargy limiting ability to participate (Consult received and chart reviewed. Evaluation attempted, but patient unable to maintain alertness for participation with session.  Mumbles intermittently, but does not open eyes or actively move extremities despite max cuing, sternal rub. Will re-attempt at later time/date as patient alert and able to actively participate.)   Dean Wonder H. Owens Shark, PT, DPT, NCS 11/14/16, 9:19 AM [phone]

## 2016-11-14 NOTE — Progress Notes (Signed)
Admission incomplete d/t pt. Is very confused with h/o dementia,

## 2016-11-14 NOTE — Progress Notes (Signed)
[facility] [place] NAME: Manuel Randolph    MR#:  [id number]  DATE OF BIRTH:  [birth year]  SUBJECTIVE:   Patient here due to altered mental status and being found down by his daughter. Noted to be in acute kidney injury with urinary tract infection and also rhabdomyolysis. Patient is a bit more awake today. CKs are trending down, renal function improving.  REVIEW OF SYSTEMS:    Review of Systems  Unable to perform ROS: Mental acuity    Nutrition: Heart Healthy/Carb control Tolerating Diet: Yes Tolerating PT: Await Eval.   DRUG ALLERGIES:  No Known Allergies  VITALS:  Blood pressure (!) 146/59, pulse 67, temperature 98.3 F (36.8 C), temperature source Oral, resp. rate 20, height 5\' 8"  (1.727 m), SpO2 100 %.  PHYSICAL EXAMINATION:   Physical Exam  GENERAL:  71 y.o.-year-old patient sitting up in bed eating lunch but in NAD.   EYES: Pupils equal, round, reactive to light and accommodation. No scleral icterus. Extraocular muscles intact.  HEENT: Head atraumatic, normocephalic. Oropharynx and nasopharynx clear.  NECK:  Supple, no jugular venous distention. No thyroid enlargement, no tenderness.  LUNGS: Normal breath sounds bilaterally, no wheezing, rales, rhonchi. No use of accessory muscles of respiration.  CARDIOVASCULAR: S1, S2 normal. No murmurs, rubs, or gallops.  ABDOMEN: Soft, nontender, nondistended. Bowel sounds present. No organomegaly or mass.  EXTREMITIES: No cyanosis, clubbing or edema b/l.    NEUROLOGIC: Cranial nerves II through XII are intact. No focal Motor or sensory deficits b/l. Globally weak.   PSYCHIATRIC: The patient is alert and oriented x 1.   SKIN: No obvious rash, lesion, or ulcer.    LABORATORY PANEL:   CBC  Recent Labs Lab 11/14/16 0553  WBC 10.8*  HGB 12.4*  HCT 37.7*  PLT 156    ------------------------------------------------------------------------------------------------------------------  Chemistries   Recent Labs Lab 11/14/16 0132 11/14/16 0553  NA  --  142  K  --  4.3  CL  --  114*  CO2  --  21*  GLUCOSE  --  182*  BUN  --  35*  CREATININE  --  1.65*  CALCIUM  --  8.4*  MG 1.9  --   AST  --  30  ALT  --  14*  ALKPHOS  --  75  BILITOT  --  0.7   ------------------------------------------------------------------------------------------------------------------  Cardiac Enzymes  Recent Labs Lab 11/14/16 0724  TROPONINI 0.06*   ------------------------------------------------------------------------------------------------------------------  RADIOLOGY:  Ct Head Wo Contrast  Result Date: 11/13/2016 CLINICAL DATA:  Altered mental status EXAM: CT HEAD WITHOUT CONTRAST TECHNIQUE: Contiguous axial images were obtained from the base of the skull through the vertex without intravenous contrast. COMPARISON:  09/12/2016 FINDINGS: Brain: No acute territorial infarction, hemorrhage or intracranial mass is seen. Punctate focus of increased density in the right cerebellum is unchanged and may represent small cavernoma or nonspecific calcification. Moderate to marked atrophy. Moderate small vessel ischemic changes of the white matter. Multiple bilateral infarcts within the bilateral basal ganglia, thalamus and white matter. Stable ventricle size Vascular: No hyperdense vessels.  Carotid artery calcifications. Skull: No fracture or suspicious bone lesion Sinuses/Orbits: Minimal mucosal thickening in the ethmoid sinuses. No acute orbital abnormality. Other: None IMPRESSION: No CT evidence for acute intracranial abnormality. Small vessel ischemic changes of the white matter with old infarcts in the bilateral basal ganglia and thalamus. Electronically Signed   By: Donavan Foil M.D.   On: 11/13/2016 20:30   Dg Chest [facility]  1 View  Result Date: 11/13/2016 CLINICAL  DATA:  Altered mental status EXAM: PORTABLE CHEST 1 VIEW COMPARISON:  03/17/2016 FINDINGS: The heart size and mediastinal contours are within normal limits. Both lungs are clear. The visualized skeletal structures are unremarkable. Right axillary vascular calcifications. IMPRESSION: No active disease. Electronically Signed   By: Donavan Foil M.D.   On: 11/13/2016 20:17     ASSESSMENT AND PLAN:   72 year old male with past medical history of hypertension, hyperlipidemia, GERD, diabetes who presented to the hospital due to altered mental status and being found down by his daughter.  1. Altered mental status-this is metabolic encephalopathy secondary to urinary tract infection also acute kidney injury. - CT head is negative for acute pathology. Continue IV ceftriaxone for the UTI, continue IV fluids treat the rhabdomyolysis and acute kidney injury. Follow mental status.  2. UTI - cont. IV Ceftriaxone, Follow urine cultures.   3. Acute Rhabdomyolysis - due to fall and traumatic in nature.  - cont. IV fluids and follow CK's.   4. Acute kidney injury-secondary to dehydration. -Continue IV fluids, creatinine improving. Renal dose meds, avoid nephrotoxins.  5. Elevated troponin-secondary to acute kidney injury and demand ischemia. -Troponins have remained flat. No evidence of acute coronary syndrome. DC telemetry.  6. Diabetes type 2 without, dictation-continue sliding scale insulin.  7.essential hypertension-continue metoprolol, hydralazine.  8. Hyperlipidemia-continue simvastatin.   Await PT eval.   All the records are reviewed and case discussed with Care Management/Social Worker. Management plans discussed with the patient, family and they are in agreement.  CODE STATUS: Full code  DVT Prophylaxis: Lovenox  TOTAL TIME TAKING CARE OF THIS PATIENT: 30 minutes.   POSSIBLE D/C IN 1-2 DAYS, DEPENDING ON CLINICAL CONDITION.   Henreitta Leber M.D on 11/14/2016 at 1:55 PM  Between  7am to 6pm - Pager - [phone]  After 6pm go to [email or web address] - Proofreader  [facility]  Office  [phone]  CC: Primary care physician; [name], MD

## 2016-11-14 NOTE — Evaluation (Signed)
Physical Therapy Evaluation Patient Details Name: Manuel Randolph. MRN: [id number] DOB: Jan 19, 1946 Today's Date: 11/14/2016   History of Present Illness  presented to ER secondary to AMS with fall (down for unknown period of time), decreased responsiveness; admitted with metabolic encephalopathy related to UTI, AKI and rhabdomyolysis.  Clinical Impression  Upon evaluation, patient lethargic, but opens eyes and verbally responds to therapist.  Requires mod cuing for participation with session; hand-over-hand assist to initiate all functional movement patterns.  Limited ability to mobilize outside immediate BOS with noted delay in balance/righting reactions; associated with increased fall risk.  Currently requiring mod assist for bed mobility; mod assist for sit/stand, basic transfers and short-distance gait (5') with RW.  Poor ability to negotiate RW; unable to initiate weight shift of limb advancement without assist from therapist. Would benefit from skilled PT to address above deficits and promote optimal return to PLOF; recommend transition to STR upon discharge from acute hospitalization.     Follow Up Recommendations SNF    Equipment Recommendations  Rolling walker with 5" wheels    Recommendations for Other Services       Precautions / Restrictions Precautions Precautions: Fall Restrictions Weight Bearing Restrictions: No      Mobility  Bed Mobility Overal bed mobility: Needs Assistance Bed Mobility: Supine to Sit     Supine to sit: Mod assist     General bed mobility comments: assist for LE management, truncal elevation; poor task initiation and sequencing  Transfers Overall transfer level: Needs assistance Equipment used: Rolling walker (2 wheeled) Transfers: Sit to/from Stand Sit to Stand: Mod assist         General transfer comment: maintains LEs anterior to BOS with excessive posterior weight shift; assist for forward trunk lean, weight shift, lift off  and standing balance  Ambulation/Gait Ambulation/Gait assistance: Mod assist Ambulation Distance (Feet): 5 Feet Assistive device: Rolling walker (2 wheeled)       General Gait Details: broad BOS with short, choppy steps; poor step height/length; constant facilitation from therapist to unweight/advance LEs; poor balance, poor walker management  Stairs            Wheelchair Mobility    Modified Rankin (Stroke Patients Only)       Balance Overall balance assessment: Needs assistance Sitting-balance support: Feet supported;No upper extremity supported Sitting balance-Leahy Scale: Fair Sitting balance - Comments: seated functional reach 3-4" from immediate BOS; slow and guarded   Standing balance support: Bilateral upper extremity supported Standing balance-Leahy Scale: Poor                               Pertinent Vitals/Pain Pain Assessment: No/denies pain    Home Living Family/patient expects to be discharged to:: Skilled nursing facility Living Arrangements: Alone               Additional Comments: Lives alone in single-story home with single step/threshold to enter.  Children provide frequent check in and assist with ADLs, household chores, meals/meds.    Prior Function Level of Independence: Needs assistance         Comments: Assist from family for ADLs; sup/mod indep without assist device for household mobility, RW for limited community mobility     Hand Dominance   Dominant Hand: Right    Extremity/Trunk Assessment   Upper Extremity Assessment Upper Extremity Assessment: Generalized weakness    Lower Extremity Assessment Lower Extremity Assessment: Generalized weakness (grossly 3-/5 throughout bilat LEs)  Communication   Communication:  (mumbling)  Cognition Arousal/Alertness: Lethargic Behavior During Therapy: WFL for tasks assessed/performed Overall Cognitive Status: Difficult to assess                                  General Comments: oriented to self only; follows simple commands with increased time for processing      General Comments      Exercises Other Exercises Other Exercises: Sit/stand x2 with RW, mod assist-cuing/assist for foot placement, weight shift, standing posture and overall balance.  Poor balance/righting reactions; very slow, bradykinetic movements   Assessment/Plan    PT Assessment Patient needs continued PT services  PT Problem List Decreased strength;Decreased range of motion;Decreased activity tolerance;Decreased balance;Decreased mobility;Decreased coordination;Decreased cognition;Decreased safety awareness;Decreased knowledge of precautions       PT Treatment Interventions DME instruction;Gait training;Stair training;Functional mobility training;Therapeutic activities;Therapeutic exercise;Balance training;Patient/family education    PT Goals (Current goals can be found in the Care Plan section)  Acute Rehab PT Goals Patient Stated Goal: per family/chart, to consider STR with progression towards ALF in the future PT Goal Formulation: Patient unable to participate in goal setting Time For Goal Achievement: 11/28/16 Potential to Achieve Goals: Fair    Frequency Min 2X/week   Barriers to discharge Decreased caregiver support      Co-evaluation               AM-PAC PT "6 Clicks" Daily Activity  Outcome Measure Difficulty turning over in bed (including adjusting bedclothes, sheets and blankets)?: Total Difficulty moving from lying on back to sitting on the side of the bed? : Total Difficulty sitting down on and standing up from a chair with arms (e.g., wheelchair, bedside commode, etc,.)?: Total Help needed moving to and from a bed to chair (including a wheelchair)?: Total Help needed walking in hospital room?: Total Help needed climbing 3-5 steps with a railing? : Total 6 Click Score: 6    End of Session Equipment Utilized During Treatment:  Gait belt Activity Tolerance: Patient tolerated treatment well Patient left: in chair;with call bell/phone within reach;with chair alarm set Nurse Communication: Mobility status PT Visit Diagnosis: Difficulty in walking, not elsewhere classified (R26.2);Muscle weakness (generalized) (M62.81)    Time: 8416-6063 PT Time Calculation (min) (ACUTE ONLY): 22 min   Charges:   PT Evaluation $PT Eval Low Complexity: 1 Low PT Treatments $Therapeutic Activity: 8-22 mins   PT G Codes:   PT G-Codes **NOT FOR INPATIENT CLASS** Functional Assessment Tool Used: AM-PAC 6 Clicks Basic Mobility Functional Limitation: Mobility: Walking and moving around Mobility: Walking and Moving Around Current Status (K1601): At least 60 percent but less than 80 percent impaired, limited or restricted Mobility: Walking and Moving Around Goal Status [phone]): At least 1 percent but less than 20 percent impaired, limited or restricted    Maicie Vanderloop H. Owens Shark, PT, DPT, NCS 11/14/16, 4:30 PM [phone]

## 2016-11-14 NOTE — Care Management (Signed)
Spoke with Lattie Haw from [facility] who was in to speak with family. Daughter would like patient placed at [facility] in the near future. She is interested in short term rehab if patient qualifies. Referred Lattie Haw with [facility] to CSW, Randall Hiss for further questions.

## 2016-11-14 NOTE — Progress Notes (Signed)
PHARMACY - PHYSICIAN COMMUNICATION CRITICAL VALUE ALERT - BLOOD CULTURE IDENTIFICATION (BCID)  Results for orders placed or performed during the hospital encounter of 11/13/16  Blood Culture ID Panel (Reflexed) (Collected: 11/13/2016  8:40 PM)  Result Value Ref Range   Enterococcus species NOT DETECTED NOT DETECTED   Listeria monocytogenes NOT DETECTED NOT DETECTED   Staphylococcus species DETECTED (A) NOT DETECTED   Staphylococcus aureus NOT DETECTED NOT DETECTED   Methicillin resistance DETECTED (A) NOT DETECTED   Streptococcus species NOT DETECTED NOT DETECTED   Streptococcus agalactiae NOT DETECTED NOT DETECTED   Streptococcus pneumoniae NOT DETECTED NOT DETECTED   Streptococcus pyogenes NOT DETECTED NOT DETECTED   Acinetobacter baumannii NOT DETECTED NOT DETECTED   Enterobacteriaceae species NOT DETECTED NOT DETECTED   Enterobacter cloacae complex NOT DETECTED NOT DETECTED   Escherichia coli NOT DETECTED NOT DETECTED   Klebsiella oxytoca NOT DETECTED NOT DETECTED   Klebsiella pneumoniae NOT DETECTED NOT DETECTED   Proteus species NOT DETECTED NOT DETECTED   Serratia marcescens NOT DETECTED NOT DETECTED   Haemophilus influenzae NOT DETECTED NOT DETECTED   Neisseria meningitidis NOT DETECTED NOT DETECTED   Pseudomonas aeruginosa NOT DETECTED NOT DETECTED   Candida albicans NOT DETECTED NOT DETECTED   Candida glabrata NOT DETECTED NOT DETECTED   Candida krusei NOT DETECTED NOT DETECTED   Candida parapsilosis NOT DETECTED NOT DETECTED   Candida tropicalis NOT DETECTED NOT DETECTED    Name of physician (or Provider) Contacted: Hugelmeyer   Changes to prescribed antibiotics required:  No, will continue pt on ceftriaxone 1 gm IV Q24H.   Laquitha Heslin D 11/14/2016  8:54 PM \

## 2016-11-15 ENCOUNTER — Telehealth: Payer: Self-pay

## 2016-11-15 ENCOUNTER — Encounter: Payer: Self-pay | Admitting: Internal Medicine

## 2016-11-15 LAB — BASIC METABOLIC PANEL
Anion gap: 5 (ref 5–15)
BUN: 32 mg/dL — ABNORMAL HIGH (ref 6–20)
CALCIUM: 8.8 mg/dL — AB (ref 8.9–10.3)
CO2: 24 mmol/L (ref 22–32)
CREATININE: 1.75 mg/dL — AB (ref 0.61–1.24)
Chloride: 117 mmol/L — ABNORMAL HIGH (ref 101–111)
GFR calc Af Amer: 44 mL/min — ABNORMAL LOW (ref >60)
GFR calc non Af Amer: 38 mL/min — ABNORMAL LOW (ref >60)
GLUCOSE: 142 mg/dL — AB (ref 65–99)
Potassium: 4.2 mmol/L (ref 3.5–5.1)
Sodium: 146 mmol/L — ABNORMAL HIGH (ref 135–145)

## 2016-11-15 LAB — URINE CULTURE
Culture: NO GROWTH
Special Requests: NORMAL

## 2016-11-15 LAB — CBC
HEMATOCRIT: 37.9 % — AB (ref 40.0–52.0)
Hemoglobin: 12.4 g/dL — ABNORMAL LOW (ref 13.0–18.0)
MCH: 30 pg (ref 26.0–34.0)
MCHC: 32.8 g/dL (ref 32.0–36.0)
MCV: 91.5 fL (ref 80.0–100.0)
Platelets: 143 10*3/uL — ABNORMAL LOW (ref 150–440)
RBC: 4.14 MIL/uL — ABNORMAL LOW (ref 4.40–5.90)
RDW: 15.2 % — AB (ref 11.5–14.5)
WBC: 8.3 10*3/uL (ref 3.8–10.6)

## 2016-11-15 LAB — GLUCOSE, CAPILLARY
Glucose-Capillary: 125 mg/dL — ABNORMAL HIGH (ref 65–99)
Glucose-Capillary: 183 mg/dL — ABNORMAL HIGH (ref 65–99)
Glucose-Capillary: 236 mg/dL — ABNORMAL HIGH (ref 65–99)
Glucose-Capillary: 129 mg/dL — ABNORMAL HIGH (ref 65–99)

## 2016-11-15 LAB — CK: Total CK: 1010 U/L — ABNORMAL HIGH (ref 49–397)

## 2016-11-15 MED ORDER — DEXTROSE 5 % IV SOLN
INTRAVENOUS | Status: DC
  Administered 2016-11-15: 11:00:00 via INTRAVENOUS

## 2016-11-15 MED ORDER — TUBERCULIN PPD 5 UNIT/0.1ML ID SOLN
5.0000 [IU] | Freq: Once | INTRADERMAL | Status: AC
  Administered 2016-11-15: 5 [IU] via INTRADERMAL
  Filled 2016-11-15: qty 0.1

## 2016-11-15 NOTE — NC FL2 (Signed)
[place] LEVEL OF CARE SCREENING TOOL     IDENTIFICATION  Patient Name: Manuel MELENDREZ Sr. Birthdate: 23-Jun-1945 Sex: male Admission Date (Current Location): 11/13/2016  [place] and [place] Number:  Engineering geologist and Address:  [facility], [address], [place], [place] [address]      Provider Number: [id number]  Attending Physician Name and Address:  [name], MD  Relative Name and Phone Number:     Ingram, Onnen Daughter [phone]       Current Level of Care: Hospital Recommended Level of Care: [place] Prior Approval Number:    Date Approved/Denied:   PASRR Number: [id number]  Discharge Plan: SNF    Current Diagnoses: Patient Active Problem List   Diagnosis Date Noted  . Rhabdomyolysis 11/14/2016  . Multiple lacunar infarcts ([place]) 11/06/2016  . Small vessel disease, cerebrovascular 11/06/2016  . Syncope 09/13/2016  . Dermatitis 03/01/2016  . Anemia, pernicious 07/04/2015  . Hip pain 07/04/2015  . Aortic valve sclerosis 02/19/2015  . Chronic lymphocytic leukemia ([place]) 02/19/2015  . DM type 2 with diabetic peripheral neuropathy ([place]) 02/19/2015  . Essential (primary) hypertension 02/19/2015  . DM type 2 with diabetic mixed hyperlipidemia ([place]) 02/19/2015  . Diabetic peripheral neuropathy associated with type 2 diabetes mellitus ([place]) 02/19/2015  . Bleeding ulcer 02/19/2015  . Peripheral vascular disease ([place]) 02/19/2015  . 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 Encounter for screening for malignant neoplasm of prostate 02/19/2015  . Type 2 diabetes mellitus with stage 3 chronic kidney disease ([place]) 02/19/2015  . Compulsive tobacco user syndrome 02/19/2015  . 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  . Aortic valve defect 12/22/2012  . 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[place]) 11/13/2011    Orientation RESPIRATION  BLADDER Height & Weight     Self, Place  Normal Incontinent Weight: 131 lb 3.2 oz (59.5 kg) Height:  5\' 8"  (172.7 cm)  BEHAVIORAL SYMPTOMS/MOOD NEUROLOGICAL BOWEL NUTRITION STATUS      Incontinent Diet (Carb Modified)  AMBULATORY STATUS COMMUNICATION OF NEEDS Skin   Limited Assist Verbally Normal                       Personal Care Assistance Level of Assistance  Bathing, Feeding, Dressing Bathing Assistance: Limited assistance Feeding assistance: Limited assistance Dressing Assistance: Limited assistance     Functional Limitations Info  Sight, Speech, Hearing Sight Info: Adequate Hearing Info: Adequate Speech Info: Adequate    SPECIAL CARE FACTORS FREQUENCY  PT (By licensed PT)     PT Frequency: 5x a week              Contractures Contractures Info: Not present    Additional Factors Info  Code Status, Allergies, Psychotropic, Insulin Sliding Scale     H/O Dementia Code Status Info: Full Code Allergies Info: NKA Psychotropic Info: QUEtiapine (SEROQUEL) tablet 12.5 mg Insulin Sliding Scale Info: insulin aspart (novoLOG) injection 0-9 Units       Current Medications (11/15/2016):  This is the current hospital active medication list Current Facility-Administered Medications  Medication Dose Route Frequency Provider Last Rate Last Dose  . acetaminophen (TYLENOL) tablet 650 mg  650 mg Oral Q6H PRN Hugelmeyer, Alexis, DO       Or  . acetaminophen (TYLENOL) suppository 650 mg  650 mg Rectal Q6H PRN Hugelmeyer, Alexis, DO      . albuterol (PROVENTIL) (2.5 MG/3ML) 0.083% nebulizer solution 2.5 mg  2.5 mg Nebulization  Q6H PRN Hugelmeyer, Alexis, DO      . aspirin EC tablet 81 mg  81 mg Oral Daily Hugelmeyer, Alexis, DO   81 mg at 11/15/16 1113  . bisacodyl (DULCOLAX) EC tablet 5 mg  5 mg Oral Daily PRN Hugelmeyer, Alexis, DO      . cefTRIAXone (ROCEPHIN) 1 g in dextrose 5 % 50 mL IVPB  1 g Intravenous Q24H Henreitta Leber, MD   Stopped at 11/14/16 2214  .  dextrose 5 % solution   Intravenous Continuous Henreitta Leber, MD 50 mL/hr at 11/15/16 1100    . enoxaparin (LOVENOX) injection 40 mg  40 mg Subcutaneous Q24H Hugelmeyer, Alexis, DO   40 mg at 11/15/16 0045  . gabapentin (NEURONTIN) capsule 300 mg  300 mg Oral TID Hugelmeyer, Alexis, DO   300 mg at 11/15/16 1113  . hydrALAZINE (APRESOLINE) tablet 50 mg  50 mg Oral Q8H Hugelmeyer, Alexis, DO   50 mg at 11/15/16 1219  . insulin aspart (novoLOG) injection 0-5 Units  0-5 Units Subcutaneous QHS Hugelmeyer, Alexis, DO      . insulin aspart (novoLOG) injection 0-9 Units  0-9 Units Subcutaneous TID WC Hugelmeyer, Alexis, DO   2 Units at 11/15/16 1219  . ipratropium (ATROVENT) nebulizer solution 0.5 mg  0.5 mg Nebulization Q6H PRN Hugelmeyer, Alexis, DO      . magnesium citrate solution 1 Bottle  1 Bottle Oral Once PRN Hugelmeyer, Alexis, DO      . metoprolol tartrate (LOPRESSOR) tablet 25 mg  25 mg Oral BID Hugelmeyer, Alexis, DO   25 mg at 11/15/16 0546  . ondansetron (ZOFRAN) tablet 4 mg  4 mg Oral Q6H PRN Hugelmeyer, Alexis, DO       Or  . ondansetron (ZOFRAN) injection 4 mg  4 mg Intravenous Q6H PRN Hugelmeyer, Alexis, DO      . QUEtiapine (SEROQUEL) tablet 12.5 mg  12.5 mg Oral QHS Hugelmeyer, Alexis, DO   12.5 mg at 11/14/16 2034  . senna-docusate (Senokot-S) tablet 1 tablet  1 tablet Oral QHS PRN Hugelmeyer, Alexis, DO      . simvastatin (ZOCOR) tablet 20 mg  20 mg Oral QHS Hugelmeyer, Alexis, DO   20 mg at 11/14/16 2034  . sodium chloride flush (NS) 0.9 % injection 3 mL  3 mL Intravenous Q12H Hugelmeyer, Alexis, DO   3 mL at 11/15/16 1113     Discharge Medications: Please see discharge summary for a list of discharge medications.  Relevant Imaging Results:  Relevant Lab Results:   Additional Information SS# [id number]  Patient has dementia Pessy Delamar, Jones Broom, LCSWA

## 2016-11-15 NOTE — Clinical Social Work Note (Signed)
CSW spoke to patient's daughter [name] [phone] in regards to patient going to SNF for short term rehab and then transitioning to ALF.  Patient's daughter stated he has been at [facility] and would prefer to return back if possible.  CSW explained the process of trying to find a SNF and also informing her that patient will be in his copay days.  Patient's daughter is aware that patient will have to make a payment up front.  CSW was given permission to begin bed search in [place].  Formal assessment to follow.  Jones Broom. [place], MSW, [place]  11/15/2016 4:10 PM

## 2016-11-15 NOTE — Progress Notes (Signed)
TB injection given to patient on right forearm.  11/15/2016 at 6:00 PM  [place], RN

## 2016-11-15 NOTE — Progress Notes (Signed)
[place] [facility] [place] NAME: Manuel Randolph    MR#:  [id number]  DATE OF BIRTH:  1945-12-21  SUBJECTIVE:   Patient here due to altered mental status and being found down by his daughter. Noted to be in acute kidney injury with urinary tract infection and also rhabdomyolysis.  A little more awake again today, but globally weak. Renal function improving, CK's trending down.   REVIEW OF SYSTEMS:    Review of Systems  Unable to perform ROS: Mental acuity    Nutrition: Heart Healthy/Carb control Tolerating Diet: Yes Tolerating PT: Eval noted.    DRUG ALLERGIES:  No Known Allergies  VITALS:  Blood pressure (!) 156/70, pulse 66, temperature 98.1 F (36.7 C), temperature source Oral, resp. rate 16, height 5\' 8"  (1.727 m), weight 59.5 kg (131 lb 3.2 oz), SpO2 99 %.  PHYSICAL EXAMINATION:   Physical Exam  GENERAL:  71 y.o.-year-old patient sitting up in bed getting fed by nurse but in NAD.  EYES: Pupils equal, round, reactive to light and accommodation. No scleral icterus. Extraocular muscles intact.  HEENT: Head atraumatic, normocephalic. Oropharynx and nasopharynx clear.  NECK:  Supple, no jugular venous distention. No thyroid enlargement, no tenderness.  LUNGS: Normal breath sounds bilaterally, no wheezing, rales, rhonchi. No use of accessory muscles of respiration.  CARDIOVASCULAR: S1, S2 normal. No murmurs, rubs, or gallops.  ABDOMEN: Soft, nontender, nondistended. Bowel sounds present. No organomegaly or mass.  EXTREMITIES: No cyanosis, clubbing or edema b/l.    NEUROLOGIC: Cranial nerves II through XII are intact. No focal Motor or sensory deficits b/l. Globally weak.   PSYCHIATRIC: The patient is alert and oriented x 1.   SKIN: No obvious rash, lesion, or ulcer.    LABORATORY PANEL:   CBC  Recent Labs Lab 11/15/16 0441  WBC 8.3  HGB 12.4*  HCT 37.9*  PLT 143*    ------------------------------------------------------------------------------------------------------------------  Chemistries   Recent Labs Lab 11/14/16 0132 11/14/16 0553 11/15/16 0441  NA  --  142 146*  K  --  4.3 4.2  CL  --  114* 117*  CO2  --  21* 24  GLUCOSE  --  182* 142*  BUN  --  35* 32*  CREATININE  --  1.65* 1.75*  CALCIUM  --  8.4* 8.8*  MG 1.9  --   --   AST  --  30  --   ALT  --  14*  --   ALKPHOS  --  75  --   BILITOT  --  0.7  --    ------------------------------------------------------------------------------------------------------------------  Cardiac Enzymes  Recent Labs Lab 11/14/16 1345  TROPONINI 0.04*   ------------------------------------------------------------------------------------------------------------------  RADIOLOGY:  Ct Head Wo Contrast  Result Date: 11/13/2016 CLINICAL DATA:  Altered mental status EXAM: CT HEAD WITHOUT CONTRAST TECHNIQUE: Contiguous axial images were obtained from the base of the skull through the vertex without intravenous contrast. COMPARISON:  09/12/2016 FINDINGS: Brain: No acute territorial infarction, hemorrhage or intracranial mass is seen. Punctate focus of increased density in the right cerebellum is unchanged and may represent small cavernoma or nonspecific calcification. Moderate to marked atrophy. Moderate small vessel ischemic changes of the white matter. Multiple bilateral infarcts within the bilateral basal ganglia, thalamus and white matter. Stable ventricle size Vascular: No hyperdense vessels.  Carotid artery calcifications. Skull: No fracture or suspicious bone lesion Sinuses/Orbits: Minimal mucosal thickening in the ethmoid sinuses. No acute orbital abnormality. Other: None IMPRESSION: No CT evidence for acute intracranial abnormality.  Small vessel ischemic changes of the white matter with old infarcts in the bilateral basal ganglia and thalamus. Electronically Signed   By: Donavan Foil M.D.   On:  11/13/2016 20:30   Dg Chest Port 1 View  Result Date: 11/13/2016 CLINICAL DATA:  Altered mental status EXAM: PORTABLE CHEST 1 VIEW COMPARISON:  03/17/2016 FINDINGS: The heart size and mediastinal contours are within normal limits. Both lungs are clear. The visualized skeletal structures are unremarkable. Right axillary vascular calcifications. IMPRESSION: No active disease. Electronically Signed   By: Donavan Foil M.D.   On: 11/13/2016 20:17     ASSESSMENT AND PLAN:   71 year old male with past medical history of hypertension, hyperlipidemia, GERD, diabetes who presented to the hospital due to altered mental status and being found down by his daughter.  1. Altered mental status-this is metabolic encephalopathy secondary to urinary tract infection also acute kidney injury. - CT head is negative for acute pathology.  - Continue IV ceftriaxone for the UTI, continue IV fluids treat the rhabdomyolysis and acute kidney injury. Mental status improving.   2. UTI - cont. IV Ceftriaxone - await cultures.   3. Acute Rhabdomyolysis - due to fall and traumatic in nature.  - continue IV fluids, CKs are trending down.  4. Hypernatremia-we'll switch IV fluids from normal saline to D5W. Follow sodium.   5. Acute kidney injury-secondary to dehydration. -Continue IV fluids, creatinine improving. Renal dose meds, avoid nephrotoxins.  6. Elevated troponin-secondary to acute kidney injury and demand ischemia. -Troponins have remained flat. No evidence of acute coronary syndrome.   7. Diabetes type 2 without, dictation-continue sliding scale insulin.  8.essential hypertension-continue metoprolol, hydralazine.  9. Hyperlipidemia-continue simvastatin.  Physical therapy evaluation noted. Social work notified about skilled nursing facility placement. Attempted to call the patient's family on the phone but no answer.  All the records are reviewed and case discussed with Care Management/Social  Worker. Management plans discussed with the patient, family and they are in agreement.  CODE STATUS: Full code  DVT Prophylaxis: Lovenox  TOTAL TIME TAKING CARE OF THIS PATIENT: 30 minutes.   POSSIBLE D/C IN 1-2 DAYS, DEPENDING ON CLINICAL CONDITION.   Henreitta Leber M.D on 11/15/2016 at 12:32 PM  Between 7am to 6pm - Pager - [phone]  After 6pm go to [email or web address] - Proofreader  [facility]  Office  [phone]  CC: Primary care physician; [name], MD

## 2016-11-15 NOTE — Telephone Encounter (Signed)
Faxed letter from Dr Army Melia typed today in communications to Manuel Randolph at [place] @ [phone]. Verified it went through.

## 2016-11-15 NOTE — Care Management Important Message (Signed)
Important Message  Patient Details  Name: Manuel STAVE Sr. MRN: [id number] Date of Birth: 1945/08/02   Medicare Important Message Given:  Yes    Jolly Mango, RN 11/15/2016, 10:48 AM

## 2016-11-16 LAB — BASIC METABOLIC PANEL
Anion gap: 7 (ref 5–15)
BUN: 41 mg/dL — AB (ref 6–20)
CALCIUM: 8.8 mg/dL — AB (ref 8.9–10.3)
CO2: 20 mmol/L — ABNORMAL LOW (ref 22–32)
Chloride: 113 mmol/L — ABNORMAL HIGH (ref 101–111)
Creatinine, Ser: 1.63 mg/dL — ABNORMAL HIGH (ref 0.61–1.24)
GFR calc Af Amer: 48 mL/min — ABNORMAL LOW (ref >60)
GFR, EST NON AFRICAN AMERICAN: 41 mL/min — AB (ref >60)
GLUCOSE: 147 mg/dL — AB (ref 65–99)
Potassium: 4 mmol/L (ref 3.5–5.1)
Sodium: 140 mmol/L (ref 135–145)

## 2016-11-16 LAB — GLUCOSE, CAPILLARY
GLUCOSE-CAPILLARY: 165 mg/dL — AB (ref 65–99)
Glucose-Capillary: 176 mg/dL — ABNORMAL HIGH (ref 65–99)
Glucose-Capillary: 152 mg/dL — ABNORMAL HIGH (ref 65–99)
Glucose-Capillary: 180 mg/dL — ABNORMAL HIGH (ref 65–99)

## 2016-11-16 LAB — CK: CK TOTAL: 819 U/L — AB (ref 49–397)

## 2016-11-16 MED ORDER — AMLODIPINE BESYLATE 5 MG PO TABS
5.0000 mg | ORAL_TABLET | Freq: Every day | ORAL | Status: DC
  Administered 2016-11-17 – 2016-11-18 (×2): 5 mg via ORAL
  Filled 2016-11-16 (×3): qty 1

## 2016-11-16 MED ORDER — SODIUM CHLORIDE 0.9 % IV SOLN
INTRAVENOUS | Status: DC
  Administered 2016-11-16: 10:00:00 via INTRAVENOUS

## 2016-11-16 MED ORDER — NICOTINE 14 MG/24HR TD PT24
14.0000 mg | MEDICATED_PATCH | Freq: Every day | TRANSDERMAL | Status: DC
  Administered 2016-11-16: 14 mg via TRANSDERMAL
  Filled 2016-11-16 (×2): qty 1

## 2016-11-16 NOTE — Progress Notes (Signed)
Pt son at bedside. Update given. Pt awake, placed order for lunch. Will give medications (previously refused) with lunchtime meal. Pt continues with mild bradycardia, HR in 50's.

## 2016-11-16 NOTE — Clinical Social Work Note (Signed)
Clinical Social Work Assessment  Patient Details  Name: Manuel BEAVERS Sr. MRN: [id number] Date of Birth: 11/13/1945  Date of referral:  11/15/16               Reason for consult:  Facility Placement                Permission sought to share information with:  Family Supports, Customer service manager Permission granted to share information::  Yes, Verbal Permission Granted  Name::     Manuel, Randolph Daughter [phone]   Agency::  SNF admissions  Relationship::     Contact Information:     Housing/Transportation Living arrangements for the past 2 months:  Apartment Source of Information:  Adult Children Patient Interpreter Needed:  None Criminal Activity/Legal Involvement Pertinent to Current Situation/Hospitalization:  No - Comment as needed Significant Relationships:  Adult Children Lives with:  Self Do you feel safe going back to the place where you live?  No Need for family participation in patient care:  Yes (Comment)  Care giving concerns:  Patient and family feel he needs some short term rehab.  Patient's family has been looking at ALFs with memory care for patient to go to once he discharges from SNF.   Social Worker assessment / plan:  Patient is a 71 year old male who is alert and oriented x2, assessment was completed by speaking with patient's family.  Patient lives alone in his own apartment, however patient has confusion and dementia, family are looking to find a memory care ALF for him.  Patient's family would like patient to return back to rehab in order to get his strength back up before he can transfer to an ALF.  Patient has been to rehab before, and would like to return back to [facility] if possible.  CSW informed patient's family that if [facility] is not able to accept patient they will have to consider going to a different SNF.  CSW explained to patient's family that since he has not had his 60 days of wellness, he  will be in copay days at [facility].  Patient's family is aware and gave CSW permission to begin bed search in [place].  Patient's family did not have any other questions or concerns.  Employment status:  Retired Forensic scientist:  Medicare PT Recommendations:  [place] / Referral to community resources:  [place]  Patient/Family's Response to care: Patient and family are agreeable to going to SNF for short term rehab then transitioning to memory care ALF.  Patient/Family's Understanding of and Emotional Response to Diagnosis, Current Treatment, and Prognosis: Patient's family are hopeful that he will not have to be at SNF very long, and they can transition him to memory care ALF after he finishes rehab.  Emotional Assessment Appearance:  Appears stated age Attitude/Demeanor/Rapport:    Affect (typically observed):  Appropriate, Calm Orientation:  Oriented to Self, Oriented to Place Alcohol / Substance use:  Not Applicable Psych involvement (Current and /or in the community):  No (Comment)  Discharge Needs  Concerns to be addressed:  Discharge Planning Concerns, Lack of Support, Home Safety Concerns, Care Coordination Readmission within the last 30 days:  No Current discharge risk:  Lives alone, Lack of support system Barriers to Discharge:  Continued Medical Work up   Anell Barr 11/16/2016, 9:18 AM

## 2016-11-16 NOTE — Evaluation (Signed)
Clinical/Bedside Swallow Evaluation Patient Details  Name: Manuel COSTON Sr. MRN: [id number] Date of Birth: 1945-09-21  Today's Date: 11/16/2016 Time: SLP Start Time (ACUTE ONLY): 8938 SLP Stop Time (ACUTE ONLY): 0952 SLP Time Calculation (min) (ACUTE ONLY): 23 min  Past Medical History:  Past Medical History:  Diagnosis Date  . Anemia   . Diabetes ([place])   . GERD (gastroesophageal reflux disease)   . Hyperlipidemia   . Hypertension   . Leukemia [facility])    Past Surgical History:  Past Surgical History:  Procedure Laterality Date  . CATARACT EXTRACTION W/PHACO Left 09/13/2014   Procedure: CATARACT EXTRACTION PHACO AND INTRAOCULAR LENS PLACEMENT (IOC);  Surgeon: Birder Robson, MD;  Location: [facility] ORS;  Service: Ophthalmology;  Laterality: Left;  [place] 01:03   . COLONOSCOPY  2014  . POPLITEAL ARTERY ANGIOPLASTY Left 2014  . PTCA Right    leg  . UPPER GASTROINTESTINAL ENDOSCOPY  2014   HPI:      Assessment / Plan / Recommendation Clinical Impression  pt presents at a mild risk for aspiration as characterized by poor intake and lengthy mastication noted with solids and semi solids. it should be noted that pt appears to be drowsy this date and could be affecting results. NSG stated no previous difficutly with swallow. slp recommends to continue regular diet only when pt is fully awake and notify st if any changes in condition occur.  SLP Visit Diagnosis: Dysphagia, oral phase (R13.11)    Aspiration Risk  Mild aspiration risk    Diet Recommendation Regular;Thin liquid   Liquid Administration via: Cup;Straw Medication Administration: Whole meds with puree Supervision: Staff to assist with self feeding Compensations: Slow rate;Small sips/bites;Follow solids with liquid Postural Changes: Seated upright at 90 degrees    Other  Recommendations Oral Care Recommendations: Oral care BID   Follow up Recommendations        Frequency and Duration min 3x week  1 week        Prognosis Prognosis for Safe Diet Advancement: Good      Swallow Study   General Date of Onset: 11/15/16 Type of Study: Bedside Swallow Evaluation Diet Prior to this Study: Regular;Thin liquids Temperature Spikes Noted: No Respiratory Status: Room air History of Recent Intubation: No Behavior/Cognition: Cooperative;Lethargic/Drowsy Oral Cavity Assessment: Within Functional Limits Oral Care Completed by SLP: No Oral Cavity - Dentition: Missing dentition Vision: Functional for self-feeding Self-Feeding Abilities: Needs assist Patient Positioning: Upright in bed Volitional Cough: Strong Volitional Swallow: Able to elicit    Oral/Motor/Sensory Function Overall Oral Motor/Sensory Function: Within functional limits   Ice Chips Ice chips: Within functional limits Presentation: Cup;Spoon   Thin Liquid Thin Liquid: Within functional limits Presentation: Cup;Straw    Nectar Thick Nectar Thick Liquid: Not tested   Honey Thick Honey Thick Liquid: Not tested   Puree Puree: Within functional limits Presentation: Spoon   Solid   GO   Solid: Within functional limits Presentation: Spoon    Functional Limitations: Swallowing Swallow Current Status (B0175): At least 1 percent but less than 20 percent impaired, limited or restricted Swallow Goal Status (Z0258): 0 percent impaired, limited or restricted   [employer] 11/16/2016,10:55 AM

## 2016-11-16 NOTE — Clinical Social Work Placement (Signed)
   CLINICAL SOCIAL WORK PLACEMENT  NOTE  Date:  11/16/2016  Patient Details  Name: Manuel WIECK Sr. MRN: [id number] Date of Birth: 1945-07-01  Clinical Social Work is seeking post-discharge placement for this patient at the [place] level of care (*CSW will initial, date and re-position this form in  chart as items are completed):  Yes   Patient/family provided with [employer] Work Department's list of facilities offering this level of care within the geographic area requested by the patient (or if unable, by the patient's family).  Yes   Patient/family informed of their freedom to choose among providers that offer the needed level of care, that participate in Medicare, Medicaid or managed care program needed by the patient, have an available bed and are willing to accept the patient.  Yes   Patient/family informed of [facility] ownership interest in [facility] and [facility], as well as of the fact that they are under no obligation to receive care at these facilities.  PASRR submitted to EDS on 11/15/16     PASRR number received on       Existing PASRR number confirmed on 11/15/16     FL2 transmitted to all facilities in geographic area requested by pt/family on 11/15/16     FL2 transmitted to all facilities within larger geographic area on       Patient informed that his/her managed care company has contracts with or will negotiate with certain facilities, including the following:            Patient/family informed of bed offers received.  Patient chooses bed at       Physician recommends and patient chooses bed at      Patient to be transferred to   on  .  Patient to be transferred to facility by       Patient family notified on   of transfer.  Name of family member notified:        PHYSICIAN Please sign FL2     Additional Comment:    _______________________________________________ Ross Ludwig,  LCSWA 11/16/2016, 9:26 AM

## 2016-11-16 NOTE — Progress Notes (Signed)
VS checked at AM rounds, HR in 30's. All Pt snoring. Responds to sternal rub but will not open eyes, tells me to "go away". Pt has hx of advanced dementia. MD paged. Dr. Posey Pronto on floor. New orders to follow.

## 2016-11-16 NOTE — Progress Notes (Addendum)
[place] at [place] NAME: Manuel Randolph    MR#:  [id number]  DATE OF BIRTH:  1945-12-17  SUBJECTIVE:   Patient here due to altered mental status and being found down by his daughter. Noted to be in acute kidney injury with rhabdomyolysis.  A little more awake again today, but globally weak. Renal function improving, CK's trending down.   REVIEW OF SYSTEMS:    Review of Systems  Unable to perform ROS: Mental acuity    Nutrition: Heart Healthy/Carb control Tolerating Diet: Yes Tolerating PT: Eval noted.    DRUG ALLERGIES:  No Known Allergies  VITALS:  Blood pressure (!) 140/55, pulse (!) 37, temperature 98.2 F (36.8 C), temperature source Axillary, resp. rate 16, height 5\' 8"  (1.727 m), weight 59.5 kg (131 lb 3.2 oz), SpO2 99 %.  PHYSICAL EXAMINATION:   Physical Exam  GENERAL:  71 y.o.-year-old patient sitting up in bed getting fed by nurse but in NAD.  EYES: Pupils equal, round, reactive to light and accommodation. No scleral icterus. Extraocular muscles intact.  HEENT: Head atraumatic, normocephalic. Oropharynx and nasopharynx clear.  NECK:  Supple, no jugular venous distention. No thyroid enlargement, no tenderness.  LUNGS: Normal breath sounds bilaterally, no wheezing, rales, rhonchi. No use of accessory muscles of respiration.  CARDIOVASCULAR: S1, S2 normal. No murmurs, rubs, or gallops.  ABDOMEN: Soft, nontender, nondistended. Bowel sounds present. No organomegaly or mass.  EXTREMITIES: No cyanosis, clubbing or edema b/l.    NEUROLOGIC: Cranial nerves II through XII are intact. No focal Motor or sensory deficits b/l. Globally weak.   PSYCHIATRIC: The patient is alert and oriented x 1.   SKIN: No obvious rash, lesion, or ulcer.    LABORATORY PANEL:   CBC  Recent Labs Lab 11/15/16 0441  WBC 8.3  HGB 12.4*  HCT 37.9*  PLT 143*    ------------------------------------------------------------------------------------------------------------------  Chemistries   Recent Labs Lab 11/14/16 0132 11/14/16 0553  11/16/16 0357  NA  --  142  < > 140  K  --  4.3  < > 4.0  CL  --  114*  < > 113*  CO2  --  21*  < > 20*  GLUCOSE  --  182*  < > 147*  BUN  --  35*  < > 41*  CREATININE  --  1.65*  < > 1.63*  CALCIUM  --  8.4*  < > 8.8*  MG 1.9  --   --   --   AST  --  30  --   --   ALT  --  14*  --   --   ALKPHOS  --  75  --   --   BILITOT  --  0.7  --   --   < > = values in this interval not displayed. ------------------------------------------------------------------------------------------------------------------  Cardiac Enzymes  Recent Labs Lab 11/14/16 1345  TROPONINI 0.04*   ------------------------------------------------------------------------------------------------------------------  RADIOLOGY:  No results found.   ASSESSMENT AND PLAN:   71 year old male with past medical history of hypertension, hyperlipidemia, GERD, diabetes who presented to the hospital due to altered mental status and being found down by his daughter.  1. Altered mental status-this is metabolic encephalopathy secondary to urinary tract infection also acute kidney injury. - CT head is negative for acute pathology.  - Continue IV ceftriaxone for the UTI, continue IV fluids treat the rhabdomyolysis and acute kidney injury. Mental status improving.   2. UTI - recieved IV Ceftriaxone -  UC no growth, WBC normal and no fever. D/c abxs  3. Acute Rhabdomyolysis - due to fall and traumatic in nature.  - continue IV fluids, CKs are trending down.  4. Hypernatremia-we'll switch IV fluids from normal saline to D5W. Follow sodium.   5. Acute kidney injury-secondary to dehydration. -Continue IV fluids, creatinine improving. Renal dose meds, avoid nephrotoxins.  6. Elevated troponin-secondary to acute kidney injury and demand  ischemia. -Troponins have remained flat. No evidence of acute coronary syndrome.   7. Diabetes type 2 without, dictation-continue sliding scale insulin.  8.essential hypertension-continue metoprolol, hydralazine.  9. Hyperlipidemia-continue simvastatin.  Physical therapy evaluation noted. Social work notified about skilled nursing facility placement. Attempted to call the patient's family on the phone but no answer.  All the records are reviewed and case discussed with Care Management/Social Worker. Management plans discussed with the patient, family and they are in agreement.  CODE STATUS: Full code  DVT Prophylaxis: Lovenox  TOTAL TIME TAKING CARE OF THIS PATIENT: 30 minutes.   POSSIBLE D/C IN 1-2 DAYS, DEPENDING ON CLINICAL CONDITION.   Rikayla Demmon M.D on 11/16/2016 at 12:15 PM  Between 7am to 6pm - Pager - [phone]  After 6pm go to [email or web address] - Proofreader  [facility]  Office  [phone]  CC: Primary care physician; [name], MD

## 2016-11-17 LAB — GLUCOSE, CAPILLARY
Glucose-Capillary: 144 mg/dL — ABNORMAL HIGH (ref 65–99)
Glucose-Capillary: 205 mg/dL — ABNORMAL HIGH (ref 65–99)
Glucose-Capillary: 177 mg/dL — ABNORMAL HIGH (ref 65–99)
Glucose-Capillary: 231 mg/dL — ABNORMAL HIGH (ref 65–99)

## 2016-11-17 LAB — CULTURE, BLOOD (ROUTINE X 2): Special Requests: ADEQUATE

## 2016-11-17 MED ORDER — NICOTINE 21 MG/24HR TD PT24
21.0000 mg | MEDICATED_PATCH | Freq: Every day | TRANSDERMAL | Status: DC
  Administered 2016-11-17 – 2016-11-18 (×2): 21 mg via TRANSDERMAL
  Filled 2016-11-17: qty 1

## 2016-11-17 NOTE — Progress Notes (Signed)
PPD to right forearm, result negative, no induration noted.

## 2016-11-17 NOTE — Progress Notes (Signed)
[facility] at [place] NAME: Manuel Randolph    MR#:  [id number]  DATE OF BIRTH:  05/11/1945  SUBJECTIVE:   Patient here due to altered mental status and being found down by his daughter. Noted to be in acute kidney injury with rhabdomyolysis.  Renal function improving, CK's trending down. Patient more awake today and more conversant.  Ate good breakfast per nurse.   REVIEW OF SYSTEMS:    Review of Systems  Constitutional: Negative for chills, fever and weight loss.  HENT: Negative for ear discharge, ear pain and nosebleeds.   Eyes: Negative for blurred vision, pain and discharge.  Respiratory: Negative for sputum production, shortness of breath, wheezing and stridor.   Cardiovascular: Negative for chest pain, palpitations, orthopnea and PND.  Gastrointestinal: Negative for abdominal pain, diarrhea, nausea and vomiting.  Genitourinary: Negative for frequency and urgency.  Musculoskeletal: Negative for back pain and joint pain.  Neurological: Positive for weakness. Negative for sensory change, speech change and focal weakness.  Psychiatric/Behavioral: Negative for depression and hallucinations. The patient is not nervous/anxious.     Nutrition: Heart Healthy/Carb control Tolerating Diet: Yes Tolerating PT: Eval noted.    DRUG ALLERGIES:  No Known Allergies  VITALS:  Blood pressure (!) 187/56, pulse 63, temperature 98.1 F (36.7 C), resp. rate 18, height 5\' 8"  (1.727 m), weight 59.5 kg (131 lb 3.2 oz), SpO2 100 %.  PHYSICAL EXAMINATION:   Physical Exam  GENERAL:  70 y.o.-year-old patient sitting up in bed getting fed by nurse but in NAD.  EYES: Pupils equal, round, reactive to light and accommodation. No scleral icterus. Extraocular muscles intact.  HEENT: Head atraumatic, normocephalic. Oropharynx and nasopharynx clear.  NECK:  Supple, no jugular venous distention. No thyroid enlargement, no tenderness.  LUNGS: Normal breath sounds  bilaterally, no wheezing, rales, rhonchi. No use of accessory muscles of respiration.  CARDIOVASCULAR: S1, S2 normal. No murmurs, rubs, or gallops.  ABDOMEN: Soft, nontender, nondistended. Bowel sounds present. No organomegaly or mass.  EXTREMITIES: No cyanosis, clubbing or edema b/l.    NEUROLOGIC: Cranial nerves II through XII are intact. No focal Motor or sensory deficits b/l. Globally weak.   PSYCHIATRIC: The patient is alert and oriented x 1.   SKIN: No obvious rash, lesion, or ulcer.    LABORATORY PANEL:   CBC  Recent Labs Lab 11/15/16 0441  WBC 8.3  HGB 12.4*  HCT 37.9*  PLT 143*   ------------------------------------------------------------------------------------------------------------------  Chemistries   Recent Labs Lab 11/14/16 0132 11/14/16 0553  11/16/16 0357  NA  --  142  < > 140  K  --  4.3  < > 4.0  CL  --  114*  < > 113*  CO2  --  21*  < > 20*  GLUCOSE  --  182*  < > 147*  BUN  --  35*  < > 41*  CREATININE  --  1.65*  < > 1.63*  CALCIUM  --  8.4*  < > 8.8*  MG 1.9  --   --   --   AST  --  30  --   --   ALT  --  14*  --   --   ALKPHOS  --  75  --   --   BILITOT  --  0.7  --   --   < > = values in this interval not displayed. ------------------------------------------------------------------------------------------------------------------  Cardiac Enzymes  Recent Labs Lab 11/14/16 1345  TROPONINI 0.04*   ------------------------------------------------------------------------------------------------------------------  RADIOLOGY:  No results found.   ASSESSMENT AND PLAN:   71 year old male with past medical history of hypertension, hyperlipidemia, GERD, diabetes who presented to the hospital due to altered mental status and being found down by his daughter.  1. Altered mental status-this is metabolic encephalopathy secondary to urinary tract infection also acute kidney injury. - CT head is negative for acute pathology.  -  continue IV  fluids treat the rhabdomyolysis and acute kidney injury. Mental status improving.   2. UTI - recieved IV Ceftriaxone - UC no growth, WBC normal and no fever. D/c abxs  3. Acute Rhabdomyolysis - due to fall and traumatic in nature.  -received IV fluids, CKs are trending down.  4. Hypernatremia-resolved.  5. Acute kidney injury-secondary to dehydration. -d/c IV fluids, creatinine improving. Renal dose meds, avoid nephrotoxins.  6. Elevated troponin-secondary to acute kidney injury and demand ischemia. -Troponins have remained flat. No evidence of acute coronary syndrome.   7. Diabetes type 2 without, dictation-continue sliding scale insulin.  8.essential hypertension-continue metoprolol, hydralazine.  9. Hyperlipidemia-continue simvastatin.  Physical therapy evaluation noted. Social work notified about skilled nursing facility placement.  -daughter was updated on the phone yesterday. All the records are reviewed and case discussed with Care Management/Social Worker. Management plans discussed with the patient, family and they are in agreement.  CODE STATUS: Full code  DVT Prophylaxis: Lovenox  TOTAL TIME TAKING CARE OF THIS PATIENT: 30 minutes.   POSSIBLE D/C IN 1-2 DAYS, DEPENDING ON CLINICAL CONDITION.   Davione Lenker M.D on 11/17/2016 at 11:55 AM  Between 7am to 6pm - Pager - [phone]  After 6pm go to [email or web address] - Proofreader  [facility]  Office  [phone]  CC: Primary care physician; [name], MD

## 2016-11-18 DIAGNOSIS — N179 Acute kidney failure, unspecified: Secondary | ICD-10-CM | POA: Diagnosis not present

## 2016-11-18 DIAGNOSIS — Z5189 Encounter for other specified aftercare: Secondary | ICD-10-CM | POA: Diagnosis not present

## 2016-11-18 DIAGNOSIS — M6281 Muscle weakness (generalized): Secondary | ICD-10-CM | POA: Diagnosis not present

## 2016-11-18 DIAGNOSIS — F0151 Vascular dementia with behavioral disturbance: Secondary | ICD-10-CM | POA: Diagnosis not present

## 2016-11-18 DIAGNOSIS — R4182 Altered mental status, unspecified: Secondary | ICD-10-CM | POA: Diagnosis not present

## 2016-11-18 DIAGNOSIS — M6282 Rhabdomyolysis: Secondary | ICD-10-CM | POA: Diagnosis not present

## 2016-11-18 DIAGNOSIS — E870 Hyperosmolality and hypernatremia: Secondary | ICD-10-CM | POA: Diagnosis not present

## 2016-11-18 DIAGNOSIS — Z7401 Bed confinement status: Secondary | ICD-10-CM | POA: Diagnosis not present

## 2016-11-18 DIAGNOSIS — E0821 Diabetes mellitus due to underlying condition with diabetic nephropathy: Secondary | ICD-10-CM | POA: Diagnosis not present

## 2016-11-18 DIAGNOSIS — E119 Type 2 diabetes mellitus without complications: Secondary | ICD-10-CM | POA: Diagnosis not present

## 2016-11-18 DIAGNOSIS — N2889 Other specified disorders of kidney and ureter: Secondary | ICD-10-CM | POA: Diagnosis not present

## 2016-11-18 DIAGNOSIS — I10 Essential (primary) hypertension: Secondary | ICD-10-CM | POA: Diagnosis not present

## 2016-11-18 DIAGNOSIS — E1121 Type 2 diabetes mellitus with diabetic nephropathy: Secondary | ICD-10-CM | POA: Diagnosis not present

## 2016-11-18 DIAGNOSIS — R2681 Unsteadiness on feet: Secondary | ICD-10-CM | POA: Diagnosis not present

## 2016-11-18 DIAGNOSIS — E785 Hyperlipidemia, unspecified: Secondary | ICD-10-CM | POA: Diagnosis not present

## 2016-11-18 DIAGNOSIS — G9340 Encephalopathy, unspecified: Secondary | ICD-10-CM | POA: Diagnosis not present

## 2016-11-18 DIAGNOSIS — E784 Other hyperlipidemia: Secondary | ICD-10-CM | POA: Diagnosis not present

## 2016-11-18 DIAGNOSIS — D649 Anemia, unspecified: Secondary | ICD-10-CM | POA: Diagnosis not present

## 2016-11-18 DIAGNOSIS — R41841 Cognitive communication deficit: Secondary | ICD-10-CM | POA: Diagnosis not present

## 2016-11-18 DIAGNOSIS — L89899 Pressure ulcer of other site, unspecified stage: Secondary | ICD-10-CM | POA: Diagnosis not present

## 2016-11-18 LAB — CULTURE, BLOOD (ROUTINE X 2)
Culture: NO GROWTH
SPECIAL REQUESTS: ADEQUATE

## 2016-11-18 LAB — GLUCOSE, CAPILLARY
Glucose-Capillary: 163 mg/dL — ABNORMAL HIGH (ref 65–99)
Glucose-Capillary: 301 mg/dL — ABNORMAL HIGH (ref 65–99)

## 2016-11-18 MED ORDER — LISINOPRIL 40 MG PO TABS: 40.0000 mg | ORAL_TABLET | Freq: Every day | ORAL | 3 refills | Status: DC

## 2016-11-18 MED ORDER — AMLODIPINE BESYLATE 5 MG PO TABS: 5.0000 mg | ORAL_TABLET | Freq: Every day | ORAL | 1 refills | Status: DC

## 2016-11-18 NOTE — Progress Notes (Signed)
VSS. Pt alert and oriented to self with confusion. PIV removed. EMS arrived to transport pt to [facility]

## 2016-11-18 NOTE — Progress Notes (Signed)
EMS called for transport.   Bethann Punches, RN

## 2016-11-18 NOTE — Progress Notes (Signed)
Gave report to [employer] at [employer].    Bethann Punches, RN

## 2016-11-18 NOTE — Progress Notes (Signed)
Patient and his daughter Manuel Randolph accepted bed offer from [facility]. Patient is medically stable for D/C to [facility] today. Per [name] liaison patient can come today to [facility]. RN will call report to RN Theressa Stamps at [phone] and arrange EMS for transport. Clinical Education officer, museum (CSW) sent D/C orders to [facility] via [facility]. Patient is aware of above. CSW contacted patient's daughter Manuel Randolph and made her aware of above. Please reconsult if future social work needs arise. CSW signing off.   [employer], LCSW [phone]

## 2016-11-18 NOTE — Discharge Summary (Addendum)
[facility] at [place] NAME: Manuel Randolph    MR#:  [id number]  DATE OF BIRTH:  22-Apr-1945  DATE OF ADMISSION:  11/13/2016 ADMITTING PHYSICIAN: Harvie Bridge, DO  DATE OF DISCHARGE: 11/18/16  PRIMARY CARE PHYSICIAN: [name], MD    ADMISSION DIAGNOSIS:  Dehydration [E86.0] Hyperkalemia [E87.5] Demand ischemia ([place]) [I24.8] Acute on chronic renal insufficiency [N28.9, N18.9] AKI (acute kidney injury) ([place]) [N17.9] Non-traumatic rhabdomyolysis [M62.82] Urinary tract infection without hematuria, site unspecified [N39.0] Altered mental status, unspecified altered mental status type [R41.82]  DISCHARGE DIAGNOSIS:  Acute encephalopathy/AMS--improving Acute renal failure due to acute rhabdomyolysis  SECONDARY DIAGNOSIS:   Past Medical History:  Diagnosis Date  . Anemia   . Diabetes ([place])   . GERD (gastroesophageal reflux disease)   . Hyperlipidemia   . Hypertension   . Leukemia [facility])     HOSPITAL COURSE:   71 year old male with past medical history of hypertension, hyperlipidemia, GERD, diabetes who presented to the hospital due to altered mental status and being found down by his daughter.  1. Altered mental status-this is metabolic encephalopathy secondary to  acute kidney injury. - CT head is negative for acute pathology.  -  continue IV fluids treat the rhabdomyolysis and acute kidney injury. Mental status improving.   2. Abnormal UA - recieved IV Ceftriaxone - UC no growth, WBC normal and no fever. D/c abxs  3. Acute Rhabdomyolysis - due to fall and traumatic in nature.  -received IV fluids, CKs are trending down.  4. Hypernatremia-resolved.  5. Acute kidney injury-secondary to dehydration. -d/c IV fluids, creatinine improving. Renal dose meds, avoid nephrotoxins.  6. Elevated troponin-secondary to acute kidney injury and demand ischemia. -Troponins have remained flat. No evidence of acute  coronary syndrome.   7. Diabetes type 2 without, dictation-pt  was on sliding scale insulin and  Now resumed metformin  8.essential hypertension-continue amlodipine, hydralazine -resume lisinopril @40  mg daily dose -added amlodipine -d/ece BB due to Bradycardia (asymptomatic)  9. Hyperlipidemia-continue simvastatin.  Overall much better. D/c to [facility] today  CONSULTS OBTAINED:    DRUG ALLERGIES:  No Known Allergies  DISCHARGE MEDICATIONS:   Current Discharge Medication List    START taking these medications   Details  amLODipine (NORVASC) 5 MG tablet Take 1 tablet (5 mg total) by mouth daily. Qty: 30 tablet, Refills: 1      CONTINUE these medications which have CHANGED   Details  lisinopril (PRINIVIL,ZESTRIL) 40 MG tablet Take 1 tablet (40 mg total) by mouth daily. Qty: 180 tablet, Refills: 3   Associated Diagnoses: Essential (primary) hypertension      CONTINUE these medications which have NOT CHANGED   Details  aspirin 81 MG tablet Take 1 tablet (81 mg total) by mouth daily. Qty: 30 tablet, Refills: 5   Associated Diagnoses: Peripheral vascular disease (HCC)    gabapentin (NEURONTIN) 300 MG capsule TAKE 1 CAPSULE BY MOUTH 3  TIMES DAILY Qty: 270 capsule, Refills: 0   Associated Diagnoses: DM type 2 with diabetic peripheral neuropathy (HCC)    hydrALAZINE (APRESOLINE) 50 MG tablet Take 1 tablet (50 mg total) by mouth every 8 (eight) hours. Qty: 90 tablet, Refills: 1    metFORMIN (GLUCOPHAGE) 500 MG tablet Take 1 tablet (500 mg total) by mouth 2 (two) times daily with a meal. Qty: 180 tablet, Refills: 1    mupirocin ointment (BACTROBAN) 2 % Apply to affected area 3 times daily Qty: 22 g, Refills: 5   Associated  Diagnoses: Dermatitis    QUEtiapine (SEROQUEL) 25 MG tablet Take 0.5 tablets (12.5 mg total) by mouth at bedtime. Qty: 15 tablet, Refills: 1    glucose blood test strip Use as instructed Qty: 100 each, Refills: 12    Lancets 28G MISC 1 each by Does  not apply route daily. Qty: 100 each, Refills: 3    simvastatin (ZOCOR) 20 MG tablet TAKE 1 TABLET BY MOUTH AT  BEDTIME Qty: 90 tablet, Refills: 0   Associated Diagnoses: DM type 2 with diabetic mixed hyperlipidemia (HCC)    triamcinolone cream (KENALOG) 0.1 % APPLY TO AFFECTED AREA TWICE A DAY Qty: 120 g, Refills: 0   Associated Diagnoses: Dermatitis      STOP taking these medications     metoprolol tartrate (LOPRESSOR) 25 MG tablet         If you experience worsening of your admission symptoms, develop shortness of breath, life threatening emergency, suicidal or homicidal thoughts you must seek medical attention immediately by calling 911 or calling your MD immediately  if symptoms less severe.  You Must read complete instructions/literature along with all the possible adverse reactions/side effects for all the Medicines you take and that have been prescribed to you. Take any new Medicines after you have completely understood and accept all the possible adverse reactions/side effects.   Please note  You were cared for by a hospitalist during your hospital stay. If you have any questions about your discharge medications or the care you received while you were in the hospital after you are discharged, you can call the unit and asked to speak with the hospitalist on call if the hospitalist that took care of you is not available. Once you are discharged, your primary care physician will handle any further medical issues. Please note that NO REFILLS for any discharge medications will be authorized once you are discharged, as it is imperative that you return to your primary care physician (or establish a relationship with a primary care physician if you do not have one) for your aftercare needs so that they can reassess your need for medications and monitor your lab values. Today   SUBJECTIVE   Doing ok. No issues per RN  VITAL SIGNS:  Blood pressure (!) 150/47, pulse 69, temperature  98.5 F (36.9 C), temperature source Oral, resp. rate 18, height 5\' 8"  (1.727 m), weight 59.5 kg (131 lb 3.2 oz), SpO2 99 %.  I/O:   Intake/Output Summary (Last 24 hours) at 11/18/16 0726 Last data filed at 11/17/16 1902  Gross per 24 hour  Intake              480 ml  Output                1 ml  Net              479 ml    PHYSICAL EXAMINATION:  GENERAL:  71 y.o.-year-old patient lying in the bed with no acute distress.  EYES: Pupils equal, round, reactive to light and accommodation. No scleral icterus. Extraocular muscles intact.  HEENT: Head atraumatic, normocephalic. Oropharynx and nasopharynx clear.  NECK:  Supple, no jugular venous distention. No thyroid enlargement, no tenderness.  LUNGS: Normal breath sounds bilaterally, no wheezing, rales,rhonchi or crepitation. No use of accessory muscles of respiration.  CARDIOVASCULAR: S1, S2 normal. No murmurs, rubs, or gallops.  ABDOMEN: Soft, non-tender, non-distended. Bowel sounds present. No organomegaly or mass.  EXTREMITIES: No pedal edema, cyanosis, or clubbing.  NEUROLOGIC: Cranial  nerves II through XII are intact. Muscle strength 5/5 in all extremities. Sensation intact. Gait not checked.  PSYCHIATRIC: The patient is alert and oriented x 3.  SKIN: No obvious rash, lesion, or ulcer.   DATA REVIEW:   CBC   Recent Labs Lab 11/15/16 0441  WBC 8.3  HGB 12.4*  HCT 37.9*  PLT 143*    Chemistries   Recent Labs Lab 11/14/16 0132 11/14/16 0553  11/16/16 0357  NA  --  142  < > 140  K  --  4.3  < > 4.0  CL  --  114*  < > 113*  CO2  --  21*  < > 20*  GLUCOSE  --  182*  < > 147*  BUN  --  35*  < > 41*  CREATININE  --  1.65*  < > 1.63*  CALCIUM  --  8.4*  < > 8.8*  MG 1.9  --   --   --   AST  --  30  --   --   ALT  --  14*  --   --   ALKPHOS  --  75  --   --   BILITOT  --  0.7  --   --   < > = values in this interval not displayed.  Microbiology Results   Recent Results (from the past 240 hour(s))  Blood Culture  (routine x 2)     Status: Abnormal   Collection Time: 11/13/16  8:40 PM  Result Value Ref Range Status   Specimen Description BLOOD LEFT FOREARM  Final   Special Requests   Final    BOTTLES DRAWN AEROBIC AND ANAEROBIC Blood Culture adequate volume   Culture  Setup Time   Final    GRAM POSITIVE COCCI ANAEROBIC BOTTLE ONLY CRITICAL RESULT CALLED TO, READ BACK BY AND VERIFIED WITH: JASON ROBBINS AT 1939 ON 11/14/2016 JJB    Culture (A)  Final    STAPHYLOCOCCUS SPECIES (COAGULASE NEGATIVE) THE SIGNIFICANCE OF ISOLATING THIS ORGANISM FROM A SINGLE SET OF BLOOD CULTURES WHEN MULTIPLE SETS ARE DRAWN IS UNCERTAIN. PLEASE NOTIFY THE MICROBIOLOGY DEPARTMENT WITHIN ONE WEEK IF SPECIATION AND SENSITIVITIES ARE REQUIRED. Performed at [facility], [place] [address] [place], [place] [address]    Report Status 11/17/2016 FINAL  Final  Blood Culture (routine x 2)     Status: None   Collection Time: 11/13/16  8:40 PM  Result Value Ref Range Status   Specimen Description BLOOD LEFT FOREARM  Final   Special Requests   Final    BOTTLES DRAWN AEROBIC AND ANAEROBIC Blood Culture adequate volume   Culture NO GROWTH 5 DAYS  Final   Report Status 11/18/2016 FINAL  Final  Urine Culture     Status: None   Collection Time: 11/13/16  8:40 PM  Result Value Ref Range Status   Specimen Description URINE, RANDOM  Final   Special Requests Normal  Final   Culture   Final    NO GROWTH Performed at [facility], [place] [address], [place], [place] [address]    Report Status 11/15/2016 FINAL  Final  Blood Culture ID Panel (Reflexed)     Status: Abnormal   Collection Time: 11/13/16  8:40 PM  Result Value Ref Range Status   Enterococcus species NOT DETECTED NOT DETECTED Final   Listeria monocytogenes NOT DETECTED NOT DETECTED Final   Staphylococcus species DETECTED (A) NOT DETECTED Final    Comment: Methicillin (oxacillin)  resistant coagulase negative staphylococcus. Possible blood culture contaminant  (unless isolated from more than one blood culture draw or clinical case suggests pathogenicity). No antibiotic treatment is indicated for blood  culture contaminants. CRITICAL RESULT CALLED TO, READ BACK BY AND VERIFIED WITH: JASON ROBBINS AT 1939 ON 11/14/2016 JJB    Staphylococcus aureus NOT DETECTED NOT DETECTED Final   Methicillin resistance DETECTED (A) NOT DETECTED Final    Comment: CRITICAL RESULT CALLED TO, READ BACK BY AND VERIFIED WITH: JASON ROBBINS AT 1939 ON 11/14/2016 JJB    Streptococcus species NOT DETECTED NOT DETECTED Final   Streptococcus agalactiae NOT DETECTED NOT DETECTED Final   Streptococcus pneumoniae NOT DETECTED NOT DETECTED Final   Streptococcus pyogenes NOT DETECTED NOT DETECTED Final   Acinetobacter baumannii NOT DETECTED NOT DETECTED Final   Enterobacteriaceae species NOT DETECTED NOT DETECTED Final   Enterobacter cloacae complex NOT DETECTED NOT DETECTED Final   Escherichia coli NOT DETECTED NOT DETECTED Final   Klebsiella oxytoca NOT DETECTED NOT DETECTED Final   Klebsiella pneumoniae NOT DETECTED NOT DETECTED Final   Proteus species NOT DETECTED NOT DETECTED Final   Serratia marcescens NOT DETECTED NOT DETECTED Final   Haemophilus influenzae NOT DETECTED NOT DETECTED Final   Neisseria meningitidis NOT DETECTED NOT DETECTED Final   Pseudomonas aeruginosa NOT DETECTED NOT DETECTED Final   Candida albicans NOT DETECTED NOT DETECTED Final   Candida glabrata NOT DETECTED NOT DETECTED Final   Candida krusei NOT DETECTED NOT DETECTED Final   Candida parapsilosis NOT DETECTED NOT DETECTED Final   Candida tropicalis NOT DETECTED NOT DETECTED Final    RADIOLOGY:  No results found.   Management plans discussed with the patient, family and they are in agreement.  CODE STATUS:     Code Status Orders        Start     Ordered   11/14/16 0119  Full code  Continuous     11/14/16 0118    Code Status History    Date Active Date Inactive Code Status Order  ID Comments User Context   09/13/2016  5:16 AM 09/18/2016  3:19 PM Full Code [id number]  Harrie Foreman, MD Inpatient      TOTAL TIME TAKING CARE OF THIS PATIENT: *40* minutes.    Lateisha Thurlow M.D on 11/18/2016 at 7:26 AM  Between 7am to 6pm - Pager - [phone] After 6pm go to [email or web address] - password [email or web address]  Office  [phone]  CC: Primary care physician; [name], MD

## 2016-11-18 NOTE — Care Management Important Message (Signed)
Important Message  Patient Details  Name: Manuel OKANE Sr. MRN: [id number] Date of Birth: Nov 19, 1945   Medicare Important Message Given:  Yes    Jolly Mango, RN 11/18/2016, 11:26 AM

## 2016-11-18 NOTE — Clinical Social Work Placement (Signed)
   CLINICAL SOCIAL WORK PLACEMENT  NOTE  Date:  11/18/2016  Patient Details  Name: Manuel KLINKE Sr. MRN: [id number] Date of Birth: 10-05-1945  Clinical Social Work is seeking post-discharge placement for this patient at the [place] level of care (*CSW will initial, date and re-position this form in  chart as items are completed):  Yes   Patient/family provided with [facility] Work Department's list of facilities offering this level of care within the geographic area requested by the patient (or if unable, by the patient's family).  Yes   Patient/family informed of their freedom to choose among providers that offer the needed level of care, that participate in Medicare, Medicaid or managed care program needed by the patient, have an available bed and are willing to accept the patient.  Yes   Patient/family informed of [facility]'s ownership interest in [facility] and [facility], as well as of the fact that they are under no obligation to receive care at these facilities.  PASRR submitted to EDS on       PASRR number received on       Existing PASRR number confirmed on 11/15/16     FL2 transmitted to all facilities in geographic area requested by pt/family on 11/15/16     FL2 transmitted to all facilities within larger geographic area on       Patient informed that his/her managed care company has contracts with or will negotiate with certain facilities, including the following:        Yes   Patient/family informed of bed offers received.  Patient chooses bed at  ([facility] )     Physician recommends and patient chooses bed at      Patient to be transferred to  ([facility] ) on 11/18/16.  Patient to be transferred to facility by  [facility] )     Patient family notified on 11/18/16 of transfer.  Name of family member notified:   (Patient's daughter Manuel Randolph is aware of D/C today. )     PHYSICIAN       Additional  Comment:    _______________________________________________ Rosalin Buster, Veronia Beets, LCSW 11/18/2016, 11:01 AM

## 2016-11-21 DIAGNOSIS — E119 Type 2 diabetes mellitus without complications: Secondary | ICD-10-CM | POA: Diagnosis not present

## 2016-11-21 DIAGNOSIS — G9340 Encephalopathy, unspecified: Secondary | ICD-10-CM | POA: Diagnosis not present

## 2016-11-21 DIAGNOSIS — M6281 Muscle weakness (generalized): Secondary | ICD-10-CM | POA: Diagnosis not present

## 2016-11-21 DIAGNOSIS — E785 Hyperlipidemia, unspecified: Secondary | ICD-10-CM | POA: Diagnosis not present

## 2016-11-27 ENCOUNTER — Inpatient Hospital Stay: Payer: Medicare Other | Admitting: Internal Medicine

## 2016-11-28 DIAGNOSIS — N179 Acute kidney failure, unspecified: Secondary | ICD-10-CM | POA: Diagnosis not present

## 2016-11-28 DIAGNOSIS — E785 Hyperlipidemia, unspecified: Secondary | ICD-10-CM | POA: Diagnosis not present

## 2016-11-28 DIAGNOSIS — I10 Essential (primary) hypertension: Secondary | ICD-10-CM | POA: Diagnosis not present

## 2016-11-28 DIAGNOSIS — D649 Anemia, unspecified: Secondary | ICD-10-CM | POA: Diagnosis not present

## 2016-11-28 DIAGNOSIS — E119 Type 2 diabetes mellitus without complications: Secondary | ICD-10-CM | POA: Diagnosis not present

## 2016-11-28 DIAGNOSIS — M6281 Muscle weakness (generalized): Secondary | ICD-10-CM | POA: Diagnosis not present

## 2016-11-28 DIAGNOSIS — M6282 Rhabdomyolysis: Secondary | ICD-10-CM | POA: Diagnosis not present

## 2016-12-06 ENCOUNTER — Other Ambulatory Visit: Payer: Self-pay | Admitting: Family Medicine

## 2016-12-06 DIAGNOSIS — I6320 Cerebral infarction due to unspecified occlusion or stenosis of unspecified precerebral arteries: Secondary | ICD-10-CM

## 2016-12-10 DIAGNOSIS — E1142 Type 2 diabetes mellitus with diabetic polyneuropathy: Secondary | ICD-10-CM | POA: Diagnosis not present

## 2016-12-10 DIAGNOSIS — Z7901 Long term (current) use of anticoagulants: Secondary | ICD-10-CM | POA: Diagnosis not present

## 2016-12-10 DIAGNOSIS — G4734 Idiopathic sleep related nonobstructive alveolar hypoventilation: Secondary | ICD-10-CM | POA: Diagnosis not present

## 2016-12-10 DIAGNOSIS — E669 Obesity, unspecified: Secondary | ICD-10-CM | POA: Diagnosis not present

## 2016-12-10 DIAGNOSIS — Z7984 Long term (current) use of oral hypoglycemic drugs: Secondary | ICD-10-CM | POA: Diagnosis not present

## 2016-12-10 DIAGNOSIS — Z86718 Personal history of other venous thrombosis and embolism: Secondary | ICD-10-CM | POA: Diagnosis not present

## 2016-12-10 DIAGNOSIS — F418 Other specified anxiety disorders: Secondary | ICD-10-CM | POA: Diagnosis not present

## 2016-12-10 DIAGNOSIS — Z6841 Body Mass Index (BMI) 40.0 and over, adult: Secondary | ICD-10-CM | POA: Diagnosis not present

## 2016-12-10 DIAGNOSIS — F17210 Nicotine dependence, cigarettes, uncomplicated: Secondary | ICD-10-CM | POA: Diagnosis not present

## 2016-12-10 DIAGNOSIS — C9110 Chronic lymphocytic leukemia of B-cell type not having achieved remission: Secondary | ICD-10-CM | POA: Diagnosis not present

## 2016-12-10 DIAGNOSIS — D631 Anemia in chronic kidney disease: Secondary | ICD-10-CM | POA: Diagnosis not present

## 2016-12-10 DIAGNOSIS — N184 Chronic kidney disease, stage 4 (severe): Secondary | ICD-10-CM | POA: Diagnosis not present

## 2016-12-10 DIAGNOSIS — E1122 Type 2 diabetes mellitus with diabetic chronic kidney disease: Secondary | ICD-10-CM | POA: Diagnosis not present

## 2016-12-10 DIAGNOSIS — E113293 Type 2 diabetes mellitus with mild nonproliferative diabetic retinopathy without macular edema, bilateral: Secondary | ICD-10-CM | POA: Diagnosis not present

## 2016-12-10 DIAGNOSIS — F0390 Unspecified dementia without behavioral disturbance: Secondary | ICD-10-CM | POA: Diagnosis not present

## 2016-12-10 DIAGNOSIS — I129 Hypertensive chronic kidney disease with stage 1 through stage 4 chronic kidney disease, or unspecified chronic kidney disease: Secondary | ICD-10-CM | POA: Diagnosis not present

## 2016-12-11 DIAGNOSIS — C9110 Chronic lymphocytic leukemia of B-cell type not having achieved remission: Secondary | ICD-10-CM | POA: Diagnosis not present

## 2016-12-11 DIAGNOSIS — N184 Chronic kidney disease, stage 4 (severe): Secondary | ICD-10-CM | POA: Diagnosis not present

## 2016-12-11 DIAGNOSIS — F0390 Unspecified dementia without behavioral disturbance: Secondary | ICD-10-CM | POA: Diagnosis not present

## 2016-12-11 DIAGNOSIS — I129 Hypertensive chronic kidney disease with stage 1 through stage 4 chronic kidney disease, or unspecified chronic kidney disease: Secondary | ICD-10-CM | POA: Diagnosis not present

## 2016-12-11 DIAGNOSIS — D631 Anemia in chronic kidney disease: Secondary | ICD-10-CM | POA: Diagnosis not present

## 2016-12-11 DIAGNOSIS — E1122 Type 2 diabetes mellitus with diabetic chronic kidney disease: Secondary | ICD-10-CM | POA: Diagnosis not present

## 2016-12-12 ENCOUNTER — Ambulatory Visit: Payer: Medicare Other

## 2016-12-16 DIAGNOSIS — D631 Anemia in chronic kidney disease: Secondary | ICD-10-CM | POA: Diagnosis not present

## 2016-12-16 DIAGNOSIS — F0390 Unspecified dementia without behavioral disturbance: Secondary | ICD-10-CM | POA: Diagnosis not present

## 2016-12-16 DIAGNOSIS — E1122 Type 2 diabetes mellitus with diabetic chronic kidney disease: Secondary | ICD-10-CM | POA: Diagnosis not present

## 2016-12-16 DIAGNOSIS — C9110 Chronic lymphocytic leukemia of B-cell type not having achieved remission: Secondary | ICD-10-CM | POA: Diagnosis not present

## 2016-12-16 DIAGNOSIS — I129 Hypertensive chronic kidney disease with stage 1 through stage 4 chronic kidney disease, or unspecified chronic kidney disease: Secondary | ICD-10-CM | POA: Diagnosis not present

## 2016-12-16 DIAGNOSIS — N184 Chronic kidney disease, stage 4 (severe): Secondary | ICD-10-CM | POA: Diagnosis not present

## 2016-12-17 DIAGNOSIS — E1122 Type 2 diabetes mellitus with diabetic chronic kidney disease: Secondary | ICD-10-CM | POA: Diagnosis not present

## 2016-12-17 DIAGNOSIS — I129 Hypertensive chronic kidney disease with stage 1 through stage 4 chronic kidney disease, or unspecified chronic kidney disease: Secondary | ICD-10-CM | POA: Diagnosis not present

## 2016-12-17 DIAGNOSIS — N184 Chronic kidney disease, stage 4 (severe): Secondary | ICD-10-CM | POA: Diagnosis not present

## 2016-12-17 DIAGNOSIS — D631 Anemia in chronic kidney disease: Secondary | ICD-10-CM | POA: Diagnosis not present

## 2016-12-17 DIAGNOSIS — C9110 Chronic lymphocytic leukemia of B-cell type not having achieved remission: Secondary | ICD-10-CM | POA: Diagnosis not present

## 2016-12-17 DIAGNOSIS — F0390 Unspecified dementia without behavioral disturbance: Secondary | ICD-10-CM | POA: Diagnosis not present

## 2016-12-18 ENCOUNTER — Ambulatory Visit: Payer: Medicare Other

## 2016-12-18 DIAGNOSIS — N184 Chronic kidney disease, stage 4 (severe): Secondary | ICD-10-CM | POA: Diagnosis not present

## 2016-12-18 DIAGNOSIS — D631 Anemia in chronic kidney disease: Secondary | ICD-10-CM | POA: Diagnosis not present

## 2016-12-18 DIAGNOSIS — C9110 Chronic lymphocytic leukemia of B-cell type not having achieved remission: Secondary | ICD-10-CM | POA: Diagnosis not present

## 2016-12-18 DIAGNOSIS — E1122 Type 2 diabetes mellitus with diabetic chronic kidney disease: Secondary | ICD-10-CM | POA: Diagnosis not present

## 2016-12-18 DIAGNOSIS — I129 Hypertensive chronic kidney disease with stage 1 through stage 4 chronic kidney disease, or unspecified chronic kidney disease: Secondary | ICD-10-CM | POA: Diagnosis not present

## 2016-12-18 DIAGNOSIS — F0390 Unspecified dementia without behavioral disturbance: Secondary | ICD-10-CM | POA: Diagnosis not present

## 2016-12-19 DIAGNOSIS — I129 Hypertensive chronic kidney disease with stage 1 through stage 4 chronic kidney disease, or unspecified chronic kidney disease: Secondary | ICD-10-CM | POA: Diagnosis not present

## 2016-12-19 DIAGNOSIS — E1122 Type 2 diabetes mellitus with diabetic chronic kidney disease: Secondary | ICD-10-CM | POA: Diagnosis not present

## 2016-12-19 DIAGNOSIS — C9110 Chronic lymphocytic leukemia of B-cell type not having achieved remission: Secondary | ICD-10-CM | POA: Diagnosis not present

## 2016-12-19 DIAGNOSIS — F0390 Unspecified dementia without behavioral disturbance: Secondary | ICD-10-CM | POA: Diagnosis not present

## 2016-12-19 DIAGNOSIS — D631 Anemia in chronic kidney disease: Secondary | ICD-10-CM | POA: Diagnosis not present

## 2016-12-19 DIAGNOSIS — N184 Chronic kidney disease, stage 4 (severe): Secondary | ICD-10-CM | POA: Diagnosis not present

## 2016-12-20 DIAGNOSIS — F0390 Unspecified dementia without behavioral disturbance: Secondary | ICD-10-CM | POA: Diagnosis not present

## 2016-12-20 DIAGNOSIS — N184 Chronic kidney disease, stage 4 (severe): Secondary | ICD-10-CM | POA: Diagnosis not present

## 2016-12-20 DIAGNOSIS — D631 Anemia in chronic kidney disease: Secondary | ICD-10-CM | POA: Diagnosis not present

## 2016-12-20 DIAGNOSIS — E1122 Type 2 diabetes mellitus with diabetic chronic kidney disease: Secondary | ICD-10-CM | POA: Diagnosis not present

## 2016-12-20 DIAGNOSIS — C9110 Chronic lymphocytic leukemia of B-cell type not having achieved remission: Secondary | ICD-10-CM | POA: Diagnosis not present

## 2016-12-20 DIAGNOSIS — I129 Hypertensive chronic kidney disease with stage 1 through stage 4 chronic kidney disease, or unspecified chronic kidney disease: Secondary | ICD-10-CM | POA: Diagnosis not present

## 2016-12-23 DIAGNOSIS — D649 Anemia, unspecified: Secondary | ICD-10-CM | POA: Diagnosis not present

## 2016-12-23 DIAGNOSIS — C9111 Chronic lymphocytic leukemia of B-cell type in remission: Secondary | ICD-10-CM | POA: Diagnosis not present

## 2016-12-23 DIAGNOSIS — I10 Essential (primary) hypertension: Secondary | ICD-10-CM | POA: Diagnosis not present

## 2016-12-23 DIAGNOSIS — D631 Anemia in chronic kidney disease: Secondary | ICD-10-CM | POA: Diagnosis not present

## 2016-12-23 DIAGNOSIS — N184 Chronic kidney disease, stage 4 (severe): Secondary | ICD-10-CM | POA: Diagnosis not present

## 2016-12-23 DIAGNOSIS — F0390 Unspecified dementia without behavioral disturbance: Secondary | ICD-10-CM | POA: Diagnosis not present

## 2016-12-23 DIAGNOSIS — E1122 Type 2 diabetes mellitus with diabetic chronic kidney disease: Secondary | ICD-10-CM | POA: Diagnosis not present

## 2016-12-23 DIAGNOSIS — C9110 Chronic lymphocytic leukemia of B-cell type not having achieved remission: Secondary | ICD-10-CM | POA: Diagnosis not present

## 2016-12-23 DIAGNOSIS — I129 Hypertensive chronic kidney disease with stage 1 through stage 4 chronic kidney disease, or unspecified chronic kidney disease: Secondary | ICD-10-CM | POA: Diagnosis not present

## 2016-12-23 DIAGNOSIS — E785 Hyperlipidemia, unspecified: Secondary | ICD-10-CM | POA: Diagnosis not present

## 2016-12-23 DIAGNOSIS — R21 Rash and other nonspecific skin eruption: Secondary | ICD-10-CM | POA: Diagnosis not present

## 2016-12-24 DIAGNOSIS — N184 Chronic kidney disease, stage 4 (severe): Secondary | ICD-10-CM | POA: Diagnosis not present

## 2016-12-24 DIAGNOSIS — D631 Anemia in chronic kidney disease: Secondary | ICD-10-CM | POA: Diagnosis not present

## 2016-12-24 DIAGNOSIS — C9110 Chronic lymphocytic leukemia of B-cell type not having achieved remission: Secondary | ICD-10-CM | POA: Diagnosis not present

## 2016-12-24 DIAGNOSIS — E1122 Type 2 diabetes mellitus with diabetic chronic kidney disease: Secondary | ICD-10-CM | POA: Diagnosis not present

## 2016-12-24 DIAGNOSIS — F0390 Unspecified dementia without behavioral disturbance: Secondary | ICD-10-CM | POA: Diagnosis not present

## 2016-12-24 DIAGNOSIS — I129 Hypertensive chronic kidney disease with stage 1 through stage 4 chronic kidney disease, or unspecified chronic kidney disease: Secondary | ICD-10-CM | POA: Diagnosis not present

## 2016-12-25 DIAGNOSIS — N184 Chronic kidney disease, stage 4 (severe): Secondary | ICD-10-CM | POA: Diagnosis not present

## 2016-12-25 DIAGNOSIS — F0390 Unspecified dementia without behavioral disturbance: Secondary | ICD-10-CM | POA: Diagnosis not present

## 2016-12-25 DIAGNOSIS — C9110 Chronic lymphocytic leukemia of B-cell type not having achieved remission: Secondary | ICD-10-CM | POA: Diagnosis not present

## 2016-12-25 DIAGNOSIS — E1122 Type 2 diabetes mellitus with diabetic chronic kidney disease: Secondary | ICD-10-CM | POA: Diagnosis not present

## 2016-12-25 DIAGNOSIS — D631 Anemia in chronic kidney disease: Secondary | ICD-10-CM | POA: Diagnosis not present

## 2016-12-25 DIAGNOSIS — I129 Hypertensive chronic kidney disease with stage 1 through stage 4 chronic kidney disease, or unspecified chronic kidney disease: Secondary | ICD-10-CM | POA: Diagnosis not present

## 2016-12-26 DIAGNOSIS — C9110 Chronic lymphocytic leukemia of B-cell type not having achieved remission: Secondary | ICD-10-CM | POA: Diagnosis not present

## 2016-12-26 DIAGNOSIS — I129 Hypertensive chronic kidney disease with stage 1 through stage 4 chronic kidney disease, or unspecified chronic kidney disease: Secondary | ICD-10-CM | POA: Diagnosis not present

## 2016-12-26 DIAGNOSIS — F0390 Unspecified dementia without behavioral disturbance: Secondary | ICD-10-CM | POA: Diagnosis not present

## 2016-12-26 DIAGNOSIS — E1122 Type 2 diabetes mellitus with diabetic chronic kidney disease: Secondary | ICD-10-CM | POA: Diagnosis not present

## 2016-12-26 DIAGNOSIS — D631 Anemia in chronic kidney disease: Secondary | ICD-10-CM | POA: Diagnosis not present

## 2016-12-26 DIAGNOSIS — N184 Chronic kidney disease, stage 4 (severe): Secondary | ICD-10-CM | POA: Diagnosis not present

## 2016-12-27 DIAGNOSIS — D631 Anemia in chronic kidney disease: Secondary | ICD-10-CM | POA: Diagnosis not present

## 2016-12-27 DIAGNOSIS — C9110 Chronic lymphocytic leukemia of B-cell type not having achieved remission: Secondary | ICD-10-CM | POA: Diagnosis not present

## 2016-12-27 DIAGNOSIS — N184 Chronic kidney disease, stage 4 (severe): Secondary | ICD-10-CM | POA: Diagnosis not present

## 2016-12-27 DIAGNOSIS — I129 Hypertensive chronic kidney disease with stage 1 through stage 4 chronic kidney disease, or unspecified chronic kidney disease: Secondary | ICD-10-CM | POA: Diagnosis not present

## 2016-12-27 DIAGNOSIS — F0390 Unspecified dementia without behavioral disturbance: Secondary | ICD-10-CM | POA: Diagnosis not present

## 2016-12-27 DIAGNOSIS — E1122 Type 2 diabetes mellitus with diabetic chronic kidney disease: Secondary | ICD-10-CM | POA: Diagnosis not present

## 2016-12-30 DIAGNOSIS — C9110 Chronic lymphocytic leukemia of B-cell type not having achieved remission: Secondary | ICD-10-CM | POA: Diagnosis not present

## 2016-12-30 DIAGNOSIS — F0390 Unspecified dementia without behavioral disturbance: Secondary | ICD-10-CM | POA: Diagnosis not present

## 2016-12-30 DIAGNOSIS — D631 Anemia in chronic kidney disease: Secondary | ICD-10-CM | POA: Diagnosis not present

## 2016-12-30 DIAGNOSIS — I129 Hypertensive chronic kidney disease with stage 1 through stage 4 chronic kidney disease, or unspecified chronic kidney disease: Secondary | ICD-10-CM | POA: Diagnosis not present

## 2016-12-30 DIAGNOSIS — E1122 Type 2 diabetes mellitus with diabetic chronic kidney disease: Secondary | ICD-10-CM | POA: Diagnosis not present

## 2016-12-30 DIAGNOSIS — N184 Chronic kidney disease, stage 4 (severe): Secondary | ICD-10-CM | POA: Diagnosis not present

## 2016-12-31 DIAGNOSIS — I129 Hypertensive chronic kidney disease with stage 1 through stage 4 chronic kidney disease, or unspecified chronic kidney disease: Secondary | ICD-10-CM | POA: Diagnosis not present

## 2016-12-31 DIAGNOSIS — E1122 Type 2 diabetes mellitus with diabetic chronic kidney disease: Secondary | ICD-10-CM | POA: Diagnosis not present

## 2016-12-31 DIAGNOSIS — C9110 Chronic lymphocytic leukemia of B-cell type not having achieved remission: Secondary | ICD-10-CM | POA: Diagnosis not present

## 2016-12-31 DIAGNOSIS — N184 Chronic kidney disease, stage 4 (severe): Secondary | ICD-10-CM | POA: Diagnosis not present

## 2016-12-31 DIAGNOSIS — F0390 Unspecified dementia without behavioral disturbance: Secondary | ICD-10-CM | POA: Diagnosis not present

## 2016-12-31 DIAGNOSIS — D631 Anemia in chronic kidney disease: Secondary | ICD-10-CM | POA: Diagnosis not present

## 2017-01-02 DIAGNOSIS — I129 Hypertensive chronic kidney disease with stage 1 through stage 4 chronic kidney disease, or unspecified chronic kidney disease: Secondary | ICD-10-CM | POA: Diagnosis not present

## 2017-01-02 DIAGNOSIS — E1122 Type 2 diabetes mellitus with diabetic chronic kidney disease: Secondary | ICD-10-CM | POA: Diagnosis not present

## 2017-01-02 DIAGNOSIS — N184 Chronic kidney disease, stage 4 (severe): Secondary | ICD-10-CM | POA: Diagnosis not present

## 2017-01-02 DIAGNOSIS — C9110 Chronic lymphocytic leukemia of B-cell type not having achieved remission: Secondary | ICD-10-CM | POA: Diagnosis not present

## 2017-01-02 DIAGNOSIS — F0390 Unspecified dementia without behavioral disturbance: Secondary | ICD-10-CM | POA: Diagnosis not present

## 2017-01-02 DIAGNOSIS — D631 Anemia in chronic kidney disease: Secondary | ICD-10-CM | POA: Diagnosis not present

## 2017-01-03 DIAGNOSIS — N184 Chronic kidney disease, stage 4 (severe): Secondary | ICD-10-CM | POA: Diagnosis not present

## 2017-01-03 DIAGNOSIS — I129 Hypertensive chronic kidney disease with stage 1 through stage 4 chronic kidney disease, or unspecified chronic kidney disease: Secondary | ICD-10-CM | POA: Diagnosis not present

## 2017-01-03 DIAGNOSIS — D631 Anemia in chronic kidney disease: Secondary | ICD-10-CM | POA: Diagnosis not present

## 2017-01-03 DIAGNOSIS — F0390 Unspecified dementia without behavioral disturbance: Secondary | ICD-10-CM | POA: Diagnosis not present

## 2017-01-03 DIAGNOSIS — C9110 Chronic lymphocytic leukemia of B-cell type not having achieved remission: Secondary | ICD-10-CM | POA: Diagnosis not present

## 2017-01-03 DIAGNOSIS — E1122 Type 2 diabetes mellitus with diabetic chronic kidney disease: Secondary | ICD-10-CM | POA: Diagnosis not present

## 2017-01-07 DIAGNOSIS — E1122 Type 2 diabetes mellitus with diabetic chronic kidney disease: Secondary | ICD-10-CM | POA: Diagnosis not present

## 2017-01-07 DIAGNOSIS — C9110 Chronic lymphocytic leukemia of B-cell type not having achieved remission: Secondary | ICD-10-CM | POA: Diagnosis not present

## 2017-01-07 DIAGNOSIS — I129 Hypertensive chronic kidney disease with stage 1 through stage 4 chronic kidney disease, or unspecified chronic kidney disease: Secondary | ICD-10-CM | POA: Diagnosis not present

## 2017-01-07 DIAGNOSIS — N184 Chronic kidney disease, stage 4 (severe): Secondary | ICD-10-CM | POA: Diagnosis not present

## 2017-01-07 DIAGNOSIS — F0390 Unspecified dementia without behavioral disturbance: Secondary | ICD-10-CM | POA: Diagnosis not present

## 2017-01-07 DIAGNOSIS — D631 Anemia in chronic kidney disease: Secondary | ICD-10-CM | POA: Diagnosis not present

## 2017-01-08 DIAGNOSIS — Z79899 Other long term (current) drug therapy: Secondary | ICD-10-CM | POA: Diagnosis not present

## 2017-01-08 DIAGNOSIS — E559 Vitamin D deficiency, unspecified: Secondary | ICD-10-CM | POA: Diagnosis not present

## 2017-01-08 DIAGNOSIS — D649 Anemia, unspecified: Secondary | ICD-10-CM | POA: Diagnosis not present

## 2017-01-08 DIAGNOSIS — D519 Vitamin B12 deficiency anemia, unspecified: Secondary | ICD-10-CM | POA: Diagnosis not present

## 2017-01-08 DIAGNOSIS — D559 Anemia due to enzyme disorder, unspecified: Secondary | ICD-10-CM | POA: Diagnosis not present

## 2017-01-08 DIAGNOSIS — E039 Hypothyroidism, unspecified: Secondary | ICD-10-CM | POA: Diagnosis not present

## 2017-01-08 DIAGNOSIS — D518 Other vitamin B12 deficiency anemias: Secondary | ICD-10-CM | POA: Diagnosis not present

## 2017-01-08 DIAGNOSIS — E119 Type 2 diabetes mellitus without complications: Secondary | ICD-10-CM | POA: Diagnosis not present

## 2017-01-09 DIAGNOSIS — D631 Anemia in chronic kidney disease: Secondary | ICD-10-CM | POA: Diagnosis not present

## 2017-01-09 DIAGNOSIS — F0390 Unspecified dementia without behavioral disturbance: Secondary | ICD-10-CM | POA: Diagnosis not present

## 2017-01-09 DIAGNOSIS — E1122 Type 2 diabetes mellitus with diabetic chronic kidney disease: Secondary | ICD-10-CM | POA: Diagnosis not present

## 2017-01-09 DIAGNOSIS — N184 Chronic kidney disease, stage 4 (severe): Secondary | ICD-10-CM | POA: Diagnosis not present

## 2017-01-09 DIAGNOSIS — C9110 Chronic lymphocytic leukemia of B-cell type not having achieved remission: Secondary | ICD-10-CM | POA: Diagnosis not present

## 2017-01-09 DIAGNOSIS — I129 Hypertensive chronic kidney disease with stage 1 through stage 4 chronic kidney disease, or unspecified chronic kidney disease: Secondary | ICD-10-CM | POA: Diagnosis not present

## 2017-01-13 DIAGNOSIS — M6281 Muscle weakness (generalized): Secondary | ICD-10-CM | POA: Diagnosis not present

## 2017-01-13 DIAGNOSIS — F0151 Vascular dementia with behavioral disturbance: Secondary | ICD-10-CM | POA: Diagnosis not present

## 2017-01-13 DIAGNOSIS — I499 Cardiac arrhythmia, unspecified: Secondary | ICD-10-CM | POA: Diagnosis not present

## 2017-01-13 DIAGNOSIS — E782 Mixed hyperlipidemia: Secondary | ICD-10-CM | POA: Diagnosis not present

## 2017-01-13 DIAGNOSIS — I10 Essential (primary) hypertension: Secondary | ICD-10-CM | POA: Diagnosis not present

## 2017-01-13 DIAGNOSIS — E119 Type 2 diabetes mellitus without complications: Secondary | ICD-10-CM | POA: Diagnosis not present

## 2017-01-17 DIAGNOSIS — I129 Hypertensive chronic kidney disease with stage 1 through stage 4 chronic kidney disease, or unspecified chronic kidney disease: Secondary | ICD-10-CM | POA: Diagnosis not present

## 2017-01-17 DIAGNOSIS — E1122 Type 2 diabetes mellitus with diabetic chronic kidney disease: Secondary | ICD-10-CM | POA: Diagnosis not present

## 2017-01-17 DIAGNOSIS — C9110 Chronic lymphocytic leukemia of B-cell type not having achieved remission: Secondary | ICD-10-CM | POA: Diagnosis not present

## 2017-01-17 DIAGNOSIS — D631 Anemia in chronic kidney disease: Secondary | ICD-10-CM | POA: Diagnosis not present

## 2017-01-17 DIAGNOSIS — N184 Chronic kidney disease, stage 4 (severe): Secondary | ICD-10-CM | POA: Diagnosis not present

## 2017-01-17 DIAGNOSIS — F0390 Unspecified dementia without behavioral disturbance: Secondary | ICD-10-CM | POA: Diagnosis not present

## 2017-01-20 DIAGNOSIS — D631 Anemia in chronic kidney disease: Secondary | ICD-10-CM | POA: Diagnosis not present

## 2017-01-20 DIAGNOSIS — C9110 Chronic lymphocytic leukemia of B-cell type not having achieved remission: Secondary | ICD-10-CM | POA: Diagnosis not present

## 2017-01-20 DIAGNOSIS — E1122 Type 2 diabetes mellitus with diabetic chronic kidney disease: Secondary | ICD-10-CM | POA: Diagnosis not present

## 2017-01-20 DIAGNOSIS — I10 Essential (primary) hypertension: Secondary | ICD-10-CM | POA: Diagnosis not present

## 2017-01-20 DIAGNOSIS — F0390 Unspecified dementia without behavioral disturbance: Secondary | ICD-10-CM | POA: Diagnosis not present

## 2017-01-20 DIAGNOSIS — N184 Chronic kidney disease, stage 4 (severe): Secondary | ICD-10-CM | POA: Diagnosis not present

## 2017-01-20 DIAGNOSIS — I129 Hypertensive chronic kidney disease with stage 1 through stage 4 chronic kidney disease, or unspecified chronic kidney disease: Secondary | ICD-10-CM | POA: Diagnosis not present

## 2017-01-20 DIAGNOSIS — F0151 Vascular dementia with behavioral disturbance: Secondary | ICD-10-CM | POA: Diagnosis not present

## 2017-01-24 DIAGNOSIS — N184 Chronic kidney disease, stage 4 (severe): Secondary | ICD-10-CM | POA: Diagnosis not present

## 2017-01-24 DIAGNOSIS — F0390 Unspecified dementia without behavioral disturbance: Secondary | ICD-10-CM | POA: Diagnosis not present

## 2017-01-24 DIAGNOSIS — E1122 Type 2 diabetes mellitus with diabetic chronic kidney disease: Secondary | ICD-10-CM | POA: Diagnosis not present

## 2017-01-24 DIAGNOSIS — C9110 Chronic lymphocytic leukemia of B-cell type not having achieved remission: Secondary | ICD-10-CM | POA: Diagnosis not present

## 2017-01-24 DIAGNOSIS — I129 Hypertensive chronic kidney disease with stage 1 through stage 4 chronic kidney disease, or unspecified chronic kidney disease: Secondary | ICD-10-CM | POA: Diagnosis not present

## 2017-01-24 DIAGNOSIS — D631 Anemia in chronic kidney disease: Secondary | ICD-10-CM | POA: Diagnosis not present

## 2017-01-28 DIAGNOSIS — F0151 Vascular dementia with behavioral disturbance: Secondary | ICD-10-CM | POA: Diagnosis not present

## 2017-01-28 DIAGNOSIS — I10 Essential (primary) hypertension: Secondary | ICD-10-CM | POA: Diagnosis not present

## 2017-01-29 DIAGNOSIS — F0390 Unspecified dementia without behavioral disturbance: Secondary | ICD-10-CM | POA: Diagnosis not present

## 2017-01-29 DIAGNOSIS — E039 Hypothyroidism, unspecified: Secondary | ICD-10-CM | POA: Diagnosis not present

## 2017-01-29 DIAGNOSIS — I10 Essential (primary) hypertension: Secondary | ICD-10-CM | POA: Diagnosis not present

## 2017-01-29 DIAGNOSIS — D631 Anemia in chronic kidney disease: Secondary | ICD-10-CM | POA: Diagnosis not present

## 2017-01-29 DIAGNOSIS — F0151 Vascular dementia with behavioral disturbance: Secondary | ICD-10-CM | POA: Diagnosis not present

## 2017-01-29 DIAGNOSIS — D649 Anemia, unspecified: Secondary | ICD-10-CM | POA: Diagnosis not present

## 2017-01-29 DIAGNOSIS — C9110 Chronic lymphocytic leukemia of B-cell type not having achieved remission: Secondary | ICD-10-CM | POA: Diagnosis not present

## 2017-01-29 DIAGNOSIS — E119 Type 2 diabetes mellitus without complications: Secondary | ICD-10-CM | POA: Diagnosis not present

## 2017-01-29 DIAGNOSIS — N184 Chronic kidney disease, stage 4 (severe): Secondary | ICD-10-CM | POA: Diagnosis not present

## 2017-01-29 DIAGNOSIS — I129 Hypertensive chronic kidney disease with stage 1 through stage 4 chronic kidney disease, or unspecified chronic kidney disease: Secondary | ICD-10-CM | POA: Diagnosis not present

## 2017-01-29 DIAGNOSIS — E782 Mixed hyperlipidemia: Secondary | ICD-10-CM | POA: Diagnosis not present

## 2017-01-29 DIAGNOSIS — E1122 Type 2 diabetes mellitus with diabetic chronic kidney disease: Secondary | ICD-10-CM | POA: Diagnosis not present

## 2017-01-29 DIAGNOSIS — E785 Hyperlipidemia, unspecified: Secondary | ICD-10-CM | POA: Diagnosis not present

## 2017-01-30 DIAGNOSIS — E1122 Type 2 diabetes mellitus with diabetic chronic kidney disease: Secondary | ICD-10-CM | POA: Diagnosis not present

## 2017-01-30 DIAGNOSIS — D631 Anemia in chronic kidney disease: Secondary | ICD-10-CM | POA: Diagnosis not present

## 2017-01-30 DIAGNOSIS — C9110 Chronic lymphocytic leukemia of B-cell type not having achieved remission: Secondary | ICD-10-CM | POA: Diagnosis not present

## 2017-01-30 DIAGNOSIS — I129 Hypertensive chronic kidney disease with stage 1 through stage 4 chronic kidney disease, or unspecified chronic kidney disease: Secondary | ICD-10-CM | POA: Diagnosis not present

## 2017-01-30 DIAGNOSIS — N184 Chronic kidney disease, stage 4 (severe): Secondary | ICD-10-CM | POA: Diagnosis not present

## 2017-01-30 DIAGNOSIS — F0390 Unspecified dementia without behavioral disturbance: Secondary | ICD-10-CM | POA: Diagnosis not present

## 2017-02-05 DIAGNOSIS — E1122 Type 2 diabetes mellitus with diabetic chronic kidney disease: Secondary | ICD-10-CM | POA: Diagnosis not present

## 2017-02-05 DIAGNOSIS — I129 Hypertensive chronic kidney disease with stage 1 through stage 4 chronic kidney disease, or unspecified chronic kidney disease: Secondary | ICD-10-CM | POA: Diagnosis not present

## 2017-02-05 DIAGNOSIS — F0390 Unspecified dementia without behavioral disturbance: Secondary | ICD-10-CM | POA: Diagnosis not present

## 2017-02-05 DIAGNOSIS — N184 Chronic kidney disease, stage 4 (severe): Secondary | ICD-10-CM | POA: Diagnosis not present

## 2017-02-05 DIAGNOSIS — C9110 Chronic lymphocytic leukemia of B-cell type not having achieved remission: Secondary | ICD-10-CM | POA: Diagnosis not present

## 2017-02-05 DIAGNOSIS — D631 Anemia in chronic kidney disease: Secondary | ICD-10-CM | POA: Diagnosis not present

## 2017-02-08 DIAGNOSIS — E669 Obesity, unspecified: Secondary | ICD-10-CM | POA: Diagnosis not present

## 2017-02-08 DIAGNOSIS — R4183 Borderline intellectual functioning: Secondary | ICD-10-CM | POA: Diagnosis not present

## 2017-02-08 DIAGNOSIS — F418 Other specified anxiety disorders: Secondary | ICD-10-CM | POA: Diagnosis not present

## 2017-02-08 DIAGNOSIS — R269 Unspecified abnormalities of gait and mobility: Secondary | ICD-10-CM | POA: Diagnosis not present

## 2017-02-08 DIAGNOSIS — I129 Hypertensive chronic kidney disease with stage 1 through stage 4 chronic kidney disease, or unspecified chronic kidney disease: Secondary | ICD-10-CM | POA: Diagnosis not present

## 2017-02-08 DIAGNOSIS — Z6841 Body Mass Index (BMI) 40.0 and over, adult: Secondary | ICD-10-CM | POA: Diagnosis not present

## 2017-02-08 DIAGNOSIS — I10 Essential (primary) hypertension: Secondary | ICD-10-CM | POA: Diagnosis not present

## 2017-02-08 DIAGNOSIS — F341 Dysthymic disorder: Secondary | ICD-10-CM | POA: Diagnosis not present

## 2017-02-08 DIAGNOSIS — Z7984 Long term (current) use of oral hypoglycemic drugs: Secondary | ICD-10-CM | POA: Diagnosis not present

## 2017-02-08 DIAGNOSIS — R531 Weakness: Secondary | ICD-10-CM | POA: Diagnosis not present

## 2017-02-08 DIAGNOSIS — Z86718 Personal history of other venous thrombosis and embolism: Secondary | ICD-10-CM | POA: Diagnosis not present

## 2017-02-08 DIAGNOSIS — D631 Anemia in chronic kidney disease: Secondary | ICD-10-CM | POA: Diagnosis not present

## 2017-02-08 DIAGNOSIS — N184 Chronic kidney disease, stage 4 (severe): Secondary | ICD-10-CM | POA: Diagnosis not present

## 2017-02-08 DIAGNOSIS — E113293 Type 2 diabetes mellitus with mild nonproliferative diabetic retinopathy without macular edema, bilateral: Secondary | ICD-10-CM | POA: Diagnosis not present

## 2017-02-08 DIAGNOSIS — F17210 Nicotine dependence, cigarettes, uncomplicated: Secondary | ICD-10-CM | POA: Diagnosis not present

## 2017-02-08 DIAGNOSIS — G8220 Paraplegia, unspecified: Secondary | ICD-10-CM | POA: Diagnosis not present

## 2017-02-08 DIAGNOSIS — F0390 Unspecified dementia without behavioral disturbance: Secondary | ICD-10-CM | POA: Diagnosis not present

## 2017-02-08 DIAGNOSIS — E1122 Type 2 diabetes mellitus with diabetic chronic kidney disease: Secondary | ICD-10-CM | POA: Diagnosis not present

## 2017-02-08 DIAGNOSIS — G4734 Idiopathic sleep related nonobstructive alveolar hypoventilation: Secondary | ICD-10-CM | POA: Diagnosis not present

## 2017-02-08 DIAGNOSIS — E1142 Type 2 diabetes mellitus with diabetic polyneuropathy: Secondary | ICD-10-CM | POA: Diagnosis not present

## 2017-02-08 DIAGNOSIS — J449 Chronic obstructive pulmonary disease, unspecified: Secondary | ICD-10-CM | POA: Diagnosis not present

## 2017-02-08 DIAGNOSIS — C9110 Chronic lymphocytic leukemia of B-cell type not having achieved remission: Secondary | ICD-10-CM | POA: Diagnosis not present

## 2017-02-10 DIAGNOSIS — E1122 Type 2 diabetes mellitus with diabetic chronic kidney disease: Secondary | ICD-10-CM | POA: Diagnosis not present

## 2017-02-10 DIAGNOSIS — I129 Hypertensive chronic kidney disease with stage 1 through stage 4 chronic kidney disease, or unspecified chronic kidney disease: Secondary | ICD-10-CM | POA: Diagnosis not present

## 2017-02-10 DIAGNOSIS — D631 Anemia in chronic kidney disease: Secondary | ICD-10-CM | POA: Diagnosis not present

## 2017-02-10 DIAGNOSIS — R269 Unspecified abnormalities of gait and mobility: Secondary | ICD-10-CM | POA: Diagnosis not present

## 2017-02-10 DIAGNOSIS — N184 Chronic kidney disease, stage 4 (severe): Secondary | ICD-10-CM | POA: Diagnosis not present

## 2017-02-10 DIAGNOSIS — R531 Weakness: Secondary | ICD-10-CM | POA: Diagnosis not present

## 2017-02-13 DIAGNOSIS — N184 Chronic kidney disease, stage 4 (severe): Secondary | ICD-10-CM | POA: Diagnosis not present

## 2017-02-13 DIAGNOSIS — E1122 Type 2 diabetes mellitus with diabetic chronic kidney disease: Secondary | ICD-10-CM | POA: Diagnosis not present

## 2017-02-13 DIAGNOSIS — R269 Unspecified abnormalities of gait and mobility: Secondary | ICD-10-CM | POA: Diagnosis not present

## 2017-02-13 DIAGNOSIS — R531 Weakness: Secondary | ICD-10-CM | POA: Diagnosis not present

## 2017-02-13 DIAGNOSIS — I129 Hypertensive chronic kidney disease with stage 1 through stage 4 chronic kidney disease, or unspecified chronic kidney disease: Secondary | ICD-10-CM | POA: Diagnosis not present

## 2017-02-13 DIAGNOSIS — D631 Anemia in chronic kidney disease: Secondary | ICD-10-CM | POA: Diagnosis not present

## 2017-02-17 DIAGNOSIS — R531 Weakness: Secondary | ICD-10-CM | POA: Diagnosis not present

## 2017-02-17 DIAGNOSIS — I129 Hypertensive chronic kidney disease with stage 1 through stage 4 chronic kidney disease, or unspecified chronic kidney disease: Secondary | ICD-10-CM | POA: Diagnosis not present

## 2017-02-17 DIAGNOSIS — I10 Essential (primary) hypertension: Secondary | ICD-10-CM | POA: Diagnosis not present

## 2017-02-17 DIAGNOSIS — R269 Unspecified abnormalities of gait and mobility: Secondary | ICD-10-CM | POA: Diagnosis not present

## 2017-02-17 DIAGNOSIS — D631 Anemia in chronic kidney disease: Secondary | ICD-10-CM | POA: Diagnosis not present

## 2017-02-17 DIAGNOSIS — F0151 Vascular dementia with behavioral disturbance: Secondary | ICD-10-CM | POA: Diagnosis not present

## 2017-02-17 DIAGNOSIS — E1122 Type 2 diabetes mellitus with diabetic chronic kidney disease: Secondary | ICD-10-CM | POA: Diagnosis not present

## 2017-02-17 DIAGNOSIS — N184 Chronic kidney disease, stage 4 (severe): Secondary | ICD-10-CM | POA: Diagnosis not present

## 2017-02-19 DIAGNOSIS — R531 Weakness: Secondary | ICD-10-CM | POA: Diagnosis not present

## 2017-02-19 DIAGNOSIS — I129 Hypertensive chronic kidney disease with stage 1 through stage 4 chronic kidney disease, or unspecified chronic kidney disease: Secondary | ICD-10-CM | POA: Diagnosis not present

## 2017-02-19 DIAGNOSIS — R269 Unspecified abnormalities of gait and mobility: Secondary | ICD-10-CM | POA: Diagnosis not present

## 2017-02-19 DIAGNOSIS — D631 Anemia in chronic kidney disease: Secondary | ICD-10-CM | POA: Diagnosis not present

## 2017-02-19 DIAGNOSIS — N184 Chronic kidney disease, stage 4 (severe): Secondary | ICD-10-CM | POA: Diagnosis not present

## 2017-02-19 DIAGNOSIS — E1122 Type 2 diabetes mellitus with diabetic chronic kidney disease: Secondary | ICD-10-CM | POA: Diagnosis not present

## 2017-02-21 DIAGNOSIS — Z23 Encounter for immunization: Secondary | ICD-10-CM | POA: Diagnosis not present

## 2017-02-25 DIAGNOSIS — I129 Hypertensive chronic kidney disease with stage 1 through stage 4 chronic kidney disease, or unspecified chronic kidney disease: Secondary | ICD-10-CM | POA: Diagnosis not present

## 2017-02-25 DIAGNOSIS — D631 Anemia in chronic kidney disease: Secondary | ICD-10-CM | POA: Diagnosis not present

## 2017-02-25 DIAGNOSIS — R531 Weakness: Secondary | ICD-10-CM | POA: Diagnosis not present

## 2017-02-25 DIAGNOSIS — E1122 Type 2 diabetes mellitus with diabetic chronic kidney disease: Secondary | ICD-10-CM | POA: Diagnosis not present

## 2017-02-25 DIAGNOSIS — R269 Unspecified abnormalities of gait and mobility: Secondary | ICD-10-CM | POA: Diagnosis not present

## 2017-02-25 DIAGNOSIS — N184 Chronic kidney disease, stage 4 (severe): Secondary | ICD-10-CM | POA: Diagnosis not present

## 2017-02-25 DIAGNOSIS — B351 Tinea unguium: Secondary | ICD-10-CM | POA: Diagnosis not present

## 2017-02-25 DIAGNOSIS — M79675 Pain in left toe(s): Secondary | ICD-10-CM | POA: Diagnosis not present

## 2017-02-25 DIAGNOSIS — M79674 Pain in right toe(s): Secondary | ICD-10-CM | POA: Diagnosis not present

## 2017-02-28 DIAGNOSIS — E1122 Type 2 diabetes mellitus with diabetic chronic kidney disease: Secondary | ICD-10-CM | POA: Diagnosis not present

## 2017-02-28 DIAGNOSIS — D631 Anemia in chronic kidney disease: Secondary | ICD-10-CM | POA: Diagnosis not present

## 2017-02-28 DIAGNOSIS — R531 Weakness: Secondary | ICD-10-CM | POA: Diagnosis not present

## 2017-02-28 DIAGNOSIS — N184 Chronic kidney disease, stage 4 (severe): Secondary | ICD-10-CM | POA: Diagnosis not present

## 2017-02-28 DIAGNOSIS — I129 Hypertensive chronic kidney disease with stage 1 through stage 4 chronic kidney disease, or unspecified chronic kidney disease: Secondary | ICD-10-CM | POA: Diagnosis not present

## 2017-02-28 DIAGNOSIS — R269 Unspecified abnormalities of gait and mobility: Secondary | ICD-10-CM | POA: Diagnosis not present

## 2017-03-03 DIAGNOSIS — I129 Hypertensive chronic kidney disease with stage 1 through stage 4 chronic kidney disease, or unspecified chronic kidney disease: Secondary | ICD-10-CM | POA: Diagnosis not present

## 2017-03-03 DIAGNOSIS — R531 Weakness: Secondary | ICD-10-CM | POA: Diagnosis not present

## 2017-03-03 DIAGNOSIS — D631 Anemia in chronic kidney disease: Secondary | ICD-10-CM | POA: Diagnosis not present

## 2017-03-03 DIAGNOSIS — N184 Chronic kidney disease, stage 4 (severe): Secondary | ICD-10-CM | POA: Diagnosis not present

## 2017-03-03 DIAGNOSIS — E1122 Type 2 diabetes mellitus with diabetic chronic kidney disease: Secondary | ICD-10-CM | POA: Diagnosis not present

## 2017-03-03 DIAGNOSIS — R269 Unspecified abnormalities of gait and mobility: Secondary | ICD-10-CM | POA: Diagnosis not present

## 2017-03-06 DIAGNOSIS — R269 Unspecified abnormalities of gait and mobility: Secondary | ICD-10-CM | POA: Diagnosis not present

## 2017-03-06 DIAGNOSIS — E1122 Type 2 diabetes mellitus with diabetic chronic kidney disease: Secondary | ICD-10-CM | POA: Diagnosis not present

## 2017-03-06 DIAGNOSIS — D631 Anemia in chronic kidney disease: Secondary | ICD-10-CM | POA: Diagnosis not present

## 2017-03-06 DIAGNOSIS — R531 Weakness: Secondary | ICD-10-CM | POA: Diagnosis not present

## 2017-03-06 DIAGNOSIS — N184 Chronic kidney disease, stage 4 (severe): Secondary | ICD-10-CM | POA: Diagnosis not present

## 2017-03-06 DIAGNOSIS — I129 Hypertensive chronic kidney disease with stage 1 through stage 4 chronic kidney disease, or unspecified chronic kidney disease: Secondary | ICD-10-CM | POA: Diagnosis not present

## 2017-04-10 ENCOUNTER — Other Ambulatory Visit: Payer: Self-pay

## 2017-04-10 ENCOUNTER — Encounter (HOSPITAL_COMMUNITY): Payer: Self-pay

## 2017-04-10 ENCOUNTER — Emergency Department (HOSPITAL_COMMUNITY): Payer: Medicare Other

## 2017-04-10 ENCOUNTER — Inpatient Hospital Stay (HOSPITAL_COMMUNITY): Payer: Medicare Other

## 2017-04-10 ENCOUNTER — Inpatient Hospital Stay (HOSPITAL_COMMUNITY)
Admission: EM | Admit: 2017-04-10 | Discharge: 2017-04-15 | DRG: 871 | Disposition: A | Discharge status: Skilled Nursing Facility | Payer: Medicare Other | Attending: Internal Medicine | Admitting: Internal Medicine

## 2017-04-10 DIAGNOSIS — E1122 Type 2 diabetes mellitus with diabetic chronic kidney disease: Secondary | ICD-10-CM | POA: Diagnosis present

## 2017-04-10 DIAGNOSIS — Z833 Family history of diabetes mellitus: Secondary | ICD-10-CM

## 2017-04-10 DIAGNOSIS — E785 Hyperlipidemia, unspecified: Secondary | ICD-10-CM | POA: Diagnosis present

## 2017-04-10 DIAGNOSIS — Z961 Presence of intraocular lens: Secondary | ICD-10-CM | POA: Diagnosis present

## 2017-04-10 DIAGNOSIS — I70209 Unspecified atherosclerosis of native arteries of extremities, unspecified extremity: Secondary | ICD-10-CM | POA: Diagnosis present

## 2017-04-10 DIAGNOSIS — J189 Pneumonia, unspecified organism: Secondary | ICD-10-CM | POA: Diagnosis present

## 2017-04-10 DIAGNOSIS — I350 Nonrheumatic aortic (valve) stenosis: Secondary | ICD-10-CM | POA: Diagnosis present

## 2017-04-10 DIAGNOSIS — N179 Acute kidney failure, unspecified: Secondary | ICD-10-CM | POA: Diagnosis present

## 2017-04-10 DIAGNOSIS — E1065 Type 1 diabetes mellitus with hyperglycemia: Secondary | ICD-10-CM | POA: Diagnosis not present

## 2017-04-10 DIAGNOSIS — R918 Other nonspecific abnormal finding of lung field: Secondary | ICD-10-CM | POA: Diagnosis not present

## 2017-04-10 DIAGNOSIS — R748 Abnormal levels of other serum enzymes: Secondary | ICD-10-CM | POA: Diagnosis present

## 2017-04-10 DIAGNOSIS — E1142 Type 2 diabetes mellitus with diabetic polyneuropathy: Secondary | ICD-10-CM | POA: Diagnosis present

## 2017-04-10 DIAGNOSIS — Y95 Nosocomial condition: Secondary | ICD-10-CM | POA: Diagnosis present

## 2017-04-10 DIAGNOSIS — E782 Mixed hyperlipidemia: Secondary | ICD-10-CM | POA: Diagnosis present

## 2017-04-10 DIAGNOSIS — E1165 Type 2 diabetes mellitus with hyperglycemia: Secondary | ICD-10-CM | POA: Diagnosis present

## 2017-04-10 DIAGNOSIS — K8020 Calculus of gallbladder without cholecystitis without obstruction: Secondary | ICD-10-CM | POA: Diagnosis not present

## 2017-04-10 DIAGNOSIS — K859 Acute pancreatitis without necrosis or infection, unspecified: Secondary | ICD-10-CM | POA: Diagnosis not present

## 2017-04-10 DIAGNOSIS — C9110 Chronic lymphocytic leukemia of B-cell type not having achieved remission: Secondary | ICD-10-CM | POA: Diagnosis present

## 2017-04-10 DIAGNOSIS — I129 Hypertensive chronic kidney disease with stage 1 through stage 4 chronic kidney disease, or unspecified chronic kidney disease: Secondary | ICD-10-CM | POA: Diagnosis present

## 2017-04-10 DIAGNOSIS — D510 Vitamin B12 deficiency anemia due to intrinsic factor deficiency: Secondary | ICD-10-CM | POA: Diagnosis present

## 2017-04-10 DIAGNOSIS — F0390 Unspecified dementia without behavioral disturbance: Secondary | ICD-10-CM | POA: Diagnosis present

## 2017-04-10 DIAGNOSIS — F17210 Nicotine dependence, cigarettes, uncomplicated: Secondary | ICD-10-CM | POA: Diagnosis present

## 2017-04-10 DIAGNOSIS — T68XXXA Hypothermia, initial encounter: Secondary | ICD-10-CM | POA: Diagnosis present

## 2017-04-10 DIAGNOSIS — F339 Major depressive disorder, recurrent, unspecified: Secondary | ICD-10-CM | POA: Diagnosis not present

## 2017-04-10 DIAGNOSIS — Z7984 Long term (current) use of oral hypoglycemic drugs: Secondary | ICD-10-CM

## 2017-04-10 DIAGNOSIS — R41841 Cognitive communication deficit: Secondary | ICD-10-CM | POA: Diagnosis not present

## 2017-04-10 DIAGNOSIS — I358 Other nonrheumatic aortic valve disorders: Secondary | ICD-10-CM | POA: Diagnosis present

## 2017-04-10 DIAGNOSIS — G9341 Metabolic encephalopathy: Secondary | ICD-10-CM | POA: Diagnosis present

## 2017-04-10 DIAGNOSIS — Z7982 Long term (current) use of aspirin: Secondary | ICD-10-CM

## 2017-04-10 DIAGNOSIS — N183 Chronic kidney disease, stage 3 unspecified: Secondary | ICD-10-CM | POA: Diagnosis present

## 2017-04-10 DIAGNOSIS — E162 Hypoglycemia, unspecified: Secondary | ICD-10-CM | POA: Diagnosis not present

## 2017-04-10 DIAGNOSIS — I10 Essential (primary) hypertension: Secondary | ICD-10-CM | POA: Diagnosis present

## 2017-04-10 DIAGNOSIS — R680 Hypothermia, not associated with low environmental temperature: Secondary | ICD-10-CM | POA: Diagnosis present

## 2017-04-10 DIAGNOSIS — R6520 Severe sepsis without septic shock: Secondary | ICD-10-CM | POA: Diagnosis not present

## 2017-04-10 DIAGNOSIS — T68XXXD Hypothermia, subsequent encounter: Secondary | ICD-10-CM | POA: Diagnosis not present

## 2017-04-10 DIAGNOSIS — K219 Gastro-esophageal reflux disease without esophagitis: Secondary | ICD-10-CM | POA: Diagnosis present

## 2017-04-10 DIAGNOSIS — E119 Type 2 diabetes mellitus without complications: Secondary | ICD-10-CM | POA: Diagnosis not present

## 2017-04-10 DIAGNOSIS — C9190 Lymphoid leukemia, unspecified not having achieved remission: Secondary | ICD-10-CM | POA: Diagnosis not present

## 2017-04-10 DIAGNOSIS — Z8673 Personal history of transient ischemic attack (TIA), and cerebral infarction without residual deficits: Secondary | ICD-10-CM | POA: Diagnosis not present

## 2017-04-10 DIAGNOSIS — E875 Hyperkalemia: Secondary | ICD-10-CM | POA: Diagnosis present

## 2017-04-10 DIAGNOSIS — R4182 Altered mental status, unspecified: Secondary | ICD-10-CM | POA: Diagnosis not present

## 2017-04-10 DIAGNOSIS — F0391 Unspecified dementia with behavioral disturbance: Secondary | ICD-10-CM | POA: Diagnosis not present

## 2017-04-10 DIAGNOSIS — I6381 Other cerebral infarction due to occlusion or stenosis of small artery: Secondary | ICD-10-CM | POA: Diagnosis present

## 2017-04-10 DIAGNOSIS — Z79899 Other long term (current) drug therapy: Secondary | ICD-10-CM

## 2017-04-10 DIAGNOSIS — A419 Sepsis, unspecified organism: Secondary | ICD-10-CM | POA: Diagnosis present

## 2017-04-10 DIAGNOSIS — R739 Hyperglycemia, unspecified: Secondary | ICD-10-CM

## 2017-04-10 DIAGNOSIS — I739 Peripheral vascular disease, unspecified: Secondary | ICD-10-CM | POA: Diagnosis present

## 2017-04-10 DIAGNOSIS — E1151 Type 2 diabetes mellitus with diabetic peripheral angiopathy without gangrene: Secondary | ICD-10-CM | POA: Diagnosis present

## 2017-04-10 DIAGNOSIS — M6281 Muscle weakness (generalized): Secondary | ICD-10-CM | POA: Diagnosis not present

## 2017-04-10 DIAGNOSIS — R1312 Dysphagia, oropharyngeal phase: Secondary | ICD-10-CM | POA: Diagnosis not present

## 2017-04-10 LAB — CBG MONITORING, ED
GLUCOSE-CAPILLARY: 288 mg/dL — AB (ref 65–99)
GLUCOSE-CAPILLARY: 452 mg/dL — AB (ref 65–99)

## 2017-04-10 LAB — I-STAT CHEM 8, ED
BUN: 44 mg/dL — AB (ref 6–20)
BUN: 35 mg/dL — AB (ref 6–20)
CHLORIDE: 102 mmol/L (ref 101–111)
CREATININE: 1.8 mg/dL — AB (ref 0.61–1.24)
CREATININE: 1.4 mg/dL — AB (ref 0.61–1.24)
Calcium, Ion: 1.31 mmol/L (ref 1.15–1.40)
Calcium, Ion: 1.21 mmol/L (ref 1.15–1.40)
Chloride: 111 mmol/L (ref 101–111)
Glucose, Bld: 466 mg/dL — ABNORMAL HIGH (ref 65–99)
Glucose, Bld: 267 mg/dL — ABNORMAL HIGH (ref 65–99)
HEMATOCRIT: 36 % — AB (ref 39.0–52.0)
HEMATOCRIT: 31 % — AB (ref 39.0–52.0)
Hemoglobin: 12.2 g/dL — ABNORMAL LOW (ref 13.0–17.0)
Hemoglobin: 10.5 g/dL — ABNORMAL LOW (ref 13.0–17.0)
POTASSIUM: 5.6 mmol/L — AB (ref 3.5–5.1)
Potassium: 3.6 mmol/L (ref 3.5–5.1)
SODIUM: 136 mmol/L (ref 135–145)
Sodium: 144 mmol/L (ref 135–145)
TCO2: 26 mmol/L (ref 22–32)
TCO2: 20 mmol/L — AB (ref 22–32)

## 2017-04-10 LAB — URINALYSIS, ROUTINE W REFLEX MICROSCOPIC
BACTERIA UA: NONE SEEN
Bilirubin Urine: NEGATIVE
Glucose, UA: >=500 mg/dL — AB
Hgb urine dipstick: NEGATIVE
Ketones, ur: NEGATIVE mg/dL
LEUKOCYTES UA: NEGATIVE
Nitrite: NEGATIVE
PROTEIN: 30 mg/dL — AB
RBC / HPF: NONE SEEN RBC/hpf (ref 0–5)
SPECIFIC GRAVITY, URINE: 1.012 (ref 1.005–1.030)
SQUAMOUS EPITHELIAL / LPF: NONE SEEN
pH: 6 (ref 5.0–8.0)

## 2017-04-10 LAB — COMPREHENSIVE METABOLIC PANEL
ALBUMIN: 4.2 g/dL (ref 3.5–5.0)
ALK PHOS: 111 U/L (ref 38–126)
ALT: 12 U/L — AB (ref 17–63)
AST: 28 U/L (ref 15–41)
Anion gap: 8 (ref 5–15)
BUN: 49 mg/dL — AB (ref 6–20)
CALCIUM: 10.2 mg/dL (ref 8.9–10.3)
CHLORIDE: 100 mmol/L — AB (ref 101–111)
CO2: 25 mmol/L (ref 22–32)
CREATININE: 1.92 mg/dL — AB (ref 0.61–1.24)
GFR calc Af Amer: 39 mL/min — ABNORMAL LOW (ref >60)
GFR calc non Af Amer: 34 mL/min — ABNORMAL LOW (ref >60)
GLUCOSE: 448 mg/dL — AB (ref 65–99)
Potassium: 6.3 mmol/L (ref 3.5–5.1)
SODIUM: 133 mmol/L — AB (ref 135–145)
Total Bilirubin: 1.1 mg/dL (ref 0.3–1.2)
Total Protein: 8.1 g/dL (ref 6.5–8.1)

## 2017-04-10 LAB — I-STAT TROPONIN, ED: Troponin i, poc: 0 ng/mL (ref 0.00–0.08)

## 2017-04-10 LAB — CBC
HEMATOCRIT: 34.8 % — AB (ref 39.0–52.0)
Hemoglobin: 11 g/dL — ABNORMAL LOW (ref 13.0–17.0)
MCH: 29.4 pg (ref 26.0–34.0)
MCHC: 31.6 g/dL (ref 30.0–36.0)
MCV: 93 fL (ref 78.0–100.0)
Platelets: 159 10*3/uL (ref 150–400)
RBC: 3.74 MIL/uL — AB (ref 4.22–5.81)
RDW: 13.6 % (ref 11.5–15.5)
WBC: 9.7 10*3/uL (ref 4.0–10.5)

## 2017-04-10 LAB — LIPID PANEL
CHOL/HDL RATIO: 3.3 ratio
Cholesterol: 148 mg/dL (ref 0–200)
HDL: 45 mg/dL (ref >40)
LDL CALC: 91 mg/dL (ref 0–99)
TRIGLYCERIDES: 61 mg/dL (ref <150)
VLDL: 12 mg/dL (ref 0–40)

## 2017-04-10 LAB — LIPASE, BLOOD: Lipase: 312 U/L — ABNORMAL HIGH (ref 11–51)

## 2017-04-10 LAB — I-STAT CG4 LACTIC ACID, ED: LACTIC ACID, VENOUS: 1.27 mmol/L (ref 0.5–1.9)

## 2017-04-10 LAB — PROCALCITONIN: Procalcitonin: <0.1 ng/mL

## 2017-04-10 MED ORDER — VANCOMYCIN HCL IN DEXTROSE 750-5 MG/150ML-% IV SOLN
750.0000 mg | INTRAVENOUS | Status: DC
  Administered 2017-04-11: 750 mg via INTRAVENOUS
  Filled 2017-04-10 (×2): qty 150

## 2017-04-10 MED ORDER — INSULIN ASPART 100 UNIT/ML ~~LOC~~ SOLN
0.0000 [IU] | SUBCUTANEOUS | Status: DC
  Administered 2017-04-11: 3 [IU] via SUBCUTANEOUS
  Administered 2017-04-11: 1 [IU] via SUBCUTANEOUS
  Administered 2017-04-11: 5 [IU] via SUBCUTANEOUS
  Administered 2017-04-11: 1 [IU] via SUBCUTANEOUS
  Administered 2017-04-11: 7 [IU] via SUBCUTANEOUS
  Administered 2017-04-12: 2 [IU] via SUBCUTANEOUS
  Administered 2017-04-12 (×4): 1 [IU] via SUBCUTANEOUS
  Administered 2017-04-12: 5 [IU] via SUBCUTANEOUS
  Administered 2017-04-13: 2 [IU] via SUBCUTANEOUS
  Administered 2017-04-13: 3 [IU] via SUBCUTANEOUS
  Administered 2017-04-13: 2 [IU] via SUBCUTANEOUS
  Filled 2017-04-10 (×3): qty 1

## 2017-04-10 MED ORDER — SODIUM CHLORIDE 0.9 % IV SOLN
INTRAVENOUS | Status: DC
  Administered 2017-04-11 – 2017-04-13 (×4): via INTRAVENOUS

## 2017-04-10 MED ORDER — VANCOMYCIN HCL IN DEXTROSE 1-5 GM/200ML-% IV SOLN
1000.0000 mg | Freq: Once | INTRAVENOUS | Status: AC
  Administered 2017-04-10: 1000 mg via INTRAVENOUS
  Filled 2017-04-10: qty 200

## 2017-04-10 MED ORDER — SODIUM CHLORIDE 0.9 % IV BOLUS (SEPSIS)
1000.0000 mL | Freq: Once | INTRAVENOUS | Status: AC
  Administered 2017-04-10: 1000 mL via INTRAVENOUS

## 2017-04-10 MED ORDER — IOPAMIDOL (ISOVUE-300) INJECTION 61%
INTRAVENOUS | Status: AC
  Filled 2017-04-10: qty 30

## 2017-04-10 MED ORDER — INSULIN ASPART 100 UNIT/ML ~~LOC~~ SOLN
10.0000 [IU] | Freq: Once | SUBCUTANEOUS | Status: AC
  Administered 2017-04-10: 10 [IU] via SUBCUTANEOUS
  Filled 2017-04-10: qty 1

## 2017-04-10 MED ORDER — HYDRALAZINE HCL 50 MG PO TABS
50.0000 mg | ORAL_TABLET | Freq: Three times a day (TID) | ORAL | Status: DC
  Administered 2017-04-11: 50 mg via ORAL
  Filled 2017-04-10 (×2): qty 1

## 2017-04-10 MED ORDER — AMLODIPINE BESYLATE 5 MG PO TABS
5.0000 mg | ORAL_TABLET | Freq: Every day | ORAL | Status: DC
  Administered 2017-04-11: 5 mg via ORAL
  Filled 2017-04-10: qty 1

## 2017-04-10 MED ORDER — ENOXAPARIN SODIUM 40 MG/0.4ML ~~LOC~~ SOLN
40.0000 mg | Freq: Once | SUBCUTANEOUS | Status: AC
  Administered 2017-04-11: 40 mg via SUBCUTANEOUS
  Filled 2017-04-10: qty 0.4

## 2017-04-10 MED ORDER — DEXTROSE 5 % IV SOLN
1.0000 g | Freq: Two times a day (BID) | INTRAVENOUS | Status: DC
  Administered 2017-04-11 – 2017-04-13 (×6): 1 g via INTRAVENOUS
  Filled 2017-04-10 (×8): qty 1

## 2017-04-10 MED ORDER — SODIUM CHLORIDE 0.9 % IV SOLN
1.0000 g | Freq: Once | INTRAVENOUS | Status: AC
  Administered 2017-04-10: 1 g via INTRAVENOUS
  Filled 2017-04-10: qty 10

## 2017-04-10 MED ORDER — IOPAMIDOL (ISOVUE-300) INJECTION 61%: 30.0000 mL | Freq: Once | INTRAVENOUS | Status: DC | PRN

## 2017-04-10 MED ORDER — DEXTROSE 5 % IV SOLN
2.0000 g | Freq: Once | INTRAVENOUS | Status: AC
  Administered 2017-04-10: 2 g via INTRAVENOUS
  Filled 2017-04-10: qty 2

## 2017-04-10 NOTE — ED Provider Notes (Signed)
[facility] DEPT Provider Note   CSN: [id number] Arrival date & time: 04/10/17  1119     History   Chief Complaint Chief Complaint  Patient presents with  . Hyperglycemia    HPI Manuel Randolph Sr. is a 72 y.o. male.  HPI  72 y.o. male with a hx of DM, HTN, HLD, presents to the Emergency Department today due to hyperglycemia. Pt presents from [facility] due to weakness and hyperglycemia. Notes lethargy and weakness x several days. Denies cough/congestion. No N/V/D. No CP/SOB/ABD pain. Pt with hx dementia and is alert to person only. This is baseline. No other symptoms noted   Past Medical History:  Diagnosis Date  . Anemia   . Diabetes ([place])   . GERD (gastroesophageal reflux disease)   . Hyperlipidemia   . Hypertension   . Leukemia [facility])     Patient Active Problem List   Diagnosis Date Noted  . Rhabdomyolysis 11/14/2016  . Multiple lacunar infarcts 11/06/2016  . Small vessel disease, cerebrovascular 11/06/2016  . Syncope 09/13/2016  . Dermatitis 03/01/2016  . Anemia, pernicious 07/04/2015  . Hip pain 07/04/2015  . Aortic valve sclerosis 02/19/2015  . Chronic lymphocytic leukemia ([place]) 02/19/2015  . DM type 2 with diabetic peripheral neuropathy ([place]) 02/19/2015  . Essential (primary) hypertension 02/19/2015  . DM type 2 with diabetic mixed hyperlipidemia ([place]) 02/19/2015  . Diabetic peripheral neuropathy associated with type 2 diabetes mellitus ([place]) 02/19/2015  . Bleeding ulcer 02/19/2015  . Peripheral vascular disease ([place]) 02/19/2015  . 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 Encounter for screening for malignant neoplasm of prostate 02/19/2015  . Type 2 diabetes mellitus with stage 3 chronic kidney disease ([place]) 02/19/2015  . Compulsive tobacco user syndrome 02/19/2015  . 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  . Aortic valve defect 12/22/2012  . Atherosclerosis of native artery of extremity  ([place]) 11/13/2011    Past Surgical History:  Procedure Laterality Date  . CATARACT EXTRACTION W/PHACO Left 09/13/2014   Procedure: CATARACT EXTRACTION PHACO AND INTRAOCULAR LENS PLACEMENT (IOC);  Surgeon: Birder Robson, MD;  Location: [facility] ORS;  Service: Ophthalmology;  Laterality: Left;  [place] 01:03   . COLONOSCOPY  2014  . POPLITEAL ARTERY ANGIOPLASTY Left 2014  . PTCA Right    leg  . UPPER GASTROINTESTINAL ENDOSCOPY  2014       Home Medications    Prior to Admission medications   Medication Sig Start Date End Date Taking? Authorizing Provider  amLODipine (NORVASC) 5 MG tablet Take 1 tablet (5 mg total) by mouth daily. 11/18/16   [name], MD  aspirin 81 MG tablet Take 1 tablet (81 mg total) by mouth daily. 08/14/16   [name], MD  gabapentin (NEURONTIN) 300 MG capsule TAKE 1 CAPSULE BY MOUTH 3  TIMES DAILY 08/30/16   [name], MD  glucose blood test strip Use as instructed 03/06/15   [name], MD  hydrALAZINE (APRESOLINE) 50 MG tablet Take 1 tablet (50 mg total) by mouth every 8 (eight) hours. 11/06/16   [name], MD  Lancets 28G MISC 1 each by Does not apply route daily. 03/06/15   [name], MD  lisinopril (PRINIVIL,ZESTRIL) 40 MG tablet Take 1 tablet (40 mg total) by mouth daily. 11/18/16   [name], MD  metFORMIN (GLUCOPHAGE) 500 MG tablet Take 1 tablet (500 mg total) by mouth 2 (two) times daily with a meal. Patient taking differently: Take 1,000 mg by mouth  2 (two) times daily with a meal.  04/16/16 04/16/17  [name], MD  mupirocin ointment Drue Stager) 2 % Apply to affected area 3 times daily 08/14/16 08/14/17  [name], MD  QUEtiapine (SEROQUEL) 25 MG tablet Take 0.5 tablets (12.5 mg total) by mouth at bedtime. 11/06/16   [name], MD  simvastatin (ZOCOR) 20 MG tablet TAKE 1 TABLET BY MOUTH AT  BEDTIME Patient not taking: Reported on 11/16/2016 08/30/16   [name], MD  triamcinolone cream (KENALOG) 0.1 %  APPLY TO AFFECTED AREA TWICE A DAY 08/07/16   Daleen Bo, MD    Family History Family History  Problem Relation Age of Onset  . Diabetes Mother   . Diabetes Father     Social History Social History   Tobacco Use  . Smoking status: Current Every Day Smoker    Packs/day: 0.50    Types: Cigarettes  . Smokeless tobacco: Never Used  Substance Use Topics  . Alcohol use: No    Alcohol/week: 0.0 oz  . Drug use: No     Allergies   Patient has no known allergies.   Review of Systems Review of Systems ROS reviewed and all are negative for acute change except as noted in the HPI  Physical Exam Updated Vital Signs BP 97/62 (BP Location: Right Arm)   Pulse 73   Temp 98.5 F (36.9 C) (Oral)   Resp 18   Wt 59.4 kg (131 lb)   SpO2 97%   BMI 19.92 kg/m   Physical Exam  Constitutional: Vital signs are normal. He appears well-developed and well-nourished. He appears ill. No distress.  Responsive to questioning  HENT:  Head: Normocephalic and atraumatic.  Right Ear: Hearing, tympanic membrane, external ear and ear canal normal.  Left Ear: Hearing, tympanic membrane, external ear and ear canal normal.  Nose: Nose normal.  Mouth/Throat: Uvula is midline, oropharynx is clear and moist and mucous membranes are normal. No trismus in the jaw. No oropharyngeal exudate, posterior oropharyngeal erythema or tonsillar abscesses.  Eyes: Conjunctivae and EOM are normal. Pupils are equal, round, and reactive to light.  Neck: Normal range of motion. Neck supple. No tracheal deviation present.  Cardiovascular: Normal rate, regular rhythm, S1 normal, S2 normal, normal heart sounds, intact distal pulses and normal pulses.  Pulmonary/Chest: Effort normal and breath sounds normal. No respiratory distress. He has no decreased breath sounds. He has no wheezes. He has no rhonchi. He has no rales.  Abdominal: Normal appearance and bowel sounds are normal. There is no tenderness.  Musculoskeletal:  Normal range of motion.  Neurological: He is alert. He is disoriented (baseline).  Skin: Skin is warm and dry.  Psychiatric: He has a normal mood and affect. His speech is normal and behavior is normal. Thought content normal.  Nursing note and vitals reviewed.  ED Treatments / Results  Labs (all labs ordered are listed, but only abnormal results are displayed) Labs Reviewed  CBC - Abnormal; Notable for the following components:      Result Value   RBC 3.74 (*)    Hemoglobin 11.0 (*)    HCT 34.8 (*)    All other components within normal limits  URINALYSIS, ROUTINE W REFLEX MICROSCOPIC - Abnormal; Notable for the following components:   Glucose, UA >=500 (*)    Protein, ur 30 (*)    All other components within normal limits  COMPREHENSIVE METABOLIC PANEL - Abnormal; Notable for the following components:   Sodium 133 (*)  Potassium 6.3 (*)    Chloride 100 (*)    Glucose, Bld 448 (*)    BUN 49 (*)    Creatinine, Ser 1.92 (*)    ALT 12 (*)    GFR calc non Af Amer 34 (*)    GFR calc Af Amer 39 (*)    All other components within normal limits  LIPASE, BLOOD - Abnormal; Notable for the following components:   Lipase 312 (*)    All other components within normal limits  CBG MONITORING, ED - Abnormal; Notable for the following components:   Glucose-Capillary 452 (*)    All other components within normal limits  I-STAT CHEM 8, ED - Abnormal; Notable for the following components:   Potassium 5.6 (*)    BUN 44 (*)    Creatinine, Ser 1.80 (*)    Glucose, Bld 466 (*)    Hemoglobin 12.2 (*)    HCT 36.0 (*)    All other components within normal limits  LIPID PANEL  I-STAT CG4 LACTIC ACID, ED  I-STAT TROPONIN, ED  I-STAT CHEM 8, ED    EKG  EKG Interpretation  Date/Time:  Thursday April 10 2017 12:07:21 EST Ventricular Rate:  57 PR Interval:    QRS Duration: 137 QT Interval:  465 QTC Calculation: 453 R Axis:   36 Text Interpretation:  Sinus rhythm Right bundle branch  block Borderline ST elevation, anterior leads Confirmed by Tanna Furry [id number]) on 04/10/2017 1:24:47 PM       Radiology Ct Abdomen Pelvis Wo Contrast  Result Date: 04/10/2017 CLINICAL DATA:  72 year old male with history of generalized weakness and hyperglycemia. EXAM: CT ABDOMEN AND PELVIS WITHOUT CONTRAST TECHNIQUE: Multidetector CT imaging of the abdomen and pelvis was performed following the standard protocol without IV contrast. COMPARISON:  No priors. FINDINGS: Lower chest: Extensive areas of septal thickening, thickening of the peribronchovascular interstitium, regional areas of architectural distortion and patchy areas of cylindrical bronchiectasis are noted throughout the lung bases bilaterally. A few scattered pulmonary nodules are noted in the lung bases measuring up to 6 mm in the left lower lobe and 7 mm in the periphery of the right middle lobe. Calcifications of the aortic valve and mitral annulus. Aortic atherosclerosis. Atherosclerotic calcifications in the left anterior descending and left circumflex coronary arteries. Hepatobiliary: No definite cystic or solid hepatic lesions are confidently identified on today's noncontrast CT examination. Numerous tiny calcified gallstones lie dependently in the gallbladder. No findings to suggest an associated cholecystitis. Pancreas: No definite pancreatic mass or peripancreatic inflammatory changes are noted on today's noncontrast CT examination. Spleen: Unremarkable. Adrenals/Urinary Tract: Multiple calcifications in the kidneys bilaterally appear to be vascular. 12 mm exophytic intermediate attenuation (41 HU) lesion extending off the posterior aspect of the lower pole of the left kidney, incompletely characterized on today's noncontrast CT examination, but statistically likely a small proteinaceous cyst. No hydroureteronephrosis. Urinary bladder is normal in appearance. Bilateral adrenal glands are normal in appearance. Stomach/Bowel: Normal  appearance of the stomach. No pathologic dilatation of small bowel or colon. Normal appendix. Vascular/Lymphatic: Aortic atherosclerosis, without definite aneurysm in the abdominal or pelvic vasculature. No lymphadenopathy noted in the abdomen or pelvis. Reproductive: Prostate gland and seminal vesicles are unremarkable in appearance. Postoperative changes of penile implant noted, with reservoir in the lower right hemipelvis. Other: No significant volume of ascites.  No pneumoperitoneum. Musculoskeletal: There are no aggressive appearing lytic or blastic lesions noted in the visualized portions of the skeleton. Chronic appearing compression fracture of T12 with  approximately 40% loss of anterior vertebral body height. IMPRESSION: 1. No acute findings noted in the abdomen or pelvis to account for the patient's symptoms. 2. Cholelithiasis without evidence of acute cholecystitis at this time. 3. Aortic atherosclerosis, in addition to at least 2 vessel coronary artery disease. Assessment for potential risk factor modification, dietary therapy or pharmacologic therapy may be warranted, if clinically indicated. 4. There are calcifications of the aortic valve and mitral annulus. Echocardiographic correlation for evaluation of potential valvular dysfunction may be warranted if clinically indicated. 5. Additional incidental findings, as above. Aortic Atherosclerosis (ICD10-I70.0). Electronically Signed   By: Vinnie Langton M.D.   On: 04/10/2017 14:52   Dg Chest 2 View  Result Date: 04/10/2017 CLINICAL DATA:  Hypothermia EXAM: CHEST  2 VIEW COMPARISON:  11/13/2016 chest radiograph. FINDINGS: Slightly low lung volumes. Stable cardiomediastinal silhouette with normal heart size. No pneumothorax. No pleural effusion. No pulmonary edema. Hazy and curvilinear bibasilar lung opacities. IMPRESSION: Hazy and curvilinear bibasilar lung opacities with slightly low lung volumes. Favor atelectasis, difficult to exclude aspiration  or developing pneumonia. Short-term follow-up PA and lateral chest radiographs advised. Electronically Signed   By: Ilona Sorrel M.D.   On: 04/10/2017 12:47    Procedures Procedures (including critical care time)  Medications Ordered in ED Medications  iopamidol (ISOVUE-300) 61 % injection 30 mL (not administered)  iopamidol (ISOVUE-300) 61 % injection (not administered)  sodium chloride 0.9 % bolus 1,000 mL (0 mLs Intravenous Stopped 04/10/17 1448)  sodium chloride 0.9 % bolus 1,000 mL (0 mLs Intravenous Stopped 04/10/17 1448)  insulin aspart (novoLOG) injection 10 Units (10 Units Subcutaneous Given 04/10/17 1338)  calcium gluconate 1 g in sodium chloride 0.9 % 100 mL IVPB (0 g Intravenous Stopped 04/10/17 1448)     Initial Impression / Assessment and Plan / ED Course  I have reviewed the triage vital signs and the nursing notes.  Pertinent labs & imaging results that were available during my care of the patient were reviewed by me and considered in my medical decision making (see chart for details).  Final Clinical Impressions(s) / ED Diagnoses  {I have reviewed and evaluated the relevant laboratory values. {I have reviewed and evaluated the relevant imaging studies. {I have interpreted the relevant EKG. {I have reviewed the relevant previous healthcare records. {I have reviewed EMS Documentation. {I obtained HPI from historian. {Patient discussed with supervising physician.  ED Course:  Assessment: Pt is a 72 y.o. male with a hx of DM, HTN, HLD, presents to the Emergency Department today due to hyperglycemia. Pt [facility] due to weakness and hyperglycemia. Notes lethargy and weakness x several days. Denies cough/congestion. No N/V/D. No CP/SOB/ABD pain. Pt with hx dementia and is alert to person only. This is baseline. On exam, pt in NAD. Nontoxic/nonseptic appearing. VSS other than Rectal temp 51F. Placed on bear hugger. Lungs CTA. Heart RRR. Abdomen nontender soft. .  istat lactic 1.27. CBC unremarkable. Trop negative. CMP with potassium 6.3. Glucose 448. Creatinine 1.92 (mild elevation from baseline), Lipase 312. UA unremarkable. CXR unremarkable. Given 2L NS bolus, 10U SQ insulin and 1 amp calcium gluconate in ED due to EKG changes. CT Abdomen ordered due to new onset Lipase elevation which was negative for acute abnormality. Seen by supervising physician. Repeat iStat chem pending and Repeat EKG with improved T waves. No widening of QRS. Plan is to [place].   Disposition/Plan:  Admit Pt acknowledges and agrees with plan  Supervising Physician Tanna Furry, MD  Final  diagnoses:  Hyperkalemia  Acute pancreatitis without infection or necrosis, unspecified pancreatitis type  Hyperglycemia    ED Discharge Orders    None       Shary Decamp, PA-C 04/10/17 1455    Tanna Furry, MD 04/11/17 [phone]

## 2017-04-10 NOTE — ED Notes (Signed)
Patient transported to x-ray. ?

## 2017-04-10 NOTE — ED Notes (Signed)
Bed: WA07 Expected date:  Expected time:  Means of arrival:  Comments: EMS-hyperglycemia

## 2017-04-10 NOTE — H&P (Signed)
History and Physical    [name] EPP:[id number] DOB: 1945-12-07 DOA: 04/10/2017  PCP: [name], MD  Patient coming from: snf  Chief Complaint:  ams  HPI: Manuel Randolph. is a 72 y.o. male with medical history significant of dementia, dm, htn, [name] sent in from SNF for lethargy and high glucose level.  Pt cannot provide any history.  Per ED report, pt was found to have hyperkalemia with ekg changes which have both improved with ivf and treatment in the ED along with resolving aki.  Pt temp 93 F and with barehugger on.  Reported initially that cxr and ua were normal.  Pt was referred for admission for tele monitoring to ensure no further ekg changes and hyperkalemia remained resolved.  Review of Systems: unobtainable due to pt dementia  Past Medical History:  Diagnosis Date  . Anemia   . Diabetes ([place])   . GERD (gastroesophageal reflux disease)   . Hyperlipidemia   . Hypertension   . Leukemia [facility])     Past Surgical History:  Procedure Laterality Date  . CATARACT EXTRACTION W/PHACO Left 09/13/2014   Procedure: CATARACT EXTRACTION PHACO AND INTRAOCULAR LENS PLACEMENT (IOC);  Surgeon: Birder Robson, MD;  Location: [facility] ORS;  Service: Ophthalmology;  Laterality: Left;  [place] 01:03   . COLONOSCOPY  2014  . POPLITEAL ARTERY ANGIOPLASTY Left 2014  . PTCA Right    leg  . UPPER GASTROINTESTINAL ENDOSCOPY  2014     reports that he has been smoking cigarettes.  He has been smoking about 0.50 packs per day. he has never used smokeless tobacco. He reports that he does not drink alcohol or use drugs.  No Known Allergies  Family History  Problem Relation Age of Onset  . Diabetes Mother   . Diabetes Father     Prior to Admission medications   Medication Sig Start Date End Date Taking? Authorizing Provider  acetaminophen (TYLENOL) 500 MG tablet Take 500 mg by mouth every 4 (four) hours as needed for mild pain, fever or headache. NOT TO EXCEED 2000 MG IN 24 HOURS    Yes [provider]  alum & mag hydroxide-simeth (MINTOX) 606-301-60 MG/5ML suspension Take 30 mLs by mouth every 6 (six) hours as needed for indigestion or heartburn. NOT TO EXCEED 4 DOSES IN 24 HOURS   Yes [provider]  Amino Acids-Protein Hydrolys (FEEDING SUPPLEMENT, PRO-STAT SUGAR FREE 64,) LIQD Take 30 mLs by mouth 3 (three) times daily with meals.   Yes [provider]  amLODipine (NORVASC) 5 MG tablet Take 1 tablet (5 mg total) by mouth daily. 11/18/16  Yes [name], MD  aspirin 81 MG tablet Take 1 tablet (81 mg total) by mouth daily. 08/14/16  Yes [name], MD  gabapentin (NEURONTIN) 300 MG capsule TAKE 1 CAPSULE BY MOUTH 3  TIMES DAILY Patient taking differently: TAKE 300 mg BY MOUTH 3  TIMES DAILY 08/30/16  Yes [name], MD  guaifenesin (ROBITUSSIN) 100 MG/5ML syrup Take 200 mg by mouth 4 (four) times daily as needed for cough. NOT TO EXCEED 4 DOSES IN 24 HOURS   Yes [provider]  hydrALAZINE (APRESOLINE) 50 MG tablet Take 1 tablet (50 mg total) by mouth every 8 (eight) hours. 11/06/16  Yes [name], MD  lisinopril (PRINIVIL,ZESTRIL) 40 MG tablet Take 1 tablet (40 mg total) by mouth daily. 11/18/16  Yes [name], MD  loperamide (IMODIUM) 2 MG capsule Take 2 mg by mouth  as needed for diarrhea or loose stools. DO NOT EXCEED 8 DOSES IN 24 HOURS   Yes [provider]  metFORMIN (GLUCOPHAGE) 500 MG tablet Take 1 tablet (500 mg total) by mouth 2 (two) times daily with a meal. Patient taking differently: Take 500-1,000 mg by mouth 2 (two) times daily with a meal.  04/16/16 04/16/17 Yes [name], MD  Multiple Vitamin (MULTIVITAMIN WITH MINERALS) TABS tablet Take 1 tablet by mouth daily.   Yes [provider]  mupirocin ointment (BACTROBAN) 2 % Apply to affected area 3 times daily Patient taking differently: Apply 1 application topically 2 (two) times daily.  08/14/16 08/14/17 Yes [name], MD    neomycin-bacitracin-polymyxin (NEOSPORIN) ointment Apply 1 application topically as needed for wound care.   Yes [provider]  PARoxetine (PAXIL) 10 MG tablet Take 10 mg by mouth at bedtime.   Yes [provider]  QUEtiapine (SEROQUEL) 25 MG tablet Take 0.5 tablets (12.5 mg total) by mouth at bedtime. 11/06/16  Yes [name], MD  simvastatin (ZOCOR) 20 MG tablet TAKE 1 TABLET BY MOUTH AT  BEDTIME Patient taking differently: TAKE 20 mg BY MOUTH AT  BEDTIME 08/30/16  Yes [name], MD  vitamin C (ASCORBIC ACID) 250 MG tablet Take 500 mg by mouth 2 (two) times daily.   Yes [provider]  zinc sulfate 220 (50 Zn) MG capsule Take 220 mg by mouth daily.   Yes [provider]  glucose blood test strip Use as instructed 03/06/15   [name], MD  Lancets 28G MISC 1 each by Does not apply route daily. 03/06/15   [name], MD  magnesium hydroxide (MILK OF MAGNESIA) 400 MG/5ML suspension Take 30 mLs by mouth at bedtime as needed for mild constipation or moderate constipation.    [provider]  triamcinolone cream (KENALOG) 0.1 % APPLY TO AFFECTED AREA TWICE A DAY Patient taking differently: Apply 1 application topically 2 (two) times daily.  08/07/16   Daleen Bo, MD    Physical Exam: Vitals:   04/10/17 1330 04/10/17 1400 04/10/17 1454 04/10/17 1500  BP: (!) 173/63 (!) 158/57 (!) 167/64 (!) 175/65  Pulse: (!) 57 (!) 59 63 60  Resp: (!) 7 (!) 8 (!) 9 (!) 9  Temp:      TempSrc:      SpO2: 99% 100% 100% 100%  Weight:          Constitutional: NAD, calm, comfortable appearing but lethargic, cannot follow commands.  Unclear baseline Vitals:   04/10/17 1330 04/10/17 1400 04/10/17 1454 04/10/17 1500  BP: (!) 173/63 (!) 158/57 (!) 167/64 (!) 175/65  Pulse: (!) 57 (!) 59 63 60  Resp: (!) 7 (!) 8 (!) 9 (!) 9  Temp:      TempSrc:      SpO2: 99% 100% 100% 100%  Weight:       Eyes: PERRL, lids and conjunctivae  normal ENMT: Mucous membranes are moist. Posterior pharynx clear of any exudate or lesions.Normal dentition.  Neck: normal, supple, no masses, no thyromegaly Respiratory: clear to auscultation bilaterally, no wheezing, no crackles. Normal respiratory effort. No accessory muscle use.  Cardiovascular: Regular rate and rhythm, no murmurs / rubs / gallops. No extremity edema. 2+ pedal pulses. No carotid bruits.  Abdomen: no tenderness, no masses palpated. No hepatosplenomegaly. Bowel sounds positive.  Musculoskeletal: no clubbing / cyanosis. No joint deformity upper and lower extremities. Good ROM, no contractures. Normal muscle tone.  Skin: no rashes, lesions, ulcers. No induration Neurologic: moving all extremities.  Demented, cannot follow commands, no focal neuro deficits Psychiatric:  Not agitated  Labs on Admission: I have personally reviewed following labs and imaging studies  CBC: Recent Labs  Lab 04/10/17 1220 04/10/17 1232 04/10/17 1500  WBC 9.7  --   --   HGB 11.0* 12.2* 10.5*  HCT 34.8* 36.0* 31.0*  MCV 93.0  --   --   PLT 159  --   --    Basic Metabolic Panel: Recent Labs  Lab 04/10/17 1230 04/10/17 1232 04/10/17 1500  NA 133* 136 144  K 6.3* 5.6* 3.6  CL 100* 102 111  CO2 25  --   --   GLUCOSE 448* 466* 267*  BUN 49* 44* 35*  CREATININE 1.92* 1.80* 1.40*  CALCIUM 10.2  --   --    GFR: Estimated Creatinine Clearance: 41.3 mL/min (A) (by C-G formula based on SCr of 1.4 mg/dL (H)). Liver Function Tests: Recent Labs  Lab 04/10/17 1230  AST 28  ALT 12*  ALKPHOS 111  BILITOT 1.1  PROT 8.1  ALBUMIN 4.2   Recent Labs  Lab 04/10/17 1230  LIPASE 312*   No results for input(s): AMMONIA in the last 168 hours. Coagulation Profile: No results for input(s): INR, PROTIME in the last 168 hours. Cardiac Enzymes: No results for input(s): CKTOTAL, CKMB, CKMBINDEX, TROPONINI in the last 168 hours. BNP (last 3 results) No results for input(s): PROBNP in the last  8760 hours. HbA1C: No results for input(s): HGBA1C in the last 72 hours. CBG: Recent Labs  Lab 04/10/17 1128  GLUCAP 452*   Lipid Profile: Recent Labs    04/10/17 1230  CHOL 148  HDL 45  LDLCALC 91  TRIG 61  CHOLHDL 3.3   Thyroid Function Tests: No results for input(s): TSH, T4TOTAL, FREET4, T3FREE, THYROIDAB in the last 72 hours. Anemia Panel: No results for input(s): VITAMINB12, FOLATE, FERRITIN, TIBC, IRON, RETICCTPCT in the last 72 hours. Urine analysis:    Component Value Date/Time   COLORURINE YELLOW 04/10/2017 1306   APPEARANCEUR CLEAR 04/10/2017 1306   APPEARANCEUR Clear 02/18/2013 0735   LABSPEC 1.012 04/10/2017 1306   LABSPEC 1.008 02/18/2013 0735   PHURINE 6.0 04/10/2017 1306   GLUCOSEU >=500 (A) 04/10/2017 1306   GLUCOSEU Negative 02/18/2013 0735   HGBUR NEGATIVE 04/10/2017 1306   BILIRUBINUR NEGATIVE 04/10/2017 1306   BILIRUBINUR Negative 02/18/2013 [place] 04/10/2017 1306   PROTEINUR 30 (A) 04/10/2017 1306   NITRITE NEGATIVE 04/10/2017 1306   LEUKOCYTESUR NEGATIVE 04/10/2017 1306   LEUKOCYTESUR Negative 02/18/2013 0735   Sepsis Labs: !!!!!!!!!!!!!!!!!!!!!!!!!!!!!!!!!!!!!!!!!!!! @LABRCNTIP (procalcitonin:4,lacticidven:4) )No results found for this or any previous visit (from the past 240 hour(s)).   Radiological Exams on Admission: Ct Abdomen Pelvis Wo Contrast  Result Date: 04/10/2017 CLINICAL DATA:  72 year old male with history of generalized weakness and hyperglycemia. EXAM: CT ABDOMEN AND PELVIS WITHOUT CONTRAST TECHNIQUE: Multidetector CT imaging of the abdomen and pelvis was performed following the standard protocol without IV contrast. COMPARISON:  No priors. FINDINGS: Lower chest: Extensive areas of septal thickening, thickening of the peribronchovascular interstitium, regional areas of architectural distortion and patchy areas of cylindrical bronchiectasis are noted throughout the lung bases bilaterally. A few scattered  pulmonary nodules are noted in the lung bases measuring up to 6 mm in the left lower lobe and 7 mm in the periphery of the right middle lobe. Calcifications of the aortic valve and mitral annulus. Aortic atherosclerosis.  Atherosclerotic calcifications in the left anterior descending and left circumflex coronary arteries. Hepatobiliary: No definite cystic or solid hepatic lesions are confidently identified on today's noncontrast CT examination. Numerous tiny calcified gallstones lie dependently in the gallbladder. No findings to suggest an associated cholecystitis. Pancreas: No definite pancreatic mass or peripancreatic inflammatory changes are noted on today's noncontrast CT examination. Spleen: Unremarkable. Adrenals/Urinary Tract: Multiple calcifications in the kidneys bilaterally appear to be vascular. 12 mm exophytic intermediate attenuation (41 HU) lesion extending off the posterior aspect of the lower pole of the left kidney, incompletely characterized on today's noncontrast CT examination, but statistically likely a small proteinaceous cyst. No hydroureteronephrosis. Urinary bladder is normal in appearance. Bilateral adrenal glands are normal in appearance. Stomach/Bowel: Normal appearance of the stomach. No pathologic dilatation of small bowel or colon. Normal appendix. Vascular/Lymphatic: Aortic atherosclerosis, without definite aneurysm in the abdominal or pelvic vasculature. No lymphadenopathy noted in the abdomen or pelvis. Reproductive: Prostate gland and seminal vesicles are unremarkable in appearance. Postoperative changes of penile implant noted, with reservoir in the lower right hemipelvis. Other: No significant volume of ascites.  No pneumoperitoneum. Musculoskeletal: There are no aggressive appearing lytic or blastic lesions noted in the visualized portions of the skeleton. Chronic appearing compression fracture of T12 with approximately 40% loss of anterior vertebral body height. IMPRESSION: 1.  No acute findings noted in the abdomen or pelvis to account for the patient's symptoms. 2. Cholelithiasis without evidence of acute cholecystitis at this time. 3. Aortic atherosclerosis, in addition to at least 2 vessel coronary artery disease. Assessment for potential risk factor modification, dietary therapy or pharmacologic therapy may be warranted, if clinically indicated. 4. There are calcifications of the aortic valve and mitral annulus. Echocardiographic correlation for evaluation of potential valvular dysfunction may be warranted if clinically indicated. 5. Additional incidental findings, as above. Aortic Atherosclerosis (ICD10-I70.0). Electronically Signed   By: Vinnie Langton M.D.   On: 04/10/2017 14:52   Dg Chest 2 View  Result Date: 04/10/2017 CLINICAL DATA:  Hypothermia EXAM: CHEST  2 VIEW COMPARISON:  11/13/2016 chest radiograph. FINDINGS: Slightly low lung volumes. Stable cardiomediastinal silhouette with normal heart size. No pneumothorax. No pleural effusion. No pulmonary edema. Hazy and curvilinear bibasilar lung opacities. IMPRESSION: Hazy and curvilinear bibasilar lung opacities with slightly low lung volumes. Favor atelectasis, difficult to exclude aspiration or developing pneumonia. Short-term follow-up PA and lateral chest radiographs advised. Electronically Signed   By: Ilona Sorrel M.D.   On: 04/10/2017 12:47    EKG: Independently reviewed. nsr rbbb Old chart reviewed cxr reviewed bibasilar opacities Case discussed with tyler PA in ED   Assessment/Plan 72 yo male with metabolic encephalopathy and  Hypothermia with likely sepsis from HCAP  Principal Problem:   Sepsis ([place]) probable - his ams with hypothermia with cxr abnormalities are concerning for hcap and sepsis.  Ck procalcitonin levels.  Lactic nml.  bp and hr normal.  Place on vanc and cefepime.  ua is pending.  Ct abd/pelvis shows no acute process  Active Problems:   Hypothermia- barehugger   Hyperkalemia-  resolved with ivf  In ED along with peaked t waves have resolved   Acute metabolic encephalopathy- ck ct head, but likely due to infectious process   HCAP (healthcare-associated pneumonia)- abx as above   Aortic valve sclerosis- noted, stable   Chronic lymphocytic leukemia ([name])- noted   Essential (primary) hypertension- cont bp meds   Peripheral vascular disease ([place])- stable   Type 2 diabetes mellitus with stage 3 chronic kidney  disease ([place])- ssi   Anemia, pernicious- at baseline   Multiple lacunar infarcts- obtain ct head   AKI (acute kidney injury) ([place])- resolving returning to baseline with ivf already, ua pending   Dementia- noted   Elevated lipase - likely due to volume depletion, less than 400  Clarify code status with SNF   DVT prophylaxis:  scds Code Status:  full Family Communication:  none Disposition Plan:  Per day team Consults called:  none Admission status:  Admission to stepdown   Mozell Hardacre A MD [facility]  If 7PM-7AM, please contact night-coverage [email or web address] Password [facility]  04/10/2017, 5:24 PM

## 2017-04-10 NOTE — ED Triage Notes (Signed)
Per EMS: Pt reports to the ED vis [facility] from [facility]. Pt reports generalized weakness and hyperglycemia. Pt reports to taking his metformin as prescribed.  Pt's CBG was 563  Pt has hx of dementia and is alert per normal to person only.  156/90  Pulse 90 R 16 O2 97% on RA

## 2017-04-10 NOTE — Progress Notes (Addendum)
Pharmacy Antibiotic Note  Manuel Randolph. is a 72 y.o. male admitted on 04/10/2017 with pneumonia.  Pharmacy has been consulted for vancomycin and cefepime dosing.  Today, 04/10/2017  Renal: CKD - appears SCr may be at baseline  CBC slightly elevated  Afebrile, + hypothermia  Plan:  Vancomycin 1gm x 1 then 750mg  IV q24h  Cefepime 2gm x 1 then 1gm IV q12h  Check daily SCr with underlying renal insufficiency  Check vancomycin levels if remains on vancomycin > 4 days  Recommend checking MRSA PCR  Await cultures and ability to narrow antibiotics  Weight: 131 lb (59.4 kg)  Temp (24hrs), Avg:96.6 F (35.9 C), Min:94.6 F (34.8 C), Max:98.5 F (36.9 C)  Recent Labs  Lab 04/10/17 1220 04/10/17 1230 04/10/17 1231 04/10/17 1232 04/10/17 1500  WBC 9.7  --   --   --   --   CREATININE  --  1.92*  --  1.80* 1.40*  LATICACIDVEN  --   --  1.27  --   --     Estimated Creatinine Clearance: 41.3 mL/min (A) (by C-G formula based on SCr of 1.4 mg/dL (H)).    No Known Allergies  Antimicrobials this admission: 1/10 vanco >> 1/10 cefepime >>  Dose adjustments this admission:  Microbiology results: 1/10 BCx:  1/10 strep Ag:  1/10 flu panel 1/10 resp panel:  Sputum: ordered   Thank you for allowing pharmacy to be a part of this patient's care.  Doreene Eland, PharmD, BCPS.   Pager: [phone] 04/10/2017 6:34 PM

## 2017-04-10 NOTE — ED Notes (Signed)
Family contacts [name] [phone] primary  Randall Hiss [phone]

## 2017-04-10 NOTE — ED Provider Notes (Signed)
Patient seen and evaluated. Discussed with PA, Shary Decamp PA-c.  Patient with hyperglycemia, colitis weakness. Dementia. Able to provide historical details for himself.  Hypothermic on arrival, improves with blankets and bear hugger.  Moans in response with some simple wording. Not appropriate answers, consistent with dementia  Not tachycardic or hypoxemic.  Chest x-ray shows suggestion of atelectasis versus lower lobe infiltrate. Patient undergoing CT of abdomen. Lower lung field cut should help specify infiltrate versus atelectasis. Patient not febrile or hypoxemic, normal lactate, doubt pneumonia  Elevation of lactate. No obvious etiology. Obtaining lipid profile. CT scan.  Hyperglycemia. Getting subcutaneous insulin and fluids.  Hyperkalemia. Does have some suggestion of the T waves. No QRS widening. Plan fluids, insulin, single dose calcium gluconate. Repeat EKG and basic metabolic profile after treatment.  Patient will require admission after the above.   Tanna Furry, MD 04/10/17 1434

## 2017-04-11 DIAGNOSIS — J189 Pneumonia, unspecified organism: Secondary | ICD-10-CM

## 2017-04-11 DIAGNOSIS — K859 Acute pancreatitis without necrosis or infection, unspecified: Secondary | ICD-10-CM

## 2017-04-11 DIAGNOSIS — I10 Essential (primary) hypertension: Secondary | ICD-10-CM

## 2017-04-11 DIAGNOSIS — I358 Other nonrheumatic aortic valve disorders: Secondary | ICD-10-CM

## 2017-04-11 DIAGNOSIS — D510 Vitamin B12 deficiency anemia due to intrinsic factor deficiency: Secondary | ICD-10-CM

## 2017-04-11 DIAGNOSIS — N183 Chronic kidney disease, stage 3 (moderate): Secondary | ICD-10-CM

## 2017-04-11 DIAGNOSIS — N179 Acute kidney failure, unspecified: Secondary | ICD-10-CM

## 2017-04-11 DIAGNOSIS — G9341 Metabolic encephalopathy: Secondary | ICD-10-CM

## 2017-04-11 DIAGNOSIS — T68XXXD Hypothermia, subsequent encounter: Secondary | ICD-10-CM

## 2017-04-11 DIAGNOSIS — E1122 Type 2 diabetes mellitus with diabetic chronic kidney disease: Secondary | ICD-10-CM

## 2017-04-11 DIAGNOSIS — A419 Sepsis, unspecified organism: Principal | ICD-10-CM

## 2017-04-11 LAB — RESPIRATORY PANEL BY PCR
Adenovirus: NOT DETECTED
BORDETELLA PERTUSSIS-RVPCR: NOT DETECTED
CHLAMYDOPHILA PNEUMONIAE-RVPPCR: NOT DETECTED
CORONAVIRUS HKU1-RVPPCR: NOT DETECTED
Coronavirus 229E: NOT DETECTED
Coronavirus NL63: NOT DETECTED
Coronavirus OC43: NOT DETECTED
INFLUENZA A-RVPPCR: NOT DETECTED
Influenza B: NOT DETECTED
METAPNEUMOVIRUS-RVPPCR: NOT DETECTED
Mycoplasma pneumoniae: NOT DETECTED
PARAINFLUENZA VIRUS 2-RVPPCR: NOT DETECTED
PARAINFLUENZA VIRUS 3-RVPPCR: NOT DETECTED
PARAINFLUENZA VIRUS 4-RVPPCR: NOT DETECTED
Parainfluenza Virus 1: NOT DETECTED
RHINOVIRUS / ENTEROVIRUS - RVPPCR: NOT DETECTED
Respiratory Syncytial Virus: NOT DETECTED

## 2017-04-11 LAB — CBC
HCT: 30.7 % — ABNORMAL LOW (ref 39.0–52.0)
Hemoglobin: 9.9 g/dL — ABNORMAL LOW (ref 13.0–17.0)
MCH: 29.9 pg (ref 26.0–34.0)
MCHC: 32.2 g/dL (ref 30.0–36.0)
MCV: 92.7 fL (ref 78.0–100.0)
PLATELETS: 166 10*3/uL (ref 150–400)
RBC: 3.31 MIL/uL — ABNORMAL LOW (ref 4.22–5.81)
RDW: 13.4 % (ref 11.5–15.5)
WBC: 9.4 10*3/uL (ref 4.0–10.5)

## 2017-04-11 LAB — COMPREHENSIVE METABOLIC PANEL
ALBUMIN: 3.8 g/dL (ref 3.5–5.0)
ALK PHOS: 109 U/L (ref 38–126)
ALT: 14 U/L — ABNORMAL LOW (ref 17–63)
ANION GAP: 5 (ref 5–15)
AST: 18 U/L (ref 15–41)
BILIRUBIN TOTAL: 0.6 mg/dL (ref 0.3–1.2)
BUN: 29 mg/dL — ABNORMAL HIGH (ref 6–20)
CALCIUM: 9.8 mg/dL (ref 8.9–10.3)
CO2: 26 mmol/L (ref 22–32)
Chloride: 110 mmol/L (ref 101–111)
Creatinine, Ser: 1.36 mg/dL — ABNORMAL HIGH (ref 0.61–1.24)
GFR calc non Af Amer: 51 mL/min — ABNORMAL LOW (ref >60)
GFR, EST AFRICAN AMERICAN: 59 mL/min — AB (ref >60)
GLUCOSE: 102 mg/dL — AB (ref 65–99)
POTASSIUM: 4.9 mmol/L (ref 3.5–5.1)
SODIUM: 141 mmol/L (ref 135–145)
TOTAL PROTEIN: 7.3 g/dL (ref 6.5–8.1)

## 2017-04-11 LAB — CREATININE, SERUM
CREATININE: 1.44 mg/dL — AB (ref 0.61–1.24)
GFR calc Af Amer: 55 mL/min — ABNORMAL LOW (ref >60)
GFR, EST NON AFRICAN AMERICAN: 48 mL/min — AB (ref >60)

## 2017-04-11 LAB — CBC WITH DIFFERENTIAL/PLATELET
Basophils Absolute: 0.1 K/uL (ref 0.0–0.1)
Basophils Relative: 1 %
Eosinophils Absolute: 0.4 K/uL (ref 0.0–0.7)
Eosinophils Relative: 4 %
HCT: 34.3 % — ABNORMAL LOW (ref 39.0–52.0)
Hemoglobin: 10.9 g/dL — ABNORMAL LOW (ref 13.0–17.0)
Lymphocytes Relative: 32 %
Lymphs Abs: 2.6 K/uL (ref 0.7–4.0)
MCH: 29.6 pg (ref 26.0–34.0)
MCHC: 31.8 g/dL (ref 30.0–36.0)
MCV: 93.2 fL (ref 78.0–100.0)
Monocytes Absolute: 0.4 K/uL (ref 0.1–1.0)
Monocytes Relative: 6 %
Neutro Abs: 4.6 K/uL (ref 1.7–7.7)
Neutrophils Relative %: 57 %
Platelets: 191 K/uL (ref 150–400)
RBC: 3.68 MIL/uL — ABNORMAL LOW (ref 4.22–5.81)
RDW: 13.5 % (ref 11.5–15.5)
WBC: 8.1 K/uL (ref 4.0–10.5)

## 2017-04-11 LAB — MRSA PCR SCREENING: MRSA by PCR: NEGATIVE

## 2017-04-11 LAB — CBG MONITORING, ED
GLUCOSE-CAPILLARY: 93 mg/dL (ref 65–99)
GLUCOSE-CAPILLARY: 122 mg/dL — AB (ref 65–99)
GLUCOSE-CAPILLARY: 150 mg/dL — AB (ref 65–99)

## 2017-04-11 LAB — LACTIC ACID, PLASMA
Lactic Acid, Venous: 1.5 mmol/L (ref 0.5–1.9)
Lactic Acid, Venous: 2.1 mmol/L (ref 0.5–1.9)

## 2017-04-11 LAB — STREP PNEUMONIAE URINARY ANTIGEN: STREP PNEUMO URINARY ANTIGEN: NEGATIVE

## 2017-04-11 LAB — GLUCOSE, CAPILLARY
GLUCOSE-CAPILLARY: 306 mg/dL — AB (ref 65–99)
GLUCOSE-CAPILLARY: 235 mg/dL — AB (ref 65–99)

## 2017-04-11 LAB — INFLUENZA PANEL BY PCR (TYPE A & B)
INFLBPCR: NEGATIVE
Influenza A By PCR: NEGATIVE

## 2017-04-11 LAB — TSH: TSH: 1.556 u[IU]/mL (ref 0.350–4.500)

## 2017-04-11 MED ORDER — AMLODIPINE BESYLATE 5 MG PO TABS
5.0000 mg | ORAL_TABLET | Freq: Once | ORAL | Status: AC
  Administered 2017-04-11: 5 mg via ORAL
  Filled 2017-04-11 (×2): qty 1

## 2017-04-11 MED ORDER — IPRATROPIUM-ALBUTEROL 0.5-2.5 (3) MG/3ML IN SOLN: 3.0000 mL | RESPIRATORY_TRACT | Status: DC | PRN

## 2017-04-11 MED ORDER — METHYLPREDNISOLONE SODIUM SUCC 125 MG IJ SOLR
60.0000 mg | Freq: Once | INTRAMUSCULAR | Status: AC
  Administered 2017-04-11: 60 mg via INTRAVENOUS
  Filled 2017-04-11: qty 2

## 2017-04-11 MED ORDER — IPRATROPIUM-ALBUTEROL 0.5-2.5 (3) MG/3ML IN SOLN
3.0000 mL | Freq: Four times a day (QID) | RESPIRATORY_TRACT | Status: DC
  Administered 2017-04-11: 3 mL via RESPIRATORY_TRACT
  Filled 2017-04-11 (×2): qty 3

## 2017-04-11 MED ORDER — ENOXAPARIN SODIUM 40 MG/0.4ML ~~LOC~~ SOLN
40.0000 mg | SUBCUTANEOUS | Status: DC
  Administered 2017-04-11 – 2017-04-14 (×4): 40 mg via SUBCUTANEOUS
  Filled 2017-04-11 (×4): qty 0.4

## 2017-04-11 MED ORDER — HYDRALAZINE HCL 25 MG PO TABS
75.0000 mg | ORAL_TABLET | Freq: Three times a day (TID) | ORAL | Status: DC
Start: 2017-04-11 — End: 2017-04-15
  Administered 2017-04-11 – 2017-04-15 (×12): 75 mg via ORAL
  Filled 2017-04-11 (×14): qty 1

## 2017-04-11 MED ORDER — IPRATROPIUM-ALBUTEROL 0.5-2.5 (3) MG/3ML IN SOLN
3.0000 mL | Freq: Three times a day (TID) | RESPIRATORY_TRACT | Status: DC
  Administered 2017-04-12: 3 mL via RESPIRATORY_TRACT
  Filled 2017-04-11 (×3): qty 3

## 2017-04-11 MED ORDER — GUAIFENESIN ER 600 MG PO TB12
1200.0000 mg | ORAL_TABLET | Freq: Two times a day (BID) | ORAL | Status: DC
  Administered 2017-04-11 – 2017-04-15 (×7): 1200 mg via ORAL
  Filled 2017-04-11 (×6): qty 2

## 2017-04-11 MED ORDER — SODIUM CHLORIDE 0.9 % IV BOLUS (SEPSIS)
500.0000 mL | Freq: Once | INTRAVENOUS | Status: AC
  Administered 2017-04-11: 500 mL via INTRAVENOUS

## 2017-04-11 MED ORDER — AMLODIPINE BESYLATE 10 MG PO TABS
10.0000 mg | ORAL_TABLET | Freq: Every day | ORAL | Status: DC
  Administered 2017-04-12: 10 mg via ORAL
  Filled 2017-04-11: qty 1

## 2017-04-11 MED ORDER — BUDESONIDE 0.5 MG/2ML IN SUSP
0.5000 mg | Freq: Two times a day (BID) | RESPIRATORY_TRACT | Status: DC
  Administered 2017-04-11 – 2017-04-15 (×6): 0.5 mg via RESPIRATORY_TRACT
  Filled 2017-04-11 (×9): qty 2

## 2017-04-11 NOTE — ED Notes (Signed)
Bed: RJ73 Expected date:  Expected time:  Means of arrival:  Comments: Room 7

## 2017-04-11 NOTE — ED Notes (Signed)
Date and time results received: 04/11/17 0058  (use smartphrase ".now" to insert current time)  Test: Lactic acid  Critical Value: 2.1  Name of Provider Notified: Francee Nodal

## 2017-04-11 NOTE — ED Notes (Signed)
Attempted to collect sputum for culture and gram stain, pt refused to sit up and drink or cough. Pt sleeping and not wanting to be messed with.

## 2017-04-11 NOTE — Progress Notes (Signed)
PROGRESS NOTE    Manuel Randolph  RDE:[id number] DOB: 1946/01/27 DOA: 04/10/2017 PCP: [name], MD    Brief Narrative:  Manuel Greenhouse Sr. is a 72 y.o. male with medical history significant of dementia, dm, htn, [name] sent in from SNF for lethargy and high glucose level.  Pt cannot provide any history.  Per ED report, pt was found to have hyperkalemia with ekg changes which have both improved with ivf and treatment in the ED along with resolving aki.  Pt temp 93 F and with barehugger on.  Reported initially that cxr and ua were normal.  Pt was referred for admission for tele monitoring to ensure no further ekg changes and hyperkalemia remained resolved.      Assessment & Plan:   Principal Problem:   Sepsis ([place]) Active Problems:   Aortic valve sclerosis   Chronic lymphocytic leukemia ([place])   Essential (primary) hypertension   Peripheral vascular disease (HCC)   Type 2 diabetes mellitus with stage 3 chronic kidney disease (HCC)   Anemia, pernicious   Multiple lacunar infarcts   Hypothermia   AKI (acute kidney injury) ([place])   Hyperkalemia   Acute metabolic encephalopathy   Dementia   HCAP (healthcare-associated pneumonia)  #1 probable sepsis Patient was admitted with concerns for sepsis as patient was noted to be hypothermic, chest x-ray abnormalities concerning for probable pneumonia and patient noted to have systolic blood pressures in the 90s, elevated lactic acid level, elevated creatinine/acute renal failure.  Patient currently off bear hugger and hypothermia seems to have improved.  TSH within normal limits.  CT head negative for any acute abnormalities.  The abdomen and pelvis with no acute abnormalities.  Lactic acid level is trending down. Procalcitonin was less than 0.10.  Respiratory viral panel PCR negative.  Blood cultures pending.  Sputum Gram stain and culture pending.  Check a MRSA PCR.  Continue empiric IV vancomycin and IV cefepime.  Supportive care.   Follow.  2.  Acute renal failure Likely secondary to prerenal azotemia.  Improving with hydration.  Follow.  3.  Hyperkalemia Resolved.  4.  Probable healthcare associated pneumonia Per chest x-ray.  Patient noted to be hypothermic.  Sputum Gram stain and cultures pending.  Blood cultures pending.  Respiratory viral panel by PCR is negative.  MRSA PCR.  Continue empiric IV vancomycin and IV cefepime.  Placed on Pulmicort, Mucinex, duo nebs.  Follow.  5.  Hypertension Patient noted to have a significantly elevated blood pressure.  Increase Norvasc to 10 mg daily.  Increase hydralazine to 75 mg every 8 hours.  Follow.  6.  Dementia Stable.  7.  Diabetes mellitus Hemoglobin A1c.  Continue sliding scale insulin.  8.  Murmur Check a 2D echo.    DVT prophylaxis: Lovenox Code Status: Full Family Communication: Updated son at bedside. Disposition Plan: Likely skilled nursing facility once medically stable.   Consultants:   None  Procedures:   CT head without contrast 04/10/2017  CT abdomen and pelvis 04/10/2017  CXR 04/10/2017  Antimicrobials:   Vancomycin 04/11/2017  IV cefepime 04/11/2017   Subjective: Off Bair hugger.  Patient states he is feeling some better than on admission.  Patient with some shortness of breath.  Denies any chest pain.  Objective: Vitals:   04/11/17 1047 04/11/17 1210 04/11/17 1215 04/11/17 1236  BP: (!) 168/70 (!) 186/68 (!) 186/73 (!) 186/73  Pulse: 65 61    Resp: 10 10 15    Temp:  TempSrc:      SpO2: 99% 99%    Weight:        Intake/Output Summary (Last 24 hours) at 04/11/2017 1238 Last data filed at 04/11/2017 0314 Gross per 24 hour  Intake 750 ml  Output 2600 ml  Net -1850 ml   Filed Weights   04/10/17 1145  Weight: 59.4 kg (131 lb)    Examination:  General exam: Appears calm and comfortable  Respiratory system: Some diffuse coarse breath sounds.  No crackles. Respiratory effort normal. Cardiovascular system: Regular  rate and rhythm with a 3/6 systolic ejection murmur. No JVD, murmurs, rubs, gallops or clicks. No pedal edema. Gastrointestinal system: Abdomen is nondistended, soft and nontender. No organomegaly or masses felt. Normal bowel sounds heard. Central nervous system: Alert and oriented. No focal neurological deficits. Extremities: Symmetric 5 x 5 power. Skin: No rashes, lesions or ulcers Psychiatry: Judgement and insight appear normal. Mood & affect appropriate.     Data Reviewed: I have personally reviewed following labs and imaging studies  CBC: Recent Labs  Lab 04/10/17 1220 04/10/17 1232 04/10/17 1500 04/10/17 2359 04/11/17 0533  WBC 9.7  --   --  9.4 8.1  NEUTROABS  --   --   --   --  4.6  HGB 11.0* 12.2* 10.5* 9.9* 10.9*  HCT 34.8* 36.0* 31.0* 30.7* 34.3*  MCV 93.0  --   --  92.7 93.2  PLT 159  --   --  166 443   Basic Metabolic Panel: Recent Labs  Lab 04/10/17 1230 04/10/17 1232 04/10/17 1500 04/10/17 2359 04/11/17 0533  NA 133* 136 144  --  141  K 6.3* 5.6* 3.6  --  4.9  CL 100* 102 111  --  110  CO2 25  --   --   --  26  GLUCOSE 448* 466* 267*  --  102*  BUN 49* 44* 35*  --  29*  CREATININE 1.92* 1.80* 1.40* 1.44* 1.36*  CALCIUM 10.2  --   --   --  9.8   GFR: Estimated Creatinine Clearance: 42.5 mL/min (A) (by C-G formula based on SCr of 1.36 mg/dL (H)). Liver Function Tests: Recent Labs  Lab 04/10/17 1230 04/11/17 0533  AST 28 18  ALT 12* 14*  ALKPHOS 111 109  BILITOT 1.1 0.6  PROT 8.1 7.3  ALBUMIN 4.2 3.8   Recent Labs  Lab 04/10/17 1230  LIPASE 312*   No results for input(s): AMMONIA in the last 168 hours. Coagulation Profile: No results for input(s): INR, PROTIME in the last 168 hours. Cardiac Enzymes: No results for input(s): CKTOTAL, CKMB, CKMBINDEX, TROPONINI in the last 168 hours. BNP (last 3 results) No results for input(s): PROBNP in the last 8760 hours. HbA1C: No results for input(s): HGBA1C in the last 72 hours. CBG: Recent Labs   Lab 04/10/17 1128 04/10/17 2353 04/11/17 0517 04/11/17 0742 04/11/17 1204  GLUCAP 452* 288* 93 122* 150*   Lipid Profile: Recent Labs    04/10/17 1230  CHOL 148  HDL 45  LDLCALC 91  TRIG 61  CHOLHDL 3.3   Thyroid Function Tests: No results for input(s): TSH, T4TOTAL, FREET4, T3FREE, THYROIDAB in the last 72 hours. Anemia Panel: No results for input(s): VITAMINB12, FOLATE, FERRITIN, TIBC, IRON, RETICCTPCT in the last 72 hours. Sepsis Labs: Recent Labs  Lab 04/10/17 1231 04/10/17 1759 04/10/17 2359 04/11/17 0242  PROCALCITON  --  <0.10  --   --   LATICACIDVEN 1.27  --  2.1* 1.5  Recent Results (from the past 240 hour(s))  Respiratory Panel by PCR     Status: None   Collection Time: 04/10/17 11:59 PM  Result Value Ref Range Status   Adenovirus NOT DETECTED NOT DETECTED Final   Coronavirus 229E NOT DETECTED NOT DETECTED Final   Coronavirus HKU1 NOT DETECTED NOT DETECTED Final   Coronavirus NL63 NOT DETECTED NOT DETECTED Final   Coronavirus OC43 NOT DETECTED NOT DETECTED Final   Metapneumovirus NOT DETECTED NOT DETECTED Final   Rhinovirus / Enterovirus NOT DETECTED NOT DETECTED Final   Influenza A NOT DETECTED NOT DETECTED Final   Influenza B NOT DETECTED NOT DETECTED Final   Parainfluenza Virus 1 NOT DETECTED NOT DETECTED Final   Parainfluenza Virus 2 NOT DETECTED NOT DETECTED Final   Parainfluenza Virus 3 NOT DETECTED NOT DETECTED Final   Parainfluenza Virus 4 NOT DETECTED NOT DETECTED Final   Respiratory Syncytial Virus NOT DETECTED NOT DETECTED Final   Bordetella pertussis NOT DETECTED NOT DETECTED Final   Chlamydophila pneumoniae NOT DETECTED NOT DETECTED Final   Mycoplasma pneumoniae NOT DETECTED NOT DETECTED Final         Radiology Studies: Ct Abdomen Pelvis Wo Contrast  Result Date: 04/10/2017 CLINICAL DATA:  72 year old male with history of generalized weakness and hyperglycemia. EXAM: CT ABDOMEN AND PELVIS WITHOUT CONTRAST TECHNIQUE:  Multidetector CT imaging of the abdomen and pelvis was performed following the standard protocol without IV contrast. COMPARISON:  No priors. FINDINGS: Lower chest: Extensive areas of septal thickening, thickening of the peribronchovascular interstitium, regional areas of architectural distortion and patchy areas of cylindrical bronchiectasis are noted throughout the lung bases bilaterally. A few scattered pulmonary nodules are noted in the lung bases measuring up to 6 mm in the left lower lobe and 7 mm in the periphery of the right middle lobe. Calcifications of the aortic valve and mitral annulus. Aortic atherosclerosis. Atherosclerotic calcifications in the left anterior descending and left circumflex coronary arteries. Hepatobiliary: No definite cystic or solid hepatic lesions are confidently identified on today's noncontrast CT examination. Numerous tiny calcified gallstones lie dependently in the gallbladder. No findings to suggest an associated cholecystitis. Pancreas: No definite pancreatic mass or peripancreatic inflammatory changes are noted on today's noncontrast CT examination. Spleen: Unremarkable. Adrenals/Urinary Tract: Multiple calcifications in the kidneys bilaterally appear to be vascular. 12 mm exophytic intermediate attenuation (41 HU) lesion extending off the posterior aspect of the lower pole of the left kidney, incompletely characterized on today's noncontrast CT examination, but statistically likely a small proteinaceous cyst. No hydroureteronephrosis. Urinary bladder is normal in appearance. Bilateral adrenal glands are normal in appearance. Stomach/Bowel: Normal appearance of the stomach. No pathologic dilatation of small bowel or colon. Normal appendix. Vascular/Lymphatic: Aortic atherosclerosis, without definite aneurysm in the abdominal or pelvic vasculature. No lymphadenopathy noted in the abdomen or pelvis. Reproductive: Prostate gland and seminal vesicles are unremarkable in  appearance. Postoperative changes of penile implant noted, with reservoir in the lower right hemipelvis. Other: No significant volume of ascites.  No pneumoperitoneum. Musculoskeletal: There are no aggressive appearing lytic or blastic lesions noted in the visualized portions of the skeleton. Chronic appearing compression fracture of T12 with approximately 40% loss of anterior vertebral body height. IMPRESSION: 1. No acute findings noted in the abdomen or pelvis to account for the patient's symptoms. 2. Cholelithiasis without evidence of acute cholecystitis at this time. 3. Aortic atherosclerosis, in addition to at least 2 vessel coronary artery disease. Assessment for potential risk factor modification, dietary therapy or pharmacologic therapy may  be warranted, if clinically indicated. 4. There are calcifications of the aortic valve and mitral annulus. Echocardiographic correlation for evaluation of potential valvular dysfunction may be warranted if clinically indicated. 5. Additional incidental findings, as above. Aortic Atherosclerosis (ICD10-I70.0). Electronically Signed   By: Vinnie Langton M.D.   On: 04/10/2017 14:52   Dg Chest 2 View  Result Date: 04/10/2017 CLINICAL DATA:  Hypothermia EXAM: CHEST  2 VIEW COMPARISON:  11/13/2016 chest radiograph. FINDINGS: Slightly low lung volumes. Stable cardiomediastinal silhouette with normal heart size. No pneumothorax. No pleural effusion. No pulmonary edema. Hazy and curvilinear bibasilar lung opacities. IMPRESSION: Hazy and curvilinear bibasilar lung opacities with slightly low lung volumes. Favor atelectasis, difficult to exclude aspiration or developing pneumonia. Short-term follow-up PA and lateral chest radiographs advised. Electronically Signed   By: Ilona Sorrel M.D.   On: 04/10/2017 12:47   Ct Head Wo Contrast  Result Date: 04/10/2017 CLINICAL DATA:  Altered mental status with generalized weakness and hyperglycemia. EXAM: CT HEAD WITHOUT CONTRAST  TECHNIQUE: Contiguous axial images were obtained from the base of the skull through the vertex without intravenous contrast. COMPARISON:  11/13/2016 FINDINGS: Brain: Examination demonstrates minimal age related atrophic change which is stable. Ventricles and cisterns are otherwise unchanged. There is chronic ischemic microvascular disease present. Old lacunar infarct over the left lentiform nucleus. Small old infarct in the right periventricular white matter and right basal ganglia. No evidence of focal mass, mass effect, shift of midline structures or acute hemorrhage. Vascular: No hyperdense vessel or unexpected calcification. Skull: Normal. Negative for fracture or focal lesion. Sinuses/Orbits: Orbits are within normal. Paranasal sinuses are well developed as there is mild mucosal membrane thickening/opacification of on the ethmoid sinus and right maxillary sinus with small air-fluid level over the left maxillary sinus. Mastoid air cells are clear. Other: Prominence of the parotid glands right worse than left which are partially visualized. No definite mass or adenopathy. IMPRESSION: No acute findings. Mild atrophy with chronic ischemic microvascular disease. Several small old central infarcts as described. Sinus inflammatory change as described. Electronically Signed   By: Marin Olp M.D.   On: 04/10/2017 17:33        Scheduled Meds: . amLODipine  5 mg Oral Daily  . enoxaparin (LOVENOX) injection  40 mg Subcutaneous Q24H  . hydrALAZINE  50 mg Oral Q8H  . insulin aspart  0-9 Units Subcutaneous Q4H   Continuous Infusions: . sodium chloride 125 mL/hr at 04/11/17 0046  . ceFEPime (MAXIPIME) IV    . vancomycin       LOS: 1 day    Time spent: 35 mins    Irine Seal, MD [facility] Pager [phone]  If 7PM-7AM, please contact night-coverage [email or web address] Password [facility] 04/11/2017, 12:38 PM

## 2017-04-12 ENCOUNTER — Inpatient Hospital Stay (HOSPITAL_COMMUNITY): Payer: Medicare Other

## 2017-04-12 DIAGNOSIS — I350 Nonrheumatic aortic (valve) stenosis: Secondary | ICD-10-CM

## 2017-04-12 LAB — ECHOCARDIOGRAM COMPLETE
AO mean calculated velocity dopler: 164 cm/s
AOVTI: 56.6 cm
AV Area VTI index: 0.72 cm2/m2
AV Area VTI: 1.24 cm2
AV Area mean vel: 1.3 cm2
AV VEL mean LVOT/AV: 0.57
AV area mean vel ind: 0.65 cm2/m2
AV vel: 1.45
AVA: 1.45 cm2
AVG: 14 mmHg
AVPG: 30 mmHg
AVPKVEL: 274 cm/s
Ao pk vel: 0.55 m/s
CHL CUP AV PEAK INDEX: 0.62
CHL CUP DOP CALC LVOT VTI: 36.2 cm
CHL CUP MV DEC (S): 387
E decel time: 387 msec
FS: 31 % (ref 28–44)
Height: 68 in
IV/PV OW: 0.79
LA vol index: 31.7 mL/m2
LA vol: 63.6 mL
LADIAMINDEX: 1.79 cm/m2
LASIZE: 36 mm
LAVOLA4C: 52.6 mL
LDCA: 2.27 cm2
LEFT ATRIUM END SYS DIAM: 36 mm
LV PW d: 12.7 mm — AB (ref 0.6–1.1)
LVOT diameter: 17 mm
LVOT peak VTI: 0.64 cm
LVOT peak grad rest: 9 mmHg
LVOT peak vel: 150 cm/s
LVOTSV: 82 mL
MV Peak grad: 3 mmHg
MV pk E vel: 86.6 m/s
MVPKAVEL: 165 m/s
RV TAPSE: 20.7 mm
Valve area index: 0.72
WEIGHTICAEL: 2912 [oz_av]

## 2017-04-12 LAB — GLUCOSE, CAPILLARY
GLUCOSE-CAPILLARY: 158 mg/dL — AB (ref 65–99)
GLUCOSE-CAPILLARY: 131 mg/dL — AB (ref 65–99)
GLUCOSE-CAPILLARY: 142 mg/dL — AB (ref 65–99)
GLUCOSE-CAPILLARY: 150 mg/dL — AB (ref 65–99)
Glucose-Capillary: 257 mg/dL — ABNORMAL HIGH (ref 65–99)

## 2017-04-12 LAB — CBC WITH DIFFERENTIAL/PLATELET
BASOS ABS: 0 10*3/uL (ref 0.0–0.1)
BASOS PCT: 0 %
Eosinophils Absolute: 0 10*3/uL (ref 0.0–0.7)
Eosinophils Relative: 0 %
HEMATOCRIT: 31.1 % — AB (ref 39.0–52.0)
HEMOGLOBIN: 9.9 g/dL — AB (ref 13.0–17.0)
Lymphocytes Relative: 22 %
Lymphs Abs: 2 10*3/uL (ref 0.7–4.0)
MCH: 29.4 pg (ref 26.0–34.0)
MCHC: 31.8 g/dL (ref 30.0–36.0)
MCV: 92.3 fL (ref 78.0–100.0)
MONOS PCT: 4 %
Monocytes Absolute: 0.3 10*3/uL (ref 0.1–1.0)
NEUTROS ABS: 6.9 10*3/uL (ref 1.7–7.7)
NEUTROS PCT: 74 %
Platelets: 184 10*3/uL (ref 150–400)
RBC: 3.37 MIL/uL — ABNORMAL LOW (ref 4.22–5.81)
RDW: 13.4 % (ref 11.5–15.5)
WBC: 9.2 10*3/uL (ref 4.0–10.5)

## 2017-04-12 LAB — BASIC METABOLIC PANEL
ANION GAP: 7 (ref 5–15)
BUN: 26 mg/dL — ABNORMAL HIGH (ref 6–20)
CHLORIDE: 108 mmol/L (ref 101–111)
CO2: 22 mmol/L (ref 22–32)
CREATININE: 1.49 mg/dL — AB (ref 0.61–1.24)
Calcium: 9.2 mg/dL (ref 8.9–10.3)
GFR calc non Af Amer: 46 mL/min — ABNORMAL LOW (ref >60)
GFR, EST AFRICAN AMERICAN: 53 mL/min — AB (ref >60)
Glucose, Bld: 186 mg/dL — ABNORMAL HIGH (ref 65–99)
Potassium: 4.7 mmol/L (ref 3.5–5.1)
Sodium: 137 mmol/L (ref 135–145)

## 2017-04-12 MED ORDER — IPRATROPIUM-ALBUTEROL 0.5-2.5 (3) MG/3ML IN SOLN
3.0000 mL | Freq: Two times a day (BID) | RESPIRATORY_TRACT | Status: DC
  Administered 2017-04-13 – 2017-04-15 (×4): 3 mL via RESPIRATORY_TRACT
  Filled 2017-04-12 (×5): qty 3

## 2017-04-12 MED ORDER — QUETIAPINE FUMARATE 25 MG PO TABS
25.0000 mg | ORAL_TABLET | Freq: Every day | ORAL | Status: DC
  Administered 2017-04-12 – 2017-04-14 (×3): 25 mg via ORAL
  Filled 2017-04-12 (×3): qty 1

## 2017-04-12 NOTE — Evaluation (Signed)
Clinical/Bedside Swallow Evaluation Patient Details  Name: Manuel MAHANEY Sr. MRN: [id number] Date of Birth: 06/08/1945  Today's Date: 04/12/2017 Time: SLP Start Time (ACUTE ONLY): 1334 SLP Stop Time (ACUTE ONLY): 1355 SLP Time Calculation (min) (ACUTE ONLY): 21 min  Past Medical History:  Past Medical History:  Diagnosis Date  . Anemia   . Diabetes ([place])   . GERD (gastroesophageal reflux disease)   . Hyperlipidemia   . Hypertension   . Leukemia [facility])    Past Surgical History:  Past Surgical History:  Procedure Laterality Date  . CATARACT EXTRACTION W/PHACO Left 09/13/2014   Procedure: CATARACT EXTRACTION PHACO AND INTRAOCULAR LENS PLACEMENT (IOC);  Surgeon: Birder Robson, MD;  Location: [facility] ORS;  Service: Ophthalmology;  Laterality: Left;  [place] 01:03   . COLONOSCOPY  2014  . POPLITEAL ARTERY ANGIOPLASTY Left 2014  . PTCA Right    leg  . UPPER GASTROINTESTINAL ENDOSCOPY  2014   HPI:  Manuel [name] a 72 y.o.malewith medical history significant ofdementia, dm, htn, [name] sent in from SNF for lethargy and high glucose level. Dx with sepsis. CXR concerning for left sided PNA. MD noted patient coughing with pos this am raising concern for possible aspiration PNA. Evaluated by SLP at bedside during 10/2016 admission and patient with oral dysphagia but recommendations for regular solids, thin liquid.    Assessment / Plan / Recommendation Clinical Impression  Patient presents with a mild oropharyngeal dysphagia characterized by functioinal but prolonged mastication of regular texture solids, likely due to advancing dementia. In general, patient with seemingly good airway protection across liquids and solids during intial portion of evaluation, with no s/s of aspiration, clear vocal quality throughout. When allowed to self feed however, minimal coughing noted, likely due to rapid rate of intake, suspect peicemeal mastication of bolus with probable delays in swallow initiation.  Patient consistently able to maintain clear vocal quality however post cough, hopeful clearance of airway/laryngeal vestibule. Recommend dysphagia 3, thin liquids with close f/u for supervision with po intake to control rate of intake and bolus size. SLP will f/u for tolerance and need for instrumental testing.  SLP Visit Diagnosis: Dysphagia, oropharyngeal phase (R13.12)    Aspiration Risk  Moderate aspiration risk    Diet Recommendation Dysphagia 3 (Mech soft);Thin liquid   Liquid Administration via: Cup;Straw Medication Administration: Crushed with puree Supervision: Patient able to self feed;Full supervision/cueing for compensatory strategies Compensations: Slow rate;Small sips/bites Postural Changes: Seated upright at 90 degrees    Other  Recommendations Oral Care Recommendations: Oral care BID   Follow up Recommendations (TBD)      Frequency and Duration min 2x/week  1 week       Prognosis Barriers to Reach Goals: Cognitive deficits      Swallow Study   General HPI: Manuel [name] a 72 y.o.malewith medical history significant ofdementia, dm, htn, [name] sent in from SNF for lethargy and high glucose level. Dx with sepsis. CXR concerning for left sided PNA. MD noted patient coughing with pos this am raising concern for possible aspiration PNA. Evaluated by SLP at bedside during 10/2016 admission and patient with oral dysphagia but recommendations for regular solids, thin liquid.  Type of Study: Bedside Swallow Evaluation Previous Swallow Assessment: see HPI Diet Prior to this Study: Thin liquids;Dysphagia 3 (soft) Temperature Spikes Noted: No Respiratory Status: Room air History of Recent Intubation: No Behavior/Cognition: Alert;Cooperative;Pleasant mood;Confused Oral Cavity Assessment: Within Functional Limits Oral Care Completed by SLP: Recent completion by staff Vision: Functional for  self-feeding Self-Feeding Abilities: Able to feed self Patient Positioning:  Upright in bed Baseline Vocal Quality: Normal Volitional Cough: Cognitively unable to elicit Volitional Swallow: Unable to elicit    Oral/Motor/Sensory Function Overall Oral Motor/Sensory Function: Within functional limits(although unable to formally assess due to cognitive status)   Ice Chips Ice chips: Not tested   Thin Liquid Thin Liquid: Within functional limits Presentation: Cup;Self Fed;Straw    Nectar Thick Nectar Thick Liquid: Not tested   Honey Thick Honey Thick Liquid: Not tested   Puree Puree: Within functional limits Presentation: Self Fed;Spoon   Solid   GO  Manuel Randolph, Manuel Randolph [phone]  Solid: Impaired Oral Phase Impairments: Impaired mastication Oral Phase Functional Implications: Prolonged oral transit        Manuel Randolph 04/12/2017,1:54 PM

## 2017-04-12 NOTE — Progress Notes (Signed)
Pt attempting to get out of bed, bed alarm has gone off several times, pt does not follow directions, unsteady gait, shakey at times, placed pt back to bed several times during the shift, High fall risk, safety sitter requested. SRP, RN

## 2017-04-12 NOTE — Progress Notes (Signed)
  Echocardiogram 2D Echocardiogram has been performed.  Deepti Gunawan G Conny Moening 04/12/2017, 12:02 PM

## 2017-04-12 NOTE — Progress Notes (Signed)
PROGRESS NOTE    Manuel Randolph  ZOX:[id number] DOB: 05-07-1945 DOA: 04/10/2017 PCP: [name], MD    Brief Narrative:  Manuel Greenhouse Sr. is a 72 y.o. male with medical history significant of dementia, dm, htn, [name] sent in from SNF for lethargy and high glucose level.  Pt cannot provide any history.  Per ED report, pt was found to have hyperkalemia with ekg changes which have both improved with ivf and treatment in the ED along with resolving aki.  Pt temp 93 F and with barehugger on.  Reported initially that cxr and ua were normal.  Pt was referred for admission for tele monitoring to ensure no further ekg changes and hyperkalemia remained resolved.      Assessment & Plan:   Principal Problem:   Sepsis (Manuel Randolph) Active Problems:   Aortic valve sclerosis   Chronic lymphocytic leukemia (Manuel Randolph)   Essential (primary) hypertension   Peripheral vascular disease (HCC)   Type 2 diabetes mellitus with stage 3 chronic kidney disease (HCC)   Anemia, pernicious   Multiple lacunar infarcts   Hypothermia   AKI (acute kidney injury) (Manuel Randolph)   Hyperkalemia   Acute metabolic encephalopathy   Dementia   HCAP (healthcare-associated pneumonia)   Acute pancreatitis without infection or necrosis  #1 probable sepsis likely secondary to HCAP Patient was admitted with concerns for sepsis as patient was noted to be hypothermic, chest x-ray abnormalities concerning for probable pneumonia and patient noted to have systolic blood pressures in the 90s, elevated lactic acid level, elevated creatinine/acute renal failure.  Patient currently off bear hugger and hypothermia seems to have improved.  TSH within normal limits.  CT head negative for any acute abnormalities.  The abdomen and pelvis with no acute abnormalities.  Lactic acid level is trending down. Procalcitonin was less than 0.10.  Respiratory viral panel PCR negative.  MRSA PCR negative.  Blood cultures pending.  Sputum Gram stain and  culture pending. Continue empiric IV cefepime.  DC vancomycin.  Continue Mucinex, duo nebs, Pulmicort.  Supportive care.  SLP evaluation pending.  Follow.  2.  Acute renal failure on CKD III Likely secondary to prerenal azotemia.  Improving with hydration.  Follow.  3.  Hyperkalemia Resolved.  4.  Probable healthcare associated pneumonia Per chest x-ray.  Patient noted to be hypothermic which has since resolved.  Sputum Gram stain and cultures pending.  Blood cultures pending.  Respiratory viral panel by PCR is negative.  MRSA PCR negative.  Continue empiric IV cefepime. D/C IV Vancomycin. Continue Pulmicort, Mucinex, duo nebs. SLP evaluation pending.  Follow.  5.  Hypertension Patient noted to have a significantly elevated blood pressure on admission.  Blood pressure improved on current regimen of Norvasc 10 mg daily and hydralazine 75 mg every 8 hours.  Continue current regimen and titrate as needed.  6.  Dementia Stable.  7.  Diabetes mellitus Hemoglobin A1c.  Continue sliding scale insulin.  8.  Murmur Per family patient has had a murmur in the past and nothing new.  2D echo has been done and is pending.      DVT prophylaxis: Lovenox Code Status: Full Family Communication: Updated family at bedside. Disposition Plan: Likely skilled nursing facility once medically stable.   Consultants:   None  Procedures:   CT head without contrast 04/10/2017  CT abdomen and pelvis 04/10/2017  CXR 04/10/2017  2D echo 04/11/2017.  Antimicrobials:   Vancomycin 04/11/2017>>>>>> 04/12/2017  IV cefepime 04/11/2017   Subjective: Sitting up  in bed getting fed.  Patient noted to be coughing with current soft diet.  Patient denies any chest pain.  Patient states feeling better than on admission.  Off Retail banker.   Objective: Vitals:   04/11/17 1351 04/11/17 1500 04/11/17 2140 04/12/17 0459  BP: (!) 178/78 (!) 185/54 (!) 146/53 (!) 146/61  Pulse:  98 76 64  Resp:  18 18 19   Temp:   98.6 F (37 C) 98.5 F (36.9 C) 98.1 F (36.7 C)  TempSrc:  Oral Axillary Axillary  SpO2:  100% 100% 96%  Weight:  82.6 kg (182 lb)    Height:  5\' 8"  (1.727 m)      Intake/Output Summary (Last 24 hours) at 04/12/2017 1217 Last data filed at 04/12/2017 0534 Gross per 24 hour  Intake 3577.5 ml  Output 150 ml  Net 3427.5 ml   Filed Weights   04/10/17 1145 04/11/17 1500  Weight: 59.4 kg (131 lb) 82.6 kg (182 lb)    Examination:  General exam: Appears calm and comfortable  Respiratory system: Clear to auscultation anterior lung fields.  Respiratory effort normal. Cardiovascular system: Regular rate and rhythm with a 3/6 systolic ejection murmur. No JVD, murmurs, rubs, gallops or clicks. No pedal edema. Gastrointestinal system: Abdomen is nondistended, soft and nontender. No organomegaly or masses felt. Normal bowel sounds heard. Central nervous system: Alert and oriented. No focal neurological deficits. Extremities: Symmetric 5 x 5 power. Skin: No rashes, lesions or ulcers Psychiatry: Judgement and insight appear normal. Mood & affect appropriate.     Data Reviewed: I have personally reviewed following labs and imaging studies  CBC: Recent Labs  Lab 04/10/17 1220 04/10/17 1232 04/10/17 1500 04/10/17 2359 04/11/17 0533 04/12/17 0425  WBC 9.7  --   --  9.4 8.1 9.2  NEUTROABS  --   --   --   --  4.6 6.9  HGB 11.0* 12.2* 10.5* 9.9* 10.9* 9.9*  HCT 34.8* 36.0* 31.0* 30.7* 34.3* 31.1*  MCV 93.0  --   --  92.7 93.2 92.3  PLT 159  --   --  166 191 585   Basic Metabolic Panel: Recent Labs  Lab 04/10/17 1230 04/10/17 1232 04/10/17 1500 04/10/17 2359 04/11/17 0533 04/12/17 0425  NA 133* 136 144  --  141 137  K 6.3* 5.6* 3.6  --  4.9 4.7  CL 100* 102 111  --  110 108  CO2 25  --   --   --  26 22  GLUCOSE 448* 466* 267*  --  102* 186*  BUN 49* 44* 35*  --  29* 26*  CREATININE 1.92* 1.80* 1.40* 1.44* 1.36* 1.49*  CALCIUM 10.2  --   --   --  9.8 9.2   GFR: Estimated  Creatinine Clearance: 48.4 mL/min (A) (by C-G formula based on SCr of 1.49 mg/dL (H)). Liver Function Tests: Recent Labs  Lab 04/10/17 1230 04/11/17 0533  AST 28 18  ALT 12* 14*  ALKPHOS 111 109  BILITOT 1.1 0.6  PROT 8.1 7.3  ALBUMIN 4.2 3.8   Recent Labs  Lab 04/10/17 1230  LIPASE 312*   No results for input(s): AMMONIA in the last 168 hours. Coagulation Profile: No results for input(s): INR, PROTIME in the last 168 hours. Cardiac Enzymes: No results for input(s): CKTOTAL, CKMB, CKMBINDEX, TROPONINI in the last 168 hours. BNP (last 3 results) No results for input(s): PROBNP in the last 8760 hours. HbA1C: No results for input(s): HGBA1C in the last 72  hours. CBG: Recent Labs  Lab 04/11/17 2144 04/12/17 0049 04/12/17 0455 04/12/17 0804 04/12/17 1156  GLUCAP 235* 257* 158* 131* 142*   Lipid Profile: Recent Labs    04/10/17 1230  CHOL 148  HDL 45  LDLCALC 91  TRIG 61  CHOLHDL 3.3   Thyroid Function Tests: Recent Labs    04/11/17 1245  TSH 1.556   Anemia Panel: No results for input(s): VITAMINB12, FOLATE, FERRITIN, TIBC, IRON, RETICCTPCT in the last 72 hours. Sepsis Labs: Recent Labs  Lab 04/10/17 1231 04/10/17 1759 04/10/17 2359 04/11/17 0242  PROCALCITON  --  <0.10  --   --   LATICACIDVEN 1.27  --  2.1* 1.5    Recent Results (from the past 240 hour(s))  Respiratory Panel by PCR     Status: None   Collection Time: 04/10/17 11:59 PM  Result Value Ref Range Status   Adenovirus NOT DETECTED NOT DETECTED Final   Coronavirus 229E NOT DETECTED NOT DETECTED Final   Coronavirus HKU1 NOT DETECTED NOT DETECTED Final   Coronavirus NL63 NOT DETECTED NOT DETECTED Final   Coronavirus OC43 NOT DETECTED NOT DETECTED Final   Metapneumovirus NOT DETECTED NOT DETECTED Final   Rhinovirus / Enterovirus NOT DETECTED NOT DETECTED Final   Influenza A NOT DETECTED NOT DETECTED Final   Influenza B NOT DETECTED NOT DETECTED Final   Parainfluenza Virus 1 NOT DETECTED  NOT DETECTED Final   Parainfluenza Virus 2 NOT DETECTED NOT DETECTED Final   Parainfluenza Virus 3 NOT DETECTED NOT DETECTED Final   Parainfluenza Virus 4 NOT DETECTED NOT DETECTED Final   Respiratory Syncytial Virus NOT DETECTED NOT DETECTED Final   Bordetella pertussis NOT DETECTED NOT DETECTED Final   Chlamydophila pneumoniae NOT DETECTED NOT DETECTED Final   Mycoplasma pneumoniae NOT DETECTED NOT DETECTED Final  MRSA PCR Screening     Status: None   Collection Time: 04/11/17  9:53 PM  Result Value Ref Range Status   MRSA by PCR NEGATIVE NEGATIVE Final    Comment:        The GeneXpert MRSA Assay (FDA approved for NASAL specimens only), is one component of a comprehensive MRSA colonization surveillance program. It is not intended to diagnose MRSA infection nor to guide or monitor treatment for MRSA infections.          Radiology Studies: Ct Abdomen Pelvis Wo Contrast  Result Date: 04/10/2017 CLINICAL DATA:  72 year old male with history of generalized weakness and hyperglycemia. EXAM: CT ABDOMEN AND PELVIS WITHOUT CONTRAST TECHNIQUE: Multidetector CT imaging of the abdomen and pelvis was performed following the standard protocol without IV contrast. COMPARISON:  No priors. FINDINGS: Lower chest: Extensive areas of septal thickening, thickening of the peribronchovascular interstitium, regional areas of architectural distortion and patchy areas of cylindrical bronchiectasis are noted throughout the lung bases bilaterally. A few scattered pulmonary nodules are noted in the lung bases measuring up to 6 mm in the left lower lobe and 7 mm in the periphery of the right middle lobe. Calcifications of the aortic valve and mitral annulus. Aortic atherosclerosis. Atherosclerotic calcifications in the left anterior descending and left circumflex coronary arteries. Hepatobiliary: No definite cystic or solid hepatic lesions are confidently identified on today's noncontrast CT examination.  Numerous tiny calcified gallstones lie dependently in the gallbladder. No findings to suggest an associated cholecystitis. Pancreas: No definite pancreatic mass or peripancreatic inflammatory changes are noted on today's noncontrast CT examination. Spleen: Unremarkable. Adrenals/Urinary Tract: Multiple calcifications in the kidneys bilaterally appear to be vascular. 12 mm  exophytic intermediate attenuation (41 HU) lesion extending off the posterior aspect of the lower pole of the left kidney, incompletely characterized on today's noncontrast CT examination, but statistically likely a small proteinaceous cyst. No hydroureteronephrosis. Urinary bladder is normal in appearance. Bilateral adrenal glands are normal in appearance. Stomach/Bowel: Normal appearance of the stomach. No pathologic dilatation of small bowel or colon. Normal appendix. Vascular/Lymphatic: Aortic atherosclerosis, without definite aneurysm in the abdominal or pelvic vasculature. No lymphadenopathy noted in the abdomen or pelvis. Reproductive: Prostate gland and seminal vesicles are unremarkable in appearance. Postoperative changes of penile implant noted, with reservoir in the lower right hemipelvis. Other: No significant volume of ascites.  No pneumoperitoneum. Musculoskeletal: There are no aggressive appearing lytic or blastic lesions noted in the visualized portions of the skeleton. Chronic appearing compression fracture of T12 with approximately 40% loss of anterior vertebral body height. IMPRESSION: 1. No acute findings noted in the abdomen or pelvis to account for the patient's symptoms. 2. Cholelithiasis without evidence of acute cholecystitis at this time. 3. Aortic atherosclerosis, in addition to at least 2 vessel coronary artery disease. Assessment for potential risk factor modification, dietary therapy or pharmacologic therapy may be warranted, if clinically indicated. 4. There are calcifications of the aortic valve and mitral annulus.  Echocardiographic correlation for evaluation of potential valvular dysfunction may be warranted if clinically indicated. 5. Additional incidental findings, as above. Aortic Atherosclerosis (ICD10-I70.0). Electronically Signed   By: Vinnie Langton M.D.   On: 04/10/2017 14:52   Dg Chest 2 View  Result Date: 04/12/2017 CLINICAL DATA:  Sepsis EXAM: CHEST  2 VIEW COMPARISON:  04/10/2017 FINDINGS: Mild cardiomegaly. Left base atelectasis or infiltrate. Right lung is clear. No effusions or acute bony abnormality. IMPRESSION: Left base atelectasis or infiltrate. Electronically Signed   By: Rolm Baptise M.D.   On: 04/12/2017 12:05   Dg Chest 2 View  Result Date: 04/10/2017 CLINICAL DATA:  Hypothermia EXAM: CHEST  2 VIEW COMPARISON:  11/13/2016 chest radiograph. FINDINGS: Slightly low lung volumes. Stable cardiomediastinal silhouette with normal heart size. No pneumothorax. No pleural effusion. No pulmonary edema. Hazy and curvilinear bibasilar lung opacities. IMPRESSION: Hazy and curvilinear bibasilar lung opacities with slightly low lung volumes. Favor atelectasis, difficult to exclude aspiration or developing pneumonia. Short-term follow-up PA and lateral chest radiographs advised. Electronically Signed   By: Ilona Sorrel M.D.   On: 04/10/2017 12:47   Ct Head Wo Contrast  Result Date: 04/10/2017 CLINICAL DATA:  Altered mental status with generalized weakness and hyperglycemia. EXAM: CT HEAD WITHOUT CONTRAST TECHNIQUE: Contiguous axial images were obtained from the base of the skull through the vertex without intravenous contrast. COMPARISON:  11/13/2016 FINDINGS: Brain: Examination demonstrates minimal age related atrophic change which is stable. Ventricles and cisterns are otherwise unchanged. There is chronic ischemic microvascular disease present. Old lacunar infarct over the left lentiform nucleus. Small old infarct in the right periventricular white matter and right basal ganglia. No evidence of focal  mass, mass effect, shift of midline structures or acute hemorrhage. Vascular: No hyperdense vessel or unexpected calcification. Skull: Normal. Negative for fracture or focal lesion. Sinuses/Orbits: Orbits are within normal. Paranasal sinuses are well developed as there is mild mucosal membrane thickening/opacification of on the ethmoid sinus and right maxillary sinus with small air-fluid level over the left maxillary sinus. Mastoid air cells are clear. Other: Prominence of the parotid glands right worse than left which are partially visualized. No definite mass or adenopathy. IMPRESSION: No acute findings. Mild atrophy with chronic ischemic microvascular  disease. Several small old central infarcts as described. Sinus inflammatory change as described. Electronically Signed   By: Marin Olp M.D.   On: 04/10/2017 17:33        Scheduled Meds: . amLODipine  10 mg Oral Daily  . budesonide (PULMICORT) nebulizer solution  0.5 mg Nebulization BID  . enoxaparin (LOVENOX) injection  40 mg Subcutaneous Q24H  . guaiFENesin  1,200 mg Oral BID  . hydrALAZINE  75 mg Oral Q8H  . insulin aspart  0-9 Units Subcutaneous Q4H  . ipratropium-albuterol  3 mL Nebulization TID   Continuous Infusions: . sodium chloride 100 mL/hr at 04/12/17 0534  . ceFEPime (MAXIPIME) IV 1 g (04/12/17 1031)     LOS: 2 days    Time spent: 30 mins    Irine Seal, MD [facility] Pager [phone]  If 7PM-7AM, please contact night-coverage [email or web address] Password [facility] 04/12/2017, 12:17 PM

## 2017-04-13 LAB — GLUCOSE, CAPILLARY
GLUCOSE-CAPILLARY: 133 mg/dL — AB (ref 65–99)
GLUCOSE-CAPILLARY: 239 mg/dL — AB (ref 65–99)
GLUCOSE-CAPILLARY: 225 mg/dL — AB (ref 65–99)
Glucose-Capillary: 109 mg/dL — ABNORMAL HIGH (ref 65–99)
Glucose-Capillary: 151 mg/dL — ABNORMAL HIGH (ref 65–99)
Glucose-Capillary: 80 mg/dL (ref 65–99)
Glucose-Capillary: 164 mg/dL — ABNORMAL HIGH (ref 65–99)

## 2017-04-13 LAB — BASIC METABOLIC PANEL
ANION GAP: 9 (ref 5–15)
BUN: 22 mg/dL — ABNORMAL HIGH (ref 6–20)
CALCIUM: 9 mg/dL (ref 8.9–10.3)
CO2: 20 mmol/L — ABNORMAL LOW (ref 22–32)
CREATININE: 1.43 mg/dL — AB (ref 0.61–1.24)
Chloride: 111 mmol/L (ref 101–111)
GFR, EST AFRICAN AMERICAN: 56 mL/min — AB (ref >60)
GFR, EST NON AFRICAN AMERICAN: 48 mL/min — AB (ref >60)
Glucose, Bld: 125 mg/dL — ABNORMAL HIGH (ref 65–99)
Potassium: 4.1 mmol/L (ref 3.5–5.1)
SODIUM: 140 mmol/L (ref 135–145)

## 2017-04-13 LAB — CBC
HEMATOCRIT: 29.2 % — AB (ref 39.0–52.0)
Hemoglobin: 9.4 g/dL — ABNORMAL LOW (ref 13.0–17.0)
MCH: 29.8 pg (ref 26.0–34.0)
MCHC: 32.2 g/dL (ref 30.0–36.0)
MCV: 92.7 fL (ref 78.0–100.0)
PLATELETS: 179 10*3/uL (ref 150–400)
RBC: 3.15 MIL/uL — ABNORMAL LOW (ref 4.22–5.81)
RDW: 13.8 % (ref 11.5–15.5)
WBC: 8.1 10*3/uL (ref 4.0–10.5)

## 2017-04-13 MED ORDER — QUETIAPINE FUMARATE 25 MG PO TABS
12.5000 mg | ORAL_TABLET | ORAL | Status: DC
  Administered 2017-04-14: 12.5 mg via ORAL
  Filled 2017-04-13: qty 1

## 2017-04-13 MED ORDER — INSULIN ASPART 100 UNIT/ML ~~LOC~~ SOLN
0.0000 [IU] | Freq: Three times a day (TID) | SUBCUTANEOUS | Status: DC
  Administered 2017-04-13: 3 [IU] via SUBCUTANEOUS
  Administered 2017-04-14: 7 [IU] via SUBCUTANEOUS
  Administered 2017-04-14: 2 [IU] via SUBCUTANEOUS
  Administered 2017-04-14: 1 [IU] via SUBCUTANEOUS
  Administered 2017-04-15: 3 [IU] via SUBCUTANEOUS

## 2017-04-13 MED ORDER — SODIUM CHLORIDE 0.9 % IV SOLN
INTRAVENOUS | Status: DC
  Administered 2017-04-13 (×2): via INTRAVENOUS

## 2017-04-13 MED ORDER — AMLODIPINE BESYLATE 5 MG PO TABS
5.0000 mg | ORAL_TABLET | Freq: Every day | ORAL | Status: DC
  Administered 2017-04-13 – 2017-04-14 (×2): 5 mg via ORAL
  Filled 2017-04-13 (×2): qty 1

## 2017-04-13 NOTE — Progress Notes (Signed)
Pharmacy Antibiotic Note  Manuel Randolph. is a 72 y.o. male admitted on 04/10/2017 with pneumonia.  Pharmacy was consulted for vancomycin and cefepime dosing.  Vancomycin was stopped on 1/12 and cefepime monotherapy continued  Today, 04/13/2017  Renal: CKD - appears SCr may be at baseline  WBC wnl  Afebrile,  Plan:  Continue cefepime 1gm IV q12h  Await ability to de-escalate empiric antibiotics  Height: 5\' 8"  (172.7 cm) Weight: 182 lb (82.6 kg) IBW/kg (Calculated) : 68.4  Temp (24hrs), Avg:98.4 F (36.9 C), Min:98.2 F (36.8 C), Max:98.7 F (37.1 C)  Recent Labs  Lab 04/10/17 1220  04/10/17 1231  04/10/17 1500 04/10/17 2359 04/11/17 0242 04/11/17 0533 04/12/17 0425 04/13/17 0815  WBC 9.7  --   --   --   --  9.4  --  8.1 9.2 8.1  CREATININE  --    < >  --    < > 1.40* 1.44*  --  1.36* 1.49* 1.43*  LATICACIDVEN  --   --  1.27  --   --  2.1* 1.5  --   --   --    < > = values in this interval not displayed.    Estimated Creatinine Clearance: 50.4 mL/min (A) (by C-G formula based on SCr of 1.43 mg/dL (H)).    No Known Allergies  Antimicrobials this admission: 1/10 vanco >> 1/10 cefepime >>  Dose adjustments this admission:  Microbiology results: 1/10 BCx: ngtd 1/10 strep Ag: neg 1/10 flu panel: neg 1/10 resp panel: neg 1/11 MRSA PCR: neg Sputum: ordered   Thank you for allowing pharmacy to be a part of this patient's care.  Clayburn Pert, PharmD, BCPS Pager: [phone] 04/13/2017  10:58 AM

## 2017-04-13 NOTE — Progress Notes (Signed)
  Speech Language Pathology Treatment: Dysphagia  Patient Details Name: Manuel DEBES Sr. MRN: [id number] DOB: 13-May-1945 Today's Date: 04/13/2017 Time: 8657-8469 SLP Time Calculation (min) (ACUTE ONLY): 15 min  Assessment / Plan / Recommendation Clinical Impression  SLP provided skilled observation during lunch meal. Pt continues to have coughing with larger, self-fed bites and even smaller bites of more solid textures, such as meats. Mastication is quite prolonged with small bites of pork. He had more efficient oral preparation and seemingly better airway protection with the softer, chopped textures on his tray and with his thin liquids. Recommend Dys 2 diet and thin liquids with full supervision during meals.   HPI HPI: Manuel Randolph Manuel Randolph a 72 y.o.malewith medical history significant ofdementia, dm, htn, [name] sent in from SNF for lethargy and high glucose level. Dx with sepsis. CXR concerning for left sided PNA. MD noted patient coughing with pos this am raising concern for possible aspiration PNA. Evaluated by SLP at bedside during 10/2016 admission and patient with oral dysphagia but recommendations for regular solids, thin liquid.       SLP Plan  Continue with current plan of care       Recommendations  Diet recommendations: Dysphagia 2 (fine chop);Thin liquid Liquids provided via: Cup;Straw Medication Administration: Crushed with puree Supervision: Staff to assist with self feeding;Full supervision/cueing for compensatory strategies Compensations: Slow rate;Small sips/bites Postural Changes and/or Swallow Maneuvers: Seated upright 90 degrees                Oral Care Recommendations: Oral care BID Follow up Recommendations: (TBD) SLP Visit Diagnosis: Dysphagia, oropharyngeal phase (R13.12) Plan: Continue with current plan of care       GO                Germain Osgood 04/13/2017, 2:33 PM  Germain Osgood, M.A. CCC-SLP [phone]

## 2017-04-13 NOTE — Evaluation (Signed)
Physical Therapy Evaluation Patient Details Name: SKYLUR FUSTON Sr. MRN: [id number] DOB: 1945-11-22 Today's Date: 04/13/2017   History of Present Illness  72 yo male admitted with sepsis, pna. Hx of dementia.   Clinical Impression  On eval, pt required Mod assist for bed mobility. Each time therapist wound get pt to EOB, pt would return legs to bed. Sat up x 2. Unable to get pt to sit at EOB long enough to attempt standing. No family present during session. Recommend SNF. Will follow and progress activity as able.     Follow Up Recommendations SNF    Equipment Recommendations  None recommended by PT    Recommendations for Other Services       Precautions / Restrictions Precautions Precautions: Fall Restrictions Weight Bearing Restrictions: No      Mobility  Bed Mobility Overal bed mobility: Needs Assistance Bed Mobility: Supine to Sit;Sit to Supine     Supine to sit: Mod assist;HOB elevated Sit to supine: Mod assist;HOB elevated   General bed mobility comments: Assist for trunk and LEs. Pt repeatedly kept putting legs back on bed. He would not stay sitting at EOB/nor would he attempt standing.   Transfers                    Ambulation/Gait                Stairs            Wheelchair Mobility    Modified Rankin (Stroke Patients Only)       Balance                                             Pertinent Vitals/Pain Pain Assessment: Faces Faces Pain Scale: No hurt    Home Living Family/patient expects to be discharged to:: Skilled nursing facility                      Prior Function Level of Independence: Needs assistance   Gait / Transfers Assistance Needed: unsure of PLOF.            Hand Dominance        Extremity/Trunk Assessment   Upper Extremity Assessment Upper Extremity Assessment: Generalized weakness    Lower Extremity Assessment Lower Extremity Assessment: Generalized weakness    Cervical / Trunk Assessment Cervical / Trunk Assessment: Kyphotic  Communication      Cognition Arousal/Alertness: Awake/alert Behavior During Therapy: WFL for tasks assessed/performed Overall Cognitive Status: History of cognitive impairments - at baseline                                        General Comments      Exercises     Assessment/Plan    PT Assessment Patient needs continued PT services  PT Problem List Decreased cognition;Decreased mobility;Decreased strength;Decreased balance       PT Treatment Interventions Functional mobility training;DME instruction;Balance training;Therapeutic activities;Therapeutic exercise;Gait training    PT Goals (Current goals can be found in the Care Plan section)  Acute Rehab PT Goals Patient Stated Goal: none stated PT Goal Formulation: Patient unable to participate in goal setting Time For Goal Achievement: 04/27/17 Potential to Achieve Goals: Fair    Frequency Min 2X/week   Barriers to  discharge        Co-evaluation               AM-PAC PT "6 Clicks" Daily Activity  Outcome Measure Difficulty turning over in bed (including adjusting bedclothes, sheets and blankets)?: Unable Difficulty moving from lying on back to sitting on the side of the bed? : Unable Difficulty sitting down on and standing up from a chair with arms (e.g., wheelchair, bedside commode, etc,.)?: Unable Help needed moving to and from a bed to chair (including a wheelchair)?: Total Help needed walking in hospital room?: Total Help needed climbing 3-5 steps with a railing? : Total 6 Click Score: 6    End of Session   Activity Tolerance: Other (comment)(treatment limited by pt's unwillingness to participate) Patient left: in bed;with call bell/phone within reach;with bed alarm set   PT Visit Diagnosis: Muscle weakness (generalized) (M62.81);Difficulty in walking, not elsewhere classified (R26.2)    Time: 3710-6269 PT Time  Calculation (min) (ACUTE ONLY): 9 min   Charges:   PT Evaluation $PT Eval Moderate Complexity: 1 Mod     PT G Codes:          Weston Anna, MPT Pager: [phone]

## 2017-04-13 NOTE — Progress Notes (Signed)
PROGRESS NOTE    Trigger Frasier  HCW:[id number] DOB: [birth year] DOA: 04/10/2017 PCP: [name], MD    Brief Narrative:  Manuel Greenhouse Sr. is a 72 y.o. male with medical history significant of dementia, dm, htn, [name] sent in from SNF for lethargy and high glucose level.  Pt cannot provide any history.  Per ED report, pt was found to have hyperkalemia with ekg changes which have both improved with ivf and treatment in the ED along with resolving aki.  Pt temp 93 F and with barehugger on.  Reported initially that cxr and ua were normal.  Pt was referred for admission for tele monitoring to ensure no further ekg changes and hyperkalemia remained resolved.      Assessment & Plan:   Principal Problem:   Sepsis ([place]) Active Problems:   Aortic valve sclerosis   Chronic lymphocytic leukemia ([place])   Essential (primary) hypertension   Peripheral vascular disease (HCC)   Type 2 diabetes mellitus with stage 3 chronic kidney disease (HCC)   Anemia, pernicious   Multiple lacunar infarcts   Hypothermia   AKI (acute kidney injury) ([place])   Hyperkalemia   Acute metabolic encephalopathy   Dementia   HCAP (healthcare-associated pneumonia)   Acute pancreatitis without infection or necrosis  #1 probable sepsis likely secondary to HCAP Patient was admitted with concerns for sepsis as patient was noted to be hypothermic, chest x-ray abnormalities concerning for probable pneumonia and patient noted to have systolic blood pressures in the 90s, elevated lactic acid level, elevated creatinine/acute renal failure.  Patient currently off bear hugger and hypothermia seems to have improved.  TSH within normal limits.  CT head negative for any acute abnormalities.  The abdomen and pelvis with no acute abnormalities.  Lactic acid level is trending down. Procalcitonin was less than 0.10.  Respiratory viral panel PCR negative.  MRSA PCR negative.  Blood cultures pending.  Sputum Gram stain and  culture pending. Continue empiric IV cefepime.  DC vancomycin.  Continue Mucinex, duo nebs, Pulmicort.  Supportive care.  Assessed by speech therapy and placed on a dysphagia 2 diet.  Will likely transition to oral Augmentin tomorrow.  Follow.  2.  Acute renal failure on CKD III Likely secondary to prerenal azotemia.  Improving with hydration.  Follow.  3.  Hyperkalemia Resolved.  4.  Probable healthcare associated pneumonia Per chest x-ray.  Patient noted to be hypothermic which has since resolved.  Sputum Gram stain and cultures pending.  Blood cultures pending.  Respiratory viral panel by PCR is negative.  MRSA PCR negative.  Continue empiric IV cefepime. D/C'd IV Vancomycin. Continue Pulmicort, Mucinex, duo nebs. SLP evaluation pending.  Likely transition to oral Augmentin tomorrow.  Follow.  5.  Hypertension Patient noted to have a significantly elevated blood pressure on admission.  Blood pressure improved on current regimen of Norvasc 10 mg daily and hydralazine 75 mg every 8 hours.  Continue current regimen and titrate as needed.  6.  Dementia Stable.  Placed on Seroquel 12.5 mg every morning and Seroquel 25 mg nightly.  7.  Diabetes mellitus CBGs have ranged from 80 -164.  Check Hemoglobin A1c.  Continue sliding scale insulin.  8.  Murmur Per family patient has had a murmur in the past and nothing new.  2D echo with EF 60-65 % with NWMA, grade 1 diastolic dysfunction, mild aortic valvular stenosis, calcified mitral valvular annulus.  Outpatient follow-up.      DVT prophylaxis: Lovenox Code Status: Full  Family Communication: Updated family at bedside. Disposition Plan: Likely skilled nursing facility once medically stable.   Consultants:   None  Procedures:   CT head without contrast 04/10/2017  CT abdomen and pelvis 04/10/2017  CXR 04/10/2017  2D echo 04/11/2017.  Antimicrobials:   Vancomycin 04/11/2017>>>>>> 04/12/2017  IV cefepime  04/11/2017   Subjective: Sitting up in bed.  States he is feeling better.  Denies chest pain.  Denies shortness of breath.   Objective: Vitals:   04/12/17 2039 04/12/17 2209 04/13/17 0614 04/13/17 0625  BP:  (!) 152/57 (!) 114/35   Pulse:  72 60   Resp:  18 18   Temp:  98.7 F (37.1 C) 98.2 F (36.8 C)   TempSrc:  Axillary Axillary   SpO2: 96% 100% 100% 98%  Weight:      Height:        Intake/Output Summary (Last 24 hours) at 04/13/2017 1214 Last data filed at 04/13/2017 0300 Gross per 24 hour  Intake 2446.67 ml  Output -  Net 2446.67 ml   Filed Weights   04/10/17 1145 04/11/17 1500  Weight: 59.4 kg (131 lb) 82.6 kg (182 lb)    Examination:  General exam: NAD Respiratory system: Clear to auscultation bilaterally.  No wheezes, no crackles.  Some coarse breath sound.  Respiratory effort normal. Cardiovascular system: Regular rate and rhythm with a 3/6 systolic ejection murmur. No JVD, murmurs, rubs, gallops or clicks. No pedal edema. Gastrointestinal system: Abdomen is, nontender, nondistended, positive bowel sounds.  No hepatosplenomegaly.  Normal bowel sounds heard. Central nervous system: Alert and oriented. No focal neurological deficits. Extremities: Symmetric 5 x 5 power. Skin: No rashes, lesions or ulcers Psychiatry: Judgement and insight appear normal. Mood & affect appropriate.     Data Reviewed: I have personally reviewed following labs and imaging studies  CBC: Recent Labs  Lab 04/10/17 1220  04/10/17 1500 04/10/17 2359 04/11/17 0533 04/12/17 0425 04/13/17 0815  WBC 9.7  --   --  9.4 8.1 9.2 8.1  NEUTROABS  --   --   --   --  4.6 6.9  --   HGB 11.0*   < > 10.5* 9.9* 10.9* 9.9* 9.4*  HCT 34.8*   < > 31.0* 30.7* 34.3* 31.1* 29.2*  MCV 93.0  --   --  92.7 93.2 92.3 92.7  PLT 159  --   --  166 191 184 179   < > = values in this interval not displayed.   Basic Metabolic Panel: Recent Labs  Lab 04/10/17 1230 04/10/17 1232 04/10/17 1500  04/10/17 2359 04/11/17 0533 04/12/17 0425 04/13/17 0815  NA 133* 136 144  --  141 137 140  K 6.3* 5.6* 3.6  --  4.9 4.7 4.1  CL 100* 102 111  --  110 108 111  CO2 25  --   --   --  26 22 20*  GLUCOSE 448* 466* 267*  --  102* 186* 125*  BUN 49* 44* 35*  --  29* 26* 22*  CREATININE 1.92* 1.80* 1.40* 1.44* 1.36* 1.49* 1.43*  CALCIUM 10.2  --   --   --  9.8 9.2 9.0   GFR: Estimated Creatinine Clearance: 50.4 mL/min (A) (by C-G formula based on SCr of 1.43 mg/dL (H)). Liver Function Tests: Recent Labs  Lab 04/10/17 1230 04/11/17 0533  AST 28 18  ALT 12* 14*  ALKPHOS 111 109  BILITOT 1.1 0.6  PROT 8.1 7.3  ALBUMIN 4.2 3.8   Recent  Labs  Lab 04/10/17 1230  LIPASE 312*   No results for input(s): AMMONIA in the last 168 hours. Coagulation Profile: No results for input(s): INR, PROTIME in the last 168 hours. Cardiac Enzymes: No results for input(s): CKTOTAL, CKMB, CKMBINDEX, TROPONINI in the last 168 hours. BNP (last 3 results) No results for input(s): PROBNP in the last 8760 hours. HbA1C: No results for input(s): HGBA1C in the last 72 hours. CBG: Recent Labs  Lab 04/12/17 1647 04/12/17 2111 04/13/17 0018 04/13/17 0409 04/13/17 0748  GLUCAP 150* 133* 151* 80 109*   Lipid Profile: Recent Labs    04/10/17 1230  CHOL 148  HDL 45  LDLCALC 91  TRIG 61  CHOLHDL 3.3   Thyroid Function Tests: Recent Labs    04/11/17 1245  TSH 1.556   Anemia Panel: No results for input(s): VITAMINB12, FOLATE, FERRITIN, TIBC, IRON, RETICCTPCT in the last 72 hours. Sepsis Labs: Recent Labs  Lab 04/10/17 1231 04/10/17 1759 04/10/17 2359 04/11/17 0242  PROCALCITON  --  <0.10  --   --   LATICACIDVEN 1.27  --  2.1* 1.5    Recent Results (from the past 240 hour(s))  Culture, blood (routine x 2) Call MD if unable to obtain prior to antibiotics being given     Status: None (Preliminary result)   Collection Time: 04/10/17 11:44 PM  Result Value Ref Range Status   Specimen  Description BLOOD LEFT ANTECUBITAL  Final   Special Requests IN PEDIATRIC BOTTLE Blood Culture adequate volume  Final   Culture   Final    NO GROWTH 1 DAY Performed at [facility], [place] [address], [place], [place] [address]    Report Status PENDING  Incomplete  Culture, blood (routine x 2) Call MD if unable to obtain prior to antibiotics being given     Status: None (Preliminary result)   Collection Time: 04/10/17 11:59 PM  Result Value Ref Range Status   Specimen Description BLOOD LEFT ANTECUBITAL  Final   Special Requests IN PEDIATRIC BOTTLE Blood Culture adequate volume  Final   Culture   Final    NO GROWTH 1 DAY Performed at [facility], [place] [address], [place], [place] [address]    Report Status PENDING  Incomplete  Respiratory Panel by PCR     Status: None   Collection Time: 04/10/17 11:59 PM  Result Value Ref Range Status   Adenovirus NOT DETECTED NOT DETECTED Final   Coronavirus 229E NOT DETECTED NOT DETECTED Final   Coronavirus HKU1 NOT DETECTED NOT DETECTED Final   Coronavirus NL63 NOT DETECTED NOT DETECTED Final   Coronavirus OC43 NOT DETECTED NOT DETECTED Final   Metapneumovirus NOT DETECTED NOT DETECTED Final   Rhinovirus / Enterovirus NOT DETECTED NOT DETECTED Final   Influenza A NOT DETECTED NOT DETECTED Final   Influenza B NOT DETECTED NOT DETECTED Final   Parainfluenza Virus 1 NOT DETECTED NOT DETECTED Final   Parainfluenza Virus 2 NOT DETECTED NOT DETECTED Final   Parainfluenza Virus 3 NOT DETECTED NOT DETECTED Final   Parainfluenza Virus 4 NOT DETECTED NOT DETECTED Final   Respiratory Syncytial Virus NOT DETECTED NOT DETECTED Final   Bordetella pertussis NOT DETECTED NOT DETECTED Final   Chlamydophila pneumoniae NOT DETECTED NOT DETECTED Final   Mycoplasma pneumoniae NOT DETECTED NOT DETECTED Final  MRSA PCR Screening     Status: None   Collection Time: 04/11/17  9:53 PM  Result Value Ref Range Status   MRSA by PCR NEGATIVE NEGATIVE  Final     Comment:        The GeneXpert MRSA Assay (FDA approved for NASAL specimens only), is one component of a comprehensive MRSA colonization surveillance program. It is not intended to diagnose MRSA infection nor to guide or monitor treatment for MRSA infections.          Radiology Studies: Dg Chest 2 View  Result Date: 04/12/2017 CLINICAL DATA:  Sepsis EXAM: CHEST  2 VIEW COMPARISON:  04/10/2017 FINDINGS: Mild cardiomegaly. Left base atelectasis or infiltrate. Right lung is clear. No effusions or acute bony abnormality. IMPRESSION: Left base atelectasis or infiltrate. Electronically Signed   By: Rolm Baptise M.D.   On: 04/12/2017 12:05        Scheduled Meds: . amLODipine  5 mg Oral Daily  . budesonide (PULMICORT) nebulizer solution  0.5 mg Nebulization BID  . enoxaparin (LOVENOX) injection  40 mg Subcutaneous Q24H  . guaiFENesin  1,200 mg Oral BID  . hydrALAZINE  75 mg Oral Q8H  . insulin aspart  0-9 Units Subcutaneous Q4H  . ipratropium-albuterol  3 mL Nebulization BID  . QUEtiapine  25 mg Oral QHS   Continuous Infusions: . sodium chloride 75 mL/hr at 04/13/17 1015  . ceFEPime (MAXIPIME) IV Stopped (04/13/17 1142)     LOS: 3 days    Time spent: 35 mins    Irine Seal, MD [facility] Pager [phone]  If 7PM-7AM, please contact night-coverage [email or web address] Password [facility] 04/13/2017, 12:14 PM

## 2017-04-14 DIAGNOSIS — F0391 Unspecified dementia with behavioral disturbance: Secondary | ICD-10-CM

## 2017-04-14 LAB — BASIC METABOLIC PANEL
Anion gap: 8 (ref 5–15)
BUN: 18 mg/dL (ref 6–20)
CALCIUM: 8.9 mg/dL (ref 8.9–10.3)
CO2: 22 mmol/L (ref 22–32)
CREATININE: 1.32 mg/dL — AB (ref 0.61–1.24)
Chloride: 110 mmol/L (ref 101–111)
GFR, EST NON AFRICAN AMERICAN: 53 mL/min — AB (ref >60)
Glucose, Bld: 146 mg/dL — ABNORMAL HIGH (ref 65–99)
Potassium: 4 mmol/L (ref 3.5–5.1)
SODIUM: 140 mmol/L (ref 135–145)

## 2017-04-14 LAB — CBC
HCT: 29.8 % — ABNORMAL LOW (ref 39.0–52.0)
HEMOGLOBIN: 9.5 g/dL — AB (ref 13.0–17.0)
MCH: 29.4 pg (ref 26.0–34.0)
MCHC: 31.9 g/dL (ref 30.0–36.0)
MCV: 92.3 fL (ref 78.0–100.0)
Platelets: 167 10*3/uL (ref 150–400)
RBC: 3.23 MIL/uL — ABNORMAL LOW (ref 4.22–5.81)
RDW: 13.4 % (ref 11.5–15.5)
WBC: 7.7 10*3/uL (ref 4.0–10.5)

## 2017-04-14 LAB — GLUCOSE, CAPILLARY
GLUCOSE-CAPILLARY: 151 mg/dL — AB (ref 65–99)
Glucose-Capillary: 339 mg/dL — ABNORMAL HIGH (ref 65–99)
Glucose-Capillary: 134 mg/dL — ABNORMAL HIGH (ref 65–99)
Glucose-Capillary: 86 mg/dL (ref 65–99)

## 2017-04-14 LAB — HEMOGLOBIN A1C
HEMOGLOBIN A1C: 10.7 % — AB (ref 4.8–5.6)
MEAN PLASMA GLUCOSE: 260.39 mg/dL

## 2017-04-14 MED ORDER — AMLODIPINE BESYLATE 5 MG PO TABS
5.0000 mg | ORAL_TABLET | Freq: Once | ORAL | Status: AC
  Administered 2017-04-14: 5 mg via ORAL
  Filled 2017-04-14: qty 1

## 2017-04-14 MED ORDER — INSULIN GLARGINE 100 UNIT/ML ~~LOC~~ SOLN
12.0000 [IU] | Freq: Every day | SUBCUTANEOUS | Status: DC
  Administered 2017-04-14 – 2017-04-15 (×2): 12 [IU] via SUBCUTANEOUS
  Filled 2017-04-14 (×2): qty 0.12

## 2017-04-14 MED ORDER — AMOXICILLIN-POT CLAVULANATE 875-125 MG PO TABS
1.0000 | ORAL_TABLET | Freq: Two times a day (BID) | ORAL | Status: DC
  Administered 2017-04-14 – 2017-04-15 (×3): 1 via ORAL
  Filled 2017-04-14 (×3): qty 1

## 2017-04-14 MED ORDER — AMLODIPINE BESYLATE 10 MG PO TABS
10.0000 mg | ORAL_TABLET | Freq: Every day | ORAL | Status: DC
  Administered 2017-04-15: 10 mg via ORAL
  Filled 2017-04-14: qty 1

## 2017-04-14 NOTE — NC FL2 (Signed)
[place] LEVEL OF CARE SCREENING TOOL     IDENTIFICATION  Patient Name: Manuel MCGRAIL Sr. Birthdate: Oct 08, 1945 Sex: male Admission Date (Current Location): 04/10/2017  [facility] and [place] Number:  Herbalist and Address:  [facility],  [place], [place]      Provider Number: [id number]  Attending Physician Name and Address:  [name], MD  Relative Name and Phone Number:  Daughter Kaceton Vieau [phone]    Current Level of Care: Hospital Recommended Level of Care: [place] Prior Approval Number:    Date Approved/Denied:   PASRR Number: [id number]  Discharge Plan: SNF    Current Diagnoses: Patient Active Problem List   Diagnosis Date Noted  . Acute pancreatitis without infection or necrosis   . Hypothermia 04/10/2017  . AKI (acute kidney injury) ([place]) 04/10/2017  . Hyperkalemia 04/10/2017  . Acute metabolic encephalopathy 26/94/8546  . Dementia 04/10/2017  . Sepsis ([place]) 04/10/2017  . HCAP (healthcare-associated pneumonia) 04/10/2017  . Rhabdomyolysis 11/14/2016  . Multiple lacunar infarcts 11/06/2016  . Small vessel disease, cerebrovascular 11/06/2016  . Syncope 09/13/2016  . Dermatitis 03/01/2016  . Anemia, pernicious 07/04/2015  . Hip pain 07/04/2015  . Aortic valve sclerosis 02/19/2015  . Chronic lymphocytic leukemia ([place]) 02/19/2015  . DM type 2 with diabetic peripheral neuropathy ([place]) 02/19/2015  . Essential (primary) hypertension 02/19/2015  . DM type 2 with diabetic mixed hyperlipidemia ([place]) 02/19/2015  . Diabetic peripheral neuropathy associated with type 2 diabetes mellitus ([place]) 02/19/2015  . Bleeding ulcer 02/19/2015  . Peripheral vascular disease ([place]) 02/19/2015  . 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 Encounter for screening for malignant neoplasm of prostate 02/19/2015  . Type 2 diabetes mellitus with stage 3 chronic kidney disease ([place])  02/19/2015  . Compulsive tobacco user syndrome 02/19/2015  . 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  . Aortic valve defect 12/22/2012  . 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[place]) 11/13/2011    Orientation RESPIRATION BLADDER Height & Weight     Self  Normal Indwelling catheter Weight: 183 lb 14.4 oz (83.4 kg) Height:  5\' 8"  (172.7 cm)  BEHAVIORAL SYMPTOMS/MOOD NEUROLOGICAL BOWEL NUTRITION STATUS  Other (Comment)(no behaviors)     Diet(see dc summary)  AMBULATORY STATUS COMMUNICATION OF NEEDS Skin   Total Care Verbally Normal                       Personal Care Assistance Level of Assistance  Bathing, Feeding, Dressing Bathing Assistance: Maximum assistance Feeding assistance: Limited assistance Dressing Assistance: Maximum assistance     Functional Limitations Info  Sight, Hearing, Speech Sight Info: Adequate Hearing Info: Impaired Speech Info: Adequate    SPECIAL CARE FACTORS FREQUENCY  PT (By licensed PT), OT (By licensed OT)     PT Frequency: 5x/week OT Frequency: 5x/week            Contractures Contractures Info: Not present    Additional Factors Info  Code Status, Allergies Code Status Info: Full Code Allergies Info: NKA           Current Medications (04/14/2017):  This is the current hospital active medication list Current Facility-Administered Medications  Medication Dose Route Frequency Provider Last Rate Last Dose  . 0.9 %  sodium chloride infusion   Intravenous Continuous [name], MD 75 mL/hr at 04/13/17 2124    . amLODipine (NORVASC) tablet 5 mg  5 mg Oral Daily [name],  MD   5 mg at 04/14/17 0937  . amoxicillin-clavulanate (AUGMENTIN) 875-125 MG per tablet 1 tablet  1 tablet Oral Q12H [name], MD   1 tablet at 04/14/17 [phone]  . budesonide (PULMICORT) nebulizer solution 0.5 mg  0.5 mg Nebulization BID [name], MD   0.5 mg at 04/14/17 1657  . enoxaparin (LOVENOX) injection  40 mg  40 mg Subcutaneous Q24H Derrill Kay A, MD   40 mg at 04/13/17 2123  . guaiFENesin (MUCINEX) 12 hr tablet 1,200 mg  1,200 mg Oral BID [name], MD   1,200 mg at 04/14/17 9038  . hydrALAZINE (APRESOLINE) tablet 75 mg  75 mg Oral Q8H [name], MD   75 mg at 04/14/17 0521  . insulin aspart (novoLOG) injection 0-9 Units  0-9 Units Subcutaneous TID AC & HS Blount, Lolita Cram, NP   2 Units at 04/14/17 0830  . iopamidol (ISOVUE-300) 61 % injection 30 mL  30 mL Oral Once PRN Tanna Furry, MD      . ipratropium-albuterol (DUONEB) 0.5-2.5 (3) MG/3ML nebulizer solution 3 mL  3 mL Nebulization Q2H PRN [name], MD      . ipratropium-albuterol (DUONEB) 0.5-2.5 (3) MG/3ML nebulizer solution 3 mL  3 mL Nebulization BID [name], MD   3 mL at 04/14/17 0828  . QUEtiapine (SEROQUEL) tablet 25 mg  25 mg Oral QHS [name], MD   25 mg at 04/13/17 2124     Discharge Medications: Please see discharge summary for a list of discharge medications.  Relevant Imaging Results:  Relevant Lab Results:   Additional Information SS# [id number]  Patient has dementia (no behaviors)  Burnis Medin, LCSW

## 2017-04-14 NOTE — Clinical Social Work Note (Signed)
Clinical Social Work Assessment  Patient Details  Name: Manuel MACCHIA Sr. MRN: [id number] Date of Birth: 08/07/1945  Date of referral:  04/14/17               Reason for consult:  Facility Placement                Permission sought to share information with:  Facility Sport and exercise psychologist, Family Supports Permission granted to share information::     Name::     Zadrian Mccauley  Agency::     Relationship::  Daughter  Contact Information:  [phone]  Housing/Transportation Living arrangements for the past 2 months:  Assisted Living Facility([facility]) Source of Information:  Adult Children Patient Interpreter Needed:  None Criminal Activity/Legal Involvement Pertinent to Current Situation/Hospitalization:  No - Comment as needed Significant Relationships:  Adult Children Lives with:  Facility Resident Do you feel safe going back to the place where you live?  (PT recommending SNF) Need for family participation in patient care:  Yes (Comment)  Care giving concerns:  Patient from [place]. Patient's daughter reported that at baseline patient was able to ambulate with walker and needed assistance with bathing and dressing. PT recommending SNF for ST rehab.    Social Worker assessment / plan:  CSW spoke with patient's daughter/POA Koda Routon [phone]) regarding PT recommendation for SNF. Patient's daughter reported that she is agreeable to SNF for ST rehab. CSW explained SNF placement process, patient's daughter verbalized understanding. Patient's daughter reported that patient has been to SNF in the past and that they prefer a SNF in [place].   CSW completed FL2 and will follow up with bed offers.  CSW will continue to follow and assist with discharge planning.  Employment status:  Retired Forensic scientist:    PT Recommendations:  [place] / Referral to community resources:  [place]  Patient/Family's Response to care:  Patient's daughter agreeable to SNF for ST rehab.   Patient/Family's Understanding of and Emotional Response to Diagnosis, Current Treatment, and Prognosis:  Patient's daughter involved in patient's care and verbalized some understanding in patient's current treatment. Patient's daughter verbalized plan for patient to dc to SNF for ST rehab.   Emotional Assessment Appearance:    Attitude/Demeanor/Rapport:  Unable to Assess Affect (typically observed):  Unable to Assess Orientation:  Oriented to Self Alcohol / Substance use:  Not Applicable Psych involvement (Current and /or in the community):  No (Comment)  Discharge Needs  Concerns to be addressed:  Care Coordination Readmission within the last 30 days:  No Current discharge risk:  Physical Impairment Barriers to Discharge:  Continued Medical Work up   [employer], LCSW 04/14/2017, 1:00 PM

## 2017-04-14 NOTE — Care Management Important Message (Signed)
Important Message  Patient Details  Name: Manuel HABENICHT Sr. MRN: [id number] Date of Birth: May 09, 1945   Medicare Important Message Given:  Yes    Karas, Pickerill 04/14/2017, 11:55 AMImportant Message  Patient Details  Name: Manuel SANTONE Sr. MRN: [id number] Date of Birth: 11/24/1945   Medicare Important Message Given:  Yes    Sloane, Junkin 04/14/2017, 11:55 AM

## 2017-04-14 NOTE — Progress Notes (Signed)
Inpatient Diabetes Program Recommendations  AACE/ADA: New Consensus Statement on Inpatient Glycemic Control (2015)  Target Ranges:  Prepandial:   less than 140 mg/dL      Peak postprandial:   less than 180 mg/dL (1-2 hours)      Critically ill patients:  140 - 180 mg/dL   Lab Results  Component Value Date   GLUCAP 339 (H) 04/14/2017   HGBA1C 10.7 (H) 04/14/2017    Review of Glycemic ControlResults for NICO, SYME (MRN [id number]) as of 04/14/2017 14:05  Ref. Range 04/13/2017 07:48 04/13/2017 08:15 04/13/2017 12:34 04/13/2017 17:20 04/13/2017 21:14 04/14/2017 04:44 04/14/2017 07:40 04/14/2017 12:03  Glucose-Capillary Latest Ref Range: 65 - 99 mg/dL 109 (H)  164 (H) 239 (H) 225 (H)  151 (H) 339 (H)   Diabetes history: Type 2 DM Outpatient Diabetes medications: Metformin 1000 mg q Am and 500 mg q PM Current orders for Inpatient glycemic control:  Novolog sensitive tid with meals and HS  Inpatient Diabetes Program Recommendations:    Please change bedtime scale to HS scale on glycemic control order set.  Also may consider adding low dose basal insulin due to elevated A1C.  May consider adding Lantus 12 units daily.    Thanks,  Adah Perl, RN, BC-ADM Inpatient Diabetes Coordinator Pager [phone] (8a-5p)

## 2017-04-14 NOTE — Progress Notes (Signed)
OT Cancellation Note  Patient Details Name: Manuel FURUYA Sr. MRN: [id number] DOB: 1945-10-01   Cancelled Treatment:    Reason Eval/Treat Not Completed: Other (comment)  Per chart pt from [facility] with dementia and at baseline only oriented to self.  Pt needed significant A with mobility with PT. Will defer OT eval back to facility  [name], [name]  [name] 04/14/2017, 12:39 PM

## 2017-04-14 NOTE — Progress Notes (Signed)
PROGRESS NOTE    Manuel Randolph  KYH:[id number] DOB: 1946/02/15 DOA: 04/10/2017 PCP: [name], MD    Brief Narrative:  Manuel Greenhouse Sr. is a 72 y.o. male with medical history significant of dementia, dm, htn, [name] sent in from SNF for lethargy and high glucose level.  Pt cannot provide any history.  Per ED report, pt was found to have hyperkalemia with ekg changes which have both improved with ivf and treatment in the ED along with resolving aki.  Pt temp 93 F and with barehugger on.  Reported initially that cxr and ua were normal.  Pt was referred for admission for tele monitoring to ensure no further ekg changes and hyperkalemia remained resolved.      Assessment & Plan:   Principal Problem:   Sepsis ([name]) Active Problems:   Aortic valve sclerosis   Chronic lymphocytic leukemia ([name])   Essential (primary) hypertension   Peripheral vascular disease ([facility])   Type 2 diabetes mellitus with stage 3 chronic kidney disease (H[facility])   Anemia, pernicious   Multiple lacunar infarcts   Hypothermia   AKI (acute kidney injury) ([name])   Hyperkalemia   Acute metabolic encephalopathy   Dementia   HCAP (healthcare-associated pneumonia)   Acute pancreatitis without infection or necrosis  #1 probable sepsis likely secondary to HCAP Patient was admitted with concerns for sepsis as patient was noted to be hypothermic, chest x-ray abnormalities concerning for probable pneumonia and patient noted to have systolic blood pressures in the 90s, elevated lactic acid level, elevated creatinine/acute renal failure.  Patient currently off bear hugger and hypothermia seems to have resolved.  TSH within normal limits.  CT head negative for any acute abnormalities.  The abdomen and pelvis with no acute abnormalities.  Lactic acid level is trending down. Procalcitonin was less than 0.10.  Respiratory viral panel PCR negative.  MRSA PCR negative.  Blood cultures pending no growth to date.  Sputum  Gram stain and culture pending. DC'd vancomycin.  Continue Mucinex, duo nebs, Pulmicort.  Supportive care.  Assessed by speech therapy and placed on a dysphagia 2 diet.  Continue IV cefepime and start oral Augmentin today.  Follow.  2.  Acute renal failure on CKD III Likely secondary to prerenal azotemia.  Improved with hydration.  Saline lock IV fluids.  Follow.  3.  Hyperkalemia Resolved.  4.  Probable healthcare associated pneumonia Per chest x-ray.  Patient noted to be hypothermic which has since resolved.  Sputum Gram stain and cultures pending.  Blood cultures pending.  Respiratory viral panel by PCR is negative.  MRSA PCR negative.  Discontinue IV cefepime. D/C'd IV Vancomycin.  Start Augmentin.  Continue Pulmicort, Mucinex, duo nebs.  Likely transition to oral Augmentin tomorrow.  Follow.  5.  Hypertension Patient noted to have a significantly elevated blood pressure on admission.  Blood pressure improved on current regimen of Norvasc 10 mg daily and hydralazine 75 mg every 8 hours.  Saline lock IV fluids.  Continue current regimen and titrate as needed.  6.  Dementia Stable.  Continue Seroquel 25 mg nightly.  Discontinue morning dose Seroquel.   7.  Diabetes mellitus CBGs have ranged from 151 -339.  Hemoglobin A1c = 10.7.  Start Lantus 12 units daily.  Continue sliding scale insulin.  8.  Murmur Per family patient has had a murmur in the past and nothing new.  2D echo with EF 60-65 % with NWMA, grade 1 diastolic dysfunction, mild aortic valvular stenosis, calcified mitral  valvular annulus.  Outpatient follow-up.      DVT prophylaxis: Lovenox Code Status: Full Family Communication: Updated patient, no family at bedside. Disposition Plan: Likely skilled nursing facility once medically stable tomorrow.   Consultants:   None  Procedures:   CT head without contrast 04/10/2017  CT abdomen and pelvis 04/10/2017  CXR 04/10/2017  2D echo 04/11/2017.  Antimicrobials:    Vancomycin 04/11/2017>>>>>> 04/12/2017  IV cefepime 04/11/2017>>>>>> 04/14/2017  Augmentin 04/14/2017   Subjective: Eating breakfast.  Denies chest pain.  Denies shortness of breath.  States he is feeling better than on admission.    Objective: Vitals:   04/13/17 2100 04/14/17 0400 04/14/17 0526 04/14/17 0828  BP: (!) 161/59 (!) 166/62    Pulse: 84 88    Resp: 18 18    Temp: 99.1 F (37.3 C) 98.9 F (37.2 C)    TempSrc: Axillary Axillary    SpO2: 100% 100%  97%  Weight:   83.4 kg (183 lb 14.4 oz)   Height:        Intake/Output Summary (Last 24 hours) at 04/14/2017 1132 Last data filed at 04/14/2017 0529 Gross per 24 hour  Intake 1902.5 ml  Output 1500 ml  Net 402.5 ml   Filed Weights   04/10/17 1145 04/11/17 1500 04/14/17 0526  Weight: 59.4 kg (131 lb) 82.6 kg (182 lb) 83.4 kg (183 lb 14.4 oz)    Examination:  General exam: NAD Respiratory system: Clear to auscultation bilaterally.  No crackles, no rhonchi, no wheezing.  Respiratory effort normal. Cardiovascular system: Regular rate and rhythm with a 3/6 systolic ejection murmur. No JVD, murmurs, rubs, gallops or clicks. No pedal edema. Gastrointestinal system: Abdomen is, soft, nontender, nondistended, positive bowel sounds.  No hepatosplenomegaly. Central nervous system: Alert and oriented. No focal neurological deficits. Extremities: Symmetric 5 x 5 power. Skin: No rashes, lesions or ulcers Psychiatry: Judgement and insight appear normal. Mood & affect appropriate.     Data Reviewed: I have personally reviewed following labs and imaging studies  CBC: Recent Labs  Lab 04/10/17 2359 04/11/17 0533 04/12/17 0425 04/13/17 0815 04/14/17 0444  WBC 9.4 8.1 9.2 8.1 7.7  NEUTROABS  --  4.6 6.9  --   --   HGB 9.9* 10.9* 9.9* 9.4* 9.5*  HCT 30.7* 34.3* 31.1* 29.2* 29.8*  MCV 92.7 93.2 92.3 92.7 92.3  PLT 166 191 184 179 341   Basic Metabolic Panel: Recent Labs  Lab 04/10/17 1230  04/10/17 1500 04/10/17 2359  04/11/17 0533 04/12/17 0425 04/13/17 0815 04/14/17 0444  NA 133*   < > 144  --  141 137 140 140  K 6.3*   < > 3.6  --  4.9 4.7 4.1 4.0  CL 100*   < > 111  --  110 108 111 110  CO2 25  --   --   --  26 22 20* 22  GLUCOSE 448*   < > 267*  --  102* 186* 125* 146*  BUN 49*   < > 35*  --  29* 26* 22* 18  CREATININE 1.92*   < > 1.40* 1.44* 1.36* 1.49* 1.43* 1.32*  CALCIUM 10.2  --   --   --  9.8 9.2 9.0 8.9   < > = values in this interval not displayed.   GFR: Estimated Creatinine Clearance: 54.8 mL/min (A) (by C-G formula based on SCr of 1.32 mg/dL (H)). Liver Function Tests: Recent Labs  Lab 04/10/17 1230 04/11/17 0533  AST 28 18  ALT 12* 14*  ALKPHOS 111 109  BILITOT 1.1 0.6  PROT 8.1 7.3  ALBUMIN 4.2 3.8   Recent Labs  Lab 04/10/17 1230  LIPASE 312*   No results for input(s): AMMONIA in the last 168 hours. Coagulation Profile: No results for input(s): INR, PROTIME in the last 168 hours. Cardiac Enzymes: No results for input(s): CKTOTAL, CKMB, CKMBINDEX, TROPONINI in the last 168 hours. BNP (last 3 results) No results for input(s): PROBNP in the last 8760 hours. HbA1C: Recent Labs    04/14/17 0444  HGBA1C 10.7*   CBG: Recent Labs  Lab 04/13/17 0748 04/13/17 1234 04/13/17 1720 04/13/17 2114 04/14/17 0740  GLUCAP 109* 164* 239* 225* 151*   Lipid Profile: No results for input(s): CHOL, HDL, LDLCALC, TRIG, CHOLHDL, LDLDIRECT in the last 72 hours. Thyroid Function Tests: Recent Labs    04/11/17 1245  TSH 1.556   Anemia Panel: No results for input(s): VITAMINB12, FOLATE, FERRITIN, TIBC, IRON, RETICCTPCT in the last 72 hours. Sepsis Labs: Recent Labs  Lab 04/10/17 1231 04/10/17 1759 04/10/17 2359 04/11/17 0242  PROCALCITON  --  <0.10  --   --   LATICACIDVEN 1.27  --  2.1* 1.5    Recent Results (from the past 240 hour(s))  Culture, blood (routine x 2) Call MD if unable to obtain prior to antibiotics being given     Status: None (Preliminary result)     Collection Time: 04/10/17 11:44 PM  Result Value Ref Range Status   Specimen Description BLOOD LEFT ANTECUBITAL  Final   Special Requests IN PEDIATRIC BOTTLE Blood Culture adequate volume  Final   Culture   Final    NO GROWTH 2 DAYS Performed at [facility], [place] [address], [place], [place] [address]    Report Status PENDING  Incomplete  Culture, blood (routine x 2) Call MD if unable to obtain prior to antibiotics being given     Status: None (Preliminary result)   Collection Time: 04/10/17 11:59 PM  Result Value Ref Range Status   Specimen Description BLOOD LEFT ANTECUBITAL  Final   Special Requests IN PEDIATRIC BOTTLE Blood Culture adequate volume  Final   Culture   Final    NO GROWTH 2 DAYS Performed at [facility], [place] [address], [place], [place] [address]    Report Status PENDING  Incomplete  Respiratory Panel by PCR     Status: None   Collection Time: 04/10/17 11:59 PM  Result Value Ref Range Status   Adenovirus NOT DETECTED NOT DETECTED Final   Coronavirus 229E NOT DETECTED NOT DETECTED Final   Coronavirus HKU1 NOT DETECTED NOT DETECTED Final   Coronavirus NL63 NOT DETECTED NOT DETECTED Final   Coronavirus OC43 NOT DETECTED NOT DETECTED Final   Metapneumovirus NOT DETECTED NOT DETECTED Final   Rhinovirus / Enterovirus NOT DETECTED NOT DETECTED Final   Influenza A NOT DETECTED NOT DETECTED Final   Influenza B NOT DETECTED NOT DETECTED Final   Parainfluenza Virus 1 NOT DETECTED NOT DETECTED Final   Parainfluenza Virus 2 NOT DETECTED NOT DETECTED Final   Parainfluenza Virus 3 NOT DETECTED NOT DETECTED Final   Parainfluenza Virus 4 NOT DETECTED NOT DETECTED Final   Respiratory Syncytial Virus NOT DETECTED NOT DETECTED Final   Bordetella pertussis NOT DETECTED NOT DETECTED Final   Chlamydophila pneumoniae NOT DETECTED NOT DETECTED Final   Mycoplasma pneumoniae NOT DETECTED NOT DETECTED Final  MRSA PCR Screening     Status: None   Collection Time: 04/11/17  9:53 PM  Result Value Ref Range Status   MRSA by PCR NEGATIVE NEGATIVE Final    Comment:        The GeneXpert MRSA Assay (FDA approved for NASAL specimens only), is one component of a comprehensive MRSA colonization surveillance program. It is not intended to diagnose MRSA infection nor to guide or monitor treatment for MRSA infections.          Radiology Studies: No results found.      Scheduled Meds: . amLODipine  5 mg Oral Daily  . amoxicillin-clavulanate  1 tablet Oral Q12H  . budesonide (PULMICORT) nebulizer solution  0.5 mg Nebulization BID  . enoxaparin (LOVENOX) injection  40 mg Subcutaneous Q24H  . guaiFENesin  1,200 mg Oral BID  . hydrALAZINE  75 mg Oral Q8H  . insulin aspart  0-9 Units Subcutaneous TID AC & HS  . ipratropium-albuterol  3 mL Nebulization BID  . QUEtiapine  12.5 mg Oral BH-q7a  . QUEtiapine  25 mg Oral QHS   Continuous Infusions: . sodium chloride 75 mL/hr at 04/13/17 2124     LOS: 4 days    Time spent: 35 mins    Irine Seal, MD [facility] Pager [phone]  If 7PM-7AM, please contact night-coverage [email or web address] Password [email or web address] 04/14/2017, 11:32 AM

## 2017-04-15 DIAGNOSIS — N179 Acute kidney failure, unspecified: Secondary | ICD-10-CM | POA: Diagnosis not present

## 2017-04-15 DIAGNOSIS — F0391 Unspecified dementia with behavioral disturbance: Secondary | ICD-10-CM | POA: Diagnosis not present

## 2017-04-15 DIAGNOSIS — I10 Essential (primary) hypertension: Secondary | ICD-10-CM | POA: Diagnosis not present

## 2017-04-15 DIAGNOSIS — R6520 Severe sepsis without septic shock: Secondary | ICD-10-CM | POA: Diagnosis not present

## 2017-04-15 DIAGNOSIS — Z8673 Personal history of transient ischemic attack (TIA), and cerebral infarction without residual deficits: Secondary | ICD-10-CM | POA: Diagnosis not present

## 2017-04-15 DIAGNOSIS — R05 Cough: Secondary | ICD-10-CM | POA: Diagnosis not present

## 2017-04-15 DIAGNOSIS — E785 Hyperlipidemia, unspecified: Secondary | ICD-10-CM | POA: Diagnosis not present

## 2017-04-15 DIAGNOSIS — R1312 Dysphagia, oropharyngeal phase: Secondary | ICD-10-CM | POA: Diagnosis not present

## 2017-04-15 DIAGNOSIS — F0390 Unspecified dementia without behavioral disturbance: Secondary | ICD-10-CM | POA: Diagnosis not present

## 2017-04-15 DIAGNOSIS — F339 Major depressive disorder, recurrent, unspecified: Secondary | ICD-10-CM | POA: Diagnosis not present

## 2017-04-15 DIAGNOSIS — T68XXXD Hypothermia, subsequent encounter: Secondary | ICD-10-CM | POA: Diagnosis not present

## 2017-04-15 DIAGNOSIS — R0981 Nasal congestion: Secondary | ICD-10-CM | POA: Diagnosis not present

## 2017-04-15 DIAGNOSIS — E119 Type 2 diabetes mellitus without complications: Secondary | ICD-10-CM | POA: Diagnosis not present

## 2017-04-15 DIAGNOSIS — A419 Sepsis, unspecified organism: Secondary | ICD-10-CM | POA: Diagnosis not present

## 2017-04-15 DIAGNOSIS — E1122 Type 2 diabetes mellitus with diabetic chronic kidney disease: Secondary | ICD-10-CM | POA: Diagnosis not present

## 2017-04-15 DIAGNOSIS — W19XXXA Unspecified fall, initial encounter: Secondary | ICD-10-CM | POA: Diagnosis not present

## 2017-04-15 DIAGNOSIS — M6281 Muscle weakness (generalized): Secondary | ICD-10-CM | POA: Diagnosis not present

## 2017-04-15 DIAGNOSIS — J189 Pneumonia, unspecified organism: Secondary | ICD-10-CM | POA: Diagnosis not present

## 2017-04-15 DIAGNOSIS — E875 Hyperkalemia: Secondary | ICD-10-CM

## 2017-04-15 DIAGNOSIS — R41841 Cognitive communication deficit: Secondary | ICD-10-CM | POA: Diagnosis not present

## 2017-04-15 DIAGNOSIS — I739 Peripheral vascular disease, unspecified: Secondary | ICD-10-CM | POA: Diagnosis not present

## 2017-04-15 DIAGNOSIS — N183 Chronic kidney disease, stage 3 (moderate): Secondary | ICD-10-CM | POA: Diagnosis not present

## 2017-04-15 DIAGNOSIS — R4182 Altered mental status, unspecified: Secondary | ICD-10-CM | POA: Diagnosis not present

## 2017-04-15 DIAGNOSIS — F324 Major depressive disorder, single episode, in partial remission: Secondary | ICD-10-CM | POA: Diagnosis not present

## 2017-04-15 DIAGNOSIS — R410 Disorientation, unspecified: Secondary | ICD-10-CM | POA: Diagnosis not present

## 2017-04-15 DIAGNOSIS — C9110 Chronic lymphocytic leukemia of B-cell type not having achieved remission: Secondary | ICD-10-CM | POA: Diagnosis not present

## 2017-04-15 LAB — BASIC METABOLIC PANEL
Anion gap: 10 (ref 5–15)
BUN: 15 mg/dL (ref 6–20)
CALCIUM: 9.2 mg/dL (ref 8.9–10.3)
CHLORIDE: 109 mmol/L (ref 101–111)
CO2: 21 mmol/L — AB (ref 22–32)
Creatinine, Ser: 1.19 mg/dL (ref 0.61–1.24)
GFR calc non Af Amer: >60 mL/min (ref >60)
GLUCOSE: 104 mg/dL — AB (ref 65–99)
Potassium: 3.7 mmol/L (ref 3.5–5.1)
Sodium: 140 mmol/L (ref 135–145)

## 2017-04-15 LAB — CBC
HEMATOCRIT: 33.1 % — AB (ref 39.0–52.0)
HEMOGLOBIN: 10.6 g/dL — AB (ref 13.0–17.0)
MCH: 29.6 pg (ref 26.0–34.0)
MCHC: 32 g/dL (ref 30.0–36.0)
MCV: 92.5 fL (ref 78.0–100.0)
Platelets: 175 10*3/uL (ref 150–400)
RBC: 3.58 MIL/uL — ABNORMAL LOW (ref 4.22–5.81)
RDW: 13.6 % (ref 11.5–15.5)
WBC: 8.9 10*3/uL (ref 4.0–10.5)

## 2017-04-15 LAB — GLUCOSE, CAPILLARY
GLUCOSE-CAPILLARY: 170 mg/dL — AB (ref 65–99)
Glucose-Capillary: 107 mg/dL — ABNORMAL HIGH (ref 65–99)
Glucose-Capillary: 224 mg/dL — ABNORMAL HIGH (ref 65–99)

## 2017-04-15 MED ORDER — HYDRALAZINE HCL 50 MG PO TABS: 75.0000 mg | ORAL_TABLET | Freq: Three times a day (TID) | ORAL | 0 refills | Status: DC

## 2017-04-15 MED ORDER — GUAIFENESIN ER 600 MG PO TB12: 1200.0000 mg | ORAL_TABLET | Freq: Two times a day (BID) | ORAL | 0 refills | Status: AC

## 2017-04-15 MED ORDER — QUETIAPINE FUMARATE 25 MG PO TABS: 25.0000 mg | ORAL_TABLET | Freq: Every day | ORAL | 0 refills | Status: DC

## 2017-04-15 MED ORDER — ENOXAPARIN SODIUM 40 MG/0.4ML ~~LOC~~ SOLN: 40.0000 mg | SUBCUTANEOUS | Status: DC

## 2017-04-15 MED ORDER — IPRATROPIUM-ALBUTEROL 0.5-2.5 (3) MG/3ML IN SOLN: 3.0000 mL | RESPIRATORY_TRACT | 0 refills | Status: DC | PRN

## 2017-04-15 MED ORDER — GABAPENTIN 300 MG PO CAPS
300.0000 mg | ORAL_CAPSULE | Freq: Three times a day (TID) | ORAL | Status: DC
  Administered 2017-04-15: 300 mg via ORAL
  Filled 2017-04-15 (×2): qty 1

## 2017-04-15 MED ORDER — INSULIN GLARGINE 100 UNIT/ML ~~LOC~~ SOLN: 12.0000 [IU] | Freq: Every day | SUBCUTANEOUS | 0 refills | Status: DC

## 2017-04-15 MED ORDER — AMLODIPINE BESYLATE 10 MG PO TABS: 10.0000 mg | ORAL_TABLET | Freq: Every day | ORAL | 0 refills | Status: DC

## 2017-04-15 MED ORDER — INSULIN ASPART 100 UNIT/ML ~~LOC~~ SOLN: 0.0000 [IU] | Freq: Three times a day (TID) | SUBCUTANEOUS | 11 refills | Status: DC

## 2017-04-15 MED ORDER — PAROXETINE HCL 10 MG PO TABS
10.0000 mg | ORAL_TABLET | Freq: Every day | ORAL | Status: DC
  Filled 2017-04-15: qty 1

## 2017-04-15 MED ORDER — AMOXICILLIN-POT CLAVULANATE 875-125 MG PO TABS: 1.0000 | ORAL_TABLET | Freq: Two times a day (BID) | ORAL | 0 refills | Status: AC

## 2017-04-15 NOTE — Progress Notes (Signed)
Physical Therapy Treatment Patient Details Name: Manuel NOAH Sr. MRN: [id number] DOB: 1946/03/21 Today's Date: 04/15/2017    History of Present Illness 72 yo male admitted with sepsis, pna. Hx of dementia.     PT Comments    Progressing slowly with mobility. Pt more participatory on today. He continues to require Mod assist for mobility. Continue to recommend SNF.    Follow Up Recommendations  SNF     Equipment Recommendations  None recommended by PT    Recommendations for Other Services       Precautions / Restrictions Precautions Precautions: Fall Restrictions Weight Bearing Restrictions: No    Mobility  Bed Mobility Overal bed mobility: Needs Assistance Bed Mobility: Supine to Sit;Sit to Supine     Supine to sit: Mod assist;HOB elevated Sit to supine: Min assist   General bed mobility comments: Increased assist to complete tasks. Pt would initiate with increased time and repeated multimodal cueing. Assist for trunk and LEs. Utilized bedpad to aid with scooting, positioning.   Transfers Overall transfer level: Needs assistance Equipment used: Rolling walker (2 wheeled) Transfers: Sit to/from Stand Sit to Stand: Mod assist;From elevated surface         General transfer comment: Assist to rise, stabilize, control descent. Posterior bias. Multimodal cueing and increased time required.   Ambulation/Gait     Mod Assist Gait Pattern/deviations: Step-to pattern     General Gait Details: 5-6 lateral steps toward HOB with RW. Increased time and repeated multimodal cueing required.    Stairs            Wheelchair Mobility    Modified Rankin (Stroke Patients Only)       Balance Overall balance assessment: Needs assistance   Sitting balance-Leahy Scale: Fair Sitting balance - Comments: Pt tended to prop/support himself with his L UE.  Postural control: Posterior lean Standing balance support: Bilateral upper extremity supported Standing  balance-Leahy Scale: Poor                              Cognition Arousal/Alertness: Awake/alert Behavior During Therapy: WFL for tasks assessed/performed Overall Cognitive Status: History of cognitive impairments - at baseline                                        Exercises      General Comments        Pertinent Vitals/Pain Pain Assessment: Faces Faces Pain Scale: No hurt    Home Living                      Prior Function            PT Goals (current goals can now be found in the care plan section) Progress towards PT goals: Progressing toward goals    Frequency    Min 2X/week      PT Plan      Co-evaluation              AM-PAC PT "6 Clicks" Daily Activity  Outcome Measure  Difficulty turning over in bed (including adjusting bedclothes, sheets and blankets)?: Unable Difficulty moving from lying on back to sitting on the side of the bed? : Unable Difficulty sitting down on and standing up from a chair with arms (e.g., wheelchair, bedside commode, etc,.)?: Unable Help needed moving to and from  a bed to chair (including a wheelchair)?: A Lot Help needed walking in hospital room?: A Lot Help needed climbing 3-5 steps with a railing? : Total 6 Click Score: 8    End of Session Equipment Utilized During Treatment: Gait belt Activity Tolerance: Patient tolerated treatment well Patient left: in bed;with call bell/phone within reach;with bed alarm set   PT Visit Diagnosis: Muscle weakness (generalized) (M62.81);Difficulty in walking, not elsewhere classified (R26.2)     Time: 8088-1103 PT Time Calculation (min) (ACUTE ONLY): 13 min  Charges:  $Therapeutic Activity: 8-22 mins                    G Codes:          Weston Anna, MPT Pager: [phone]

## 2017-04-15 NOTE — Discharge Summary (Signed)
Physician Discharge Summary  Manuel DITTER Sr. HBZ:[id number] DOB: Mar 25, 1946 DOA: 04/10/2017  PCP: [name], MD  Admit date: 04/10/2017 Discharge date: 04/15/2017  Time spent: 60 minutes  Recommendations for Outpatient Follow-up:  1. Follow-up with MD at skilled nursing facility.  Patient will need a basic metabolic profile done in 1 week to follow-up on electrolytes and renal function.  Patient's blood pressure need to be reassessed and medications uptitrated if needed.  Patient's hemoglobin A1c was 10.7 and as such patient started on Lantus and sliding scale insulin in addition to his metformin on discharge.  Outpatient follow-up.   Discharge Diagnoses:  Principal Problem:   Sepsis ([place]) Active Problems:   Aortic valve sclerosis   Chronic lymphocytic leukemia ([place])   Essential (primary) hypertension   Peripheral vascular disease (H[facility])   Type 2 diabetes mellitus with stage 3 chronic kidney disease (HCC)   Anemia, pernicious   Multiple lacunar infarcts   Hypothermia   AKI (acute kidney injury) ([place])   Hyperkalemia   Acute metabolic encephalopathy   Dementia   HCAP (healthcare-associated pneumonia)   Acute pancreatitis without infection or necrosis   Discharge Condition: Stable and improved.  Diet recommendation: Dysphagia 2 diet with thin liquids, aspiration precautions/carb modified.  Filed Weights   04/10/17 1145 04/11/17 1500 04/14/17 0526  Weight: 59.4 kg (131 lb) 82.6 kg (182 lb) 83.4 kg (183 lb 14.4 oz)    History of present illness:  Per Dr Manuel Lean Sr. is a 72 y.o. male with medical history significant of dementia, dm, htn, [name] sent in from SNF for lethargy and high glucose level.  Pt cannot provide any history.  Per ED report, pt was found to have hyperkalemia with ekg changes which have both improved with ivf and treatment in the ED along with resolving aki.  Pt temp 93 F and with barehugger on.  Reported initially that cxr and ua were  normal.  Pt was referred for admission for tele monitoring to ensure no further ekg changes and hyperkalemia remained resolved    Hospital Course:  #1 probable sepsis likely secondary to HCAP Patient was admitted with concerns for sepsis as patient was noted to be hypothermic, chest x-ray abnormalities concerning for probable pneumonia and patient noted to have systolic blood pressures in the 90s, elevated lactic acid level, elevated creatinine/acute renal failure.  Patient currently off bear hugger and hypothermia seems to have resolved.  TSH within normal limits.  CT head negative for any acute abnormalities.  The abdomen and pelvis with no acute abnormalities.  Lactic acid level trended down. Procalcitonin was less than 0.10.  Respiratory viral panel PCR negative.  MRSA PCR negative.  Blood cultures pending no growth to date.  Sputum Gram stain and culture pending no growth to date.  Patient was empirically started on IV vancomycin and IV cefepime, Mucinex, duo nebs, Pulmicort and supportive care.  Patient improved clinically during the hospitalization.  Assessed by speech therapy and placed on a dysphagia 2 diet aspiration precautions.  Patient's IV cefepime and IV vancomycin was subsequently transitioned to oral Augmentin.  Patient will be discharged home on 4 more days of oral Augmentin to complete a course of antibiotic treatment.  2.  Acute renal failure on CKD III Likely secondary to prerenal azotemia.  Improved with hydration.    3.  Hyperkalemia Resolved.  4.  Probable healthcare associated pneumonia Per chest x-ray.  Patient noted to be hypothermic on admission which has since resolved.  Sputum  Gram stain and cultures pending with no growth to date.  Blood cultures were pending with no growth to date times 4 days.Respiratory viral panel by PCR is negative.  MRSA PCR negative.  Initially placed empirically on IV vancomycin and IV cefepime.  Pulmicort and Mucinex and duo nebs were added.   Patient improved clinically and was transitioned to oral Augmentin.  Patient was discharged on 4 more days of oral Augmentin to complete a one-week course of antibiotic treatment.  Outpatient follow-up. Discontinue IV cefepime. D/C'd IV Vancomycin.  Start Augmentin.  Continue Pulmicort, Mucinex, duo nebs.  Likely transition to oral Augmentin tomorrow.  Follow.  5.  Hypertension Patient noted to have a significantly elevated blood pressure on admission.  Patient's ACE inhibitor and HCTZ were discontinued on admission due to acute on chronic kidney disease.  Patient was subsequently started on Norvasc and dose increased to 10 mg daily.  Hydralazine dose was also adjusted and increased to 75 mg every 8 hours.  Patient will follow-up with MD at skilled nursing facility.  Hydralazine may further be uptitrated if needed.  6.  Dementia Stable.    Patient's home dose Seroquel was increased from 12.5 mg to 25 mg nightly.  Patient remained stable.  Did not have any further agitation.   7.  Diabetes mellitus Hemoglobin A1c = 10.7.  Started on Lantus 12 units daily and sliding scale insulin with better blood glucose control.  Patient will be discharged on Lantus and sliding scale insulin as well as his home regimen of metformin.  Outpatient follow-up.  8.  Murmur Per family patient has had a murmur in the past and nothing new.  2D echo with EF 60-65 % with NWMA, grade 1 diastolic dysfunction, mild aortic valvular stenosis, calcified mitral valvular annulus.  Outpatient follow-up.       Procedures:  CT head without contrast 04/10/2017  CT abdomen and pelvis 04/10/2017  CXR 04/10/2017  2D echo 04/11/2017.      Consultations:  None  Discharge Exam: Vitals:   04/15/17 0955 04/15/17 1335  BP:  (!) 156/49  Pulse:  78  Resp:    Temp:  98.6 F (37 C)  SpO2: 97% 98%    General: NAD Cardiovascular: RRR Respiratory: CTAB  Discharge Instructions   Discharge Instructions    Diet Carb  Modified   Complete by:  As directed    Dysphagia 2 diet with thin liquids, aspiration precautions.   Increase activity slowly   Complete by:  As directed      Allergies as of 04/15/2017   No Known Allergies     Medication List    STOP taking these medications   lisinopril 40 MG tablet Commonly known as:  PRINIVIL,ZESTRIL     TAKE these medications   acetaminophen 500 MG tablet Commonly known as:  TYLENOL Take 500 mg by mouth every 4 (four) hours as needed for mild pain, fever or headache. NOT TO EXCEED 2000 MG IN 24 HOURS   amLODipine 10 MG tablet Commonly known as:  NORVASC Take 1 tablet (10 mg total) by mouth daily. What changed:    medication strength  how much to take   amoxicillin-clavulanate 875-125 MG tablet Commonly known as:  AUGMENTIN Take 1 tablet by mouth every 12 (twelve) hours for 4 days.   aspirin 81 MG tablet Take 1 tablet (81 mg total) by mouth daily.   enoxaparin 40 MG/0.4ML injection Commonly known as:  LOVENOX Inject 0.4 mLs (40 mg total) into the skin  daily.   feeding supplement (PRO-STAT SUGAR FREE 64) Liqd Take 30 mLs by mouth 3 (three) times daily with meals.   gabapentin 300 MG capsule Commonly known as:  NEURONTIN TAKE 1 CAPSULE BY MOUTH 3  TIMES DAILY What changed:    how much to take  how to take this  when to take this   glucose blood test strip Use as instructed   guaifenesin 100 MG/5ML syrup Commonly known as:  ROBITUSSIN Take 200 mg by mouth 4 (four) times daily as needed for cough. NOT TO EXCEED 4 DOSES IN 24 HOURS What changed:  Another medication with the same name was added. Make sure you understand how and when to take each.   guaiFENesin 600 MG 12 hr tablet Commonly known as:  MUCINEX Take 2 tablets (1,200 mg total) by mouth 2 (two) times daily for 4 days. What changed:  You were already taking a medication with the same name, and this prescription was added. Make sure you understand how and when to take each.    hydrALAZINE 50 MG tablet Commonly known as:  APRESOLINE Take 1.5 tablets (75 mg total) by mouth every 8 (eight) hours. What changed:  how much to take   insulin aspart 100 UNIT/ML injection Commonly known as:  novoLOG Inject 0-9 Units into the skin 4 (four) times daily -  before meals and at bedtime.   insulin glargine 100 UNIT/ML injection Commonly known as:  LANTUS Inject 0.12 mLs (12 Units total) into the skin daily. Start taking on:  04/16/2017   ipratropium-albuterol 0.5-2.5 (3) MG/3ML Soln Commonly known as:  DUONEB Take 3 mLs by nebulization every 2 (two) hours as needed.   Lancets 28G Misc 1 each by Does not apply route daily.   loperamide 2 MG capsule Commonly known as:  IMODIUM Take 2 mg by mouth as needed for diarrhea or loose stools. DO NOT EXCEED 8 DOSES IN 24 HOURS   magnesium hydroxide 400 MG/5ML suspension Commonly known as:  MILK OF MAGNESIA Take 30 mLs by mouth at bedtime as needed for mild constipation or moderate constipation.   metFORMIN 500 MG tablet Commonly known as:  GLUCOPHAGE Take 1 tablet (500 mg total) by mouth 2 (two) times daily with a meal. What changed:  how much to take   [place] 200-200-20 MG/5ML suspension Generic drug:  alum & mag hydroxide-simeth Take 30 mLs by mouth every 6 (six) hours as needed for indigestion or heartburn. NOT TO EXCEED 4 DOSES IN 24 HOURS   multivitamin with minerals Tabs tablet Take 1 tablet by mouth daily.   mupirocin ointment 2 % Commonly known as:  BACTROBAN Apply to affected area 3 times daily What changed:    how much to take  how to take this  when to take this  additional instructions   neomycin-bacitracin-polymyxin ointment Commonly known as:  NEOSPORIN Apply 1 application topically every 8 (eight) hours as needed for wound care.   PARoxetine 10 MG tablet Commonly known as:  PAXIL Take 10 mg by mouth at bedtime.   QUEtiapine 25 MG tablet Commonly known as:  SEROQUEL Take 1 tablet (25 mg  total) by mouth at bedtime. What changed:  how much to take   simvastatin 20 MG tablet Commonly known as:  ZOCOR TAKE 1 TABLET BY MOUTH AT  BEDTIME What changed:    how much to take  how to take this  when to take this   triamcinolone cream 0.1 % Commonly known as:  KENALOG APPLY TO AFFECTED AREA TWICE A DAY What changed:    how much to take  how to take this  when to take this  additional instructions   vitamin C 250 MG tablet Commonly known as:  ASCORBIC ACID Take 500 mg by mouth 2 (two) times daily.   zinc sulfate 220 (50 Zn) MG capsule Take 220 mg by mouth daily.      No Known Allergies Follow-up Information    MD AT SNF Follow up.            The results of significant diagnostics from this hospitalization (including imaging, microbiology, ancillary and laboratory) are listed below for reference.    Significant Diagnostic Studies: Ct Abdomen Pelvis Wo Contrast  Result Date: 04/10/2017 CLINICAL DATA:  72 year old male with history of generalized weakness and hyperglycemia. EXAM: CT ABDOMEN AND PELVIS WITHOUT CONTRAST TECHNIQUE: Multidetector CT imaging of the abdomen and pelvis was performed following the standard protocol without IV contrast. COMPARISON:  No priors. FINDINGS: Lower chest: Extensive areas of septal thickening, thickening of the peribronchovascular interstitium, regional areas of architectural distortion and patchy areas of cylindrical bronchiectasis are noted throughout the lung bases bilaterally. A few scattered pulmonary nodules are noted in the lung bases measuring up to 6 mm in the left lower lobe and 7 mm in the periphery of the right middle lobe. Calcifications of the aortic valve and mitral annulus. Aortic atherosclerosis. Atherosclerotic calcifications in the left anterior descending and left circumflex coronary arteries. Hepatobiliary: No definite cystic or solid hepatic lesions are confidently identified on today's noncontrast CT  examination. Numerous tiny calcified gallstones lie dependently in the gallbladder. No findings to suggest an associated cholecystitis. Pancreas: No definite pancreatic mass or peripancreatic inflammatory changes are noted on today's noncontrast CT examination. Spleen: Unremarkable. Adrenals/Urinary Tract: Multiple calcifications in the kidneys bilaterally appear to be vascular. 12 mm exophytic intermediate attenuation (41 HU) lesion extending off the posterior aspect of the lower pole of the left kidney, incompletely characterized on today's noncontrast CT examination, but statistically likely a small proteinaceous cyst. No hydroureteronephrosis. Urinary bladder is normal in appearance. Bilateral adrenal glands are normal in appearance. Stomach/Bowel: Normal appearance of the stomach. No pathologic dilatation of small bowel or colon. Normal appendix. Vascular/Lymphatic: Aortic atherosclerosis, without definite aneurysm in the abdominal or pelvic vasculature. No lymphadenopathy noted in the abdomen or pelvis. Reproductive: Prostate gland and seminal vesicles are unremarkable in appearance. Postoperative changes of penile implant noted, with reservoir in the lower right hemipelvis. Other: No significant volume of ascites.  No pneumoperitoneum. Musculoskeletal: There are no aggressive appearing lytic or blastic lesions noted in the visualized portions of the skeleton. Chronic appearing compression fracture of T12 with approximately 40% loss of anterior vertebral body height. IMPRESSION: 1. No acute findings noted in the abdomen or pelvis to account for the patient's symptoms. 2. Cholelithiasis without evidence of acute cholecystitis at this time. 3. Aortic atherosclerosis, in addition to at least 2 vessel coronary artery disease. Assessment for potential risk factor modification, dietary therapy or pharmacologic therapy may be warranted, if clinically indicated. 4. There are calcifications of the aortic valve and  mitral annulus. Echocardiographic correlation for evaluation of potential valvular dysfunction may be warranted if clinically indicated. 5. Additional incidental findings, as above. Aortic Atherosclerosis (ICD10-I70.0). Electronically Signed   By: Vinnie Langton M.D.   On: 04/10/2017 14:52   Dg Chest 2 View  Result Date: 04/12/2017 CLINICAL DATA:  Sepsis EXAM: CHEST  2 VIEW COMPARISON:  04/10/2017 FINDINGS: Mild  cardiomegaly. Left base atelectasis or infiltrate. Right lung is clear. No effusions or acute bony abnormality. IMPRESSION: Left base atelectasis or infiltrate. Electronically Signed   By: Rolm Baptise M.D.   On: 04/12/2017 12:05   Dg Chest 2 View  Result Date: 04/10/2017 CLINICAL DATA:  Hypothermia EXAM: CHEST  2 VIEW COMPARISON:  11/13/2016 chest radiograph. FINDINGS: Slightly low lung volumes. Stable cardiomediastinal silhouette with normal heart size. No pneumothorax. No pleural effusion. No pulmonary edema. Hazy and curvilinear bibasilar lung opacities. IMPRESSION: Hazy and curvilinear bibasilar lung opacities with slightly low lung volumes. Favor atelectasis, difficult to exclude aspiration or developing pneumonia. Short-term follow-up PA and lateral chest radiographs advised. Electronically Signed   By: Ilona Sorrel M.D.   On: 04/10/2017 12:47   Ct Head Wo Contrast  Result Date: 04/10/2017 CLINICAL DATA:  Altered mental status with generalized weakness and hyperglycemia. EXAM: CT HEAD WITHOUT CONTRAST TECHNIQUE: Contiguous axial images were obtained from the base of the skull through the vertex without intravenous contrast. COMPARISON:  11/13/2016 FINDINGS: Brain: Examination demonstrates minimal age related atrophic change which is stable. Ventricles and cisterns are otherwise unchanged. There is chronic ischemic microvascular disease present. Old lacunar infarct over the left lentiform nucleus. Small old infarct in the right periventricular white matter and right basal ganglia. No  evidence of focal mass, mass effect, shift of midline structures or acute hemorrhage. Vascular: No hyperdense vessel or unexpected calcification. Skull: Normal. Negative for fracture or focal lesion. Sinuses/Orbits: Orbits are within normal. Paranasal sinuses are well developed as there is mild mucosal membrane thickening/opacification of on the ethmoid sinus and right maxillary sinus with small air-fluid level over the left maxillary sinus. Mastoid air cells are clear. Other: Prominence of the parotid glands right worse than left which are partially visualized. No definite mass or adenopathy. IMPRESSION: No acute findings. Mild atrophy with chronic ischemic microvascular disease. Several small old central infarcts as described. Sinus inflammatory change as described. Electronically Signed   By: Marin Olp M.D.   On: 04/10/2017 17:33    Microbiology: Recent Results (from the past 240 hour(s))  Culture, blood (routine x 2) Call MD if unable to obtain prior to antibiotics being given     Status: None (Preliminary result)   Collection Time: 04/10/17 11:44 PM  Result Value Ref Range Status   Specimen Description BLOOD LEFT ANTECUBITAL  Final   Special Requests IN PEDIATRIC BOTTLE Blood Culture adequate volume  Final   Culture   Final    NO GROWTH 4 DAYS Performed at [facility], [address] [place], [place] [address]    Report Status PENDING  Incomplete  Culture, blood (routine x 2) Call MD if unable to obtain prior to antibiotics being given     Status: None (Preliminary result)   Collection Time: 04/10/17 11:59 PM  Result Value Ref Range Status   Specimen Description BLOOD LEFT ANTECUBITAL  Final   Special Requests IN PEDIATRIC BOTTLE Blood Culture adequate volume  Final   Culture   Final    NO GROWTH 4 DAYS Performed at [facility], [place] [address], [place], [place] [address]    Report Status PENDING  Incomplete  Respiratory Panel by PCR     Status: None   Collection  Time: 04/10/17 11:59 PM  Result Value Ref Range Status   Adenovirus NOT DETECTED NOT DETECTED Final   Coronavirus 229E NOT DETECTED NOT DETECTED Final   Coronavirus HKU1 NOT DETECTED NOT DETECTED Final  Coronavirus NL63 NOT DETECTED NOT DETECTED Final   Coronavirus OC43 NOT DETECTED NOT DETECTED Final   Metapneumovirus NOT DETECTED NOT DETECTED Final   Rhinovirus / Enterovirus NOT DETECTED NOT DETECTED Final   Influenza A NOT DETECTED NOT DETECTED Final   Influenza B NOT DETECTED NOT DETECTED Final   Parainfluenza Virus 1 NOT DETECTED NOT DETECTED Final   Parainfluenza Virus 2 NOT DETECTED NOT DETECTED Final   Parainfluenza Virus 3 NOT DETECTED NOT DETECTED Final   Parainfluenza Virus 4 NOT DETECTED NOT DETECTED Final   Respiratory Syncytial Virus NOT DETECTED NOT DETECTED Final   Bordetella pertussis NOT DETECTED NOT DETECTED Final   Chlamydophila pneumoniae NOT DETECTED NOT DETECTED Final   Mycoplasma pneumoniae NOT DETECTED NOT DETECTED Final  MRSA PCR Screening     Status: None   Collection Time: 04/11/17  9:53 PM  Result Value Ref Range Status   MRSA by PCR NEGATIVE NEGATIVE Final    Comment:        The GeneXpert MRSA Assay (FDA approved for NASAL specimens only), is one component of a comprehensive MRSA colonization surveillance program. It is not intended to diagnose MRSA infection nor to guide or monitor treatment for MRSA infections.      Labs: Basic Metabolic Panel: Recent Labs  Lab 04/11/17 0533 04/12/17 0425 04/13/17 0815 04/14/17 0444 04/15/17 0509  NA 141 137 140 140 140  K 4.9 4.7 4.1 4.0 3.7  CL 110 108 111 110 109  CO2 26 22 20* 22 21*  GLUCOSE 102* 186* 125* 146* 104*  BUN 29* 26* 22* 18 15  CREATININE 1.36* 1.49* 1.43* 1.32* 1.19  CALCIUM 9.8 9.2 9.0 8.9 9.2   Liver Function Tests: Recent Labs  Lab 04/10/17 1230 04/11/17 0533  AST 28 18  ALT 12* 14*  ALKPHOS 111 109  BILITOT 1.1 0.6  PROT 8.1 7.3  ALBUMIN 4.2 3.8   Recent Labs   Lab 04/10/17 1230  LIPASE 312*   No results for input(s): AMMONIA in the last 168 hours. CBC: Recent Labs  Lab 04/11/17 0533 04/12/17 0425 04/13/17 0815 04/14/17 0444 04/15/17 0509  WBC 8.1 9.2 8.1 7.7 8.9  NEUTROABS 4.6 6.9  --   --   --   HGB 10.9* 9.9* 9.4* 9.5* 10.6*  HCT 34.3* 31.1* 29.2* 29.8* 33.1*  MCV 93.2 92.3 92.7 92.3 92.5  PLT 191 184 179 167 175   Cardiac Enzymes: No results for input(s): CKTOTAL, CKMB, CKMBINDEX, TROPONINI in the last 168 hours. BNP: BNP (last 3 results) No results for input(s): BNP in the last 8760 hours.  ProBNP (last 3 results) No results for input(s): PROBNP in the last 8760 hours.  CBG: Recent Labs  Lab 04/14/17 1203 04/14/17 1650 04/14/17 2110 04/15/17 0749 04/15/17 1332  GLUCAP 339* 134* 86 107* 224*       Signed:  Irine Seal MD.  [facility] 04/15/2017, 2:43 PM

## 2017-04-15 NOTE — Progress Notes (Signed)
Attempted to call report on patient to facility.  Placed on hold for 5 min, then phone rang continuously.

## 2017-04-15 NOTE — Progress Notes (Signed)
  Speech Language Pathology Treatment: Dysphagia  Patient Details Name: Manuel STUCK Sr. MRN: [id number] DOB: 06-Nov-1945 Today's Date: 04/15/2017 Time: 2119-4174 SLP Time Calculation (min) (ACUTE ONLY): 25 min  Assessment / Plan / Recommendation Clinical Impression  Patient presents with delayed processing of information and subsequently delayed swallow *suspect oral delay.  He was able to self feed however and consumed ALL of his magic cup = pt declined to consume grits or eggs stating "I don't like them".  Graham crackers provided as they dissolve and pt tolerated them well.  Subtle cough with coffee noted - likely due to premature spillage into larynx.  Cues to swallow were not effective in timely fashion due to delayed processing.  Pt then admitted to occasional issues with swallowing prior to admit.  Recommend continue diet - encouraging pt to self feed.  Concern for adequacy of intake present.  Will follow up for family education.  Chronic risk of aspiration present due to pt's dysphagia/mentation.     HPI HPI: Manuel [name] a 72 y.o.malewith medical history significant ofdementia, dm, htn, [name] sent in from SNF for lethargy and high glucose level. Dx with sepsis. CXR concerning for left sided PNA. MD noted patient coughing with pos this am raising concern for possible aspiration PNA. Evaluated by SLP at bedside during 10/2016 admission and patient with oral dysphagia but recommendations for regular solids, thin liquid.       SLP Plan  Continue with current plan of care       Recommendations  Diet recommendations: Dysphagia 2 (fine chop);Thin liquid Liquids provided via: Cup;Straw Medication Administration: Crushed with puree Supervision: Staff to assist with self feeding;Full supervision/cueing for compensatory strategies Compensations: Slow rate;Small sips/bites Postural Changes and/or Swallow Maneuvers: Seated upright 90 degrees                Oral Care  Recommendations: Oral care BID Follow up Recommendations: (TBD) SLP Visit Diagnosis: Dysphagia, oropharyngeal phase (R13.12) Plan: Continue with current plan of care       GO                Macario Golds 04/15/2017, 9:37 AM Luanna Salk, [place] [facility] SLP [phone]

## 2017-04-15 NOTE — Clinical Social Work Placement (Signed)
Patient received and accepted bed offer at [facility]. Facility aware of patient's discharge and confirmed bed offer. PTAR contacted, patient's family notified. Patient's RN can call report to [phone] Room 115, packet complete. CSW signing off, no other needs identified at this time.  CLINICAL SOCIAL WORK PLACEMENT  NOTE  Date:  04/15/2017  Patient Details  Name: Manuel Randolph Sr. MRN: [id number] Date of Birth: 1945-10-24  Clinical Social Work is seeking post-discharge placement for this patient at the [place] level of care (*CSW will initial, date and re-position this form in  chart as items are completed):  Yes   Patient/family provided with [facility] Work Department's list of facilities offering this level of care within the geographic area requested by the patient (or if unable, by the patient's family).  Yes   Patient/family informed of their freedom to choose among providers that offer the needed level of care, that participate in Medicare, Medicaid or managed care program needed by the patient, have an available bed and are willing to accept the patient.  Yes   Patient/family informed of [facility]'s ownership interest in [facility] and [facility], as well as of the fact that they are under no obligation to receive care at these facilities.  PASRR submitted to EDS on 04/14/17     PASRR number received on 04/14/17     Existing PASRR number confirmed on       FL2 transmitted to all facilities in geographic area requested by pt/family on 04/14/17     FL2 transmitted to all facilities within larger geographic area on       Patient informed that his/her managed care company has contracts with or will negotiate with certain facilities, including the following:        Yes   Patient/family informed of bed offers received.  Patient chooses bed at [facility]     Physician recommends and patient chooses bed  at      Patient to be transferred to [facility] on 04/15/17.  Patient to be transferred to facility by PTAR     Patient family notified on 04/15/17 of transfer.  Name of family member notified:  [name]     PHYSICIAN       Additional Comment:    _______________________________________________ Burnis Medin, LCSW 04/15/2017, 4:20 PM

## 2017-04-16 LAB — CULTURE, BLOOD (ROUTINE X 2)
CULTURE: NO GROWTH
Culture: NO GROWTH
Special Requests: ADEQUATE
Special Requests: ADEQUATE

## 2017-04-18 DIAGNOSIS — F324 Major depressive disorder, single episode, in partial remission: Secondary | ICD-10-CM | POA: Diagnosis not present

## 2017-04-18 DIAGNOSIS — E119 Type 2 diabetes mellitus without complications: Secondary | ICD-10-CM | POA: Diagnosis not present

## 2017-04-18 DIAGNOSIS — J189 Pneumonia, unspecified organism: Secondary | ICD-10-CM | POA: Diagnosis not present

## 2017-04-18 DIAGNOSIS — I10 Essential (primary) hypertension: Secondary | ICD-10-CM | POA: Diagnosis not present

## 2017-04-21 DIAGNOSIS — M6281 Muscle weakness (generalized): Secondary | ICD-10-CM | POA: Diagnosis not present

## 2017-04-21 DIAGNOSIS — W19XXXA Unspecified fall, initial encounter: Secondary | ICD-10-CM | POA: Diagnosis not present

## 2017-04-21 DIAGNOSIS — R410 Disorientation, unspecified: Secondary | ICD-10-CM | POA: Diagnosis not present

## 2017-04-30 DIAGNOSIS — R05 Cough: Secondary | ICD-10-CM | POA: Diagnosis not present

## 2017-04-30 DIAGNOSIS — R0981 Nasal congestion: Secondary | ICD-10-CM | POA: Diagnosis not present

## 2017-05-17 DIAGNOSIS — D631 Anemia in chronic kidney disease: Secondary | ICD-10-CM | POA: Diagnosis not present

## 2017-05-17 DIAGNOSIS — I129 Hypertensive chronic kidney disease with stage 1 through stage 4 chronic kidney disease, or unspecified chronic kidney disease: Secondary | ICD-10-CM | POA: Diagnosis not present

## 2017-05-17 DIAGNOSIS — E1151 Type 2 diabetes mellitus with diabetic peripheral angiopathy without gangrene: Secondary | ICD-10-CM | POA: Diagnosis not present

## 2017-05-17 DIAGNOSIS — N183 Chronic kidney disease, stage 3 (moderate): Secondary | ICD-10-CM | POA: Diagnosis not present

## 2017-05-17 DIAGNOSIS — R1312 Dysphagia, oropharyngeal phase: Secondary | ICD-10-CM | POA: Diagnosis not present

## 2017-05-17 DIAGNOSIS — E1122 Type 2 diabetes mellitus with diabetic chronic kidney disease: Secondary | ICD-10-CM | POA: Diagnosis not present

## 2017-05-19 DIAGNOSIS — Z8719 Personal history of other diseases of the digestive system: Secondary | ICD-10-CM | POA: Diagnosis not present

## 2017-05-19 DIAGNOSIS — M6281 Muscle weakness (generalized): Secondary | ICD-10-CM | POA: Diagnosis not present

## 2017-05-19 DIAGNOSIS — E118 Type 2 diabetes mellitus with unspecified complications: Secondary | ICD-10-CM | POA: Diagnosis not present

## 2017-05-19 DIAGNOSIS — Z8701 Personal history of pneumonia (recurrent): Secondary | ICD-10-CM | POA: Diagnosis not present

## 2017-05-19 DIAGNOSIS — F0151 Vascular dementia with behavioral disturbance: Secondary | ICD-10-CM | POA: Diagnosis not present

## 2017-05-20 DIAGNOSIS — E1151 Type 2 diabetes mellitus with diabetic peripheral angiopathy without gangrene: Secondary | ICD-10-CM | POA: Diagnosis not present

## 2017-05-20 DIAGNOSIS — D631 Anemia in chronic kidney disease: Secondary | ICD-10-CM | POA: Diagnosis not present

## 2017-05-20 DIAGNOSIS — R1312 Dysphagia, oropharyngeal phase: Secondary | ICD-10-CM | POA: Diagnosis not present

## 2017-05-20 DIAGNOSIS — F0390 Unspecified dementia without behavioral disturbance: Secondary | ICD-10-CM | POA: Diagnosis not present

## 2017-05-20 DIAGNOSIS — I10 Essential (primary) hypertension: Secondary | ICD-10-CM | POA: Diagnosis not present

## 2017-05-20 DIAGNOSIS — E119 Type 2 diabetes mellitus without complications: Secondary | ICD-10-CM | POA: Diagnosis not present

## 2017-05-20 DIAGNOSIS — R41841 Cognitive communication deficit: Secondary | ICD-10-CM | POA: Diagnosis not present

## 2017-05-20 DIAGNOSIS — E1122 Type 2 diabetes mellitus with diabetic chronic kidney disease: Secondary | ICD-10-CM | POA: Diagnosis not present

## 2017-05-20 DIAGNOSIS — E789 Disorder of lipoprotein metabolism, unspecified: Secondary | ICD-10-CM | POA: Diagnosis not present

## 2017-05-20 DIAGNOSIS — I739 Peripheral vascular disease, unspecified: Secondary | ICD-10-CM | POA: Diagnosis not present

## 2017-05-20 DIAGNOSIS — Z8673 Personal history of transient ischemic attack (TIA), and cerebral infarction without residual deficits: Secondary | ICD-10-CM | POA: Diagnosis not present

## 2017-05-20 DIAGNOSIS — I129 Hypertensive chronic kidney disease with stage 1 through stage 4 chronic kidney disease, or unspecified chronic kidney disease: Secondary | ICD-10-CM | POA: Diagnosis not present

## 2017-05-20 DIAGNOSIS — N183 Chronic kidney disease, stage 3 (moderate): Secondary | ICD-10-CM | POA: Diagnosis not present

## 2017-05-20 DIAGNOSIS — M6281 Muscle weakness (generalized): Secondary | ICD-10-CM | POA: Diagnosis not present

## 2017-05-20 DIAGNOSIS — F339 Major depressive disorder, recurrent, unspecified: Secondary | ICD-10-CM | POA: Diagnosis not present

## 2017-05-21 DIAGNOSIS — R1312 Dysphagia, oropharyngeal phase: Secondary | ICD-10-CM | POA: Diagnosis not present

## 2017-05-21 DIAGNOSIS — N183 Chronic kidney disease, stage 3 (moderate): Secondary | ICD-10-CM | POA: Diagnosis not present

## 2017-05-21 DIAGNOSIS — B351 Tinea unguium: Secondary | ICD-10-CM | POA: Diagnosis not present

## 2017-05-21 DIAGNOSIS — I129 Hypertensive chronic kidney disease with stage 1 through stage 4 chronic kidney disease, or unspecified chronic kidney disease: Secondary | ICD-10-CM | POA: Diagnosis not present

## 2017-05-21 DIAGNOSIS — E1151 Type 2 diabetes mellitus with diabetic peripheral angiopathy without gangrene: Secondary | ICD-10-CM | POA: Diagnosis not present

## 2017-05-21 DIAGNOSIS — E1122 Type 2 diabetes mellitus with diabetic chronic kidney disease: Secondary | ICD-10-CM | POA: Diagnosis not present

## 2017-05-21 DIAGNOSIS — M79674 Pain in right toe(s): Secondary | ICD-10-CM | POA: Diagnosis not present

## 2017-05-21 DIAGNOSIS — M79675 Pain in left toe(s): Secondary | ICD-10-CM | POA: Diagnosis not present

## 2017-05-21 DIAGNOSIS — D631 Anemia in chronic kidney disease: Secondary | ICD-10-CM | POA: Diagnosis not present

## 2017-05-22 DIAGNOSIS — I129 Hypertensive chronic kidney disease with stage 1 through stage 4 chronic kidney disease, or unspecified chronic kidney disease: Secondary | ICD-10-CM | POA: Diagnosis not present

## 2017-05-22 DIAGNOSIS — D631 Anemia in chronic kidney disease: Secondary | ICD-10-CM | POA: Diagnosis not present

## 2017-05-22 DIAGNOSIS — R1312 Dysphagia, oropharyngeal phase: Secondary | ICD-10-CM | POA: Diagnosis not present

## 2017-05-22 DIAGNOSIS — N183 Chronic kidney disease, stage 3 (moderate): Secondary | ICD-10-CM | POA: Diagnosis not present

## 2017-05-22 DIAGNOSIS — E1151 Type 2 diabetes mellitus with diabetic peripheral angiopathy without gangrene: Secondary | ICD-10-CM | POA: Diagnosis not present

## 2017-05-22 DIAGNOSIS — E1122 Type 2 diabetes mellitus with diabetic chronic kidney disease: Secondary | ICD-10-CM | POA: Diagnosis not present

## 2017-05-26 DIAGNOSIS — E1165 Type 2 diabetes mellitus with hyperglycemia: Secondary | ICD-10-CM | POA: Diagnosis not present

## 2017-05-26 DIAGNOSIS — F0151 Vascular dementia with behavioral disturbance: Secondary | ICD-10-CM | POA: Diagnosis not present

## 2017-05-26 DIAGNOSIS — M6281 Muscle weakness (generalized): Secondary | ICD-10-CM | POA: Diagnosis not present

## 2017-05-27 DIAGNOSIS — E1122 Type 2 diabetes mellitus with diabetic chronic kidney disease: Secondary | ICD-10-CM | POA: Diagnosis not present

## 2017-05-27 DIAGNOSIS — E1151 Type 2 diabetes mellitus with diabetic peripheral angiopathy without gangrene: Secondary | ICD-10-CM | POA: Diagnosis not present

## 2017-05-27 DIAGNOSIS — D631 Anemia in chronic kidney disease: Secondary | ICD-10-CM | POA: Diagnosis not present

## 2017-05-27 DIAGNOSIS — I129 Hypertensive chronic kidney disease with stage 1 through stage 4 chronic kidney disease, or unspecified chronic kidney disease: Secondary | ICD-10-CM | POA: Diagnosis not present

## 2017-05-27 DIAGNOSIS — N183 Chronic kidney disease, stage 3 (moderate): Secondary | ICD-10-CM | POA: Diagnosis not present

## 2017-05-27 DIAGNOSIS — R1312 Dysphagia, oropharyngeal phase: Secondary | ICD-10-CM | POA: Diagnosis not present

## 2017-05-28 DIAGNOSIS — E1151 Type 2 diabetes mellitus with diabetic peripheral angiopathy without gangrene: Secondary | ICD-10-CM | POA: Diagnosis not present

## 2017-05-28 DIAGNOSIS — R1312 Dysphagia, oropharyngeal phase: Secondary | ICD-10-CM | POA: Diagnosis not present

## 2017-05-28 DIAGNOSIS — D631 Anemia in chronic kidney disease: Secondary | ICD-10-CM | POA: Diagnosis not present

## 2017-05-28 DIAGNOSIS — E1122 Type 2 diabetes mellitus with diabetic chronic kidney disease: Secondary | ICD-10-CM | POA: Diagnosis not present

## 2017-05-28 DIAGNOSIS — I129 Hypertensive chronic kidney disease with stage 1 through stage 4 chronic kidney disease, or unspecified chronic kidney disease: Secondary | ICD-10-CM | POA: Diagnosis not present

## 2017-05-28 DIAGNOSIS — N183 Chronic kidney disease, stage 3 (moderate): Secondary | ICD-10-CM | POA: Diagnosis not present

## 2017-05-29 DIAGNOSIS — R1312 Dysphagia, oropharyngeal phase: Secondary | ICD-10-CM | POA: Diagnosis not present

## 2017-05-29 DIAGNOSIS — E1122 Type 2 diabetes mellitus with diabetic chronic kidney disease: Secondary | ICD-10-CM | POA: Diagnosis not present

## 2017-05-29 DIAGNOSIS — D631 Anemia in chronic kidney disease: Secondary | ICD-10-CM | POA: Diagnosis not present

## 2017-05-29 DIAGNOSIS — I129 Hypertensive chronic kidney disease with stage 1 through stage 4 chronic kidney disease, or unspecified chronic kidney disease: Secondary | ICD-10-CM | POA: Diagnosis not present

## 2017-05-29 DIAGNOSIS — E1151 Type 2 diabetes mellitus with diabetic peripheral angiopathy without gangrene: Secondary | ICD-10-CM | POA: Diagnosis not present

## 2017-05-29 DIAGNOSIS — N183 Chronic kidney disease, stage 3 (moderate): Secondary | ICD-10-CM | POA: Diagnosis not present

## 2017-05-30 DIAGNOSIS — R1312 Dysphagia, oropharyngeal phase: Secondary | ICD-10-CM | POA: Diagnosis not present

## 2017-05-30 DIAGNOSIS — E1151 Type 2 diabetes mellitus with diabetic peripheral angiopathy without gangrene: Secondary | ICD-10-CM | POA: Diagnosis not present

## 2017-05-30 DIAGNOSIS — I129 Hypertensive chronic kidney disease with stage 1 through stage 4 chronic kidney disease, or unspecified chronic kidney disease: Secondary | ICD-10-CM | POA: Diagnosis not present

## 2017-05-30 DIAGNOSIS — N183 Chronic kidney disease, stage 3 (moderate): Secondary | ICD-10-CM | POA: Diagnosis not present

## 2017-05-30 DIAGNOSIS — D631 Anemia in chronic kidney disease: Secondary | ICD-10-CM | POA: Diagnosis not present

## 2017-05-30 DIAGNOSIS — E1122 Type 2 diabetes mellitus with diabetic chronic kidney disease: Secondary | ICD-10-CM | POA: Diagnosis not present

## 2017-06-02 DIAGNOSIS — F528 Other sexual dysfunction not due to a substance or known physiological condition: Secondary | ICD-10-CM | POA: Diagnosis not present

## 2017-06-02 DIAGNOSIS — F0151 Vascular dementia with behavioral disturbance: Secondary | ICD-10-CM | POA: Diagnosis not present

## 2017-06-03 DIAGNOSIS — I129 Hypertensive chronic kidney disease with stage 1 through stage 4 chronic kidney disease, or unspecified chronic kidney disease: Secondary | ICD-10-CM | POA: Diagnosis not present

## 2017-06-03 DIAGNOSIS — D631 Anemia in chronic kidney disease: Secondary | ICD-10-CM | POA: Diagnosis not present

## 2017-06-03 DIAGNOSIS — E1122 Type 2 diabetes mellitus with diabetic chronic kidney disease: Secondary | ICD-10-CM | POA: Diagnosis not present

## 2017-06-03 DIAGNOSIS — R1312 Dysphagia, oropharyngeal phase: Secondary | ICD-10-CM | POA: Diagnosis not present

## 2017-06-03 DIAGNOSIS — E1151 Type 2 diabetes mellitus with diabetic peripheral angiopathy without gangrene: Secondary | ICD-10-CM | POA: Diagnosis not present

## 2017-06-03 DIAGNOSIS — N183 Chronic kidney disease, stage 3 (moderate): Secondary | ICD-10-CM | POA: Diagnosis not present

## 2017-06-05 DIAGNOSIS — D631 Anemia in chronic kidney disease: Secondary | ICD-10-CM | POA: Diagnosis not present

## 2017-06-05 DIAGNOSIS — E1151 Type 2 diabetes mellitus with diabetic peripheral angiopathy without gangrene: Secondary | ICD-10-CM | POA: Diagnosis not present

## 2017-06-05 DIAGNOSIS — E1122 Type 2 diabetes mellitus with diabetic chronic kidney disease: Secondary | ICD-10-CM | POA: Diagnosis not present

## 2017-06-05 DIAGNOSIS — N183 Chronic kidney disease, stage 3 (moderate): Secondary | ICD-10-CM | POA: Diagnosis not present

## 2017-06-05 DIAGNOSIS — R1312 Dysphagia, oropharyngeal phase: Secondary | ICD-10-CM | POA: Diagnosis not present

## 2017-06-05 DIAGNOSIS — I129 Hypertensive chronic kidney disease with stage 1 through stage 4 chronic kidney disease, or unspecified chronic kidney disease: Secondary | ICD-10-CM | POA: Diagnosis not present

## 2017-06-10 DIAGNOSIS — R1312 Dysphagia, oropharyngeal phase: Secondary | ICD-10-CM | POA: Diagnosis not present

## 2017-06-10 DIAGNOSIS — D631 Anemia in chronic kidney disease: Secondary | ICD-10-CM | POA: Diagnosis not present

## 2017-06-10 DIAGNOSIS — N183 Chronic kidney disease, stage 3 (moderate): Secondary | ICD-10-CM | POA: Diagnosis not present

## 2017-06-10 DIAGNOSIS — E1151 Type 2 diabetes mellitus with diabetic peripheral angiopathy without gangrene: Secondary | ICD-10-CM | POA: Diagnosis not present

## 2017-06-10 DIAGNOSIS — I129 Hypertensive chronic kidney disease with stage 1 through stage 4 chronic kidney disease, or unspecified chronic kidney disease: Secondary | ICD-10-CM | POA: Diagnosis not present

## 2017-06-10 DIAGNOSIS — E1122 Type 2 diabetes mellitus with diabetic chronic kidney disease: Secondary | ICD-10-CM | POA: Diagnosis not present

## 2017-06-12 DIAGNOSIS — D631 Anemia in chronic kidney disease: Secondary | ICD-10-CM | POA: Diagnosis not present

## 2017-06-12 DIAGNOSIS — I129 Hypertensive chronic kidney disease with stage 1 through stage 4 chronic kidney disease, or unspecified chronic kidney disease: Secondary | ICD-10-CM | POA: Diagnosis not present

## 2017-06-12 DIAGNOSIS — E1151 Type 2 diabetes mellitus with diabetic peripheral angiopathy without gangrene: Secondary | ICD-10-CM | POA: Diagnosis not present

## 2017-06-12 DIAGNOSIS — N183 Chronic kidney disease, stage 3 (moderate): Secondary | ICD-10-CM | POA: Diagnosis not present

## 2017-06-12 DIAGNOSIS — E1122 Type 2 diabetes mellitus with diabetic chronic kidney disease: Secondary | ICD-10-CM | POA: Diagnosis not present

## 2017-06-12 DIAGNOSIS — R1312 Dysphagia, oropharyngeal phase: Secondary | ICD-10-CM | POA: Diagnosis not present

## 2017-06-16 DIAGNOSIS — E119 Type 2 diabetes mellitus without complications: Secondary | ICD-10-CM | POA: Diagnosis not present

## 2017-06-16 DIAGNOSIS — F0151 Vascular dementia with behavioral disturbance: Secondary | ICD-10-CM | POA: Diagnosis not present

## 2017-06-17 ENCOUNTER — Emergency Department (HOSPITAL_COMMUNITY): Payer: Medicare Other

## 2017-06-17 ENCOUNTER — Emergency Department (HOSPITAL_COMMUNITY)
Admission: EM | Admit: 2017-06-17 | Discharge: 2017-06-18 | Disposition: A | Discharge status: Home / Self Care | Payer: Medicare Other | Attending: Emergency Medicine | Admitting: Emergency Medicine

## 2017-06-17 DIAGNOSIS — E1122 Type 2 diabetes mellitus with diabetic chronic kidney disease: Secondary | ICD-10-CM | POA: Insufficient documentation

## 2017-06-17 DIAGNOSIS — Z79899 Other long term (current) drug therapy: Secondary | ICD-10-CM | POA: Diagnosis not present

## 2017-06-17 DIAGNOSIS — Z794 Long term (current) use of insulin: Secondary | ICD-10-CM | POA: Diagnosis not present

## 2017-06-17 DIAGNOSIS — R404 Transient alteration of awareness: Secondary | ICD-10-CM | POA: Diagnosis not present

## 2017-06-17 DIAGNOSIS — F17210 Nicotine dependence, cigarettes, uncomplicated: Secondary | ICD-10-CM | POA: Diagnosis not present

## 2017-06-17 DIAGNOSIS — I129 Hypertensive chronic kidney disease with stage 1 through stage 4 chronic kidney disease, or unspecified chronic kidney disease: Secondary | ICD-10-CM | POA: Insufficient documentation

## 2017-06-17 DIAGNOSIS — R42 Dizziness and giddiness: Secondary | ICD-10-CM | POA: Insufficient documentation

## 2017-06-17 DIAGNOSIS — E1140 Type 2 diabetes mellitus with diabetic neuropathy, unspecified: Secondary | ICD-10-CM | POA: Diagnosis not present

## 2017-06-17 DIAGNOSIS — E875 Hyperkalemia: Secondary | ICD-10-CM | POA: Insufficient documentation

## 2017-06-17 DIAGNOSIS — E1165 Type 2 diabetes mellitus with hyperglycemia: Secondary | ICD-10-CM | POA: Insufficient documentation

## 2017-06-17 DIAGNOSIS — N183 Chronic kidney disease, stage 3 (moderate): Secondary | ICD-10-CM | POA: Diagnosis not present

## 2017-06-17 DIAGNOSIS — R1312 Dysphagia, oropharyngeal phase: Secondary | ICD-10-CM | POA: Diagnosis not present

## 2017-06-17 DIAGNOSIS — Z7982 Long term (current) use of aspirin: Secondary | ICD-10-CM | POA: Diagnosis not present

## 2017-06-17 DIAGNOSIS — S0990XA Unspecified injury of head, initial encounter: Secondary | ICD-10-CM | POA: Diagnosis not present

## 2017-06-17 DIAGNOSIS — S199XXA Unspecified injury of neck, initial encounter: Secondary | ICD-10-CM | POA: Diagnosis not present

## 2017-06-17 DIAGNOSIS — D649 Anemia, unspecified: Secondary | ICD-10-CM | POA: Insufficient documentation

## 2017-06-17 DIAGNOSIS — E1151 Type 2 diabetes mellitus with diabetic peripheral angiopathy without gangrene: Secondary | ICD-10-CM | POA: Diagnosis not present

## 2017-06-17 DIAGNOSIS — W050XXA Fall from non-moving wheelchair, initial encounter: Secondary | ICD-10-CM | POA: Diagnosis not present

## 2017-06-17 DIAGNOSIS — F0390 Unspecified dementia without behavioral disturbance: Secondary | ICD-10-CM | POA: Diagnosis not present

## 2017-06-17 DIAGNOSIS — N189 Chronic kidney disease, unspecified: Secondary | ICD-10-CM | POA: Diagnosis not present

## 2017-06-17 DIAGNOSIS — D631 Anemia in chronic kidney disease: Secondary | ICD-10-CM | POA: Diagnosis not present

## 2017-06-17 DIAGNOSIS — W19XXXA Unspecified fall, initial encounter: Secondary | ICD-10-CM

## 2017-06-17 LAB — CBC WITH DIFFERENTIAL/PLATELET
BASOS PCT: 1 %
Basophils Absolute: 0.1 10*3/uL (ref 0.0–0.1)
EOS ABS: 0.3 10*3/uL (ref 0.0–0.7)
Eosinophils Relative: 4 %
HCT: 29.7 % — ABNORMAL LOW (ref 39.0–52.0)
Hemoglobin: 9.3 g/dL — ABNORMAL LOW (ref 13.0–17.0)
Lymphocytes Relative: 34 %
Lymphs Abs: 2.4 10*3/uL (ref 0.7–4.0)
MCH: 29.4 pg (ref 26.0–34.0)
MCHC: 31.3 g/dL (ref 30.0–36.0)
MCV: 94 fL (ref 78.0–100.0)
MONO ABS: 0.4 10*3/uL (ref 0.1–1.0)
MONOS PCT: 6 %
Neutro Abs: 3.9 10*3/uL (ref 1.7–7.7)
Neutrophils Relative %: 55 %
Platelets: 219 10*3/uL (ref 150–400)
RBC: 3.16 MIL/uL — ABNORMAL LOW (ref 4.22–5.81)
RDW: 14.2 % (ref 11.5–15.5)
WBC: 7.1 10*3/uL (ref 4.0–10.5)

## 2017-06-17 LAB — I-STAT CHEM 8, ED
BUN: 34 mg/dL — AB (ref 6–20)
CALCIUM ION: 1.13 mmol/L — AB (ref 1.15–1.40)
CREATININE: 1.8 mg/dL — AB (ref 0.61–1.24)
Chloride: 104 mmol/L (ref 101–111)
GLUCOSE: 262 mg/dL — AB (ref 65–99)
HCT: 29 % — ABNORMAL LOW (ref 39.0–52.0)
Hemoglobin: 9.9 g/dL — ABNORMAL LOW (ref 13.0–17.0)
Potassium: 5.5 mmol/L — ABNORMAL HIGH (ref 3.5–5.1)
Sodium: 137 mmol/L (ref 135–145)
TCO2: 22 mmol/L (ref 22–32)

## 2017-06-17 NOTE — ED Notes (Signed)
Removed a sliver and gold necklace from pt  Placed in jar with pt label per provider order

## 2017-06-17 NOTE — ED Provider Notes (Signed)
[facility] DEPT Provider Note   CSN: [id number] Arrival date & time: 06/17/17  2100     History   Chief Complaint No chief complaint on file. Chief complaint fall Level 5 caveat dementia.  History is obtained from paramedics.  I attempted to call [facility] facility at 10:40 PM.  No answer.  Received recording message that this number does not receive voicemails patient reportedly got up from a wheelchair and fell striking the back of his head.  Patient denies pain anywhere.  EMS obtained CBG which was 325 EMS treated patient with hard cervical collar. HPI Manuel DAYWALT Sr. is a 72 y.o. male.  HPI  Past Medical History:  Diagnosis Date  . Anemia   . Diabetes ([place])   . GERD (gastroesophageal reflux disease)   . Hyperlipidemia   . Hypertension   . Leukemia [facility])     Patient Active Problem List   Diagnosis Date Noted  . Acute pancreatitis without infection or necrosis   . Hypothermia 04/10/2017  . AKI (acute kidney injury) ([place]) 04/10/2017  . Hyperkalemia 04/10/2017  . Acute metabolic encephalopathy 40/98/1191  . Dementia 04/10/2017  . Sepsis ([place]) 04/10/2017  . HCAP (healthcare-associated pneumonia) 04/10/2017  . Rhabdomyolysis 11/14/2016  . Multiple lacunar infarcts 11/06/2016  . Small vessel disease, cerebrovascular 11/06/2016  . Syncope 09/13/2016  . Dermatitis 03/01/2016  . Anemia, pernicious 07/04/2015  . Hip pain 07/04/2015  . Aortic valve sclerosis 02/19/2015  . Chronic lymphocytic leukemia ([place]) 02/19/2015  . DM type 2 with diabetic peripheral neuropathy ([place]) 02/19/2015  . Essential (primary) hypertension 02/19/2015  . DM type 2 with diabetic mixed hyperlipidemia ([place]) 02/19/2015  . Diabetic peripheral neuropathy associated with type 2 diabetes mellitus ([place]) 02/19/2015  . Bleeding ulcer 02/19/2015  . Peripheral vascular disease ([place]) 02/19/2015  . 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    . Encounter for screening for malignant neoplasm of prostate 02/19/2015  . Type 2 diabetes mellitus with stage 3 chronic kidney disease ([place]) 02/19/2015  . Compulsive tobacco user syndrome 02/19/2015  . 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  . Aortic valve defect 12/22/2012  . 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[place]) 11/13/2011    Past Surgical History:  Procedure Laterality Date  . CATARACT EXTRACTION W/PHACO Left 09/13/2014   Procedure: CATARACT EXTRACTION PHACO AND INTRAOCULAR LENS PLACEMENT (IOC);  Surgeon: Birder Robson, MD;  Location: [facility] ORS;  Service: Ophthalmology;  Laterality: Left;  [place] 01:03   . COLONOSCOPY  2014  . POPLITEAL ARTERY ANGIOPLASTY Left 2014  . PTCA Right    leg  . UPPER GASTROINTESTINAL ENDOSCOPY  2014       Home Medications    Prior to Admission medications   Medication Sig Start Date End Date Taking? Authorizing Provider  acetaminophen (TYLENOL) 500 MG tablet Take 500 mg by mouth every 4 (four) hours as needed for mild pain, fever or headache. NOT TO EXCEED 2000 MG IN 24 HOURS   Yes [provider]  alum & mag hydroxide-simeth (MINTOX) 478-295-62 MG/5ML suspension Take 30 mLs by mouth every 6 (six) hours as needed for indigestion or heartburn. NOT TO EXCEED 4 DOSES IN 24 HOURS   Yes [provider]  amLODipine (NORVASC) 10 MG tablet Take 1 tablet (10 mg total) by mouth daily. 04/15/17  Yes [name], MD  aspirin 81 MG tablet Take 1 tablet (81 mg total) by mouth daily. 08/14/16  Yes [name], MD  atorvastatin (LIPITOR)  10 MG tablet Take 10 mg by mouth daily.   Yes [provider]  gabapentin (NEURONTIN) 300 MG capsule TAKE 1 CAPSULE BY MOUTH 3  TIMES DAILY Patient taking differently: TAKE 300 mg BY MOUTH 3  TIMES DAILY 08/30/16  Yes [name], MD  guaifenesin (ROBITUSSIN) 100 MG/5ML syrup Take 200 mg by mouth 4 (four) times daily as needed for cough. NOT TO EXCEED 4 DOSES IN  24 HOURS   Yes [provider]  hydrALAZINE (APRESOLINE) 25 MG tablet Take 25 mg by mouth 3 (three) times daily.   Yes [provider]  insulin glargine (LANTUS) 100 UNIT/ML injection Inject 0.12 mLs (12 Units total) into the skin daily. Patient taking differently: Inject 14 Units into the skin daily.  04/16/17  Yes [name], MD  insulin lispro (HUMALOG KWIKPEN) 100 UNIT/ML KiwkPen Inject 3 Units into the skin 3 (three) times daily. Hold if meal time BS is 100 or less. Call MD if BS greater than 450   Yes [provider]  ipratropium-albuterol (DUONEB) 0.5-2.5 (3) MG/3ML SOLN Take 3 mLs by nebulization every 2 (two) hours as needed. Patient taking differently: Take 3 mLs by nebulization 3 (three) times daily.  04/15/17  Yes [name], MD  loperamide (IMODIUM) 2 MG capsule Take 2 mg by mouth as needed for diarrhea or loose stools. DO NOT EXCEED 8 DOSES IN 24 HOURS   Yes [provider]  magnesium hydroxide (MILK OF MAGNESIA) 400 MG/5ML suspension Take 30 mLs by mouth at bedtime as needed for mild constipation or moderate constipation.   Yes [provider]  metFORMIN (GLUCOPHAGE) 500 MG tablet Take 1 tablet (500 mg total) by mouth 2 (two) times daily with a meal. 04/16/16 06/17/17 Yes [name], MD  Multiple Vitamin (MULTIVITAMIN WITH MINERALS) TABS tablet Take 1 tablet by mouth daily.   Yes [provider]  mupirocin ointment (BACTROBAN) 2 % Apply to affected area 3 times daily Patient taking differently: Apply 1 application topically 3 (three) times daily.  08/14/16 08/14/17 Yes [name], MD  neomycin-bacitracin-polymyxin (NEOSPORIN) ointment Apply 1 application topically every 8 (eight) hours as needed for wound care.    Yes [provider]  PARoxetine (PAXIL) 20 MG tablet Take 20 mg by mouth daily.   Yes [provider]  QUEtiapine (SEROQUEL) 25 MG tablet Take 1 tablet (25 mg total) by mouth at  bedtime. Patient taking differently: Take 25 mg by mouth daily.  04/15/17  Yes [name], MD  triamcinolone cream (KENALOG) 0.1 % APPLY TO AFFECTED AREA TWICE A DAY Patient taking differently: Apply 1 application topically 2 (two) times daily.  08/07/16  Yes Daleen Bo, MD  vitamin C (ASCORBIC ACID) 250 MG tablet Take 500 mg by mouth 2 (two) times daily.   Yes [provider]  zinc sulfate 220 (50 Zn) MG capsule Take 220 mg by mouth daily.   Yes [provider]  enoxaparin (LOVENOX) 40 MG/0.4ML injection Inject 0.4 mLs (40 mg total) into the skin daily. Patient not taking: Reported on 06/17/2017 04/15/17   [name], MD  glucose blood test strip Use as instructed 03/06/15   [name], MD  hydrALAZINE (APRESOLINE) 50 MG tablet Take 1.5 tablets (75 mg total) by mouth every 8 (eight) hours. Patient not taking: Reported on 06/17/2017 04/15/17   [name], MD  insulin aspart (NOVOLOG) 100 UNIT/ML injection Inject 0-9 Units into the skin 4 (four) times  daily -  before meals and at bedtime. Patient not taking: Reported on 06/17/2017 04/15/17   [name], MD  Lancets 28G MISC 1 each by Does not apply route daily. 03/06/15   [name], MD  simvastatin (ZOCOR) 20 MG tablet TAKE 1 TABLET BY MOUTH AT  BEDTIME Patient not taking: Reported on 06/17/2017 08/30/16   [name], MD    Family History Family History  Problem Relation Age of Onset  . Diabetes Mother   . Diabetes Father     Social History Social History   Tobacco Use  . Smoking status: Current Every Day Smoker    Packs/day: 0.50    Types: Cigarettes  . Smokeless tobacco: Never Used  Substance Use Topics  . Alcohol use: No    Alcohol/week: 0.0 oz  . Drug use: No     Allergies   Patient has no known allergies.   Review of Systems Review of Systems  Unable to perform ROS: Dementia     Physical Exam Updated Vital Signs There were no vitals taken for this  visit.  Physical Exam  Constitutional: No distress.  HENT:  Head: Normocephalic and atraumatic.  Eyes: Conjunctivae are normal. Pupils are equal, round, and reactive to light.  Neck: Neck supple. No tracheal deviation present. No thyromegaly present.  Trachea no soft tissue swelling midline no tenderness  Cardiovascular: Normal rate and regular rhythm.  No murmur heard. Pulmonary/Chest: Effort normal and breath sounds normal.  Abdominal: Soft. Bowel sounds are normal. He exhibits no distension. There is no tenderness.  Musculoskeletal: Normal range of motion. He exhibits no edema or tenderness.  Tired spine nontender.  Pelvis stable nontender all 4 extremities without deformity or swelling or tenderness neurovascular intact  Neurological: He is alert. Coordination normal.  Oriented to name, moves all extremities cranial nerves II through XII grossly intact motor strength 5/5 overall  Skin: Skin is warm and dry. No rash noted.  Psychiatric: He has a normal mood and affect.  Nursing note and vitals reviewed.    ED Treatments / Results  Labs (all labs ordered are listed, but only abnormal results are displayed) Labs Reviewed  CBC WITH DIFFERENTIAL/PLATELET  I-STAT CHEM 8, ED   Results for orders placed or performed during the hospital encounter of 06/17/17  CBC with Differential/Platelet  Result Value Ref Range   WBC 7.1 4.0 - 10.5 K/uL   RBC 3.16 (L) 4.22 - 5.81 MIL/uL   Hemoglobin 9.3 (L) 13.0 - 17.0 g/dL   HCT 29.7 (L) 39.0 - 52.0 %   MCV 94.0 78.0 - 100.0 fL   MCH 29.4 26.0 - 34.0 pg   MCHC 31.3 30.0 - 36.0 g/dL   RDW 14.2 11.5 - 15.5 %   Platelets 219 150 - 400 K/uL   Neutrophils Relative % 55 %   Neutro Abs 3.9 1.7 - 7.7 K/uL   Lymphocytes Relative 34 %   Lymphs Abs 2.4 0.7 - 4.0 K/uL   Monocytes Relative 6 %   Monocytes Absolute 0.4 0.1 - 1.0 K/uL   Eosinophils Relative 4 %   Eosinophils Absolute 0.3 0.0 - 0.7 K/uL   Basophils Relative 1 %   Basophils Absolute  0.1 0.0 - 0.1 K/uL  I-stat chem 8, ed  Result Value Ref Range   Sodium 137 135 - 145 mmol/L   Potassium 5.5 (H) 3.5 - 5.1 mmol/L   Chloride 104 101 - 111 mmol/L   BUN 34 (H) 6 - 20 mg/dL  Creatinine, Ser 1.80 (H) 0.61 - 1.24 mg/dL   Glucose, Bld 262 (H) 65 - 99 mg/dL   Calcium, Ion 1.13 (L) 1.15 - 1.40 mmol/L   TCO2 22 22 - 32 mmol/L   Hemoglobin 9.9 (L) 13.0 - 17.0 g/dL   HCT 29.0 (L) 39.0 - 52.0 %   Ct Head Wo Contrast  Result Date: 06/17/2017 CLINICAL DATA:  Fall getting up from wheelchair after feeling dizzy striking back of head. Cannot clear cervical spine clinically. EXAM: CT HEAD WITHOUT CONTRAST CT CERVICAL SPINE WITHOUT CONTRAST TECHNIQUE: Multidetector CT imaging of the head and cervical spine was performed following the standard protocol without intravenous contrast. Multiplanar CT image reconstructions of the cervical spine were also generated. COMPARISON:  Head CT 04/10/2017 FINDINGS: CT HEAD FINDINGS Brain: No intracranial hemorrhage. Stable chronic hyperdensity in the right cerebellum prior exams, may be small cavernoma or nonspecific calcification. No subdural or extra-axial fluid collection. Stable degree of atrophy and chronic small vessel ischemia. Multifocal remote lacunar infarcts in bilateral basal ganglia, thalami and right centrum semi ovale. Stable ventricular size. Basilar cisterns are patent. No evidence of acute ischemia. Vascular: Atherosclerosis of skullbase vasculature without hyperdense vessel or abnormal calcification. Skull: No fracture or focal lesion. Sinuses/Orbits: No acute finding. Bilateral cataract resection. Minimal chronic opacification of lower left mastoid air cells. Other: None. CT CERVICAL SPINE FINDINGS Alignment: Mild reversal of normal lordosis. No traumatic subluxation. Skull base and vertebrae: No acute fracture. Vertebral body heights are maintained. The dens and skull base are intact. Soft tissues and spinal canal: No prevertebral fluid or  swelling. No visible canal hematoma. Disc levels: Disc space narrowing with endplate spurring, large anterior osteophytes at C5-C6. Scattered facet arthropathy is mild. No spinal canal narrowing. Upper chest: Mild apical emphysema. Other: Tortuous cervical vasculature with dense atherosclerosis. IMPRESSION: 1.  No acute intracranial abnormality.  No skull fracture. 2. No acute fracture or subluxation of the cervical spine. Multilevel degenerative change. 3. Stable atrophy, chronic small vessel ischemia and multifocal remote lacunar infarcts. 4. Carotid and skull base atherosclerosis. Electronically Signed   By: Jeb Levering M.D.   On: 06/17/2017 22:27   Ct Cervical Spine Wo Contrast  Result Date: 06/17/2017 CLINICAL DATA:  Fall getting up from wheelchair after feeling dizzy striking back of head. Cannot clear cervical spine clinically. EXAM: CT HEAD WITHOUT CONTRAST CT CERVICAL SPINE WITHOUT CONTRAST TECHNIQUE: Multidetector CT imaging of the head and cervical spine was performed following the standard protocol without intravenous contrast. Multiplanar CT image reconstructions of the cervical spine were also generated. COMPARISON:  Head CT 04/10/2017 FINDINGS: CT HEAD FINDINGS Brain: No intracranial hemorrhage. Stable chronic hyperdensity in the right cerebellum prior exams, may be small cavernoma or nonspecific calcification. No subdural or extra-axial fluid collection. Stable degree of atrophy and chronic small vessel ischemia. Multifocal remote lacunar infarcts in bilateral basal ganglia, thalami and right centrum semi ovale. Stable ventricular size. Basilar cisterns are patent. No evidence of acute ischemia. Vascular: Atherosclerosis of skullbase vasculature without hyperdense vessel or abnormal calcification. Skull: No fracture or focal lesion. Sinuses/Orbits: No acute finding. Bilateral cataract resection. Minimal chronic opacification of lower left mastoid air cells. Other: None. CT CERVICAL SPINE  FINDINGS Alignment: Mild reversal of normal lordosis. No traumatic subluxation. Skull base and vertebrae: No acute fracture. Vertebral body heights are maintained. The dens and skull base are intact. Soft tissues and spinal canal: No prevertebral fluid or swelling. No visible canal hematoma. Disc levels: Disc space narrowing with endplate spurring, large anterior osteophytes  at C5-C6. Scattered facet arthropathy is mild. No spinal canal narrowing. Upper chest: Mild apical emphysema. Other: Tortuous cervical vasculature with dense atherosclerosis. IMPRESSION: 1.  No acute intracranial abnormality.  No skull fracture. 2. No acute fracture or subluxation of the cervical spine. Multilevel degenerative change. 3. Stable atrophy, chronic small vessel ischemia and multifocal remote lacunar infarcts. 4. Carotid and skull base atherosclerosis. Electronically Signed   By: Jeb Levering M.D.   On: 06/17/2017 22:27   EKG  EKG Interpretation  Date/Time:  Tuesday June 17 2017 23:49:18 EDT Ventricular Rate:  63 PR Interval:    QRS Duration: 129 QT Interval:  435 QTC Calculation: 446 R Axis:   35 Text Interpretation:  Sinus rhythm Probable left atrial enlargement Right bundle branch block Borderline ST elevation, anterior leads No significant change since last tracing Confirmed by Orlie Dakin [id number]) on 06/17/2017 11:52:10 PM       Radiology No results found.  Procedures Procedures (including critical care time)  Medications Ordered in ED Medications - No data to display   Initial Impression / Assessment and Plan / ED Course  I have reviewed the triage vital signs and the nursing notes.  Pertinent labs & imaging results that were available during my care of the patient were reviewed by me and considered in my medical decision making (see chart for details).     11:30 PM patient remains asymptomatic alert and appropriate.  I discussed case with patient's daughter Manuel Randolph via telephone  who will check on him tomorrow.  Patient will require follow-up lab work this week to include renal function and repeat lites.   Renal insufficiency is chronic single 5.5 does not require treatment.  There is no EKG evidence of hyperkalemia anemia is chronic Final Clinical Impressions(s) / ED Diagnoses  Diagnoses #1 fall #2 hyperglycemia #3 chronic renal insufficiency #4 mild hyperkalemia #5 anemia #6 elevated blood pressure   #3 renal insufficiency #4 mild hyperkalemia Final diagnoses:  None    ED Discharge Orders    None       Orlie Dakin, MD 06/17/17 2352

## 2017-06-17 NOTE — Discharge Instructions (Signed)
Call Manuel Randolph's doctor tomorrow to arrange to get blood work rechecked this week.  His serum potassium was 5.5, blood glucose was 262.  BUN was 34 and creatinine was 1.8.  CT scans of head and cervical spine showed no injury.  Blood pressure should be rechecked this week.  Today's was mildly elevated at 153/62 have him return or contact his physician if his condition worsens for any reason

## 2017-06-17 NOTE — ED Notes (Signed)
Bed: WA04 Expected date:  Expected time:  Means of arrival:  Comments: EMS 72 yo male from Nursing Home-stood-fell/ dizzy and struck back of head-

## 2017-06-17 NOTE — ED Notes (Signed)
Nurse at bedside collecting blood draw.

## 2017-06-17 NOTE — ED Triage Notes (Signed)
Pt comes from [facility] off [address], [place]. Pt got up from a wheel chair, felt dizzy and then fell backward hitting back head. No loc, no blood thinners.new onset dizziness with unwitnessed fall. Denies pain, no edema no deformity, no lac. Alert to person and event per normal baseline. Dementia at baseline.  V/s on arrival bp 153/79, hr 70, sinus with a right bundle branch. cbg 325, pt has a hx of diabetes. spo2 100 room air c- collar on no weakness in any extremities, SCCA pass

## 2017-06-18 DIAGNOSIS — R4182 Altered mental status, unspecified: Secondary | ICD-10-CM | POA: Diagnosis not present

## 2017-06-18 DIAGNOSIS — R1312 Dysphagia, oropharyngeal phase: Secondary | ICD-10-CM | POA: Diagnosis not present

## 2017-06-18 DIAGNOSIS — I129 Hypertensive chronic kidney disease with stage 1 through stage 4 chronic kidney disease, or unspecified chronic kidney disease: Secondary | ICD-10-CM | POA: Diagnosis not present

## 2017-06-18 DIAGNOSIS — E1122 Type 2 diabetes mellitus with diabetic chronic kidney disease: Secondary | ICD-10-CM | POA: Diagnosis not present

## 2017-06-18 DIAGNOSIS — G8911 Acute pain due to trauma: Secondary | ICD-10-CM | POA: Diagnosis not present

## 2017-06-18 DIAGNOSIS — D631 Anemia in chronic kidney disease: Secondary | ICD-10-CM | POA: Diagnosis not present

## 2017-06-18 DIAGNOSIS — N183 Chronic kidney disease, stage 3 (moderate): Secondary | ICD-10-CM | POA: Diagnosis not present

## 2017-06-18 DIAGNOSIS — E1151 Type 2 diabetes mellitus with diabetic peripheral angiopathy without gangrene: Secondary | ICD-10-CM | POA: Diagnosis not present

## 2017-06-18 NOTE — ED Notes (Signed)
PTAR contacted for discharge transport back to [facility] 

## 2017-06-19 DIAGNOSIS — N183 Chronic kidney disease, stage 3 (moderate): Secondary | ICD-10-CM | POA: Diagnosis not present

## 2017-06-19 DIAGNOSIS — E1151 Type 2 diabetes mellitus with diabetic peripheral angiopathy without gangrene: Secondary | ICD-10-CM | POA: Diagnosis not present

## 2017-06-19 DIAGNOSIS — I129 Hypertensive chronic kidney disease with stage 1 through stage 4 chronic kidney disease, or unspecified chronic kidney disease: Secondary | ICD-10-CM | POA: Diagnosis not present

## 2017-06-19 DIAGNOSIS — E1122 Type 2 diabetes mellitus with diabetic chronic kidney disease: Secondary | ICD-10-CM | POA: Diagnosis not present

## 2017-06-19 DIAGNOSIS — R1312 Dysphagia, oropharyngeal phase: Secondary | ICD-10-CM | POA: Diagnosis not present

## 2017-06-19 DIAGNOSIS — D631 Anemia in chronic kidney disease: Secondary | ICD-10-CM | POA: Diagnosis not present

## 2017-06-23 DIAGNOSIS — D631 Anemia in chronic kidney disease: Secondary | ICD-10-CM | POA: Diagnosis not present

## 2017-06-23 DIAGNOSIS — E1122 Type 2 diabetes mellitus with diabetic chronic kidney disease: Secondary | ICD-10-CM | POA: Diagnosis not present

## 2017-06-23 DIAGNOSIS — E1151 Type 2 diabetes mellitus with diabetic peripheral angiopathy without gangrene: Secondary | ICD-10-CM | POA: Diagnosis not present

## 2017-06-23 DIAGNOSIS — I129 Hypertensive chronic kidney disease with stage 1 through stage 4 chronic kidney disease, or unspecified chronic kidney disease: Secondary | ICD-10-CM | POA: Diagnosis not present

## 2017-06-23 DIAGNOSIS — R1312 Dysphagia, oropharyngeal phase: Secondary | ICD-10-CM | POA: Diagnosis not present

## 2017-06-23 DIAGNOSIS — N183 Chronic kidney disease, stage 3 (moderate): Secondary | ICD-10-CM | POA: Diagnosis not present

## 2017-06-25 DIAGNOSIS — E782 Mixed hyperlipidemia: Secondary | ICD-10-CM | POA: Diagnosis not present

## 2017-06-25 DIAGNOSIS — E785 Hyperlipidemia, unspecified: Secondary | ICD-10-CM | POA: Diagnosis not present

## 2017-06-25 DIAGNOSIS — D649 Anemia, unspecified: Secondary | ICD-10-CM | POA: Diagnosis not present

## 2017-06-25 DIAGNOSIS — E039 Hypothyroidism, unspecified: Secondary | ICD-10-CM | POA: Diagnosis not present

## 2017-06-25 DIAGNOSIS — E119 Type 2 diabetes mellitus without complications: Secondary | ICD-10-CM | POA: Diagnosis not present

## 2017-06-27 DIAGNOSIS — N183 Chronic kidney disease, stage 3 (moderate): Secondary | ICD-10-CM | POA: Diagnosis not present

## 2017-06-27 DIAGNOSIS — E1151 Type 2 diabetes mellitus with diabetic peripheral angiopathy without gangrene: Secondary | ICD-10-CM | POA: Diagnosis not present

## 2017-06-27 DIAGNOSIS — D631 Anemia in chronic kidney disease: Secondary | ICD-10-CM | POA: Diagnosis not present

## 2017-06-27 DIAGNOSIS — E1122 Type 2 diabetes mellitus with diabetic chronic kidney disease: Secondary | ICD-10-CM | POA: Diagnosis not present

## 2017-06-27 DIAGNOSIS — I129 Hypertensive chronic kidney disease with stage 1 through stage 4 chronic kidney disease, or unspecified chronic kidney disease: Secondary | ICD-10-CM | POA: Diagnosis not present

## 2017-06-27 DIAGNOSIS — R1312 Dysphagia, oropharyngeal phase: Secondary | ICD-10-CM | POA: Diagnosis not present

## 2017-07-01 DIAGNOSIS — E1151 Type 2 diabetes mellitus with diabetic peripheral angiopathy without gangrene: Secondary | ICD-10-CM | POA: Diagnosis not present

## 2017-07-01 DIAGNOSIS — R1312 Dysphagia, oropharyngeal phase: Secondary | ICD-10-CM | POA: Diagnosis not present

## 2017-07-01 DIAGNOSIS — N183 Chronic kidney disease, stage 3 (moderate): Secondary | ICD-10-CM | POA: Diagnosis not present

## 2017-07-01 DIAGNOSIS — D631 Anemia in chronic kidney disease: Secondary | ICD-10-CM | POA: Diagnosis not present

## 2017-07-01 DIAGNOSIS — I129 Hypertensive chronic kidney disease with stage 1 through stage 4 chronic kidney disease, or unspecified chronic kidney disease: Secondary | ICD-10-CM | POA: Diagnosis not present

## 2017-07-01 DIAGNOSIS — E1122 Type 2 diabetes mellitus with diabetic chronic kidney disease: Secondary | ICD-10-CM | POA: Diagnosis not present

## 2017-07-04 DIAGNOSIS — D631 Anemia in chronic kidney disease: Secondary | ICD-10-CM | POA: Diagnosis not present

## 2017-07-04 DIAGNOSIS — E1151 Type 2 diabetes mellitus with diabetic peripheral angiopathy without gangrene: Secondary | ICD-10-CM | POA: Diagnosis not present

## 2017-07-04 DIAGNOSIS — N183 Chronic kidney disease, stage 3 (moderate): Secondary | ICD-10-CM | POA: Diagnosis not present

## 2017-07-04 DIAGNOSIS — R1312 Dysphagia, oropharyngeal phase: Secondary | ICD-10-CM | POA: Diagnosis not present

## 2017-07-04 DIAGNOSIS — I129 Hypertensive chronic kidney disease with stage 1 through stage 4 chronic kidney disease, or unspecified chronic kidney disease: Secondary | ICD-10-CM | POA: Diagnosis not present

## 2017-07-04 DIAGNOSIS — E1122 Type 2 diabetes mellitus with diabetic chronic kidney disease: Secondary | ICD-10-CM | POA: Diagnosis not present

## 2017-07-09 DIAGNOSIS — R1312 Dysphagia, oropharyngeal phase: Secondary | ICD-10-CM | POA: Diagnosis not present

## 2017-07-09 DIAGNOSIS — E1122 Type 2 diabetes mellitus with diabetic chronic kidney disease: Secondary | ICD-10-CM | POA: Diagnosis not present

## 2017-07-09 DIAGNOSIS — I129 Hypertensive chronic kidney disease with stage 1 through stage 4 chronic kidney disease, or unspecified chronic kidney disease: Secondary | ICD-10-CM | POA: Diagnosis not present

## 2017-07-09 DIAGNOSIS — E1151 Type 2 diabetes mellitus with diabetic peripheral angiopathy without gangrene: Secondary | ICD-10-CM | POA: Diagnosis not present

## 2017-07-09 DIAGNOSIS — N183 Chronic kidney disease, stage 3 (moderate): Secondary | ICD-10-CM | POA: Diagnosis not present

## 2017-07-09 DIAGNOSIS — D631 Anemia in chronic kidney disease: Secondary | ICD-10-CM | POA: Diagnosis not present

## 2017-07-15 DIAGNOSIS — E1122 Type 2 diabetes mellitus with diabetic chronic kidney disease: Secondary | ICD-10-CM | POA: Diagnosis not present

## 2017-07-15 DIAGNOSIS — N183 Chronic kidney disease, stage 3 (moderate): Secondary | ICD-10-CM | POA: Diagnosis not present

## 2017-07-15 DIAGNOSIS — D631 Anemia in chronic kidney disease: Secondary | ICD-10-CM | POA: Diagnosis not present

## 2017-07-15 DIAGNOSIS — E1151 Type 2 diabetes mellitus with diabetic peripheral angiopathy without gangrene: Secondary | ICD-10-CM | POA: Diagnosis not present

## 2017-07-15 DIAGNOSIS — I129 Hypertensive chronic kidney disease with stage 1 through stage 4 chronic kidney disease, or unspecified chronic kidney disease: Secondary | ICD-10-CM | POA: Diagnosis not present

## 2017-07-15 DIAGNOSIS — R1312 Dysphagia, oropharyngeal phase: Secondary | ICD-10-CM | POA: Diagnosis not present

## 2017-07-23 DIAGNOSIS — B351 Tinea unguium: Secondary | ICD-10-CM | POA: Diagnosis not present

## 2017-07-23 DIAGNOSIS — M79674 Pain in right toe(s): Secondary | ICD-10-CM | POA: Diagnosis not present

## 2017-07-23 DIAGNOSIS — M79675 Pain in left toe(s): Secondary | ICD-10-CM | POA: Diagnosis not present

## 2017-08-19 DIAGNOSIS — R2681 Unsteadiness on feet: Secondary | ICD-10-CM | POA: Diagnosis not present

## 2017-08-19 DIAGNOSIS — R278 Other lack of coordination: Secondary | ICD-10-CM | POA: Diagnosis not present

## 2017-08-19 DIAGNOSIS — M6281 Muscle weakness (generalized): Secondary | ICD-10-CM | POA: Diagnosis not present

## 2017-08-20 DIAGNOSIS — R278 Other lack of coordination: Secondary | ICD-10-CM | POA: Diagnosis not present

## 2017-08-20 DIAGNOSIS — M6281 Muscle weakness (generalized): Secondary | ICD-10-CM | POA: Diagnosis not present

## 2017-08-20 DIAGNOSIS — R2681 Unsteadiness on feet: Secondary | ICD-10-CM | POA: Diagnosis not present

## 2017-08-22 DIAGNOSIS — R278 Other lack of coordination: Secondary | ICD-10-CM | POA: Diagnosis not present

## 2017-08-22 DIAGNOSIS — R2681 Unsteadiness on feet: Secondary | ICD-10-CM | POA: Diagnosis not present

## 2017-08-22 DIAGNOSIS — M6281 Muscle weakness (generalized): Secondary | ICD-10-CM | POA: Diagnosis not present

## 2017-08-25 DIAGNOSIS — R2681 Unsteadiness on feet: Secondary | ICD-10-CM | POA: Diagnosis not present

## 2017-08-25 DIAGNOSIS — R278 Other lack of coordination: Secondary | ICD-10-CM | POA: Diagnosis not present

## 2017-08-25 DIAGNOSIS — M6281 Muscle weakness (generalized): Secondary | ICD-10-CM | POA: Diagnosis not present

## 2017-08-26 DIAGNOSIS — F0151 Vascular dementia with behavioral disturbance: Secondary | ICD-10-CM | POA: Diagnosis not present

## 2017-08-26 DIAGNOSIS — R05 Cough: Secondary | ICD-10-CM | POA: Diagnosis not present

## 2017-08-26 DIAGNOSIS — R278 Other lack of coordination: Secondary | ICD-10-CM | POA: Diagnosis not present

## 2017-08-26 DIAGNOSIS — R2681 Unsteadiness on feet: Secondary | ICD-10-CM | POA: Diagnosis not present

## 2017-08-26 DIAGNOSIS — M6281 Muscle weakness (generalized): Secondary | ICD-10-CM | POA: Diagnosis not present

## 2017-08-27 DIAGNOSIS — R2681 Unsteadiness on feet: Secondary | ICD-10-CM | POA: Diagnosis not present

## 2017-08-27 DIAGNOSIS — M6281 Muscle weakness (generalized): Secondary | ICD-10-CM | POA: Diagnosis not present

## 2017-08-27 DIAGNOSIS — R278 Other lack of coordination: Secondary | ICD-10-CM | POA: Diagnosis not present

## 2017-08-29 DIAGNOSIS — R2681 Unsteadiness on feet: Secondary | ICD-10-CM | POA: Diagnosis not present

## 2017-08-29 DIAGNOSIS — M6281 Muscle weakness (generalized): Secondary | ICD-10-CM | POA: Diagnosis not present

## 2017-08-29 DIAGNOSIS — R278 Other lack of coordination: Secondary | ICD-10-CM | POA: Diagnosis not present

## 2017-09-01 DIAGNOSIS — M6281 Muscle weakness (generalized): Secondary | ICD-10-CM | POA: Diagnosis not present

## 2017-09-01 DIAGNOSIS — R2681 Unsteadiness on feet: Secondary | ICD-10-CM | POA: Diagnosis not present

## 2017-09-01 DIAGNOSIS — R278 Other lack of coordination: Secondary | ICD-10-CM | POA: Diagnosis not present

## 2017-09-02 DIAGNOSIS — M6281 Muscle weakness (generalized): Secondary | ICD-10-CM | POA: Diagnosis not present

## 2017-09-02 DIAGNOSIS — R2681 Unsteadiness on feet: Secondary | ICD-10-CM | POA: Diagnosis not present

## 2017-09-02 DIAGNOSIS — R278 Other lack of coordination: Secondary | ICD-10-CM | POA: Diagnosis not present

## 2017-09-04 DIAGNOSIS — M6281 Muscle weakness (generalized): Secondary | ICD-10-CM | POA: Diagnosis not present

## 2017-09-04 DIAGNOSIS — R278 Other lack of coordination: Secondary | ICD-10-CM | POA: Diagnosis not present

## 2017-09-04 DIAGNOSIS — R2681 Unsteadiness on feet: Secondary | ICD-10-CM | POA: Diagnosis not present

## 2017-09-07 DIAGNOSIS — R278 Other lack of coordination: Secondary | ICD-10-CM | POA: Diagnosis not present

## 2017-09-07 DIAGNOSIS — M6281 Muscle weakness (generalized): Secondary | ICD-10-CM | POA: Diagnosis not present

## 2017-09-07 DIAGNOSIS — R2681 Unsteadiness on feet: Secondary | ICD-10-CM | POA: Diagnosis not present

## 2017-09-08 DIAGNOSIS — M6281 Muscle weakness (generalized): Secondary | ICD-10-CM | POA: Diagnosis not present

## 2017-09-08 DIAGNOSIS — R2681 Unsteadiness on feet: Secondary | ICD-10-CM | POA: Diagnosis not present

## 2017-09-08 DIAGNOSIS — R278 Other lack of coordination: Secondary | ICD-10-CM | POA: Diagnosis not present

## 2017-09-09 DIAGNOSIS — R278 Other lack of coordination: Secondary | ICD-10-CM | POA: Diagnosis not present

## 2017-09-09 DIAGNOSIS — R2681 Unsteadiness on feet: Secondary | ICD-10-CM | POA: Diagnosis not present

## 2017-09-09 DIAGNOSIS — M6281 Muscle weakness (generalized): Secondary | ICD-10-CM | POA: Diagnosis not present

## 2017-09-10 DIAGNOSIS — R2681 Unsteadiness on feet: Secondary | ICD-10-CM | POA: Diagnosis not present

## 2017-09-10 DIAGNOSIS — M6281 Muscle weakness (generalized): Secondary | ICD-10-CM | POA: Diagnosis not present

## 2017-09-10 DIAGNOSIS — R278 Other lack of coordination: Secondary | ICD-10-CM | POA: Diagnosis not present

## 2017-09-11 DIAGNOSIS — M6281 Muscle weakness (generalized): Secondary | ICD-10-CM | POA: Diagnosis not present

## 2017-09-11 DIAGNOSIS — R2681 Unsteadiness on feet: Secondary | ICD-10-CM | POA: Diagnosis not present

## 2017-09-11 DIAGNOSIS — R278 Other lack of coordination: Secondary | ICD-10-CM | POA: Diagnosis not present

## 2017-09-12 DIAGNOSIS — M6281 Muscle weakness (generalized): Secondary | ICD-10-CM | POA: Diagnosis not present

## 2017-09-12 DIAGNOSIS — R2681 Unsteadiness on feet: Secondary | ICD-10-CM | POA: Diagnosis not present

## 2017-09-12 DIAGNOSIS — R278 Other lack of coordination: Secondary | ICD-10-CM | POA: Diagnosis not present

## 2017-09-15 DIAGNOSIS — R278 Other lack of coordination: Secondary | ICD-10-CM | POA: Diagnosis not present

## 2017-09-15 DIAGNOSIS — R2681 Unsteadiness on feet: Secondary | ICD-10-CM | POA: Diagnosis not present

## 2017-09-15 DIAGNOSIS — M6281 Muscle weakness (generalized): Secondary | ICD-10-CM | POA: Diagnosis not present

## 2017-09-16 DIAGNOSIS — R2681 Unsteadiness on feet: Secondary | ICD-10-CM | POA: Diagnosis not present

## 2017-09-16 DIAGNOSIS — R278 Other lack of coordination: Secondary | ICD-10-CM | POA: Diagnosis not present

## 2017-09-16 DIAGNOSIS — M6281 Muscle weakness (generalized): Secondary | ICD-10-CM | POA: Diagnosis not present

## 2017-09-18 DIAGNOSIS — R2681 Unsteadiness on feet: Secondary | ICD-10-CM | POA: Diagnosis not present

## 2017-09-18 DIAGNOSIS — R278 Other lack of coordination: Secondary | ICD-10-CM | POA: Diagnosis not present

## 2017-09-18 DIAGNOSIS — M6281 Muscle weakness (generalized): Secondary | ICD-10-CM | POA: Diagnosis not present

## 2017-09-19 DIAGNOSIS — R2681 Unsteadiness on feet: Secondary | ICD-10-CM | POA: Diagnosis not present

## 2017-09-19 DIAGNOSIS — R278 Other lack of coordination: Secondary | ICD-10-CM | POA: Diagnosis not present

## 2017-09-19 DIAGNOSIS — M6281 Muscle weakness (generalized): Secondary | ICD-10-CM | POA: Diagnosis not present

## 2017-09-22 DIAGNOSIS — F0151 Vascular dementia with behavioral disturbance: Secondary | ICD-10-CM | POA: Diagnosis not present

## 2017-09-22 DIAGNOSIS — I10 Essential (primary) hypertension: Secondary | ICD-10-CM | POA: Diagnosis not present

## 2017-09-22 DIAGNOSIS — R278 Other lack of coordination: Secondary | ICD-10-CM | POA: Diagnosis not present

## 2017-09-22 DIAGNOSIS — M6281 Muscle weakness (generalized): Secondary | ICD-10-CM | POA: Diagnosis not present

## 2017-09-22 DIAGNOSIS — J069 Acute upper respiratory infection, unspecified: Secondary | ICD-10-CM | POA: Diagnosis not present

## 2017-09-22 DIAGNOSIS — E119 Type 2 diabetes mellitus without complications: Secondary | ICD-10-CM | POA: Diagnosis not present

## 2017-09-22 DIAGNOSIS — R2681 Unsteadiness on feet: Secondary | ICD-10-CM | POA: Diagnosis not present

## 2017-09-23 DIAGNOSIS — M6281 Muscle weakness (generalized): Secondary | ICD-10-CM | POA: Diagnosis not present

## 2017-09-23 DIAGNOSIS — R2681 Unsteadiness on feet: Secondary | ICD-10-CM | POA: Diagnosis not present

## 2017-09-23 DIAGNOSIS — R278 Other lack of coordination: Secondary | ICD-10-CM | POA: Diagnosis not present

## 2017-09-24 DIAGNOSIS — M79674 Pain in right toe(s): Secondary | ICD-10-CM | POA: Diagnosis not present

## 2017-09-24 DIAGNOSIS — R278 Other lack of coordination: Secondary | ICD-10-CM | POA: Diagnosis not present

## 2017-09-24 DIAGNOSIS — B351 Tinea unguium: Secondary | ICD-10-CM | POA: Diagnosis not present

## 2017-09-24 DIAGNOSIS — M6281 Muscle weakness (generalized): Secondary | ICD-10-CM | POA: Diagnosis not present

## 2017-09-24 DIAGNOSIS — R2681 Unsteadiness on feet: Secondary | ICD-10-CM | POA: Diagnosis not present

## 2017-09-24 DIAGNOSIS — M79675 Pain in left toe(s): Secondary | ICD-10-CM | POA: Diagnosis not present

## 2017-09-25 DIAGNOSIS — R2681 Unsteadiness on feet: Secondary | ICD-10-CM | POA: Diagnosis not present

## 2017-09-25 DIAGNOSIS — M6281 Muscle weakness (generalized): Secondary | ICD-10-CM | POA: Diagnosis not present

## 2017-09-25 DIAGNOSIS — R278 Other lack of coordination: Secondary | ICD-10-CM | POA: Diagnosis not present

## 2017-09-26 DIAGNOSIS — R2681 Unsteadiness on feet: Secondary | ICD-10-CM | POA: Diagnosis not present

## 2017-09-26 DIAGNOSIS — M6281 Muscle weakness (generalized): Secondary | ICD-10-CM | POA: Diagnosis not present

## 2017-09-26 DIAGNOSIS — R278 Other lack of coordination: Secondary | ICD-10-CM | POA: Diagnosis not present

## 2017-09-29 ENCOUNTER — Other Ambulatory Visit: Payer: Self-pay

## 2017-09-29 ENCOUNTER — Emergency Department (HOSPITAL_COMMUNITY)
Admission: EM | Admit: 2017-09-29 | Discharge: 2017-09-30 | Disposition: A | Discharge status: Home / Self Care | Payer: Medicare Other | Attending: Emergency Medicine | Admitting: Emergency Medicine

## 2017-09-29 ENCOUNTER — Emergency Department (HOSPITAL_COMMUNITY): Payer: Medicare Other

## 2017-09-29 ENCOUNTER — Encounter (HOSPITAL_COMMUNITY): Payer: Self-pay | Admitting: Emergency Medicine

## 2017-09-29 DIAGNOSIS — F0390 Unspecified dementia without behavioral disturbance: Secondary | ICD-10-CM | POA: Insufficient documentation

## 2017-09-29 DIAGNOSIS — R402411 Glasgow coma scale score 13-15, in the field [EMT or ambulance]: Secondary | ICD-10-CM | POA: Diagnosis not present

## 2017-09-29 DIAGNOSIS — M6281 Muscle weakness (generalized): Secondary | ICD-10-CM | POA: Diagnosis not present

## 2017-09-29 DIAGNOSIS — R0902 Hypoxemia: Secondary | ICD-10-CM | POA: Diagnosis present

## 2017-09-29 DIAGNOSIS — R2681 Unsteadiness on feet: Secondary | ICD-10-CM | POA: Diagnosis not present

## 2017-09-29 DIAGNOSIS — Z794 Long term (current) use of insulin: Secondary | ICD-10-CM | POA: Insufficient documentation

## 2017-09-29 DIAGNOSIS — I10 Essential (primary) hypertension: Secondary | ICD-10-CM | POA: Diagnosis not present

## 2017-09-29 DIAGNOSIS — F17210 Nicotine dependence, cigarettes, uncomplicated: Secondary | ICD-10-CM | POA: Diagnosis not present

## 2017-09-29 DIAGNOSIS — Z7982 Long term (current) use of aspirin: Secondary | ICD-10-CM | POA: Insufficient documentation

## 2017-09-29 DIAGNOSIS — J189 Pneumonia, unspecified organism: Secondary | ICD-10-CM | POA: Diagnosis not present

## 2017-09-29 DIAGNOSIS — Z79899 Other long term (current) drug therapy: Secondary | ICD-10-CM | POA: Insufficient documentation

## 2017-09-29 DIAGNOSIS — R0602 Shortness of breath: Secondary | ICD-10-CM | POA: Diagnosis not present

## 2017-09-29 DIAGNOSIS — E1140 Type 2 diabetes mellitus with diabetic neuropathy, unspecified: Secondary | ICD-10-CM | POA: Diagnosis not present

## 2017-09-29 DIAGNOSIS — R278 Other lack of coordination: Secondary | ICD-10-CM | POA: Diagnosis not present

## 2017-09-29 LAB — CBC
HCT: 34.2 % — ABNORMAL LOW (ref 39.0–52.0)
Hemoglobin: 10.3 g/dL — ABNORMAL LOW (ref 13.0–17.0)
MCH: 28.9 pg (ref 26.0–34.0)
MCHC: 30.1 g/dL (ref 30.0–36.0)
MCV: 96.1 fL (ref 78.0–100.0)
Platelets: 218 K/uL (ref 150–400)
RBC: 3.56 MIL/uL — ABNORMAL LOW (ref 4.22–5.81)
RDW: 13.3 % (ref 11.5–15.5)
WBC: 11.4 K/uL — ABNORMAL HIGH (ref 4.0–10.5)

## 2017-09-29 LAB — I-STAT TROPONIN, ED: Troponin i, poc: 0 ng/mL (ref 0.00–0.08)

## 2017-09-29 LAB — BASIC METABOLIC PANEL
ANION GAP: 12 (ref 5–15)
BUN: 31 mg/dL — ABNORMAL HIGH (ref 8–23)
CHLORIDE: 106 mmol/L (ref 98–111)
CO2: 25 mmol/L (ref 22–32)
Calcium: 9.6 mg/dL (ref 8.9–10.3)
Creatinine, Ser: 1.75 mg/dL — ABNORMAL HIGH (ref 0.61–1.24)
GFR calc non Af Amer: 38 mL/min — ABNORMAL LOW (ref >60)
GFR, EST AFRICAN AMERICAN: 44 mL/min — AB (ref >60)
Glucose, Bld: 86 mg/dL (ref 70–99)
Potassium: 4.7 mmol/L (ref 3.5–5.1)
Sodium: 143 mmol/L (ref 135–145)

## 2017-09-29 MED ORDER — LEVOFLOXACIN 750 MG PO TABS
750.0000 mg | ORAL_TABLET | Freq: Once | ORAL | Status: AC
  Administered 2017-09-29: 750 mg via ORAL
  Filled 2017-09-29: qty 1

## 2017-09-29 MED ORDER — LEVOFLOXACIN 750 MG PO TABS: 750.0000 mg | ORAL_TABLET | Freq: Every day | ORAL | 0 refills | Status: DC

## 2017-09-29 NOTE — ED Provider Notes (Signed)
[place] EMERGENCY DEPARTMENT Provider Note   CSN: [id number] Arrival date & time: 09/29/17  1635     History   Chief Complaint Chief Complaint  Patient presents with  . Hypoxemia    HPI Manuel ELMORE Sr. is a 72 y.o. male.  HPI  Information is obtained from EMS and the patient's daughter who had gotten description from the nursing home.  Patient has dementia and does not give any history.  EMS report was that the patient's oxygen level was checked and was found to be 46%.  Upon their arrival patient had 98% reading and no distress.  And no complaints.  Per the patient's daughter, she was told that he was doing his physical therapy and had chest congestion.  She reports they measured an oxygen saturation of 71%.  She has not noted him to have any symptoms recently.  She reports sometimes if his food is not pured enough he does have problems with aspiration.  Is not had any recent documented fever.  She reports that she has been with him in the emergency department he seems well.  He has been at baseline and asking for food. Past Medical History:  Diagnosis Date  . Anemia   . Diabetes ([place])   . GERD (gastroesophageal reflux disease)   . Hyperlipidemia   . Hypertension   . Leukemia [facility])     Patient Active Problem List   Diagnosis Date Noted  . Acute pancreatitis without infection or necrosis   . Hypothermia 04/10/2017  . AKI (acute kidney injury) ([place]) 04/10/2017  . Hyperkalemia 04/10/2017  . Acute metabolic encephalopathy 76/28/3151  . Dementia 04/10/2017  . Sepsis ([place]) 04/10/2017  . HCAP (healthcare-associated pneumonia) 04/10/2017  . Rhabdomyolysis 11/14/2016  . Multiple lacunar infarcts 11/06/2016  . Small vessel disease, cerebrovascular 11/06/2016  . Syncope 09/13/2016  . Dermatitis 03/01/2016  . Anemia, pernicious 07/04/2015  . Hip pain 07/04/2015  . Aortic valve sclerosis 02/19/2015  . Chronic lymphocytic leukemia ([place]) 02/19/2015  . DM  type 2 with diabetic peripheral neuropathy ([place]) 02/19/2015  . Essential (primary) hypertension 02/19/2015  . DM type 2 with diabetic mixed hyperlipidemia ([place]) 02/19/2015  . Diabetic peripheral neuropathy associated with type 2 diabetes mellitus ([place]) 02/19/2015  . Bleeding ulcer 02/19/2015  . Peripheral vascular disease ([place]) 02/19/2015  . 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 Encounter for screening for malignant neoplasm of prostate 02/19/2015  . Type 2 diabetes mellitus with stage 3 chronic kidney disease ([place]) 02/19/2015  . Compulsive tobacco user syndrome 02/19/2015  . 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  . Aortic valve defect 12/22/2012  . 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[place]) 11/13/2011    Past Surgical History:  Procedure Laterality Date  . CATARACT EXTRACTION W/PHACO Left 09/13/2014   Procedure: CATARACT EXTRACTION PHACO AND INTRAOCULAR LENS PLACEMENT (IOC);  Surgeon: Birder Robson, MD;  Location: [facility] ORS;  Service: Ophthalmology;  Laterality: Left;  [place] 01:03   . COLONOSCOPY  2014  . POPLITEAL ARTERY ANGIOPLASTY Left 2014  . PTCA Right    leg  . UPPER GASTROINTESTINAL ENDOSCOPY  2014        Home Medications    Prior to Admission medications   Medication Sig Start Date End Date Taking? Authorizing Provider  acetaminophen (TYLENOL) 500 MG tablet Take 500 mg by mouth every 4 (four) hours as needed for mild pain, fever or headache. NOT TO EXCEED 2000 MG IN 24 HOURS   Yes [provider]  alum & mag hydroxide-simeth (MINTOX) 811-914-78 MG/5ML suspension Take 30 mLs by mouth every 6 (six) hours as needed for indigestion or heartburn. NOT TO EXCEED 4 DOSES IN 24 HOURS   Yes [provider]  amLODipine (NORVASC) 10 MG tablet Take 10 mg by mouth daily.   Yes [provider]  aspirin EC 81 MG tablet Take 81 mg by mouth daily.   Yes [provider]  atorvastatin (LIPITOR) 10 MG tablet Take  10 mg by mouth daily.   Yes [provider]  gabapentin (NEURONTIN) 300 MG capsule TAKE 1 CAPSULE BY MOUTH 3  TIMES DAILY Patient taking differently: TAKE 300 mg BY MOUTH 3  TIMES DAILY 08/30/16  Yes [name], MD  guaiFENesin (ROBITUSSIN) 100 MG/5ML liquid Take 200 mg by mouth 3 (three) times daily as needed for cough.   Yes [provider]  hydrALAZINE (APRESOLINE) 25 MG tablet Take 25 mg by mouth 3 (three) times daily.   Yes [provider]  insulin glargine (LANTUS) 100 unit/mL SOPN Inject 14 Units into the skin every morning.   Yes [provider]  insulin lispro (HUMALOG KWIKPEN) 100 UNIT/ML KiwkPen Inject 3 Units into the skin 3 (three) times daily. Hold if meal time BS is 100 or less. Call MD if BS greater than 450   Yes [provider]  loperamide (IMODIUM) 2 MG capsule Take 2 mg by mouth as needed for diarrhea or loose stools. DO NOT EXCEED 8 DOSES IN 24 HOURS   Yes [provider]  magnesium hydroxide (MILK OF MAGNESIA) 400 MG/5ML suspension Take 30 mLs by mouth at bedtime as needed for mild constipation or moderate constipation.   Yes [provider]  metFORMIN (GLUCOPHAGE) 500 MG tablet Take 1 tablet (500 mg total) by mouth 2 (two) times daily with a meal. 04/16/16 09/29/17 Yes [name], MD  Multiple Vitamin (MULTIVITAMIN WITH MINERALS) TABS tablet Take 1 tablet by mouth daily.   Yes [provider]  mupirocin ointment (BACTROBAN) 2 % Place 1 application into the nose 3 (three) times daily.   Yes [provider]  neomycin-bacitracin-polymyxin (NEOSPORIN) ointment Apply 1 application topically every 8 (eight) hours as needed for wound care.    Yes [provider]  PARoxetine (PAXIL) 20 MG tablet Take 20 mg by mouth daily.   Yes [provider]  QUEtiapine (SEROQUEL) 25 MG tablet Take 1 tablet (25 mg total) by mouth at bedtime. Patient taking differently: Take 25 mg by mouth daily.   04/15/17  Yes [name], MD  vitamin C (ASCORBIC ACID) 250 MG tablet Take 500 mg by mouth 2 (two) times daily.   Yes [provider]  amLODipine (NORVASC) 10 MG tablet Take 1 tablet (10 mg total) by mouth daily. Patient not taking: Reported on 09/29/2017 04/15/17   [name], MD  aspirin 81 MG tablet Take 1 tablet (81 mg total) by mouth daily. Patient not taking: Reported on 09/29/2017 08/14/16   [name], MD  enoxaparin (LOVENOX) 40 MG/0.4ML injection Inject 0.4 mLs (40 mg total) into the skin daily. Patient not taking: Reported on 06/17/2017 04/15/17   [name], MD  glucose blood test strip Use as instructed Patient not taking: Reported on 09/29/2017 03/06/15   [name], MD  hydrALAZINE (APRESOLINE) 50 MG tablet Take 1.5 tablets (75 mg total) by mouth every 8 (eight) hours. 04/15/17   [name], MD  insulin aspart (NOVOLOG) 100 UNIT/ML  injection Inject 0-9 Units into the skin 4 (four) times daily -  before meals and at bedtime. Patient not taking: Reported on 06/17/2017 04/15/17   [name], MD  insulin glargine (LANTUS) 100 UNIT/ML injection Inject 0.12 mLs (12 Units total) into the skin daily. Patient taking differently: Inject 14 Units into the skin daily.  04/16/17   [name], MD  ipratropium-albuterol (DUONEB) 0.5-2.5 (3) MG/3ML SOLN Take 3 mLs by nebulization every 2 (two) hours as needed. Patient not taking: Reported on 09/29/2017 04/15/17   [name], MD  Lancets 28G MISC 1 each by Does not apply route daily. 03/06/15   [name], MD  levofloxacin (LEVAQUIN) 750 MG tablet Take 1 tablet (750 mg total) by mouth daily. X 7 days 09/29/17   Manuel Shanks, MD  simvastatin (ZOCOR) 20 MG tablet TAKE 1 TABLET BY MOUTH AT  BEDTIME Patient not taking: Reported on 06/17/2017 08/30/16   [name], MD  triamcinolone cream (KENALOG) 0.1 % APPLY TO AFFECTED AREA TWICE A DAY Patient not taking: Reported on 09/29/2017  08/07/16   Daleen Bo, MD    Family History Family History  Problem Relation Age of Onset  . Diabetes Mother   . Diabetes Father     Social History Social History   Tobacco Use  . Smoking status: Current Every Day Smoker    Packs/day: 0.50    Types: Cigarettes  . Smokeless tobacco: Never Used  Substance Use Topics  . Alcohol use: No    Alcohol/week: 0.0 oz  . Drug use: No     Allergies   Patient has no known allergies.   Review of Systems Review of Systems Level 5 caveat cannot obtain review of systems due to dementia.  Physical Exam Updated Vital Signs BP (!) 141/77   Pulse 69   Temp 97.6 F (36.4 C) (Oral)   Resp (!) 23   SpO2 95%   Physical Exam  Constitutional: He appears well-developed and well-nourished.  Patient is in no distress.  He is alert.  He does interact and answer questions but speech is somewhat mumbling and repetitive without useful content..  No respiratory distress.  HENT:  Head: Normocephalic and atraumatic.  Mouth/Throat: Oropharynx is clear and moist.  Eyes: Pupils are equal, round, and reactive to light. EOM are normal.  Neck: Neck supple.  Cardiovascular: Normal rate, regular rhythm, normal heart sounds and intact distal pulses.  Pulmonary/Chest: Effort normal and breath sounds normal.  Abdominal: Soft. Bowel sounds are normal. He exhibits no distension. There is no tenderness.  Musculoskeletal: Normal range of motion. He exhibits no edema or tenderness.  Neurological: He is alert. He has normal strength. Coordination normal. GCS eye subscore is 4. GCS verbal subscore is 5. GCS motor subscore is 6.  Skin: Skin is warm, dry and intact.  Psychiatric: He has a normal mood and affect.     ED Treatments / Results  Labs (all labs ordered are listed, but only abnormal results are displayed) Labs Reviewed  BASIC METABOLIC PANEL - Abnormal; Notable for the following components:      Result Value   BUN 31 (*)    Creatinine, Ser 1.75  (*)    GFR calc non Af Amer 38 (*)    GFR calc Af Amer 44 (*)    All other components within normal limits  CBC - Abnormal; Notable for the following components:   WBC 11.4 (*)    RBC 3.56 (*)  Hemoglobin 10.3 (*)    HCT 34.2 (*)    All other components within normal limits  I-STAT TROPONIN, ED    EKG EKG Interpretation  Date/Time:  Monday September 29 2017 16:41:46 EDT Ventricular Rate:  62 PR Interval:    QRS Duration: 136 QT Interval:  447 QTC Calculation: 454 R Axis:   65 Text Interpretation:  Sinus rhythm Right bundle branch block no cahnge from previous Confirmed by Manuel Randolph [phone]) on 09/29/2017 6:27:26 PM   Radiology Dg Chest 2 View  Result Date: 09/29/2017 CLINICAL DATA:  72 year old male with shortness of breath. EXAM: CHEST - 2 VIEW COMPARISON:  Chest radiographs 04/12/2017 and earlier. FINDINGS: Continued low but slightly lower lung volumes. Patchy left lung base opacity. No pleural effusion, pneumothorax or other acute pulmonary opacity. Increased pulmonary interstitium, but may be related to crowding. Calcified aortic atherosclerosis. Mediastinal contours remain within normal limits. Visualized tracheal air column is within normal limits. Negative visible bowel gas pattern. No acute osseous abnormality identified. IMPRESSION: Lower lung volumes with increased interstitial opacity and patchy left lung base opacity which might all reflect atelectasis, but consider the possibility of acute viral or atypical respiratory infection. Electronically Signed   By: Genevie Ann M.D.   On: 09/29/2017 18:45    Procedures Procedures (including critical care time)  Medications Ordered in ED Medications  levofloxacin (LEVAQUIN) tablet 750 mg (has no administration in time range)     Initial Impression / Assessment and Plan / ED Course  I have reviewed the triage vital signs and the nursing notes.  Pertinent labs & imaging results that were available during my care of the patient  were reviewed by me and considered in my medical decision making (see chart for details).     Final Clinical Impressions(s) / ED Diagnoses   Final diagnoses:  HCAP (healthcare-associated pneumonia)   Patient is referred to the emergency department for hypoxia.  His vital signs have been normal throughout his stay in the emergency department.  It seems that the readings of 46% or 71% is the case may be, could have been machine errors.  Patient has had no hypoxia not required any oxygen.  Objective appearance is well and at baseline.  Chest x-ray suggest possible infiltrate although atelectasis is possible as well.  However, and the combination with some leukocytosis, hospitalization for pneumonia earlier this year, possible issues of aspiration and possible hypoxia, will opt to treat with healthcare associated pneumonia with patient being a nursing home patient.  Is stable for return to nursing home care and monitoring. ED Discharge Orders        Ordered    levofloxacin (LEVAQUIN) 750 MG tablet  Daily     09/29/17 2016       Manuel Shanks, MD 09/29/17 2028

## 2017-09-29 NOTE — ED Notes (Signed)
Patient transported to X-ray 

## 2017-09-29 NOTE — Discharge Instructions (Addendum)
Patient's vital signs stable throughout his stay in the emergency department.  No hypoxia or hypotension.  No respiratory distress. Chest x-ray shows possible infiltrate and patient has mild leukocytosis.  Will institute treatment with Levaquin for findings.  Continue to observe the patient for any difficulty breathing or hypoxia.

## 2017-09-29 NOTE — ED Notes (Signed)
[facility] called for transport.  

## 2017-09-29 NOTE — ED Triage Notes (Signed)
Patient arrived to ED via [facility] from [place]. EMS reports:  EMS called by staff reporting that patient's oxygen level 46%. When EMS arrived, pulse ox reading 96% on room air. No respiratory distress. Clear lungs. No complaints.  VSS. CBG 148.

## 2017-09-30 DIAGNOSIS — R2681 Unsteadiness on feet: Secondary | ICD-10-CM | POA: Diagnosis not present

## 2017-09-30 DIAGNOSIS — R279 Unspecified lack of coordination: Secondary | ICD-10-CM | POA: Diagnosis not present

## 2017-09-30 DIAGNOSIS — M6281 Muscle weakness (generalized): Secondary | ICD-10-CM | POA: Diagnosis not present

## 2017-09-30 DIAGNOSIS — R278 Other lack of coordination: Secondary | ICD-10-CM | POA: Diagnosis not present

## 2017-09-30 DIAGNOSIS — Z743 Need for continuous supervision: Secondary | ICD-10-CM | POA: Diagnosis not present

## 2017-09-30 NOTE — ED Notes (Signed)
Patient Alert and oriented to baseline. Stable and ambulatory to baseline. Patient verbalized understanding of the discharge instructions.  Patient belongings were taken by [facility].

## 2017-10-01 DIAGNOSIS — R278 Other lack of coordination: Secondary | ICD-10-CM | POA: Diagnosis not present

## 2017-10-01 DIAGNOSIS — M6281 Muscle weakness (generalized): Secondary | ICD-10-CM | POA: Diagnosis not present

## 2017-10-01 DIAGNOSIS — Z79899 Other long term (current) drug therapy: Secondary | ICD-10-CM | POA: Diagnosis not present

## 2017-10-01 DIAGNOSIS — R2681 Unsteadiness on feet: Secondary | ICD-10-CM | POA: Diagnosis not present

## 2017-10-01 DIAGNOSIS — D649 Anemia, unspecified: Secondary | ICD-10-CM | POA: Diagnosis not present

## 2017-10-01 DIAGNOSIS — E119 Type 2 diabetes mellitus without complications: Secondary | ICD-10-CM | POA: Diagnosis not present

## 2017-10-02 DIAGNOSIS — R2681 Unsteadiness on feet: Secondary | ICD-10-CM | POA: Diagnosis not present

## 2017-10-02 DIAGNOSIS — R278 Other lack of coordination: Secondary | ICD-10-CM | POA: Diagnosis not present

## 2017-10-02 DIAGNOSIS — M6281 Muscle weakness (generalized): Secondary | ICD-10-CM | POA: Diagnosis not present

## 2017-10-03 DIAGNOSIS — R2681 Unsteadiness on feet: Secondary | ICD-10-CM | POA: Diagnosis not present

## 2017-10-03 DIAGNOSIS — M6281 Muscle weakness (generalized): Secondary | ICD-10-CM | POA: Diagnosis not present

## 2017-10-03 DIAGNOSIS — R278 Other lack of coordination: Secondary | ICD-10-CM | POA: Diagnosis not present

## 2017-10-04 DIAGNOSIS — M6281 Muscle weakness (generalized): Secondary | ICD-10-CM | POA: Diagnosis not present

## 2017-10-04 DIAGNOSIS — R278 Other lack of coordination: Secondary | ICD-10-CM | POA: Diagnosis not present

## 2017-10-04 DIAGNOSIS — R2681 Unsteadiness on feet: Secondary | ICD-10-CM | POA: Diagnosis not present

## 2017-10-05 NOTE — Progress Notes (Deleted)
[place]  [phone]  [phone]     Manuel STOVALL Sr. DOB: Feb 12, 1946  MR#: [id number]  XHB#:[id number]  Patient Care Team: [name], MD as PCP - General (Family Medicine)  CHIEF COMPLAINT: [name]  INTERVAL HISTORY: Patient returns to clinic today for repeat laboratory and further evaluation. He continues to feel well and remains asymptomatic. He denies any fevers, night sweats, or weight loss. He has no lymphadenopathy. He denies any chest pain or shortness of breath. He has no nausea, vomiting, constipation, or diarrhea. He has no urinary complaints. Patient feels at his baseline and offers no specific complaints today.  REVIEW OF SYSTEMS:   Review of Systems  Constitutional: Negative.  Negative for diaphoresis, fever, malaise/fatigue and weight loss.  Respiratory: Negative.  Negative for cough and shortness of breath.   Cardiovascular: Negative.  Negative for chest pain and leg swelling.  Gastrointestinal: Negative.  Negative for abdominal pain.  Genitourinary: Negative.   Musculoskeletal: Negative.   Skin: Negative.  Negative for rash.  Neurological: Negative.  Negative for sensory change and weakness.  Psychiatric/Behavioral: Negative.  The patient is not nervous/anxious.     As per HPI. Otherwise, a complete review of systems is negative.   PAST MEDICAL HISTORY: Past Medical History:  Diagnosis Date  . Anemia   . Diabetes ([place])   . GERD (gastroesophageal reflux disease)   . Hyperlipidemia   . Hypertension   . Leukemia ([place])     PAST SURGICAL HISTORY: Past Surgical History:  Procedure Laterality Date  . CATARACT EXTRACTION W/PHACO Left 09/13/2014   Procedure: CATARACT EXTRACTION PHACO AND INTRAOCULAR LENS PLACEMENT (IOC);  Surgeon: Birder Robson, MD;  Location: [facility] ORS;  Service: Ophthalmology;  Laterality: Left;  [place] 01:03   . COLONOSCOPY  2014  . POPLITEAL ARTERY ANGIOPLASTY Left 2014  . PTCA Right    leg  .  UPPER GASTROINTESTINAL ENDOSCOPY  2014    FAMILY HISTORY Family History  Problem Relation Age of Onset  . Diabetes Mother   . Diabetes Father      ADVANCED DIRECTIVES:    HEALTH MAINTENANCE: Social History   Tobacco Use  . Smoking status: Current Every Day Smoker    Packs/day: 0.50    Types: Cigarettes  . Smokeless tobacco: Never Used  Substance Use Topics  . Alcohol use: No    Alcohol/week: 0.0 oz  . Drug use: No      No Known Allergies  Current Outpatient Medications  Medication Sig Dispense Refill  . acetaminophen (TYLENOL) 500 MG tablet Take 500 mg by mouth every 4 (four) hours as needed for mild pain, fever or headache. NOT TO EXCEED 2000 MG IN 24 HOURS    . alum & mag hydroxide-simeth (MINTOX) 810-175-10 MG/5ML suspension Take 30 mLs by mouth every 6 (six) hours as needed for indigestion or heartburn. NOT TO EXCEED 4 DOSES IN 24 HOURS    . amLODipine (NORVASC) 10 MG tablet Take 1 tablet (10 mg total) by mouth daily. (Patient not taking: Reported on 09/29/2017) 30 tablet 0  . amLODipine (NORVASC) 10 MG tablet Take 10 mg by mouth daily.    Marland Kitchen aspirin 81 MG tablet Take 1 tablet (81 mg total) by mouth daily. (Patient not taking: Reported on 09/29/2017) 30 tablet 5  . aspirin EC 81 MG tablet Take 81 mg by mouth daily.    Marland Kitchen atorvastatin (LIPITOR) 10 MG tablet Take 10 mg by mouth daily.    Marland Kitchen enoxaparin (LOVENOX) 40 MG/0.4ML  injection Inject 0.4 mLs (40 mg total) into the skin daily. (Patient not taking: Reported on 06/17/2017) 0 Syringe   . gabapentin (NEURONTIN) 300 MG capsule TAKE 1 CAPSULE BY MOUTH 3  TIMES DAILY (Patient taking differently: TAKE 300 mg BY MOUTH 3  TIMES DAILY) 270 capsule 0  . glucose blood test strip Use as instructed (Patient not taking: Reported on 09/29/2017) 100 each 12  . guaiFENesin (ROBITUSSIN) 100 MG/5ML liquid Take 200 mg by mouth 3 (three) times daily as needed for cough.    . hydrALAZINE (APRESOLINE) 25 MG tablet Take 25 mg by mouth 3 (three) times  daily.    . hydrALAZINE (APRESOLINE) 50 MG tablet Take 1.5 tablets (75 mg total) by mouth every 8 (eight) hours. 90 tablet 0  . insulin aspart (NOVOLOG) 100 UNIT/ML injection Inject 0-9 Units into the skin 4 (four) times daily -  before meals and at bedtime. (Patient not taking: Reported on 06/17/2017) 10 mL 11  . insulin glargine (LANTUS) 100 UNIT/ML injection Inject 0.12 mLs (12 Units total) into the skin daily. (Patient taking differently: Inject 14 Units into the skin daily. ) 10 mL 0  . insulin glargine (LANTUS) 100 unit/mL SOPN Inject 14 Units into the skin every morning.    . insulin lispro (HUMALOG KWIKPEN) 100 UNIT/ML KiwkPen Inject 3 Units into the skin 3 (three) times daily. Hold if meal time BS is 100 or less. Call MD if BS greater than 450    . ipratropium-albuterol (DUONEB) 0.5-2.5 (3) MG/3ML SOLN Take 3 mLs by nebulization every 2 (two) hours as needed. (Patient not taking: Reported on 09/29/2017) 360 mL 0  . Lancets 28G MISC 1 each by Does not apply route daily. 100 each 3  . levofloxacin (LEVAQUIN) 750 MG tablet Take 1 tablet (750 mg total) by mouth daily. X 7 days 7 tablet 0  . loperamide (IMODIUM) 2 MG capsule Take 2 mg by mouth as needed for diarrhea or loose stools. DO NOT EXCEED 8 DOSES IN 24 HOURS    . magnesium hydroxide (MILK OF MAGNESIA) 400 MG/5ML suspension Take 30 mLs by mouth at bedtime as needed for mild constipation or moderate constipation.    . metFORMIN (GLUCOPHAGE) 500 MG tablet Take 1 tablet (500 mg total) by mouth 2 (two) times daily with a meal. 180 tablet 1  . Multiple Vitamin (MULTIVITAMIN WITH MINERALS) TABS tablet Take 1 tablet by mouth daily.    . mupirocin ointment (BACTROBAN) 2 % Place 1 application into the nose 3 (three) times daily.    Marland Kitchen neomycin-bacitracin-polymyxin (NEOSPORIN) ointment Apply 1 application topically every 8 (eight) hours as needed for wound care.     Marland Kitchen PARoxetine (PAXIL) 20 MG tablet Take 20 mg by mouth daily.    . QUEtiapine (SEROQUEL)  25 MG tablet Take 1 tablet (25 mg total) by mouth at bedtime. (Patient taking differently: Take 25 mg by mouth daily. ) 15 tablet 0  . simvastatin (ZOCOR) 20 MG tablet TAKE 1 TABLET BY MOUTH AT  BEDTIME (Patient not taking: Reported on 06/17/2017) 90 tablet 0  . triamcinolone cream (KENALOG) 0.1 % APPLY TO AFFECTED AREA TWICE A DAY (Patient not taking: Reported on 09/29/2017) 120 g 0  . vitamin C (ASCORBIC ACID) 250 MG tablet Take 500 mg by mouth 2 (two) times daily.     No current facility-administered medications for this visit.     OBJECTIVE: There were no vitals taken for this visit.   There is no height or weight  on file to calculate BMI.    ECOG FS:0 - Asymptomatic  General: Well-developed, well-nourished, no acute distress. Eyes: Pink conjunctiva, anicteric sclera. Lungs: Clear to auscultation bilaterally. Heart: Regular rate and rhythm. No rubs, murmurs, or gallops. Abdomen: Soft, nontender, nondistended. No organomegaly noted, normoactive bowel sounds. Musculoskeletal: No edema, cyanosis, or clubbing. Neuro: Alert, answering all questions appropriately. Cranial nerves grossly intact. Skin: No rashes or petechiae noted. Psych: Normal affect.  LAB RESULTS: CBC    Component Value Date/Time   WBC 11.4 (H) 09/29/2017 1846   RBC 3.56 (L) 09/29/2017 1846   HGB 10.3 (L) 09/29/2017 1846   HGB 12.6 (L) 06/15/2013 1128   HCT 34.2 (L) 09/29/2017 1846   HCT 39.6 (L) 06/15/2013 1128   PLT 218 09/29/2017 1846   PLT 141 (L) 06/15/2013 1128   MCV 96.1 09/29/2017 1846   MCV 92 06/15/2013 1128   MCH 28.9 09/29/2017 1846   MCHC 30.1 09/29/2017 1846   RDW 13.3 09/29/2017 1846   RDW 16.2 (H) 06/15/2013 1128   LYMPHSABS 2.4 06/17/2017 2236   LYMPHSABS 9.8 (H) 06/15/2013 1128   MONOABS 0.4 06/17/2017 2236   MONOABS 0.8 06/15/2013 1128   EOSABS 0.3 06/17/2017 2236   EOSABS 0.3 06/15/2013 1128   BASOSABS 0.1 06/17/2017 2236   BASOSABS 0.1 06/15/2013 1128     STUDIES: No results  found.  ASSESSMENT:  [name], Rai stage 0.  PLAN:   1. [name]: Patient was originally diagnosed in 2014 and has been under observation only. His white blood count remains within normal limits, although he does have a noted lymphocytosis. Quantitative immunoglobulins and FISH panel were last performed Nov 2016.  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 Return to clinic in 1 year with repeat laboratory work and further evaluation. 2. Mild thrombocytopenia: Resolved. 3. Elevated creatinine: Appears to be at his baseline, monitor. 4. Anemia: Mild, monitor.   Patient expressed understanding and was in agreement with this plan. He also understands that He can call clinic at any time with any questions, concerns, or complaints.    Lloyd Huger, MD 10/05/17 7:43 AM

## 2017-10-06 ENCOUNTER — Inpatient Hospital Stay: Payer: Medicare Other

## 2017-10-06 ENCOUNTER — Inpatient Hospital Stay: Payer: Medicare Other | Admitting: Oncology

## 2017-10-06 DIAGNOSIS — Z7689 Persons encountering health services in other specified circumstances: Secondary | ICD-10-CM | POA: Diagnosis not present

## 2017-10-06 DIAGNOSIS — R2681 Unsteadiness on feet: Secondary | ICD-10-CM | POA: Diagnosis not present

## 2017-10-06 DIAGNOSIS — F0151 Vascular dementia with behavioral disturbance: Secondary | ICD-10-CM | POA: Diagnosis not present

## 2017-10-06 DIAGNOSIS — E119 Type 2 diabetes mellitus without complications: Secondary | ICD-10-CM | POA: Diagnosis not present

## 2017-10-06 DIAGNOSIS — M6281 Muscle weakness (generalized): Secondary | ICD-10-CM | POA: Diagnosis not present

## 2017-10-06 DIAGNOSIS — R278 Other lack of coordination: Secondary | ICD-10-CM | POA: Diagnosis not present

## 2017-10-06 DIAGNOSIS — N183 Chronic kidney disease, stage 3 (moderate): Secondary | ICD-10-CM | POA: Diagnosis not present

## 2017-10-06 DIAGNOSIS — J189 Pneumonia, unspecified organism: Secondary | ICD-10-CM | POA: Diagnosis not present

## 2017-10-08 DIAGNOSIS — M6281 Muscle weakness (generalized): Secondary | ICD-10-CM | POA: Diagnosis not present

## 2017-10-08 DIAGNOSIS — R2681 Unsteadiness on feet: Secondary | ICD-10-CM | POA: Diagnosis not present

## 2017-10-08 DIAGNOSIS — R278 Other lack of coordination: Secondary | ICD-10-CM | POA: Diagnosis not present

## 2017-10-09 DIAGNOSIS — M6281 Muscle weakness (generalized): Secondary | ICD-10-CM | POA: Diagnosis not present

## 2017-10-09 DIAGNOSIS — R278 Other lack of coordination: Secondary | ICD-10-CM | POA: Diagnosis not present

## 2017-10-09 DIAGNOSIS — R2681 Unsteadiness on feet: Secondary | ICD-10-CM | POA: Diagnosis not present

## 2017-10-10 DIAGNOSIS — M6281 Muscle weakness (generalized): Secondary | ICD-10-CM | POA: Diagnosis not present

## 2017-10-10 DIAGNOSIS — R278 Other lack of coordination: Secondary | ICD-10-CM | POA: Diagnosis not present

## 2017-10-10 DIAGNOSIS — R2681 Unsteadiness on feet: Secondary | ICD-10-CM | POA: Diagnosis not present

## 2017-10-13 DIAGNOSIS — M6281 Muscle weakness (generalized): Secondary | ICD-10-CM | POA: Diagnosis not present

## 2017-10-13 DIAGNOSIS — R2681 Unsteadiness on feet: Secondary | ICD-10-CM | POA: Diagnosis not present

## 2017-10-13 DIAGNOSIS — R278 Other lack of coordination: Secondary | ICD-10-CM | POA: Diagnosis not present

## 2017-10-14 DIAGNOSIS — R2681 Unsteadiness on feet: Secondary | ICD-10-CM | POA: Diagnosis not present

## 2017-10-14 DIAGNOSIS — R278 Other lack of coordination: Secondary | ICD-10-CM | POA: Diagnosis not present

## 2017-10-14 DIAGNOSIS — M6281 Muscle weakness (generalized): Secondary | ICD-10-CM | POA: Diagnosis not present

## 2017-10-15 DIAGNOSIS — R278 Other lack of coordination: Secondary | ICD-10-CM | POA: Diagnosis not present

## 2017-10-15 DIAGNOSIS — M6281 Muscle weakness (generalized): Secondary | ICD-10-CM | POA: Diagnosis not present

## 2017-10-15 DIAGNOSIS — R2681 Unsteadiness on feet: Secondary | ICD-10-CM | POA: Diagnosis not present

## 2017-10-16 DIAGNOSIS — R2681 Unsteadiness on feet: Secondary | ICD-10-CM | POA: Diagnosis not present

## 2017-10-16 DIAGNOSIS — R278 Other lack of coordination: Secondary | ICD-10-CM | POA: Diagnosis not present

## 2017-10-16 DIAGNOSIS — M6281 Muscle weakness (generalized): Secondary | ICD-10-CM | POA: Diagnosis not present

## 2017-10-20 DIAGNOSIS — R2681 Unsteadiness on feet: Secondary | ICD-10-CM | POA: Diagnosis not present

## 2017-10-20 DIAGNOSIS — R278 Other lack of coordination: Secondary | ICD-10-CM | POA: Diagnosis not present

## 2017-10-20 DIAGNOSIS — M6281 Muscle weakness (generalized): Secondary | ICD-10-CM | POA: Diagnosis not present

## 2017-10-21 DIAGNOSIS — R278 Other lack of coordination: Secondary | ICD-10-CM | POA: Diagnosis not present

## 2017-10-21 DIAGNOSIS — R2681 Unsteadiness on feet: Secondary | ICD-10-CM | POA: Diagnosis not present

## 2017-10-21 DIAGNOSIS — M6281 Muscle weakness (generalized): Secondary | ICD-10-CM | POA: Diagnosis not present

## 2017-10-22 DIAGNOSIS — M6281 Muscle weakness (generalized): Secondary | ICD-10-CM | POA: Diagnosis not present

## 2017-10-22 DIAGNOSIS — R2681 Unsteadiness on feet: Secondary | ICD-10-CM | POA: Diagnosis not present

## 2017-10-22 DIAGNOSIS — R278 Other lack of coordination: Secondary | ICD-10-CM | POA: Diagnosis not present

## 2017-10-23 DIAGNOSIS — R278 Other lack of coordination: Secondary | ICD-10-CM | POA: Diagnosis not present

## 2017-10-23 DIAGNOSIS — M6281 Muscle weakness (generalized): Secondary | ICD-10-CM | POA: Diagnosis not present

## 2017-10-23 DIAGNOSIS — R2681 Unsteadiness on feet: Secondary | ICD-10-CM | POA: Diagnosis not present

## 2017-10-27 DIAGNOSIS — R278 Other lack of coordination: Secondary | ICD-10-CM | POA: Diagnosis not present

## 2017-10-27 DIAGNOSIS — M6281 Muscle weakness (generalized): Secondary | ICD-10-CM | POA: Diagnosis not present

## 2017-10-27 DIAGNOSIS — R2681 Unsteadiness on feet: Secondary | ICD-10-CM | POA: Diagnosis not present

## 2017-10-28 DIAGNOSIS — R2681 Unsteadiness on feet: Secondary | ICD-10-CM | POA: Diagnosis not present

## 2017-10-28 DIAGNOSIS — M6281 Muscle weakness (generalized): Secondary | ICD-10-CM | POA: Diagnosis not present

## 2017-10-28 DIAGNOSIS — R278 Other lack of coordination: Secondary | ICD-10-CM | POA: Diagnosis not present

## 2017-10-29 DIAGNOSIS — M6281 Muscle weakness (generalized): Secondary | ICD-10-CM | POA: Diagnosis not present

## 2017-10-29 DIAGNOSIS — R2681 Unsteadiness on feet: Secondary | ICD-10-CM | POA: Diagnosis not present

## 2017-10-29 DIAGNOSIS — R278 Other lack of coordination: Secondary | ICD-10-CM | POA: Diagnosis not present

## 2017-10-30 DIAGNOSIS — R278 Other lack of coordination: Secondary | ICD-10-CM | POA: Diagnosis not present

## 2017-10-30 DIAGNOSIS — M6281 Muscle weakness (generalized): Secondary | ICD-10-CM | POA: Diagnosis not present

## 2017-10-30 DIAGNOSIS — R2681 Unsteadiness on feet: Secondary | ICD-10-CM | POA: Diagnosis not present

## 2017-11-03 DIAGNOSIS — R2681 Unsteadiness on feet: Secondary | ICD-10-CM | POA: Diagnosis not present

## 2017-11-03 DIAGNOSIS — R278 Other lack of coordination: Secondary | ICD-10-CM | POA: Diagnosis not present

## 2017-11-03 DIAGNOSIS — M6281 Muscle weakness (generalized): Secondary | ICD-10-CM | POA: Diagnosis not present

## 2017-11-06 DIAGNOSIS — M6281 Muscle weakness (generalized): Secondary | ICD-10-CM | POA: Diagnosis not present

## 2017-11-06 DIAGNOSIS — R278 Other lack of coordination: Secondary | ICD-10-CM | POA: Diagnosis not present

## 2017-11-06 DIAGNOSIS — R2681 Unsteadiness on feet: Secondary | ICD-10-CM | POA: Diagnosis not present

## 2017-11-07 DIAGNOSIS — R2681 Unsteadiness on feet: Secondary | ICD-10-CM | POA: Diagnosis not present

## 2017-11-07 DIAGNOSIS — M6281 Muscle weakness (generalized): Secondary | ICD-10-CM | POA: Diagnosis not present

## 2017-11-07 DIAGNOSIS — R278 Other lack of coordination: Secondary | ICD-10-CM | POA: Diagnosis not present

## 2017-11-08 DIAGNOSIS — R278 Other lack of coordination: Secondary | ICD-10-CM | POA: Diagnosis not present

## 2017-11-08 DIAGNOSIS — R2681 Unsteadiness on feet: Secondary | ICD-10-CM | POA: Diagnosis not present

## 2017-11-08 DIAGNOSIS — M6281 Muscle weakness (generalized): Secondary | ICD-10-CM | POA: Diagnosis not present

## 2017-11-10 DIAGNOSIS — M6281 Muscle weakness (generalized): Secondary | ICD-10-CM | POA: Diagnosis not present

## 2017-11-10 DIAGNOSIS — R278 Other lack of coordination: Secondary | ICD-10-CM | POA: Diagnosis not present

## 2017-11-10 DIAGNOSIS — R2681 Unsteadiness on feet: Secondary | ICD-10-CM | POA: Diagnosis not present

## 2017-11-11 DIAGNOSIS — R278 Other lack of coordination: Secondary | ICD-10-CM | POA: Diagnosis not present

## 2017-11-11 DIAGNOSIS — R2681 Unsteadiness on feet: Secondary | ICD-10-CM | POA: Diagnosis not present

## 2017-11-11 DIAGNOSIS — M6281 Muscle weakness (generalized): Secondary | ICD-10-CM | POA: Diagnosis not present

## 2017-11-12 DIAGNOSIS — M6281 Muscle weakness (generalized): Secondary | ICD-10-CM | POA: Diagnosis not present

## 2017-11-12 DIAGNOSIS — R278 Other lack of coordination: Secondary | ICD-10-CM | POA: Diagnosis not present

## 2017-11-12 DIAGNOSIS — R2681 Unsteadiness on feet: Secondary | ICD-10-CM | POA: Diagnosis not present

## 2017-11-13 DIAGNOSIS — R278 Other lack of coordination: Secondary | ICD-10-CM | POA: Diagnosis not present

## 2017-11-13 DIAGNOSIS — R2681 Unsteadiness on feet: Secondary | ICD-10-CM | POA: Diagnosis not present

## 2017-11-13 DIAGNOSIS — M6281 Muscle weakness (generalized): Secondary | ICD-10-CM | POA: Diagnosis not present

## 2017-11-14 DIAGNOSIS — R278 Other lack of coordination: Secondary | ICD-10-CM | POA: Diagnosis not present

## 2017-11-14 DIAGNOSIS — M6281 Muscle weakness (generalized): Secondary | ICD-10-CM | POA: Diagnosis not present

## 2017-11-14 DIAGNOSIS — R2681 Unsteadiness on feet: Secondary | ICD-10-CM | POA: Diagnosis not present

## 2017-11-17 DIAGNOSIS — R278 Other lack of coordination: Secondary | ICD-10-CM | POA: Diagnosis not present

## 2017-11-17 DIAGNOSIS — M6281 Muscle weakness (generalized): Secondary | ICD-10-CM | POA: Diagnosis not present

## 2017-11-17 DIAGNOSIS — R2681 Unsteadiness on feet: Secondary | ICD-10-CM | POA: Diagnosis not present

## 2017-11-18 DIAGNOSIS — R2681 Unsteadiness on feet: Secondary | ICD-10-CM | POA: Diagnosis not present

## 2017-11-18 DIAGNOSIS — R278 Other lack of coordination: Secondary | ICD-10-CM | POA: Diagnosis not present

## 2017-11-18 DIAGNOSIS — M6281 Muscle weakness (generalized): Secondary | ICD-10-CM | POA: Diagnosis not present

## 2017-11-19 DIAGNOSIS — R2681 Unsteadiness on feet: Secondary | ICD-10-CM | POA: Diagnosis not present

## 2017-11-19 DIAGNOSIS — M6281 Muscle weakness (generalized): Secondary | ICD-10-CM | POA: Diagnosis not present

## 2017-11-19 DIAGNOSIS — R278 Other lack of coordination: Secondary | ICD-10-CM | POA: Diagnosis not present

## 2017-11-21 DIAGNOSIS — M6281 Muscle weakness (generalized): Secondary | ICD-10-CM | POA: Diagnosis not present

## 2017-11-21 DIAGNOSIS — R278 Other lack of coordination: Secondary | ICD-10-CM | POA: Diagnosis not present

## 2017-11-21 DIAGNOSIS — R2681 Unsteadiness on feet: Secondary | ICD-10-CM | POA: Diagnosis not present

## 2017-11-24 DIAGNOSIS — R278 Other lack of coordination: Secondary | ICD-10-CM | POA: Diagnosis not present

## 2017-11-24 DIAGNOSIS — M6281 Muscle weakness (generalized): Secondary | ICD-10-CM | POA: Diagnosis not present

## 2017-11-24 DIAGNOSIS — R2681 Unsteadiness on feet: Secondary | ICD-10-CM | POA: Diagnosis not present

## 2017-11-25 DIAGNOSIS — M6281 Muscle weakness (generalized): Secondary | ICD-10-CM | POA: Diagnosis not present

## 2017-11-25 DIAGNOSIS — R2681 Unsteadiness on feet: Secondary | ICD-10-CM | POA: Diagnosis not present

## 2017-11-25 DIAGNOSIS — R278 Other lack of coordination: Secondary | ICD-10-CM | POA: Diagnosis not present

## 2017-11-26 DIAGNOSIS — R2681 Unsteadiness on feet: Secondary | ICD-10-CM | POA: Diagnosis not present

## 2017-11-26 DIAGNOSIS — M79674 Pain in right toe(s): Secondary | ICD-10-CM | POA: Diagnosis not present

## 2017-11-26 DIAGNOSIS — M6281 Muscle weakness (generalized): Secondary | ICD-10-CM | POA: Diagnosis not present

## 2017-11-26 DIAGNOSIS — M79675 Pain in left toe(s): Secondary | ICD-10-CM | POA: Diagnosis not present

## 2017-11-26 DIAGNOSIS — R278 Other lack of coordination: Secondary | ICD-10-CM | POA: Diagnosis not present

## 2017-11-26 DIAGNOSIS — B351 Tinea unguium: Secondary | ICD-10-CM | POA: Diagnosis not present

## 2017-11-27 DIAGNOSIS — R2681 Unsteadiness on feet: Secondary | ICD-10-CM | POA: Diagnosis not present

## 2017-11-27 DIAGNOSIS — R278 Other lack of coordination: Secondary | ICD-10-CM | POA: Diagnosis not present

## 2017-11-27 DIAGNOSIS — M6281 Muscle weakness (generalized): Secondary | ICD-10-CM | POA: Diagnosis not present

## 2017-11-28 DIAGNOSIS — M6281 Muscle weakness (generalized): Secondary | ICD-10-CM | POA: Diagnosis not present

## 2017-11-28 DIAGNOSIS — R2681 Unsteadiness on feet: Secondary | ICD-10-CM | POA: Diagnosis not present

## 2017-11-28 DIAGNOSIS — R278 Other lack of coordination: Secondary | ICD-10-CM | POA: Diagnosis not present

## 2017-12-01 DIAGNOSIS — M6281 Muscle weakness (generalized): Secondary | ICD-10-CM | POA: Diagnosis not present

## 2017-12-01 DIAGNOSIS — R278 Other lack of coordination: Secondary | ICD-10-CM | POA: Diagnosis not present

## 2017-12-01 DIAGNOSIS — R2681 Unsteadiness on feet: Secondary | ICD-10-CM | POA: Diagnosis not present

## 2017-12-02 DIAGNOSIS — M6281 Muscle weakness (generalized): Secondary | ICD-10-CM | POA: Diagnosis not present

## 2017-12-02 DIAGNOSIS — R2681 Unsteadiness on feet: Secondary | ICD-10-CM | POA: Diagnosis not present

## 2017-12-02 DIAGNOSIS — R278 Other lack of coordination: Secondary | ICD-10-CM | POA: Diagnosis not present

## 2017-12-03 DIAGNOSIS — E119 Type 2 diabetes mellitus without complications: Secondary | ICD-10-CM | POA: Diagnosis not present

## 2017-12-03 DIAGNOSIS — R278 Other lack of coordination: Secondary | ICD-10-CM | POA: Diagnosis not present

## 2017-12-03 DIAGNOSIS — M6281 Muscle weakness (generalized): Secondary | ICD-10-CM | POA: Diagnosis not present

## 2017-12-03 DIAGNOSIS — I10 Essential (primary) hypertension: Secondary | ICD-10-CM | POA: Diagnosis not present

## 2017-12-03 DIAGNOSIS — Z79899 Other long term (current) drug therapy: Secondary | ICD-10-CM | POA: Diagnosis not present

## 2017-12-03 DIAGNOSIS — D649 Anemia, unspecified: Secondary | ICD-10-CM | POA: Diagnosis not present

## 2017-12-03 DIAGNOSIS — E039 Hypothyroidism, unspecified: Secondary | ICD-10-CM | POA: Diagnosis not present

## 2017-12-03 DIAGNOSIS — E782 Mixed hyperlipidemia: Secondary | ICD-10-CM | POA: Diagnosis not present

## 2017-12-03 DIAGNOSIS — E785 Hyperlipidemia, unspecified: Secondary | ICD-10-CM | POA: Diagnosis not present

## 2017-12-03 DIAGNOSIS — R2681 Unsteadiness on feet: Secondary | ICD-10-CM | POA: Diagnosis not present

## 2017-12-04 DIAGNOSIS — M6281 Muscle weakness (generalized): Secondary | ICD-10-CM | POA: Diagnosis not present

## 2017-12-04 DIAGNOSIS — R2681 Unsteadiness on feet: Secondary | ICD-10-CM | POA: Diagnosis not present

## 2017-12-04 DIAGNOSIS — R278 Other lack of coordination: Secondary | ICD-10-CM | POA: Diagnosis not present

## 2017-12-08 DIAGNOSIS — M6281 Muscle weakness (generalized): Secondary | ICD-10-CM | POA: Diagnosis not present

## 2017-12-08 DIAGNOSIS — R278 Other lack of coordination: Secondary | ICD-10-CM | POA: Diagnosis not present

## 2017-12-08 DIAGNOSIS — R2681 Unsteadiness on feet: Secondary | ICD-10-CM | POA: Diagnosis not present

## 2017-12-09 DIAGNOSIS — M6281 Muscle weakness (generalized): Secondary | ICD-10-CM | POA: Diagnosis not present

## 2017-12-09 DIAGNOSIS — R2681 Unsteadiness on feet: Secondary | ICD-10-CM | POA: Diagnosis not present

## 2017-12-09 DIAGNOSIS — R278 Other lack of coordination: Secondary | ICD-10-CM | POA: Diagnosis not present

## 2017-12-10 DIAGNOSIS — R2681 Unsteadiness on feet: Secondary | ICD-10-CM | POA: Diagnosis not present

## 2017-12-10 DIAGNOSIS — R278 Other lack of coordination: Secondary | ICD-10-CM | POA: Diagnosis not present

## 2017-12-10 DIAGNOSIS — M6281 Muscle weakness (generalized): Secondary | ICD-10-CM | POA: Diagnosis not present

## 2017-12-11 DIAGNOSIS — M6281 Muscle weakness (generalized): Secondary | ICD-10-CM | POA: Diagnosis not present

## 2017-12-11 DIAGNOSIS — R278 Other lack of coordination: Secondary | ICD-10-CM | POA: Diagnosis not present

## 2017-12-11 DIAGNOSIS — R2681 Unsteadiness on feet: Secondary | ICD-10-CM | POA: Diagnosis not present

## 2017-12-12 DIAGNOSIS — R2681 Unsteadiness on feet: Secondary | ICD-10-CM | POA: Diagnosis not present

## 2017-12-12 DIAGNOSIS — M6281 Muscle weakness (generalized): Secondary | ICD-10-CM | POA: Diagnosis not present

## 2017-12-12 DIAGNOSIS — R278 Other lack of coordination: Secondary | ICD-10-CM | POA: Diagnosis not present

## 2017-12-15 DIAGNOSIS — M6281 Muscle weakness (generalized): Secondary | ICD-10-CM | POA: Diagnosis not present

## 2017-12-15 DIAGNOSIS — R278 Other lack of coordination: Secondary | ICD-10-CM | POA: Diagnosis not present

## 2017-12-15 DIAGNOSIS — R2681 Unsteadiness on feet: Secondary | ICD-10-CM | POA: Diagnosis not present

## 2017-12-16 DIAGNOSIS — R2681 Unsteadiness on feet: Secondary | ICD-10-CM | POA: Diagnosis not present

## 2017-12-16 DIAGNOSIS — R278 Other lack of coordination: Secondary | ICD-10-CM | POA: Diagnosis not present

## 2017-12-16 DIAGNOSIS — M6281 Muscle weakness (generalized): Secondary | ICD-10-CM | POA: Diagnosis not present

## 2017-12-18 DIAGNOSIS — R278 Other lack of coordination: Secondary | ICD-10-CM | POA: Diagnosis not present

## 2017-12-18 DIAGNOSIS — R2681 Unsteadiness on feet: Secondary | ICD-10-CM | POA: Diagnosis not present

## 2017-12-18 DIAGNOSIS — M6281 Muscle weakness (generalized): Secondary | ICD-10-CM | POA: Diagnosis not present

## 2017-12-19 DIAGNOSIS — R2681 Unsteadiness on feet: Secondary | ICD-10-CM | POA: Diagnosis not present

## 2017-12-19 DIAGNOSIS — M6281 Muscle weakness (generalized): Secondary | ICD-10-CM | POA: Diagnosis not present

## 2017-12-19 DIAGNOSIS — R278 Other lack of coordination: Secondary | ICD-10-CM | POA: Diagnosis not present

## 2017-12-22 DIAGNOSIS — R278 Other lack of coordination: Secondary | ICD-10-CM | POA: Diagnosis not present

## 2017-12-22 DIAGNOSIS — M6281 Muscle weakness (generalized): Secondary | ICD-10-CM | POA: Diagnosis not present

## 2017-12-22 DIAGNOSIS — R2681 Unsteadiness on feet: Secondary | ICD-10-CM | POA: Diagnosis not present

## 2017-12-23 DIAGNOSIS — R278 Other lack of coordination: Secondary | ICD-10-CM | POA: Diagnosis not present

## 2017-12-23 DIAGNOSIS — R2681 Unsteadiness on feet: Secondary | ICD-10-CM | POA: Diagnosis not present

## 2017-12-23 DIAGNOSIS — M6281 Muscle weakness (generalized): Secondary | ICD-10-CM | POA: Diagnosis not present

## 2017-12-24 DIAGNOSIS — M6281 Muscle weakness (generalized): Secondary | ICD-10-CM | POA: Diagnosis not present

## 2017-12-24 DIAGNOSIS — R278 Other lack of coordination: Secondary | ICD-10-CM | POA: Diagnosis not present

## 2017-12-24 DIAGNOSIS — R2681 Unsteadiness on feet: Secondary | ICD-10-CM | POA: Diagnosis not present

## 2017-12-25 DIAGNOSIS — M6281 Muscle weakness (generalized): Secondary | ICD-10-CM | POA: Diagnosis not present

## 2017-12-25 DIAGNOSIS — R278 Other lack of coordination: Secondary | ICD-10-CM | POA: Diagnosis not present

## 2017-12-25 DIAGNOSIS — R2681 Unsteadiness on feet: Secondary | ICD-10-CM | POA: Diagnosis not present

## 2017-12-29 DIAGNOSIS — R2681 Unsteadiness on feet: Secondary | ICD-10-CM | POA: Diagnosis not present

## 2017-12-29 DIAGNOSIS — R278 Other lack of coordination: Secondary | ICD-10-CM | POA: Diagnosis not present

## 2017-12-29 DIAGNOSIS — M6281 Muscle weakness (generalized): Secondary | ICD-10-CM | POA: Diagnosis not present

## 2017-12-30 DIAGNOSIS — R2681 Unsteadiness on feet: Secondary | ICD-10-CM | POA: Diagnosis not present

## 2017-12-30 DIAGNOSIS — M6281 Muscle weakness (generalized): Secondary | ICD-10-CM | POA: Diagnosis not present

## 2017-12-30 DIAGNOSIS — R278 Other lack of coordination: Secondary | ICD-10-CM | POA: Diagnosis not present

## 2017-12-31 DIAGNOSIS — R278 Other lack of coordination: Secondary | ICD-10-CM | POA: Diagnosis not present

## 2017-12-31 DIAGNOSIS — M6281 Muscle weakness (generalized): Secondary | ICD-10-CM | POA: Diagnosis not present

## 2017-12-31 DIAGNOSIS — R2681 Unsteadiness on feet: Secondary | ICD-10-CM | POA: Diagnosis not present

## 2018-01-01 DIAGNOSIS — R2681 Unsteadiness on feet: Secondary | ICD-10-CM | POA: Diagnosis not present

## 2018-01-01 DIAGNOSIS — M6281 Muscle weakness (generalized): Secondary | ICD-10-CM | POA: Diagnosis not present

## 2018-01-01 DIAGNOSIS — R278 Other lack of coordination: Secondary | ICD-10-CM | POA: Diagnosis not present

## 2018-01-02 DIAGNOSIS — R2681 Unsteadiness on feet: Secondary | ICD-10-CM | POA: Diagnosis not present

## 2018-01-02 DIAGNOSIS — R278 Other lack of coordination: Secondary | ICD-10-CM | POA: Diagnosis not present

## 2018-01-02 DIAGNOSIS — M6281 Muscle weakness (generalized): Secondary | ICD-10-CM | POA: Diagnosis not present

## 2018-01-03 DIAGNOSIS — Z23 Encounter for immunization: Secondary | ICD-10-CM | POA: Diagnosis not present

## 2018-01-05 DIAGNOSIS — M6281 Muscle weakness (generalized): Secondary | ICD-10-CM | POA: Diagnosis not present

## 2018-01-05 DIAGNOSIS — R2681 Unsteadiness on feet: Secondary | ICD-10-CM | POA: Diagnosis not present

## 2018-01-05 DIAGNOSIS — R278 Other lack of coordination: Secondary | ICD-10-CM | POA: Diagnosis not present

## 2018-01-06 DIAGNOSIS — M6281 Muscle weakness (generalized): Secondary | ICD-10-CM | POA: Diagnosis not present

## 2018-01-06 DIAGNOSIS — R2681 Unsteadiness on feet: Secondary | ICD-10-CM | POA: Diagnosis not present

## 2018-01-06 DIAGNOSIS — R278 Other lack of coordination: Secondary | ICD-10-CM | POA: Diagnosis not present

## 2018-01-07 DIAGNOSIS — Z79899 Other long term (current) drug therapy: Secondary | ICD-10-CM | POA: Diagnosis not present

## 2018-01-07 DIAGNOSIS — D649 Anemia, unspecified: Secondary | ICD-10-CM | POA: Diagnosis not present

## 2018-01-07 DIAGNOSIS — M6281 Muscle weakness (generalized): Secondary | ICD-10-CM | POA: Diagnosis not present

## 2018-01-07 DIAGNOSIS — R278 Other lack of coordination: Secondary | ICD-10-CM | POA: Diagnosis not present

## 2018-01-07 DIAGNOSIS — E119 Type 2 diabetes mellitus without complications: Secondary | ICD-10-CM | POA: Diagnosis not present

## 2018-01-07 DIAGNOSIS — R2681 Unsteadiness on feet: Secondary | ICD-10-CM | POA: Diagnosis not present

## 2018-01-08 DIAGNOSIS — M6281 Muscle weakness (generalized): Secondary | ICD-10-CM | POA: Diagnosis not present

## 2018-01-08 DIAGNOSIS — R2681 Unsteadiness on feet: Secondary | ICD-10-CM | POA: Diagnosis not present

## 2018-01-08 DIAGNOSIS — R278 Other lack of coordination: Secondary | ICD-10-CM | POA: Diagnosis not present

## 2018-01-12 DIAGNOSIS — R2681 Unsteadiness on feet: Secondary | ICD-10-CM | POA: Diagnosis not present

## 2018-01-12 DIAGNOSIS — R278 Other lack of coordination: Secondary | ICD-10-CM | POA: Diagnosis not present

## 2018-01-12 DIAGNOSIS — M6281 Muscle weakness (generalized): Secondary | ICD-10-CM | POA: Diagnosis not present

## 2018-01-13 DIAGNOSIS — R2681 Unsteadiness on feet: Secondary | ICD-10-CM | POA: Diagnosis not present

## 2018-01-13 DIAGNOSIS — M6281 Muscle weakness (generalized): Secondary | ICD-10-CM | POA: Diagnosis not present

## 2018-01-13 DIAGNOSIS — R278 Other lack of coordination: Secondary | ICD-10-CM | POA: Diagnosis not present

## 2018-01-14 DIAGNOSIS — R278 Other lack of coordination: Secondary | ICD-10-CM | POA: Diagnosis not present

## 2018-01-14 DIAGNOSIS — R2681 Unsteadiness on feet: Secondary | ICD-10-CM | POA: Diagnosis not present

## 2018-01-14 DIAGNOSIS — M6281 Muscle weakness (generalized): Secondary | ICD-10-CM | POA: Diagnosis not present

## 2018-01-19 DIAGNOSIS — M6281 Muscle weakness (generalized): Secondary | ICD-10-CM | POA: Diagnosis not present

## 2018-01-19 DIAGNOSIS — R278 Other lack of coordination: Secondary | ICD-10-CM | POA: Diagnosis not present

## 2018-01-19 DIAGNOSIS — R2681 Unsteadiness on feet: Secondary | ICD-10-CM | POA: Diagnosis not present

## 2018-01-21 DIAGNOSIS — R2681 Unsteadiness on feet: Secondary | ICD-10-CM | POA: Diagnosis not present

## 2018-01-21 DIAGNOSIS — M6281 Muscle weakness (generalized): Secondary | ICD-10-CM | POA: Diagnosis not present

## 2018-01-21 DIAGNOSIS — R278 Other lack of coordination: Secondary | ICD-10-CM | POA: Diagnosis not present

## 2018-01-22 DIAGNOSIS — R278 Other lack of coordination: Secondary | ICD-10-CM | POA: Diagnosis not present

## 2018-01-22 DIAGNOSIS — R2681 Unsteadiness on feet: Secondary | ICD-10-CM | POA: Diagnosis not present

## 2018-01-22 DIAGNOSIS — M6281 Muscle weakness (generalized): Secondary | ICD-10-CM | POA: Diagnosis not present

## 2018-01-23 DIAGNOSIS — R278 Other lack of coordination: Secondary | ICD-10-CM | POA: Diagnosis not present

## 2018-01-23 DIAGNOSIS — M6281 Muscle weakness (generalized): Secondary | ICD-10-CM | POA: Diagnosis not present

## 2018-01-23 DIAGNOSIS — R2681 Unsteadiness on feet: Secondary | ICD-10-CM | POA: Diagnosis not present

## 2018-01-25 DIAGNOSIS — R278 Other lack of coordination: Secondary | ICD-10-CM | POA: Diagnosis not present

## 2018-01-25 DIAGNOSIS — M6281 Muscle weakness (generalized): Secondary | ICD-10-CM | POA: Diagnosis not present

## 2018-01-25 DIAGNOSIS — R2681 Unsteadiness on feet: Secondary | ICD-10-CM | POA: Diagnosis not present

## 2018-01-26 DIAGNOSIS — R278 Other lack of coordination: Secondary | ICD-10-CM | POA: Diagnosis not present

## 2018-01-26 DIAGNOSIS — M6281 Muscle weakness (generalized): Secondary | ICD-10-CM | POA: Diagnosis not present

## 2018-01-26 DIAGNOSIS — R2681 Unsteadiness on feet: Secondary | ICD-10-CM | POA: Diagnosis not present

## 2018-01-27 DIAGNOSIS — R2681 Unsteadiness on feet: Secondary | ICD-10-CM | POA: Diagnosis not present

## 2018-01-27 DIAGNOSIS — R278 Other lack of coordination: Secondary | ICD-10-CM | POA: Diagnosis not present

## 2018-01-27 DIAGNOSIS — M6281 Muscle weakness (generalized): Secondary | ICD-10-CM | POA: Diagnosis not present

## 2018-01-28 DIAGNOSIS — M79675 Pain in left toe(s): Secondary | ICD-10-CM | POA: Diagnosis not present

## 2018-01-28 DIAGNOSIS — M79674 Pain in right toe(s): Secondary | ICD-10-CM | POA: Diagnosis not present

## 2018-01-28 DIAGNOSIS — B351 Tinea unguium: Secondary | ICD-10-CM | POA: Diagnosis not present

## 2018-01-29 DIAGNOSIS — M6281 Muscle weakness (generalized): Secondary | ICD-10-CM | POA: Diagnosis not present

## 2018-01-29 DIAGNOSIS — R2681 Unsteadiness on feet: Secondary | ICD-10-CM | POA: Diagnosis not present

## 2018-01-29 DIAGNOSIS — R278 Other lack of coordination: Secondary | ICD-10-CM | POA: Diagnosis not present

## 2018-02-02 DIAGNOSIS — R278 Other lack of coordination: Secondary | ICD-10-CM | POA: Diagnosis not present

## 2018-02-02 DIAGNOSIS — M6281 Muscle weakness (generalized): Secondary | ICD-10-CM | POA: Diagnosis not present

## 2018-02-04 DIAGNOSIS — M6281 Muscle weakness (generalized): Secondary | ICD-10-CM | POA: Diagnosis not present

## 2018-02-04 DIAGNOSIS — R278 Other lack of coordination: Secondary | ICD-10-CM | POA: Diagnosis not present

## 2018-02-05 DIAGNOSIS — M6281 Muscle weakness (generalized): Secondary | ICD-10-CM | POA: Diagnosis not present

## 2018-02-05 DIAGNOSIS — R278 Other lack of coordination: Secondary | ICD-10-CM | POA: Diagnosis not present

## 2018-02-06 DIAGNOSIS — M6281 Muscle weakness (generalized): Secondary | ICD-10-CM | POA: Diagnosis not present

## 2018-02-06 DIAGNOSIS — R278 Other lack of coordination: Secondary | ICD-10-CM | POA: Diagnosis not present

## 2018-02-10 DIAGNOSIS — M6281 Muscle weakness (generalized): Secondary | ICD-10-CM | POA: Diagnosis not present

## 2018-02-10 DIAGNOSIS — R278 Other lack of coordination: Secondary | ICD-10-CM | POA: Diagnosis not present

## 2018-02-12 DIAGNOSIS — M6281 Muscle weakness (generalized): Secondary | ICD-10-CM | POA: Diagnosis not present

## 2018-02-12 DIAGNOSIS — R278 Other lack of coordination: Secondary | ICD-10-CM | POA: Diagnosis not present

## 2018-02-13 DIAGNOSIS — M6281 Muscle weakness (generalized): Secondary | ICD-10-CM | POA: Diagnosis not present

## 2018-02-13 DIAGNOSIS — R278 Other lack of coordination: Secondary | ICD-10-CM | POA: Diagnosis not present

## 2018-02-16 DIAGNOSIS — M6281 Muscle weakness (generalized): Secondary | ICD-10-CM | POA: Diagnosis not present

## 2018-02-16 DIAGNOSIS — R278 Other lack of coordination: Secondary | ICD-10-CM | POA: Diagnosis not present

## 2018-02-17 DIAGNOSIS — M6281 Muscle weakness (generalized): Secondary | ICD-10-CM | POA: Diagnosis not present

## 2018-02-17 DIAGNOSIS — R278 Other lack of coordination: Secondary | ICD-10-CM | POA: Diagnosis not present

## 2018-02-19 DIAGNOSIS — R278 Other lack of coordination: Secondary | ICD-10-CM | POA: Diagnosis not present

## 2018-02-19 DIAGNOSIS — M6281 Muscle weakness (generalized): Secondary | ICD-10-CM | POA: Diagnosis not present

## 2018-02-22 DIAGNOSIS — R278 Other lack of coordination: Secondary | ICD-10-CM | POA: Diagnosis not present

## 2018-02-22 DIAGNOSIS — M6281 Muscle weakness (generalized): Secondary | ICD-10-CM | POA: Diagnosis not present

## 2018-02-24 DIAGNOSIS — M6281 Muscle weakness (generalized): Secondary | ICD-10-CM | POA: Diagnosis not present

## 2018-02-24 DIAGNOSIS — R278 Other lack of coordination: Secondary | ICD-10-CM | POA: Diagnosis not present

## 2018-02-27 DIAGNOSIS — R278 Other lack of coordination: Secondary | ICD-10-CM | POA: Diagnosis not present

## 2018-02-27 DIAGNOSIS — M6281 Muscle weakness (generalized): Secondary | ICD-10-CM | POA: Diagnosis not present

## 2018-03-02 DIAGNOSIS — R278 Other lack of coordination: Secondary | ICD-10-CM | POA: Diagnosis not present

## 2018-03-02 DIAGNOSIS — F0151 Vascular dementia with behavioral disturbance: Secondary | ICD-10-CM | POA: Diagnosis not present

## 2018-03-02 DIAGNOSIS — J189 Pneumonia, unspecified organism: Secondary | ICD-10-CM | POA: Diagnosis not present

## 2018-03-02 DIAGNOSIS — M6281 Muscle weakness (generalized): Secondary | ICD-10-CM | POA: Diagnosis not present

## 2018-03-02 DIAGNOSIS — N183 Chronic kidney disease, stage 3 (moderate): Secondary | ICD-10-CM | POA: Diagnosis not present

## 2018-03-02 DIAGNOSIS — E119 Type 2 diabetes mellitus without complications: Secondary | ICD-10-CM | POA: Diagnosis not present

## 2018-03-02 DIAGNOSIS — J069 Acute upper respiratory infection, unspecified: Secondary | ICD-10-CM | POA: Diagnosis not present

## 2018-03-03 DIAGNOSIS — M6281 Muscle weakness (generalized): Secondary | ICD-10-CM | POA: Diagnosis not present

## 2018-03-03 DIAGNOSIS — R278 Other lack of coordination: Secondary | ICD-10-CM | POA: Diagnosis not present

## 2018-03-05 DIAGNOSIS — M6281 Muscle weakness (generalized): Secondary | ICD-10-CM | POA: Diagnosis not present

## 2018-03-05 DIAGNOSIS — R278 Other lack of coordination: Secondary | ICD-10-CM | POA: Diagnosis not present

## 2018-03-09 DIAGNOSIS — R278 Other lack of coordination: Secondary | ICD-10-CM | POA: Diagnosis not present

## 2018-03-09 DIAGNOSIS — M6281 Muscle weakness (generalized): Secondary | ICD-10-CM | POA: Diagnosis not present

## 2018-03-10 DIAGNOSIS — R278 Other lack of coordination: Secondary | ICD-10-CM | POA: Diagnosis not present

## 2018-03-10 DIAGNOSIS — M6281 Muscle weakness (generalized): Secondary | ICD-10-CM | POA: Diagnosis not present

## 2018-03-12 DIAGNOSIS — R278 Other lack of coordination: Secondary | ICD-10-CM | POA: Diagnosis not present

## 2018-03-12 DIAGNOSIS — M6281 Muscle weakness (generalized): Secondary | ICD-10-CM | POA: Diagnosis not present

## 2018-03-16 DIAGNOSIS — R278 Other lack of coordination: Secondary | ICD-10-CM | POA: Diagnosis not present

## 2018-03-16 DIAGNOSIS — M6281 Muscle weakness (generalized): Secondary | ICD-10-CM | POA: Diagnosis not present

## 2018-03-18 DIAGNOSIS — R278 Other lack of coordination: Secondary | ICD-10-CM | POA: Diagnosis not present

## 2018-03-18 DIAGNOSIS — M6281 Muscle weakness (generalized): Secondary | ICD-10-CM | POA: Diagnosis not present

## 2018-03-19 DIAGNOSIS — M6281 Muscle weakness (generalized): Secondary | ICD-10-CM | POA: Diagnosis not present

## 2018-03-19 DIAGNOSIS — R278 Other lack of coordination: Secondary | ICD-10-CM | POA: Diagnosis not present

## 2018-03-22 DIAGNOSIS — M6281 Muscle weakness (generalized): Secondary | ICD-10-CM | POA: Diagnosis not present

## 2018-03-22 DIAGNOSIS — R278 Other lack of coordination: Secondary | ICD-10-CM | POA: Diagnosis not present

## 2018-03-26 ENCOUNTER — Emergency Department (HOSPITAL_COMMUNITY)
Admission: EM | Admit: 2018-03-26 | Discharge: 2018-03-26 | Disposition: A | Discharge status: Home / Self Care | Payer: Medicare Other | Attending: Emergency Medicine | Admitting: Emergency Medicine

## 2018-03-26 ENCOUNTER — Emergency Department (HOSPITAL_COMMUNITY): Payer: Medicare Other

## 2018-03-26 DIAGNOSIS — R279 Unspecified lack of coordination: Secondary | ICD-10-CM | POA: Diagnosis not present

## 2018-03-26 DIAGNOSIS — K5900 Constipation, unspecified: Secondary | ICD-10-CM

## 2018-03-26 DIAGNOSIS — I10 Essential (primary) hypertension: Secondary | ICD-10-CM | POA: Insufficient documentation

## 2018-03-26 DIAGNOSIS — Z955 Presence of coronary angioplasty implant and graft: Secondary | ICD-10-CM | POA: Diagnosis not present

## 2018-03-26 DIAGNOSIS — E785 Hyperlipidemia, unspecified: Secondary | ICD-10-CM | POA: Insufficient documentation

## 2018-03-26 DIAGNOSIS — F17210 Nicotine dependence, cigarettes, uncomplicated: Secondary | ICD-10-CM | POA: Diagnosis not present

## 2018-03-26 DIAGNOSIS — R1084 Generalized abdominal pain: Secondary | ICD-10-CM | POA: Diagnosis not present

## 2018-03-26 DIAGNOSIS — F0390 Unspecified dementia without behavioral disturbance: Secondary | ICD-10-CM | POA: Insufficient documentation

## 2018-03-26 DIAGNOSIS — N179 Acute kidney failure, unspecified: Secondary | ICD-10-CM

## 2018-03-26 DIAGNOSIS — N400 Enlarged prostate without lower urinary tract symptoms: Secondary | ICD-10-CM

## 2018-03-26 DIAGNOSIS — R339 Retention of urine, unspecified: Secondary | ICD-10-CM

## 2018-03-26 DIAGNOSIS — E1169 Type 2 diabetes mellitus with other specified complication: Secondary | ICD-10-CM | POA: Insufficient documentation

## 2018-03-26 DIAGNOSIS — Z794 Long term (current) use of insulin: Secondary | ICD-10-CM | POA: Diagnosis not present

## 2018-03-26 DIAGNOSIS — Z743 Need for continuous supervision: Secondary | ICD-10-CM | POA: Diagnosis not present

## 2018-03-26 DIAGNOSIS — Z79899 Other long term (current) drug therapy: Secondary | ICD-10-CM | POA: Insufficient documentation

## 2018-03-26 DIAGNOSIS — R404 Transient alteration of awareness: Secondary | ICD-10-CM | POA: Diagnosis not present

## 2018-03-26 DIAGNOSIS — R0902 Hypoxemia: Secondary | ICD-10-CM | POA: Diagnosis not present

## 2018-03-26 DIAGNOSIS — R52 Pain, unspecified: Secondary | ICD-10-CM | POA: Diagnosis not present

## 2018-03-26 DIAGNOSIS — E1140 Type 2 diabetes mellitus with diabetic neuropathy, unspecified: Secondary | ICD-10-CM | POA: Insufficient documentation

## 2018-03-26 DIAGNOSIS — I959 Hypotension, unspecified: Secondary | ICD-10-CM | POA: Diagnosis not present

## 2018-03-26 LAB — URINALYSIS, ROUTINE W REFLEX MICROSCOPIC
Bilirubin Urine: NEGATIVE
GLUCOSE, UA: NEGATIVE mg/dL
Hgb urine dipstick: NEGATIVE
KETONES UR: NEGATIVE mg/dL
LEUKOCYTES UA: NEGATIVE
Nitrite: NEGATIVE
Protein, ur: 30 mg/dL — AB
SPECIFIC GRAVITY, URINE: 1.008 (ref 1.005–1.030)
pH: 7 (ref 5.0–8.0)

## 2018-03-26 LAB — COMPREHENSIVE METABOLIC PANEL
ALK PHOS: 103 U/L (ref 38–126)
ALT: 15 U/L (ref 0–44)
AST: 20 U/L (ref 15–41)
Albumin: 3.9 g/dL (ref 3.5–5.0)
Anion gap: 11 (ref 5–15)
BUN: 29 mg/dL — AB (ref 8–23)
CALCIUM: 9.5 mg/dL (ref 8.9–10.3)
CO2: 23 mmol/L (ref 22–32)
CREATININE: 2.11 mg/dL — AB (ref 0.61–1.24)
Chloride: 110 mmol/L (ref 98–111)
GFR calc non Af Amer: 31 mL/min — ABNORMAL LOW (ref >60)
GFR, EST AFRICAN AMERICAN: 35 mL/min — AB (ref >60)
GLUCOSE: 105 mg/dL — AB (ref 70–99)
Potassium: 5.2 mmol/L — ABNORMAL HIGH (ref 3.5–5.1)
SODIUM: 144 mmol/L (ref 135–145)
Total Bilirubin: 0.6 mg/dL (ref 0.3–1.2)
Total Protein: 7.5 g/dL (ref 6.5–8.1)

## 2018-03-26 LAB — CBC
HEMATOCRIT: 34.8 % — AB (ref 39.0–52.0)
Hemoglobin: 10.1 g/dL — ABNORMAL LOW (ref 13.0–17.0)
MCH: 29 pg (ref 26.0–34.0)
MCHC: 29 g/dL — AB (ref 30.0–36.0)
MCV: 100 fL (ref 80.0–100.0)
NRBC: 0 % (ref 0.0–0.2)
PLATELETS: 188 10*3/uL (ref 150–400)
RBC: 3.48 MIL/uL — ABNORMAL LOW (ref 4.22–5.81)
RDW: 13.9 % (ref 11.5–15.5)
WBC: 7.9 10*3/uL (ref 4.0–10.5)

## 2018-03-26 LAB — LIPASE, BLOOD: Lipase: 45 U/L (ref 11–51)

## 2018-03-26 MED ORDER — POLYETHYLENE GLYCOL 3350 17 G PO PACK: 17.0000 g | PACK | Freq: Every day | ORAL | 0 refills | Status: DC | PRN

## 2018-03-26 MED ORDER — LACTATED RINGERS IV BOLUS
1000.0000 mL | Freq: Once | INTRAVENOUS | Status: AC
  Administered 2018-03-26: 1000 mL via INTRAVENOUS

## 2018-03-26 MED ORDER — DOCUSATE SODIUM 100 MG PO CAPS
100.0000 mg | ORAL_CAPSULE | Freq: Two times a day (BID) | ORAL | 0 refills | Status: DC
Start: 2018-03-26 — End: 2018-12-03

## 2018-03-26 NOTE — ED Provider Notes (Signed)
[facility] DEPT Provider Note   CSN: [id number] Arrival date & time: 03/26/18  6767     History   Chief Complaint Chief Complaint  Patient presents with  . Abdominal Pain    HPI Manuel WILLINGER Sr. is a 72 y.o. male.  The history is provided by the EMS personnel. History limited by: dementia.  Abdominal Pain   This is a new problem. The problem occurs constantly. The problem has been resolved. The pain is located in the generalized abdominal region. The pain is moderate. Nothing aggravates the symptoms. Nothing relieves the symptoms.    Past Medical History:  Diagnosis Date  . Anemia   . Diabetes ([place])   . GERD (gastroesophageal reflux disease)   . Hyperlipidemia   . Hypertension   . Leukemia [facility])     Patient Active Problem List   Diagnosis Date Noted  . Acute pancreatitis without infection or necrosis   . Hypothermia 04/10/2017  . AKI (acute kidney injury) ([place]) 04/10/2017  . Hyperkalemia 04/10/2017  . Acute metabolic encephalopathy 20/94/7096  . Dementia ([place]) 04/10/2017  . Sepsis ([place]) 04/10/2017  . HCAP (healthcare-associated pneumonia) 04/10/2017  . Rhabdomyolysis 11/14/2016  . Multiple lacunar infarcts 11/06/2016  . Small vessel disease, cerebrovascular 11/06/2016  . Syncope 09/13/2016  . Dermatitis 03/01/2016  . Anemia, pernicious 07/04/2015  . Hip pain 07/04/2015  . Aortic valve sclerosis 02/19/2015  . Chronic lymphocytic leukemia ([place]) 02/19/2015  . DM type 2 with diabetic peripheral neuropathy ([place]) 02/19/2015  . Essential (primary) hypertension 02/19/2015  . DM type 2 with diabetic mixed hyperlipidemia ([place]) 02/19/2015  . Diabetic peripheral neuropathy associated with type 2 diabetes mellitus ([place]) 02/19/2015  . Bleeding ulcer 02/19/2015  . Peripheral vascular disease ([place]) 02/19/2015  . 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 Encounter for screening for malignant neoplasm of prostate 02/19/2015    . Type 2 diabetes mellitus with stage 3 chronic kidney disease ([place]) 02/19/2015  . Compulsive tobacco user syndrome 02/19/2015  . 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  . Aortic valve defect 12/22/2012  . 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[place]) 11/13/2011    Past Surgical History:  Procedure Laterality Date  . CATARACT EXTRACTION W/PHACO Left 09/13/2014   Procedure: CATARACT EXTRACTION PHACO AND INTRAOCULAR LENS PLACEMENT (IOC);  Surgeon: Birder Robson, MD;  Location: [facility] ORS;  Service: Ophthalmology;  Laterality: Left;  [place] 01:03   . COLONOSCOPY  2014  . POPLITEAL ARTERY ANGIOPLASTY Left 2014  . PTCA Right    leg  . UPPER GASTROINTESTINAL ENDOSCOPY  2014        Home Medications    Prior to Admission medications   Medication Sig Start Date End Date Taking? Authorizing Provider  acetaminophen (TYLENOL) 500 MG tablet Take 500 mg by mouth every 4 (four) hours as needed for mild pain, fever or headache. NOT TO EXCEED 2000 MG IN 24 HOURS   Yes [provider]  alum & mag hydroxide-simeth (MINTOX) 283-662-94 MG/5ML suspension Take 30 mLs by mouth every 6 (six) hours as needed for indigestion or heartburn. NOT TO EXCEED 4 DOSES IN 24 HOURS   Yes [provider]  amLODipine (NORVASC) 10 MG tablet Take 1 tablet (10 mg total) by mouth daily. 04/15/17  Yes [name], MD  amLODipine (NORVASC) 10 MG tablet Take 10 mg by mouth daily.   Yes [provider]  aspirin EC 81 MG tablet Take 81 mg by mouth daily.   Yes [provider]  atorvastatin (LIPITOR) 10 MG tablet Take 10 mg by mouth daily.   Yes [provider]  gabapentin (NEURONTIN) 300 MG capsule TAKE 1 CAPSULE BY MOUTH 3  TIMES DAILY Patient taking differently: Take 300 mg by mouth 3 (three) times daily.  08/30/16  Yes [name], MD  guaiFENesin (ROBITUSSIN) 100 MG/5ML liquid Take 200 mg by mouth 3 (three) times daily as needed for cough.   Yes  [provider]  hydrALAZINE (APRESOLINE) 25 MG tablet Take 25 mg by mouth 3 (three) times daily.   Yes [provider]  hydrALAZINE (APRESOLINE) 50 MG tablet Take 1.5 tablets (75 mg total) by mouth every 8 (eight) hours. 04/15/17  Yes [name], MD  insulin glargine (LANTUS) 100 UNIT/ML injection Inject 0.12 mLs (12 Units total) into the skin daily. Patient taking differently: Inject 14 Units into the skin daily.  04/16/17  Yes [name], MD  insulin glargine (LANTUS) 100 unit/mL SOPN Inject 14 Units into the skin every morning.   Yes [provider]  insulin lispro (HUMALOG KWIKPEN) 100 UNIT/ML KiwkPen Inject 3 Units into the skin 3 (three) times daily. Hold if meal time BS is 100 or less. Call MD if BS greater than 450   Yes [provider]  loperamide (IMODIUM) 2 MG capsule Take 2 mg by mouth as needed for diarrhea or loose stools. DO NOT EXCEED 8 DOSES IN 24 HOURS   Yes [provider]  magnesium hydroxide (MILK OF MAGNESIA) 400 MG/5ML suspension Take 30 mLs by mouth at bedtime as needed for mild constipation or moderate constipation.   Yes [provider]  Multiple Vitamin (MULTIVITAMIN WITH MINERALS) TABS tablet Take 1 tablet by mouth daily.   Yes [provider]  mupirocin ointment (BACTROBAN) 2 % Place 1 application into the nose 3 (three) times daily.   Yes [provider]  neomycin-bacitracin-polymyxin (NEOSPORIN) ointment Apply 1 application topically every 8 (eight) hours as needed for wound care.    Yes [provider]  PARoxetine (PAXIL) 20 MG tablet Take 20 mg by mouth daily.   Yes [provider]  QUEtiapine (SEROQUEL) 25 MG tablet Take 1 tablet (25 mg total) by mouth at bedtime. Patient taking differently: Take 25 mg by mouth daily.  04/15/17  Yes [name], MD  triamcinolone cream (KENALOG) 0.1 % APPLY TO AFFECTED AREA TWICE A DAY Patient taking differently: Apply 1  application topically 2 (two) times daily. APPLY TO AFFECTED AREA TWICE A DAY 08/07/16  Yes Daleen Bo, MD  vitamin C (ASCORBIC ACID) 250 MG tablet Take 500 mg by mouth 2 (two) times daily.   Yes [provider]  docusate sodium (COLACE) 100 MG capsule Take 1 capsule (100 mg total) by mouth every 12 (twelve) hours. 03/26/18   Kellianne Ek, Corene Cornea, MD  glucose blood test strip Use as instructed 03/06/15   [name], MD  Lancets 28G MISC 1 each by Does not apply route daily. 03/06/15   [name], MD  polyethylene glycol [facility] / Floria Raveling) packet Take 17 g by mouth daily as needed for moderate constipation. 03/26/18   Calixto Pavel, Corene Cornea, MD    Family History Family History  Problem Relation Age of Onset  . Diabetes Mother   . Diabetes Father     Social History Social History   Tobacco Use  . Smoking status: Current Every Day Smoker    Packs/day: 0.50    Types: Cigarettes  . Smokeless  tobacco: Never Used  Substance Use Topics  . Alcohol use: No    Alcohol/week: 0.0 standard drinks  . Drug use: No     Allergies   Patient has no known allergies.   Review of Systems Review of Systems  Unable to perform ROS: Dementia  Gastrointestinal: Positive for abdominal pain.     Physical Exam Updated Vital Signs BP 115/78 (BP Location: Right Arm)   Pulse 75   Temp 98.3 F (36.8 C) (Oral)   Resp 17   Ht 5\' 8"  (1.727 m)   Wt 81.6 kg   SpO2 100%   BMI 27.37 kg/m   Physical Exam Vitals signs and nursing note reviewed.  Constitutional:      Appearance: He is well-developed.  HENT:     Head: Normocephalic and atraumatic.  Neck:     Musculoskeletal: Normal range of motion.  Cardiovascular:     Rate and Rhythm: Normal rate.  Pulmonary:     Effort: Pulmonary effort is normal. No respiratory distress.  Abdominal:     General: Bowel sounds are normal. There is distension (mild).     Palpations: Abdomen is soft.     Tenderness: There is no abdominal tenderness.      Hernia: No hernia is present.  Musculoskeletal: Normal range of motion.  Neurological:     Mental Status: He is alert.      ED Treatments / Results  Labs (all labs ordered are listed, but only abnormal results are displayed) Labs Reviewed  COMPREHENSIVE METABOLIC PANEL - Abnormal; Notable for the following components:      Result Value   Potassium 5.2 (*)    Glucose, Bld 105 (*)    BUN 29 (*)    Creatinine, Ser 2.11 (*)    GFR calc non Af Amer 31 (*)    GFR calc Af Amer 35 (*)    All other components within normal limits  CBC - Abnormal; Notable for the following components:   RBC 3.48 (*)    Hemoglobin 10.1 (*)    HCT 34.8 (*)    MCHC 29.0 (*)    All other components within normal limits  URINALYSIS, ROUTINE W REFLEX MICROSCOPIC - Abnormal; Notable for the following components:   Protein, ur 30 (*)    Bacteria, UA RARE (*)    All other components within normal limits  LIPASE, BLOOD    EKG None  Radiology Ct Renal Stone Study  Result Date: 03/26/2018 CLINICAL DATA:  Abdominal distension and discomfort. History of dementia. History of chronic lymphocytic leukemia. EXAM: CT ABDOMEN AND PELVIS WITHOUT CONTRAST TECHNIQUE: Multidetector CT imaging of the abdomen and pelvis was performed following the standard protocol without IV contrast. COMPARISON:  CT abdomen and pelvis 04/10/2017. FINDINGS: Lower chest: Right middle lobe subpleural nodule on image 1 measures 0.8 cm, not notably changed. Nodular opacity in the lingula on image 1 measuring 1.7 cm is incompletely imaged. The patient has airspace disease in the lingula on the prior examination so it is difficult to determine the chronicity of this lesion. Bronchiectatic change and patchy airspace disease seen in the lower lobes are unchanged in appearance. No pleural or pericardial effusion. Heart size is normal. Hepatobiliary: Several small stones are seen layering dependently within the gallbladder. There is no evidence of  cholecystitis. The liver and biliary tree appear normal. Pancreas: Unremarkable. No pancreatic ductal dilatation or surrounding inflammatory changes. Spleen: Normal in size without focal abnormality. Adrenals/Urinary Tract: There is mild bilateral hydronephrosis which does  not appear markedly changed. No urinary tract stones are identified. Marked distention of the urinary bladder is seen. The adrenal glands are unremarkable. Stomach/Bowel: A very large stool ball is present in the rectosigmoid colon and appears to compress the prostate. The colon is otherwise unremarkable. The stomach, small bowel and appendix appear normal. Vascular/Lymphatic: Extensive atherosclerotic vascular disease is identified. No lymphadenopathy. Reproductive: There is prostatomegaly. Penile implant is in place with the reservoir in the right lower quadrant adjacent to the bladder. Other: None. Musculoskeletal: T12 compression fracture is unchanged since the prior examination. No lytic or sclerotic lesion. IMPRESSION: Marked distention of the urinary bladder and mild bilateral hydronephrosis most consistent with bladder outlet obstruction in this patient with prostatomegaly. Very large stool ball in the rectosigmoid colon appears to compress the prostate gland. 1.7 cm nodular opacity in left lung base is incompletely imaged. Recommend chest CT without IV contrast in 4-6 weeks for further evaluation. No change in a 0.8 cm right middle lobe subpleural nodule. Bronchiectatic change and patchy airspace disease in the lung bases are chronic and unchanged since the comparison CT. Gallstones without evidence of cholecystitis. Electronically Signed   By: Inge Rise M.D.   On: 03/26/2018 14:01    Procedures Procedures (including critical care time)  Medications Ordered in ED Medications  lactated ringers bolus 1,000 mL (0 mLs Intravenous Stopped 03/26/18 1353)     Initial Impression / Assessment and Plan / ED Course  I have  reviewed the triage vital signs and the nursing notes.  Pertinent labs & imaging results that were available during my care of the patient were reviewed by me and considered in my medical decision making (see chart for details).     eval for distension. No pain at this time. Mild AKI on labs, will give fluids, needs PCP follow up for that. Likely dc back to facility if all normal. Found to have AKI and likely related to prostatomegaly/stool burden. Catheter placed, will need PCP follow up for constipation/AKI and urology follow up for catheter management and enlarged prostate.   Final Clinical Impressions(s) / ED Diagnoses   Final diagnoses:  Constipation, unspecified constipation type  Urinary retention  AKI (acute kidney injury) ([place])  Enlarged prostate    ED Discharge Orders         Ordered    docusate sodium (COLACE) 100 MG capsule  Every 12 hours     03/26/18 1702    polyethylene glycol (MIRALAX / GLYCOLAX) packet  Daily PRN     03/26/18 1702           Lennox Leikam, Corene Cornea, MD 03/27/18 1331

## 2018-03-26 NOTE — ED Notes (Signed)
Patient transported to CT 

## 2018-03-26 NOTE — ED Notes (Signed)
Bed: WA21 Expected date:  Expected time:  Means of arrival:  Comments: EMS-abdominal pain 

## 2018-03-26 NOTE — ED Triage Notes (Signed)
Transported by [facility] from [facility]-- unknown onset of abdominal pain/ distension. Pt has a history of dementia. VSS with EMS. Staff also reported that patient has been more lethargic than usual. CBG read 184 mg/dl.

## 2018-03-26 NOTE — ED Notes (Signed)
[facility] contacted regarding patient transportation.

## 2018-03-26 NOTE — ED Notes (Signed)
Condom cath put in place.

## 2018-03-26 NOTE — ED Notes (Addendum)
Patient unable to sign for discharge. Verbalized permission for this RN to sign for discharge. Attempted to call [facility] to give report but did not get an answer.

## 2018-03-28 ENCOUNTER — Emergency Department (HOSPITAL_COMMUNITY)
Admission: EM | Admit: 2018-03-28 | Discharge: 2018-03-28 | Disposition: A | Discharge status: Home / Self Care | Payer: Medicare Other | Attending: Emergency Medicine | Admitting: Emergency Medicine

## 2018-03-28 ENCOUNTER — Other Ambulatory Visit: Payer: Self-pay

## 2018-03-28 ENCOUNTER — Encounter (HOSPITAL_COMMUNITY): Payer: Self-pay | Admitting: *Deleted

## 2018-03-28 DIAGNOSIS — Z7982 Long term (current) use of aspirin: Secondary | ICD-10-CM | POA: Diagnosis not present

## 2018-03-28 DIAGNOSIS — T83198A Other mechanical complication of other urinary devices and implants, initial encounter: Secondary | ICD-10-CM | POA: Diagnosis not present

## 2018-03-28 DIAGNOSIS — E119 Type 2 diabetes mellitus without complications: Secondary | ICD-10-CM | POA: Diagnosis not present

## 2018-03-28 DIAGNOSIS — R279 Unspecified lack of coordination: Secondary | ICD-10-CM | POA: Diagnosis not present

## 2018-03-28 DIAGNOSIS — R278 Other lack of coordination: Secondary | ICD-10-CM | POA: Diagnosis not present

## 2018-03-28 DIAGNOSIS — R5381 Other malaise: Secondary | ICD-10-CM | POA: Diagnosis not present

## 2018-03-28 DIAGNOSIS — R4182 Altered mental status, unspecified: Secondary | ICD-10-CM | POA: Diagnosis not present

## 2018-03-28 DIAGNOSIS — Z79899 Other long term (current) drug therapy: Secondary | ICD-10-CM | POA: Diagnosis not present

## 2018-03-28 DIAGNOSIS — I10 Essential (primary) hypertension: Secondary | ICD-10-CM | POA: Diagnosis not present

## 2018-03-28 DIAGNOSIS — Z466 Encounter for fitting and adjustment of urinary device: Secondary | ICD-10-CM | POA: Diagnosis not present

## 2018-03-28 DIAGNOSIS — R339 Retention of urine, unspecified: Secondary | ICD-10-CM | POA: Diagnosis not present

## 2018-03-28 DIAGNOSIS — R52 Pain, unspecified: Secondary | ICD-10-CM | POA: Diagnosis not present

## 2018-03-28 DIAGNOSIS — F0390 Unspecified dementia without behavioral disturbance: Secondary | ICD-10-CM | POA: Insufficient documentation

## 2018-03-28 DIAGNOSIS — Z743 Need for continuous supervision: Secondary | ICD-10-CM | POA: Diagnosis not present

## 2018-03-28 DIAGNOSIS — Z794 Long term (current) use of insulin: Secondary | ICD-10-CM | POA: Diagnosis not present

## 2018-03-28 DIAGNOSIS — R410 Disorientation, unspecified: Secondary | ICD-10-CM | POA: Diagnosis not present

## 2018-03-28 DIAGNOSIS — F17210 Nicotine dependence, cigarettes, uncomplicated: Secondary | ICD-10-CM | POA: Diagnosis not present

## 2018-03-28 DIAGNOSIS — M6281 Muscle weakness (generalized): Secondary | ICD-10-CM | POA: Diagnosis not present

## 2018-03-28 NOTE — ED Triage Notes (Signed)
Pt BIB EMS pulled foley out that was placed yesterday. Pt resides at     120/68-72- 97% RA - 16 -CBG 112 PT RESIDES AT [facility].

## 2018-03-28 NOTE — ED Notes (Signed)
Bed: HE03 Expected date:  Expected time:  Means of arrival:  Comments: EMS/pulled foley out

## 2018-03-28 NOTE — Discharge Instructions (Addendum)
Foley replaced 

## 2018-03-28 NOTE — ED Notes (Signed)
[facility] called for transport back to [facility].  

## 2018-03-28 NOTE — ED Notes (Signed)
Cath draining urine without noted blood.

## 2018-03-28 NOTE — ED Notes (Signed)
Called facility to give report, spoke to [facility] who transferred call, no one would answer.

## 2018-03-28 NOTE — ED Provider Notes (Signed)
[facility] DEPT Provider Note   CSN: [id number] Arrival date & time: 03/28/18  4268     History   Chief Complaint Chief Complaint  Patient presents with  . Foley Pulled Out    HPI Manuel CHIRICO Sr. is a 72 y.o. male.  Patient with history of dementia, hypertension, high cholesterol who presents the ED for Foley replacement.  Patient currently in nursing home and had Foley placed for urinary retention recently.  Patient pulled out the Foley.  Overall is well-appearing.  No complaints.  Patient eating food upon my arrival.   The history is provided by the nursing home and the EMS personnel.  Illness  This is a recurrent problem. The current episode started less than 1 hour ago. The problem occurs constantly. The problem has not changed since onset.Nothing aggravates the symptoms. Nothing relieves the symptoms. The treatment provided no relief.    Past Medical History:  Diagnosis Date  . Anemia   . Diabetes ([place])   . GERD (gastroesophageal reflux disease)   . Hyperlipidemia   . Hypertension   . Leukemia [facility])     Patient Active Problem List   Diagnosis Date Noted  . Acute pancreatitis without infection or necrosis   . Hypothermia 04/10/2017  . AKI (acute kidney injury) ([place]) 04/10/2017  . Hyperkalemia 04/10/2017  . Acute metabolic encephalopathy 34/19/6222  . Dementia ([place]) 04/10/2017  . Sepsis ([place]) 04/10/2017  . HCAP (healthcare-associated pneumonia) 04/10/2017  . Rhabdomyolysis 11/14/2016  . Multiple lacunar infarcts 11/06/2016  . Small vessel disease, cerebrovascular 11/06/2016  . Syncope 09/13/2016  . Dermatitis 03/01/2016  . Anemia, pernicious 07/04/2015  . Hip pain 07/04/2015  . Aortic valve sclerosis 02/19/2015  . Chronic lymphocytic leukemia ([place]) 02/19/2015  . DM type 2 with diabetic peripheral neuropathy ([place]) 02/19/2015  . Essential (primary) hypertension 02/19/2015  . DM type 2 with diabetic mixed hyperlipidemia  ([place]) 02/19/2015  . Diabetic peripheral neuropathy associated with type 2 diabetes mellitus ([place]) 02/19/2015  . Bleeding ulcer 02/19/2015  . Peripheral vascular disease ([place]) 02/19/2015  . 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 Encounter for screening for malignant neoplasm of prostate 02/19/2015  . Type 2 diabetes mellitus with stage 3 chronic kidney disease ([place]) 02/19/2015  . Compulsive tobacco user syndrome 02/19/2015  . 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  . Aortic valve defect 12/22/2012  . 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[place]) 11/13/2011    Past Surgical History:  Procedure Laterality Date  . CATARACT EXTRACTION W/PHACO Left 09/13/2014   Procedure: CATARACT EXTRACTION PHACO AND INTRAOCULAR LENS PLACEMENT (IOC);  Surgeon: Birder Robson, MD;  Location: [facility] ORS;  Service: Ophthalmology;  Laterality: Left;  [place] 01:03   . COLONOSCOPY  2014  . POPLITEAL ARTERY ANGIOPLASTY Left 2014  . PTCA Right    leg  . UPPER GASTROINTESTINAL ENDOSCOPY  2014        Home Medications    Prior to Admission medications   Medication Sig Start Date End Date Taking? Authorizing Provider  acetaminophen (TYLENOL) 500 MG tablet Take 500 mg by mouth every 4 (four) hours as needed for mild pain, fever or headache. NOT TO EXCEED 2000 MG IN 24 HOURS   Yes [provider]  alum & mag hydroxide-simeth (MINTOX) 979-892-11 MG/5ML suspension Take 30 mLs by mouth every 6 (six) hours as needed for indigestion or heartburn. NOT TO EXCEED 4 DOSES IN 24 HOURS   Yes [provider]  amLODipine (NORVASC) 10 MG tablet  Take 1 tablet (10 mg total) by mouth daily. 04/15/17  Yes [name], MD  aspirin EC 81 MG tablet Take 81 mg by mouth daily.   Yes [provider]  atorvastatin (LIPITOR) 10 MG tablet Take 10 mg by mouth at bedtime.    Yes [provider]  docusate sodium (COLACE) 100 MG capsule Take 1 capsule (100 mg total) by  mouth every 12 (twelve) hours. 03/26/18  Yes Mesner, Corene Cornea, MD  gabapentin (NEURONTIN) 300 MG capsule TAKE 1 CAPSULE BY MOUTH 3  TIMES DAILY Patient taking differently: Take 300 mg by mouth 3 (three) times daily.  08/30/16  Yes [name], MD  glucose blood test strip Use as instructed 03/06/15  Yes [name], MD  guaiFENesin (ROBITUSSIN) 100 MG/5ML liquid Take 200 mg by mouth 3 (three) times daily as needed for cough.   Yes [provider]  hydrALAZINE (APRESOLINE) 25 MG tablet Take 25 mg by mouth 3 (three) times daily.   Yes [provider]  insulin glargine (LANTUS) 100 UNIT/ML injection Inject 0.12 mLs (12 Units total) into the skin daily. Patient taking differently: Inject 14 Units into the skin daily.  04/16/17  Yes [name], MD  insulin lispro (HUMALOG KWIKPEN) 100 UNIT/ML KiwkPen Inject 3 Units into the skin 3 (three) times daily. Hold if meal time BS is 100 or less. Call MD if BS greater than 450   Yes [provider]  Lancets 28G MISC 1 each by Does not apply route daily. 03/06/15  Yes [name], MD  loperamide (IMODIUM) 2 MG capsule Take 2 mg by mouth as needed for diarrhea or loose stools. DO NOT EXCEED 8 DOSES IN 24 HOURS   Yes [provider]  magnesium hydroxide (MILK OF MAGNESIA) 400 MG/5ML suspension Take 30 mLs by mouth at bedtime as needed for mild constipation or moderate constipation.   Yes [provider]  Multiple Vitamin (MULTIVITAMIN WITH MINERALS) TABS tablet Take 1 tablet by mouth daily.   Yes [provider]  mupirocin ointment (BACTROBAN) 2 % Place 1 application into the nose 3 (three) times daily.   Yes [provider]  neomycin-bacitracin-polymyxin (NEOSPORIN) ointment Apply 1 application topically every 8 (eight) hours as needed for wound care.    Yes [provider]  PARoxetine (PAXIL) 20 MG tablet Take 20 mg by mouth daily.   Yes [provider]  polyethylene  glycol (MIRALAX / GLYCOLAX) packet Take 17 g by mouth daily as needed for moderate constipation. 03/26/18  Yes Mesner, Corene Cornea, MD  QUEtiapine (SEROQUEL) 25 MG tablet Take 1 tablet (25 mg total) by mouth at bedtime. Patient taking differently: Take 25 mg by mouth daily.  04/15/17  Yes [name], MD  triamcinolone cream (KENALOG) 0.1 % APPLY TO AFFECTED AREA TWICE A DAY Patient taking differently: Apply 1 application topically 2 (two) times daily. APPLY TO AFFECTED AREA TWICE A DAY 08/07/16  Yes Daleen Bo, MD  hydrALAZINE (APRESOLINE) 50 MG tablet Take 1.5 tablets (75 mg total) by mouth every 8 (eight) hours. Patient not taking: Reported on 03/28/2018 04/15/17   [name], MD    Family History Family History  Problem Relation Age of Onset  . Diabetes Mother   . Diabetes Father     Social History Social History   Tobacco Use  . Smoking status: Current Every Day Smoker    Packs/day: 0.50    Types: Cigarettes  . Smokeless tobacco: Never Used  Substance Use Topics  . Alcohol use: No    Alcohol/week: 0.0 standard drinks  . Drug use: No     Allergies   Patient has no known allergies.   Review of Systems Review of Systems  Unable to perform ROS: Dementia     Physical Exam Updated Vital Signs BP (!) 140/51 Comment: Resp 15  Pulse 62 Comment: Resp 15  Temp 98.8 F (37.1 C) (Oral)   Resp 16   Ht 5\' 8"  (1.727 m)   Wt 81.6 kg   SpO2 99% Comment: Resp 15  BMI 27.35 kg/m   Physical Exam Constitutional:      General: He is not in acute distress.    Appearance: He is not ill-appearing or toxic-appearing.  HENT:     Head: Normocephalic and atraumatic.  Abdominal:     General: Abdomen is flat. There is no distension.     Palpations: There is no mass.     Tenderness: There is no abdominal tenderness. There is no guarding or rebound.     Hernia: No hernia is present.  Skin:    Capillary Refill: Capillary refill takes less than 2 seconds.  Neurological:       General: No focal deficit present.     Mental Status: He is alert.      ED Treatments / Results  Labs (all labs ordered are listed, but only abnormal results are displayed) Labs Reviewed - No data to display  EKG None  Radiology Ct Renal Stone Study  Result Date: 03/26/2018 CLINICAL DATA:  Abdominal distension and discomfort. History of dementia. History of chronic lymphocytic leukemia. EXAM: CT ABDOMEN AND PELVIS WITHOUT CONTRAST TECHNIQUE: Multidetector CT imaging of the abdomen and pelvis was performed following the standard protocol without IV contrast. COMPARISON:  CT abdomen and pelvis 04/10/2017. FINDINGS: Lower chest: Right middle lobe subpleural nodule on image 1 measures 0.8 cm, not notably changed. Nodular opacity in the lingula on image 1 measuring 1.7 cm is incompletely imaged. The patient has airspace disease in the lingula on the prior examination so it is difficult to determine the chronicity of this lesion. Bronchiectatic change and patchy airspace disease seen in the lower lobes are unchanged in appearance. No pleural or pericardial effusion. Heart size is normal. Hepatobiliary: Several small stones are seen layering dependently within the gallbladder. There is no evidence of cholecystitis. The liver and biliary tree appear normal. Pancreas: Unremarkable. No pancreatic ductal dilatation or surrounding inflammatory changes. Spleen: Normal in size without focal abnormality. Adrenals/Urinary Tract: There is mild bilateral hydronephrosis which does not appear markedly changed. No urinary tract stones are identified. Marked distention of the urinary bladder is seen. The adrenal glands are unremarkable. Stomach/Bowel: A very large stool ball is present in the rectosigmoid colon and appears to compress the prostate. The colon is otherwise unremarkable. The stomach, small bowel and appendix appear normal. Vascular/Lymphatic: Extensive atherosclerotic vascular disease is identified.  No lymphadenopathy. Reproductive: There is prostatomegaly. Penile implant is in place with the reservoir in the right lower quadrant adjacent to the bladder. Other: None. Musculoskeletal: T12 compression fracture is unchanged since the prior examination. No lytic or sclerotic lesion. IMPRESSION: Marked distention of the urinary bladder and mild bilateral hydronephrosis most consistent with bladder outlet obstruction in this patient with prostatomegaly. Very large stool ball in the rectosigmoid colon appears to compress the prostate gland. 1.7 cm nodular opacity in left lung base is incompletely imaged. Recommend chest CT without IV contrast in 4-6 weeks for further evaluation.  No change in a 0.8 cm right middle lobe subpleural nodule. Bronchiectatic change and patchy airspace disease in the lung bases are chronic and unchanged since the comparison CT. Gallstones without evidence of cholecystitis. Electronically Signed   By: Inge Rise M.D.   On: 03/26/2018 14:01    Procedures Procedures (including critical care time)  Medications Ordered in ED Medications - No data to display   Initial Impression / Assessment and Plan / ED Course  I have reviewed the triage vital signs and the nursing notes.  Pertinent labs & imaging results that were available during my care of the patient were reviewed by me and considered in my medical decision making (see chart for details).     Manuel BROOKENS Sr. is a 72 year old male with history of dementia, high cholesterol, hypertension who presents to the ED with Foley issue.  Patient with normal vitals.  No fever.  Patient pulled out his Foley catheter today at nursing home.  He overall appears pleasant.  No abdominal tenderness on exam.  Foley catheter was replaced without any issues.  Patient was able to eat and drink without any issues.  He was discharged back to his facility.  This chart was dictated using voice recognition software.  Despite best efforts to  proofread,  errors can occur which can change the documentation meaning.   Final Clinical Impressions(s) / ED Diagnoses   Final diagnoses:  None    ED Discharge Orders    None       Lennice Sites, DO 03/28/18 1141

## 2018-03-30 DIAGNOSIS — R278 Other lack of coordination: Secondary | ICD-10-CM | POA: Diagnosis not present

## 2018-03-30 DIAGNOSIS — N401 Enlarged prostate with lower urinary tract symptoms: Secondary | ICD-10-CM | POA: Diagnosis not present

## 2018-03-30 DIAGNOSIS — Z960 Presence of urogenital implants: Secondary | ICD-10-CM | POA: Diagnosis not present

## 2018-03-30 DIAGNOSIS — M6281 Muscle weakness (generalized): Secondary | ICD-10-CM | POA: Diagnosis not present

## 2018-03-30 DIAGNOSIS — N179 Acute kidney failure, unspecified: Secondary | ICD-10-CM | POA: Diagnosis not present

## 2018-03-30 DIAGNOSIS — K5901 Slow transit constipation: Secondary | ICD-10-CM | POA: Diagnosis not present

## 2018-03-30 DIAGNOSIS — E119 Type 2 diabetes mellitus without complications: Secondary | ICD-10-CM | POA: Diagnosis not present

## 2018-03-30 DIAGNOSIS — Z760 Encounter for issue of repeat prescription: Secondary | ICD-10-CM | POA: Diagnosis not present

## 2018-03-30 DIAGNOSIS — F0151 Vascular dementia with behavioral disturbance: Secondary | ICD-10-CM | POA: Diagnosis not present

## 2018-03-31 DIAGNOSIS — M6281 Muscle weakness (generalized): Secondary | ICD-10-CM | POA: Diagnosis not present

## 2018-03-31 DIAGNOSIS — R278 Other lack of coordination: Secondary | ICD-10-CM | POA: Diagnosis not present

## 2018-04-01 ENCOUNTER — Emergency Department (HOSPITAL_COMMUNITY): Payer: Medicare Other

## 2018-04-01 ENCOUNTER — Encounter (HOSPITAL_COMMUNITY): Payer: Self-pay

## 2018-04-01 ENCOUNTER — Emergency Department (HOSPITAL_COMMUNITY)
Admission: EM | Admit: 2018-04-01 | Discharge: 2018-04-01 | Disposition: A | Discharge status: Home / Self Care | Payer: Medicare Other | Source: Home / Self Care | Attending: Emergency Medicine | Admitting: Emergency Medicine

## 2018-04-01 ENCOUNTER — Other Ambulatory Visit: Payer: Self-pay

## 2018-04-01 DIAGNOSIS — Z7982 Long term (current) use of aspirin: Secondary | ICD-10-CM

## 2018-04-01 DIAGNOSIS — F0390 Unspecified dementia without behavioral disturbance: Secondary | ICD-10-CM | POA: Insufficient documentation

## 2018-04-01 DIAGNOSIS — E785 Hyperlipidemia, unspecified: Secondary | ICD-10-CM | POA: Insufficient documentation

## 2018-04-01 DIAGNOSIS — N183 Chronic kidney disease, stage 3 (moderate): Secondary | ICD-10-CM | POA: Insufficient documentation

## 2018-04-01 DIAGNOSIS — Z794 Long term (current) use of insulin: Secondary | ICD-10-CM | POA: Insufficient documentation

## 2018-04-01 DIAGNOSIS — K5900 Constipation, unspecified: Secondary | ICD-10-CM

## 2018-04-01 DIAGNOSIS — F17210 Nicotine dependence, cigarettes, uncomplicated: Secondary | ICD-10-CM | POA: Insufficient documentation

## 2018-04-01 DIAGNOSIS — I129 Hypertensive chronic kidney disease with stage 1 through stage 4 chronic kidney disease, or unspecified chronic kidney disease: Secondary | ICD-10-CM

## 2018-04-01 DIAGNOSIS — Z79899 Other long term (current) drug therapy: Secondary | ICD-10-CM

## 2018-04-01 DIAGNOSIS — R6510 Systemic inflammatory response syndrome (SIRS) of non-infectious origin without acute organ dysfunction: Secondary | ICD-10-CM | POA: Diagnosis not present

## 2018-04-01 DIAGNOSIS — E1140 Type 2 diabetes mellitus with diabetic neuropathy, unspecified: Secondary | ICD-10-CM

## 2018-04-01 DIAGNOSIS — N179 Acute kidney failure, unspecified: Secondary | ICD-10-CM | POA: Diagnosis not present

## 2018-04-01 LAB — CBC WITH DIFFERENTIAL/PLATELET
Abs Immature Granulocytes: 0.07 10*3/uL (ref 0.00–0.07)
Basophils Absolute: 0.1 10*3/uL (ref 0.0–0.1)
Basophils Relative: 1 %
EOS ABS: 0.3 10*3/uL (ref 0.0–0.5)
Eosinophils Relative: 3 %
HCT: 32.1 % — ABNORMAL LOW (ref 39.0–52.0)
Hemoglobin: 9.6 g/dL — ABNORMAL LOW (ref 13.0–17.0)
IMMATURE GRANULOCYTES: 1 %
Lymphocytes Relative: 12 %
Lymphs Abs: 1.2 10*3/uL (ref 0.7–4.0)
MCH: 28.2 pg (ref 26.0–34.0)
MCHC: 29.9 g/dL — ABNORMAL LOW (ref 30.0–36.0)
MCV: 94.4 fL (ref 80.0–100.0)
Monocytes Absolute: 0.7 10*3/uL (ref 0.1–1.0)
Monocytes Relative: 8 %
Neutro Abs: 7.5 10*3/uL (ref 1.7–7.7)
Neutrophils Relative %: 75 %
Platelets: 191 10*3/uL (ref 150–400)
RBC: 3.4 MIL/uL — ABNORMAL LOW (ref 4.22–5.81)
RDW: 13.4 % (ref 11.5–15.5)
WBC: 9.8 10*3/uL (ref 4.0–10.5)
nRBC: 0 % (ref 0.0–0.2)

## 2018-04-01 LAB — URINALYSIS, ROUTINE W REFLEX MICROSCOPIC
Bacteria, UA: NONE SEEN
Bilirubin Urine: NEGATIVE
Glucose, UA: NEGATIVE mg/dL
Ketones, ur: NEGATIVE mg/dL
Leukocytes, UA: NEGATIVE
Nitrite: NEGATIVE
PH: 5 (ref 5.0–8.0)
Protein, ur: 100 mg/dL — AB
RBC / HPF: >50 RBC/hpf — ABNORMAL HIGH (ref 0–5)
Specific Gravity, Urine: 1.018 (ref 1.005–1.030)

## 2018-04-01 LAB — COMPREHENSIVE METABOLIC PANEL
ALT: 12 U/L (ref 0–44)
ANION GAP: 10 (ref 5–15)
AST: 15 U/L (ref 15–41)
Albumin: 3.3 g/dL — ABNORMAL LOW (ref 3.5–5.0)
Alkaline Phosphatase: 93 U/L (ref 38–126)
BUN: 26 mg/dL — ABNORMAL HIGH (ref 8–23)
CALCIUM: 9.1 mg/dL (ref 8.9–10.3)
CO2: 24 mmol/L (ref 22–32)
Chloride: 106 mmol/L (ref 98–111)
Creatinine, Ser: 1.79 mg/dL — ABNORMAL HIGH (ref 0.61–1.24)
GFR calc Af Amer: 43 mL/min — ABNORMAL LOW (ref >60)
GFR calc non Af Amer: 37 mL/min — ABNORMAL LOW (ref >60)
Glucose, Bld: 218 mg/dL — ABNORMAL HIGH (ref 70–99)
Potassium: 4.5 mmol/L (ref 3.5–5.1)
Sodium: 140 mmol/L (ref 135–145)
Total Bilirubin: 0.3 mg/dL (ref 0.3–1.2)
Total Protein: 7.1 g/dL (ref 6.5–8.1)

## 2018-04-01 LAB — I-STAT TROPONIN, ED: Troponin i, poc: 0.02 ng/mL (ref 0.00–0.08)

## 2018-04-01 LAB — MAGNESIUM: Magnesium: 2 mg/dL (ref 1.7–2.4)

## 2018-04-01 LAB — I-STAT CG4 LACTIC ACID, ED: Lactic Acid, Venous: 1.3 mmol/L (ref 0.5–1.9)

## 2018-04-01 MED ORDER — SORBITOL 70 % SOLN
960.0000 mL | TOPICAL_OIL | Freq: Once | ORAL | Status: DC
  Filled 2018-04-01: qty 473

## 2018-04-01 NOTE — ED Notes (Signed)
Meal given

## 2018-04-01 NOTE — ED Notes (Signed)
Pt refused enema

## 2018-04-01 NOTE — ED Notes (Signed)
Patient verbalizes understanding of discharge instructions. Opportunity for questioning and answers were provided. Armband removed by staff, pt discharged from ED. Pt taken back to [facility] via [employer]

## 2018-04-01 NOTE — ED Triage Notes (Signed)
Pt brought in by EMS due to having tremors that lasted about 10-15 seconds. Pt was conscious during episode. Pt does not have hx of seizures. Pt has hx if dementia. CBG was 250.

## 2018-04-01 NOTE — ED Notes (Signed)
RN attempted to call [facility] with no answer

## 2018-04-01 NOTE — ED Notes (Signed)
CALLED [facility] FOR PT TRANSPORT  

## 2018-04-01 NOTE — ED Provider Notes (Signed)
[place] EMERGENCY DEPARTMENT Provider Note   CSN: [id number] Arrival date & time: 04/01/18  8250     History   Chief Complaint Chief Complaint  Patient presents with  . Tremors    HPI Manuel OLDAKER Sr. is a 73 y.o. male.  Patient with episodes of shaking his feet and his arms violently.  Patient is responsive during these episodes.  Patient able answer questions and voluntarily move his extremities during these episodes.  Started today.  No fevers.  Was recently seen here for urinary retention likely secondary to large amount of stool in his colon.  The history is provided by the patient, the nursing home and a relative.    Past Medical History:  Diagnosis Date  . Anemia   . Diabetes ([place])   . GERD (gastroesophageal reflux disease)   . Hyperlipidemia   . Hypertension   . Leukemia [facility])     Patient Active Problem List   Diagnosis Date Noted  . Acute pancreatitis without infection or necrosis   . Hypothermia 04/10/2017  . AKI (acute kidney injury) ([place]) 04/10/2017  . Hyperkalemia 04/10/2017  . Acute metabolic encephalopathy 53/97/6734  . Dementia ([place]) 04/10/2017  . Sepsis ([place]) 04/10/2017  . HCAP (healthcare-associated pneumonia) 04/10/2017  . Rhabdomyolysis 11/14/2016  . Multiple lacunar infarcts 11/06/2016  . Small vessel disease, cerebrovascular 11/06/2016  . Syncope 09/13/2016  . Dermatitis 03/01/2016  . Anemia, pernicious 07/04/2015  . Hip pain 07/04/2015  . Aortic valve sclerosis 02/19/2015  . Chronic lymphocytic leukemia ([place]) 02/19/2015  . DM type 2 with diabetic peripheral neuropathy ([place]) 02/19/2015  . Essential (primary) hypertension 02/19/2015  . DM type 2 with diabetic mixed hyperlipidemia ([place]) 02/19/2015  . Diabetic peripheral neuropathy associated with type 2 diabetes mellitus ([place]) 02/19/2015  . Bleeding ulcer 02/19/2015  . Peripheral vascular disease ([place]) 02/19/2015  . Chronic inflammation of tunica albuginea  02/19/2015  . Encounter for screening for malignant neoplasm of prostate 02/19/2015  . Type 2 diabetes mellitus with stage 3 chronic kidney disease ([place]) 02/19/2015  . Compulsive tobacco user syndrome 02/19/2015  . 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  . Aortic valve defect 12/22/2012  . 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[place]) 11/13/2011    Past Surgical History:  Procedure Laterality Date  . CATARACT EXTRACTION W/PHACO Left 09/13/2014   Procedure: CATARACT EXTRACTION PHACO AND INTRAOCULAR LENS PLACEMENT (IOC);  Surgeon: Birder Robson, MD;  Location: [facility] ORS;  Service: Ophthalmology;  Laterality: Left;  [place] 01:03   . COLONOSCOPY  2014  . POPLITEAL ARTERY ANGIOPLASTY Left 2014  . PTCA Right    leg  . UPPER GASTROINTESTINAL ENDOSCOPY  2014        Home Medications    Prior to Admission medications   Medication Sig Start Date End Date Taking? Authorizing Provider  acetaminophen (TYLENOL) 500 MG tablet Take 500 mg by mouth every 4 (four) hours as needed for mild pain, fever or headache. NOT TO EXCEED 2000 MG IN 24 HOURS    [provider]  alum & mag hydroxide-simeth (MINTOX) 193-790-24 MG/5ML suspension Take 30 mLs by mouth every 6 (six) hours as needed for indigestion or heartburn. NOT TO EXCEED 4 DOSES IN 24 HOURS    [provider]  amLODipine (NORVASC) 10 MG tablet Take 1 tablet (10 mg total) by mouth daily. 04/15/17   [name], MD  aspirin EC 81 MG tablet Take 81 mg by mouth daily.    [provider]  atorvastatin (LIPITOR) 10 MG tablet Take 10 mg by mouth at bedtime.     [provider]  docusate sodium (COLACE) 100 MG capsule Take 1 capsule (100 mg total) by mouth every 12 (twelve) hours. 03/26/18   Dorman Calderwood, Corene Cornea, MD  gabapentin (NEURONTIN) 300 MG capsule TAKE 1 CAPSULE BY MOUTH 3  TIMES DAILY Patient taking differently: Take 300 mg by mouth 3 (three) times daily.  08/30/16   [name], MD    glucose blood test strip Use as instructed 03/06/15   [name], MD  guaiFENesin (ROBITUSSIN) 100 MG/5ML liquid Take 200 mg by mouth 3 (three) times daily as needed for cough.    [provider]  hydrALAZINE (APRESOLINE) 25 MG tablet Take 25 mg by mouth 3 (three) times daily.    [provider]  hydrALAZINE (APRESOLINE) 50 MG tablet Take 1.5 tablets (75 mg total) by mouth every 8 (eight) hours. Patient not taking: Reported on 03/28/2018 04/15/17   [name], MD  insulin glargine (LANTUS) 100 UNIT/ML injection Inject 0.12 mLs (12 Units total) into the skin daily. Patient taking differently: Inject 14 Units into the skin daily.  04/16/17   [name], MD  insulin lispro (HUMALOG KWIKPEN) 100 UNIT/ML KiwkPen Inject 3 Units into the skin 3 (three) times daily. Hold if meal time BS is 100 or less. Call MD if BS greater than 450    [provider]  Lancets 28G MISC 1 each by Does not apply route daily. 03/06/15   [name], MD  loperamide (IMODIUM) 2 MG capsule Take 2 mg by mouth as needed for diarrhea or loose stools. DO NOT EXCEED 8 DOSES IN 24 HOURS    [provider]  magnesium hydroxide (MILK OF MAGNESIA) 400 MG/5ML suspension Take 30 mLs by mouth at bedtime as needed for mild constipation or moderate constipation.    [provider]  Multiple Vitamin (MULTIVITAMIN WITH MINERALS) TABS tablet Take 1 tablet by mouth daily.    [provider]  mupirocin ointment (BACTROBAN) 2 % Place 1 application into the nose 3 (three) times daily.    [provider]  neomycin-bacitracin-polymyxin (NEOSPORIN) ointment Apply 1 application topically every 8 (eight) hours as needed for wound care.     [provider]  PARoxetine (PAXIL) 20 MG tablet Take 20 mg by mouth daily.    [provider]  polyethylene glycol (MIRALAX / GLYCOLAX) packet Take 17 g by mouth daily as needed for moderate constipation. 03/26/18    Kimarion Chery, Corene Cornea, MD  QUEtiapine (SEROQUEL) 25 MG tablet Take 1 tablet (25 mg total) by mouth at bedtime. Patient taking differently: Take 25 mg by mouth daily.  04/15/17   [name], MD  triamcinolone cream (KENALOG) 0.1 % APPLY TO AFFECTED AREA TWICE A DAY Patient taking differently: Apply 1 application topically 2 (two) times daily. APPLY TO AFFECTED AREA TWICE A DAY 08/07/16   Daleen Bo, MD    Family History Family History  Problem Relation Age of Onset  . Diabetes Mother   . Diabetes Father     Social History Social History   Tobacco Use  . Smoking status: Current Every Day Smoker    Packs/day: 0.50    Types: Cigarettes  . Smokeless tobacco: Never Used  Substance Use Topics  . Alcohol use: No    Alcohol/week: 0.0 standard drinks  . Drug use: No     Allergies   Patient has no known allergies.  Review of Systems Review of Systems  All other systems reviewed and are negative.    Physical Exam Updated Vital Signs BP (!) 151/57 (BP Location: Left Arm)   Pulse 85   Temp 99 F (37.2 C) (Oral)   Resp 16   Ht 5\' 8"  (1.727 m)   Wt 81.6 kg   SpO2 100%   BMI 27.35 kg/m   Physical Exam Vitals signs and nursing note reviewed.  Constitutional:      Appearance: He is well-developed.  HENT:     Head: Normocephalic and atraumatic.  Eyes:     Extraocular Movements: Extraocular movements intact.     Conjunctiva/sclera: Conjunctivae normal.     Pupils: Pupils are equal, round, and reactive to light.  Neck:     Musculoskeletal: Normal range of motion.  Cardiovascular:     Rate and Rhythm: Normal rate.  Pulmonary:     Effort: Pulmonary effort is normal. No respiratory distress.  Abdominal:     General: There is no distension.  Musculoskeletal: Normal range of motion.  Skin:    General: Skin is warm and dry.  Neurological:     Mental Status: He is alert.      ED Treatments / Results  Labs (all labs ordered are listed, but only abnormal results  are displayed) Labs Reviewed  COMPREHENSIVE METABOLIC PANEL - Abnormal; Notable for the following components:      Result Value   Glucose, Bld 218 (*)    BUN 26 (*)    Creatinine, Ser 1.79 (*)    Albumin 3.3 (*)    GFR calc non Af Amer 37 (*)    GFR calc Af Amer 43 (*)    All other components within normal limits  CBC WITH DIFFERENTIAL/PLATELET - Abnormal; Notable for the following components:   RBC 3.40 (*)    Hemoglobin 9.6 (*)    HCT 32.1 (*)    MCHC 29.9 (*)    All other components within normal limits  URINALYSIS, ROUTINE W REFLEX MICROSCOPIC - Abnormal; Notable for the following components:   APPearance HAZY (*)    Hgb urine dipstick MODERATE (*)    Protein, ur 100 (*)    RBC / HPF >50 (*)    All other components within normal limits  CULTURE, BLOOD (ROUTINE X 2)  CULTURE, BLOOD (ROUTINE X 2)  MAGNESIUM  I-STAT CG4 LACTIC ACID, ED  I-STAT TROPONIN, ED    EKG EKG Interpretation  Date/Time:  Wednesday April 01 2018 09:51:22 EST Ventricular Rate:  74 PR Interval:    QRS Duration: 124 QT Interval:  416 QTC Calculation: 462 R Axis:   166 Text Interpretation:  Sinus rhythm Nonspecific intraventricular conduction delay No acute changes Confirmed by Merrily Pew [phone]) on 04/01/2018 10:20:41 AM   Radiology Dg Chest 2 View  Result Date: 04/01/2018 CLINICAL DATA:  73 year old male with history of tremors lasting 10-15 seconds. No loss of consciousness. EXAM: CHEST - 2 VIEW COMPARISON:  Chest x-ray 09/29/2017. FINDINGS: Low lung volumes. Interstitial prominence throughout the mid to lower lungs bilaterally, similar to several prior examinations. No confluent consolidative airspace disease. No pleural effusions. No evidence of pulmonary edema. Heart size is normal. Upper mediastinal contours are within normal limits. Aortic atherosclerosis. IMPRESSION: 1. Low lung volumes with interstitial prominence throughout the mid to lower lungs bilaterally. Findings are concerning for  underlying interstitial lung disease. Follow-up nonemergent high-resolution chest CT is suggested to better evaluate these findings. 2. Aortic atherosclerosis. Electronically Signed  By: Vinnie Langton M.D.   On: 04/01/2018 11:01   Dg Abdomen 1 View  Result Date: 04/01/2018 CLINICAL DATA:  Evaluate for pneumoperitoneum EXAM: ABDOMEN - 1 VIEW COMPARISON:  Chest x-ray from earlier today FINDINGS: No evident pneumoperitoneum. There is a large volume of stool, especially at the rectum where there is distention to 11 cm. No dilated small bowel. No concerning mass effect. Diffuse atherosclerotic calcification. IMPRESSION: 1. No evidence of free air. 2. Generalized stool retention with rectal distention to 11 cm. Electronically Signed   By: Monte Fantasia M.D.   On: 04/01/2018 13:41   Ct Head Wo Contrast  Result Date: 04/01/2018 CLINICAL DATA:  73 year old with focal neuro deficit. Stroke suspected. New onset of tremors. EXAM: CT HEAD WITHOUT CONTRAST TECHNIQUE: Contiguous axial images were obtained from the base of the skull through the vertex without intravenous contrast. COMPARISON:  06/17/2017 FINDINGS: Brain: Stable cerebral atrophy. Stable areas of low-density in the left basal ganglia compatible with old lacune infarcts. Scattered areas of low density throughout the subcortical and periventricular white matter compatible with chronic ischemic changes. White matter disease has not significantly changed. Negative for acute hemorrhage, mass lesion, midline shift, hydrocephalus or new large infarct. Vascular: No hyperdense vessel or unexpected calcification. Skull: Normal. Negative for fracture or focal lesion. Sinuses/Orbits: Mucosal thickening in the ethmoid air cells. Mucosal disease in the bilateral maxillary sinuses. Other: None. IMPRESSION: 1. No acute intracranial abnormality. 2. Stable atrophy and evidence for chronic small vessel ischemic disease and remote lacune infarcts. Electronically Signed   By:  Markus Daft M.D.   On: 04/01/2018 11:22    Procedures Procedures (including critical care time)  Medications Ordered in ED Medications - No data to display   Initial Impression / Assessment and Plan / ED Course  I have reviewed the triage vital signs and the nursing notes.  Pertinent labs & imaging results that were available during my care of the patient were reviewed by me and considered in my medical decision making (see chart for details).     Patient had episode here and on my evaluation during the episode patient state he had lower abdominal pain during this.  Last about 10 seconds.  I think the patient probably has a fecal impaction or constipation and when he has these waves of discomfort secondary to peristalsis is when he has these shaking episodes.  Attempted to do a rectal exam and enema however patient refused both.  I discussed that if this got worse he could have perforation of his bowel or worsening symptoms and he did not want that he could "take care of it myself".  Will suggest bowel cleanout regimen at the facility return here if he changes his mind wants further treatment.  Final Clinical Impressions(s) / ED Diagnoses   Final diagnoses:  Constipation, unspecified constipation type    ED Discharge Orders    None       Srihitha Tagliaferri, Corene Cornea, MD 04/02/18 2058

## 2018-04-01 NOTE — ED Notes (Signed)
PTAR given paperwork prior to pt d/c back to [facility]

## 2018-04-02 ENCOUNTER — Inpatient Hospital Stay (HOSPITAL_COMMUNITY)
Admission: EM | Admit: 2018-04-02 | Discharge: 2018-04-06 | DRG: 683 | Disposition: A | Discharge status: Skilled Nursing Facility | Payer: Medicare Other | Source: Skilled Nursing Facility | Attending: Internal Medicine | Admitting: Internal Medicine

## 2018-04-02 ENCOUNTER — Encounter (HOSPITAL_COMMUNITY): Payer: Self-pay | Admitting: Emergency Medicine

## 2018-04-02 DIAGNOSIS — R509 Fever, unspecified: Secondary | ICD-10-CM | POA: Diagnosis present

## 2018-04-02 DIAGNOSIS — E1122 Type 2 diabetes mellitus with diabetic chronic kidney disease: Secondary | ICD-10-CM | POA: Diagnosis present

## 2018-04-02 DIAGNOSIS — I129 Hypertensive chronic kidney disease with stage 1 through stage 4 chronic kidney disease, or unspecified chronic kidney disease: Secondary | ICD-10-CM | POA: Diagnosis present

## 2018-04-02 DIAGNOSIS — I739 Peripheral vascular disease, unspecified: Secondary | ICD-10-CM | POA: Diagnosis present

## 2018-04-02 DIAGNOSIS — Y731 Therapeutic (nonsurgical) and rehabilitative gastroenterology and urology devices associated with adverse incidents: Secondary | ICD-10-CM | POA: Diagnosis present

## 2018-04-02 DIAGNOSIS — F17210 Nicotine dependence, cigarettes, uncomplicated: Secondary | ICD-10-CM | POA: Diagnosis present

## 2018-04-02 DIAGNOSIS — N183 Chronic kidney disease, stage 3 (moderate): Secondary | ICD-10-CM | POA: Diagnosis present

## 2018-04-02 DIAGNOSIS — Z79899 Other long term (current) drug therapy: Secondary | ICD-10-CM

## 2018-04-02 DIAGNOSIS — R319 Hematuria, unspecified: Secondary | ICD-10-CM | POA: Diagnosis present

## 2018-04-02 DIAGNOSIS — Y846 Urinary catheterization as the cause of abnormal reaction of the patient, or of later complication, without mention of misadventure at the time of the procedure: Secondary | ICD-10-CM | POA: Diagnosis present

## 2018-04-02 DIAGNOSIS — Z7982 Long term (current) use of aspirin: Secondary | ICD-10-CM

## 2018-04-02 DIAGNOSIS — R339 Retention of urine, unspecified: Secondary | ICD-10-CM | POA: Diagnosis present

## 2018-04-02 DIAGNOSIS — Z833 Family history of diabetes mellitus: Secondary | ICD-10-CM

## 2018-04-02 DIAGNOSIS — I10 Essential (primary) hypertension: Secondary | ICD-10-CM | POA: Diagnosis present

## 2018-04-02 DIAGNOSIS — C9110 Chronic lymphocytic leukemia of B-cell type not having achieved remission: Secondary | ICD-10-CM | POA: Diagnosis present

## 2018-04-02 DIAGNOSIS — Q2733 Arteriovenous malformation of digestive system vessel: Secondary | ICD-10-CM

## 2018-04-02 DIAGNOSIS — E1151 Type 2 diabetes mellitus with diabetic peripheral angiopathy without gangrene: Secondary | ICD-10-CM | POA: Diagnosis present

## 2018-04-02 DIAGNOSIS — E875 Hyperkalemia: Secondary | ICD-10-CM | POA: Diagnosis present

## 2018-04-02 DIAGNOSIS — Z794 Long term (current) use of insulin: Secondary | ICD-10-CM

## 2018-04-02 DIAGNOSIS — K219 Gastro-esophageal reflux disease without esophagitis: Secondary | ICD-10-CM | POA: Diagnosis present

## 2018-04-02 DIAGNOSIS — N390 Urinary tract infection, site not specified: Secondary | ICD-10-CM | POA: Diagnosis present

## 2018-04-02 DIAGNOSIS — E782 Mixed hyperlipidemia: Secondary | ICD-10-CM | POA: Diagnosis present

## 2018-04-02 DIAGNOSIS — R6510 Systemic inflammatory response syndrome (SIRS) of non-infectious origin without acute organ dysfunction: Secondary | ICD-10-CM | POA: Diagnosis present

## 2018-04-02 DIAGNOSIS — N189 Chronic kidney disease, unspecified: Secondary | ICD-10-CM

## 2018-04-02 DIAGNOSIS — E1142 Type 2 diabetes mellitus with diabetic polyneuropathy: Secondary | ICD-10-CM | POA: Diagnosis present

## 2018-04-02 DIAGNOSIS — N179 Acute kidney failure, unspecified: Principal | ICD-10-CM | POA: Diagnosis present

## 2018-04-02 DIAGNOSIS — R5381 Other malaise: Secondary | ICD-10-CM | POA: Diagnosis present

## 2018-04-02 DIAGNOSIS — K5520 Angiodysplasia of colon without hemorrhage: Secondary | ICD-10-CM

## 2018-04-02 DIAGNOSIS — T839XXA Unspecified complication of genitourinary prosthetic device, implant and graft, initial encounter: Secondary | ICD-10-CM | POA: Diagnosis present

## 2018-04-02 DIAGNOSIS — A419 Sepsis, unspecified organism: Secondary | ICD-10-CM

## 2018-04-02 DIAGNOSIS — D649 Anemia, unspecified: Secondary | ICD-10-CM | POA: Diagnosis present

## 2018-04-02 DIAGNOSIS — N368 Other specified disorders of urethra: Secondary | ICD-10-CM | POA: Diagnosis present

## 2018-04-02 DIAGNOSIS — E1169 Type 2 diabetes mellitus with other specified complication: Secondary | ICD-10-CM | POA: Diagnosis present

## 2018-04-02 DIAGNOSIS — Z8673 Personal history of transient ischemic attack (TIA), and cerebral infarction without residual deficits: Secondary | ICD-10-CM

## 2018-04-02 DIAGNOSIS — F0390 Unspecified dementia without behavioral disturbance: Secondary | ICD-10-CM | POA: Diagnosis present

## 2018-04-02 DIAGNOSIS — Z9862 Peripheral vascular angioplasty status: Secondary | ICD-10-CM

## 2018-04-02 LAB — CBC
HCT: 31.4 % — ABNORMAL LOW (ref 39.0–52.0)
Hemoglobin: 9.9 g/dL — ABNORMAL LOW (ref 13.0–17.0)
MCH: 29.8 pg (ref 26.0–34.0)
MCHC: 31.5 g/dL (ref 30.0–36.0)
MCV: 94.6 fL (ref 80.0–100.0)
NRBC: 0 % (ref 0.0–0.2)
PLATELETS: 240 10*3/uL (ref 150–400)
RBC: 3.32 MIL/uL — ABNORMAL LOW (ref 4.22–5.81)
RDW: 13.5 % (ref 11.5–15.5)
WBC: 13.3 10*3/uL — ABNORMAL HIGH (ref 4.0–10.5)

## 2018-04-02 LAB — BASIC METABOLIC PANEL
Anion gap: 10 (ref 5–15)
BUN: 26 mg/dL — ABNORMAL HIGH (ref 8–23)
CO2: 25 mmol/L (ref 22–32)
Calcium: 9.2 mg/dL (ref 8.9–10.3)
Chloride: 105 mmol/L (ref 98–111)
Creatinine, Ser: 2.16 mg/dL — ABNORMAL HIGH (ref 0.61–1.24)
GFR calc Af Amer: 34 mL/min — ABNORMAL LOW (ref >60)
GFR calc non Af Amer: 30 mL/min — ABNORMAL LOW (ref >60)
Glucose, Bld: 170 mg/dL — ABNORMAL HIGH (ref 70–99)
Potassium: 5.5 mmol/L — ABNORMAL HIGH (ref 3.5–5.1)
Sodium: 140 mmol/L (ref 135–145)

## 2018-04-02 NOTE — ED Triage Notes (Signed)
Pt here via [facility], facility reports blood in pts urine, upon inspection there is blood around meatus.  No blood noted to urine bag. Pt A&O x4, denies any complaints.

## 2018-04-03 ENCOUNTER — Emergency Department (HOSPITAL_COMMUNITY): Payer: Medicare Other

## 2018-04-03 ENCOUNTER — Other Ambulatory Visit: Payer: Self-pay

## 2018-04-03 DIAGNOSIS — E875 Hyperkalemia: Secondary | ICD-10-CM | POA: Diagnosis present

## 2018-04-03 DIAGNOSIS — E1169 Type 2 diabetes mellitus with other specified complication: Secondary | ICD-10-CM | POA: Diagnosis present

## 2018-04-03 DIAGNOSIS — Y731 Therapeutic (nonsurgical) and rehabilitative gastroenterology and urology devices associated with adverse incidents: Secondary | ICD-10-CM | POA: Diagnosis present

## 2018-04-03 DIAGNOSIS — N179 Acute kidney failure, unspecified: Secondary | ICD-10-CM | POA: Diagnosis present

## 2018-04-03 DIAGNOSIS — Y846 Urinary catheterization as the cause of abnormal reaction of the patient, or of later complication, without mention of misadventure at the time of the procedure: Secondary | ICD-10-CM | POA: Diagnosis present

## 2018-04-03 DIAGNOSIS — N183 Chronic kidney disease, stage 3 (moderate): Secondary | ICD-10-CM | POA: Diagnosis present

## 2018-04-03 DIAGNOSIS — N368 Other specified disorders of urethra: Secondary | ICD-10-CM | POA: Diagnosis present

## 2018-04-03 DIAGNOSIS — Q2733 Arteriovenous malformation of digestive system vessel: Secondary | ICD-10-CM | POA: Diagnosis not present

## 2018-04-03 DIAGNOSIS — E1142 Type 2 diabetes mellitus with diabetic polyneuropathy: Secondary | ICD-10-CM | POA: Diagnosis present

## 2018-04-03 DIAGNOSIS — R6510 Systemic inflammatory response syndrome (SIRS) of non-infectious origin without acute organ dysfunction: Secondary | ICD-10-CM | POA: Diagnosis present

## 2018-04-03 DIAGNOSIS — R319 Hematuria, unspecified: Secondary | ICD-10-CM | POA: Diagnosis present

## 2018-04-03 DIAGNOSIS — E1122 Type 2 diabetes mellitus with diabetic chronic kidney disease: Secondary | ICD-10-CM | POA: Diagnosis present

## 2018-04-03 DIAGNOSIS — T839XXA Unspecified complication of genitourinary prosthetic device, implant and graft, initial encounter: Secondary | ICD-10-CM | POA: Diagnosis present

## 2018-04-03 DIAGNOSIS — N390 Urinary tract infection, site not specified: Secondary | ICD-10-CM | POA: Diagnosis present

## 2018-04-03 DIAGNOSIS — K219 Gastro-esophageal reflux disease without esophagitis: Secondary | ICD-10-CM | POA: Diagnosis present

## 2018-04-03 DIAGNOSIS — N189 Chronic kidney disease, unspecified: Secondary | ICD-10-CM

## 2018-04-03 DIAGNOSIS — R509 Fever, unspecified: Secondary | ICD-10-CM | POA: Diagnosis present

## 2018-04-03 DIAGNOSIS — D649 Anemia, unspecified: Secondary | ICD-10-CM | POA: Diagnosis present

## 2018-04-03 DIAGNOSIS — Z794 Long term (current) use of insulin: Secondary | ICD-10-CM | POA: Diagnosis not present

## 2018-04-03 DIAGNOSIS — R339 Retention of urine, unspecified: Secondary | ICD-10-CM | POA: Diagnosis present

## 2018-04-03 DIAGNOSIS — E1151 Type 2 diabetes mellitus with diabetic peripheral angiopathy without gangrene: Secondary | ICD-10-CM | POA: Diagnosis present

## 2018-04-03 DIAGNOSIS — R5381 Other malaise: Secondary | ICD-10-CM | POA: Diagnosis present

## 2018-04-03 DIAGNOSIS — E782 Mixed hyperlipidemia: Secondary | ICD-10-CM | POA: Diagnosis present

## 2018-04-03 DIAGNOSIS — Z8673 Personal history of transient ischemic attack (TIA), and cerebral infarction without residual deficits: Secondary | ICD-10-CM | POA: Diagnosis not present

## 2018-04-03 DIAGNOSIS — Z7982 Long term (current) use of aspirin: Secondary | ICD-10-CM | POA: Diagnosis not present

## 2018-04-03 DIAGNOSIS — I129 Hypertensive chronic kidney disease with stage 1 through stage 4 chronic kidney disease, or unspecified chronic kidney disease: Secondary | ICD-10-CM | POA: Diagnosis present

## 2018-04-03 DIAGNOSIS — C9110 Chronic lymphocytic leukemia of B-cell type not having achieved remission: Secondary | ICD-10-CM | POA: Diagnosis present

## 2018-04-03 DIAGNOSIS — F0390 Unspecified dementia without behavioral disturbance: Secondary | ICD-10-CM | POA: Diagnosis present

## 2018-04-03 DIAGNOSIS — F17210 Nicotine dependence, cigarettes, uncomplicated: Secondary | ICD-10-CM | POA: Diagnosis present

## 2018-04-03 LAB — GLUCOSE, CAPILLARY
GLUCOSE-CAPILLARY: 128 mg/dL — AB (ref 70–99)
Glucose-Capillary: 113 mg/dL — ABNORMAL HIGH (ref 70–99)
Glucose-Capillary: 259 mg/dL — ABNORMAL HIGH (ref 70–99)
Glucose-Capillary: 106 mg/dL — ABNORMAL HIGH (ref 70–99)

## 2018-04-03 LAB — URINALYSIS, MICROSCOPIC (REFLEX)

## 2018-04-03 LAB — BASIC METABOLIC PANEL
Anion gap: 6 (ref 5–15)
BUN: 26 mg/dL — ABNORMAL HIGH (ref 8–23)
CALCIUM: 8.5 mg/dL — AB (ref 8.9–10.3)
CO2: 23 mmol/L (ref 22–32)
Chloride: 109 mmol/L (ref 98–111)
Creatinine, Ser: 2.03 mg/dL — ABNORMAL HIGH (ref 0.61–1.24)
GFR calc Af Amer: 37 mL/min — ABNORMAL LOW (ref >60)
GFR calc non Af Amer: 32 mL/min — ABNORMAL LOW (ref >60)
Glucose, Bld: 272 mg/dL — ABNORMAL HIGH (ref 70–99)
Potassium: 4.7 mmol/L (ref 3.5–5.1)
Sodium: 138 mmol/L (ref 135–145)

## 2018-04-03 LAB — URINALYSIS, ROUTINE W REFLEX MICROSCOPIC
Bilirubin Urine: NEGATIVE
Glucose, UA: NEGATIVE mg/dL
KETONES UR: NEGATIVE mg/dL
Nitrite: NEGATIVE
Protein, ur: 100 mg/dL — AB
Specific Gravity, Urine: 1.015 (ref 1.005–1.030)
pH: 6.5 (ref 5.0–8.0)

## 2018-04-03 LAB — HEPATIC FUNCTION PANEL
ALT: 12 U/L (ref 0–44)
AST: 15 U/L (ref 15–41)
Albumin: 3 g/dL — ABNORMAL LOW (ref 3.5–5.0)
Alkaline Phosphatase: 96 U/L (ref 38–126)
Total Bilirubin: 0.2 mg/dL — ABNORMAL LOW (ref 0.3–1.2)
Total Protein: 6.8 g/dL (ref 6.5–8.1)

## 2018-04-03 LAB — MRSA PCR SCREENING: MRSA by PCR: NEGATIVE

## 2018-04-03 LAB — I-STAT CG4 LACTIC ACID, ED: Lactic Acid, Venous: 1.12 mmol/L (ref 0.5–1.9)

## 2018-04-03 MED ORDER — SODIUM CHLORIDE 0.9 % IV SOLN
2.0000 g | Freq: Once | INTRAVENOUS | Status: AC
  Administered 2018-04-03: 2 g via INTRAVENOUS
  Filled 2018-04-03: qty 2

## 2018-04-03 MED ORDER — ONDANSETRON HCL 4 MG/2ML IJ SOLN: 4.0000 mg | Freq: Four times a day (QID) | INTRAMUSCULAR | Status: DC | PRN

## 2018-04-03 MED ORDER — ADULT MULTIVITAMIN W/MINERALS CH
1.0000 | ORAL_TABLET | Freq: Every day | ORAL | Status: DC
  Administered 2018-04-03 – 2018-04-06 (×4): 1 via ORAL
  Filled 2018-04-03 (×4): qty 1

## 2018-04-03 MED ORDER — MAGNESIUM HYDROXIDE 400 MG/5ML PO SUSP: 30.0000 mL | Freq: Every evening | ORAL | Status: DC | PRN

## 2018-04-03 MED ORDER — INSULIN ASPART 100 UNIT/ML ~~LOC~~ SOLN
3.0000 [IU] | Freq: Three times a day (TID) | SUBCUTANEOUS | Status: DC
  Administered 2018-04-03 – 2018-04-06 (×12): 3 [IU] via SUBCUTANEOUS

## 2018-04-03 MED ORDER — LANCETS 28G MISC: 1.0000 | Freq: Every day | Status: DC

## 2018-04-03 MED ORDER — VANCOMYCIN HCL IN DEXTROSE 1-5 GM/200ML-% IV SOLN
1000.0000 mg | INTRAVENOUS | Status: DC
  Administered 2018-04-03 – 2018-04-05 (×3): 1000 mg via INTRAVENOUS
  Filled 2018-04-03 (×3): qty 200

## 2018-04-03 MED ORDER — DOCUSATE SODIUM 100 MG PO CAPS
100.0000 mg | ORAL_CAPSULE | Freq: Two times a day (BID) | ORAL | Status: DC
  Administered 2018-04-03 – 2018-04-06 (×8): 100 mg via ORAL
  Filled 2018-04-03 (×8): qty 1

## 2018-04-03 MED ORDER — ALUM & MAG HYDROXIDE-SIMETH 200-200-20 MG/5ML PO SUSP: 30.0000 mL | Freq: Four times a day (QID) | ORAL | Status: DC | PRN

## 2018-04-03 MED ORDER — VANCOMYCIN HCL IN DEXTROSE 1-5 GM/200ML-% IV SOLN
1000.0000 mg | Freq: Once | INTRAVENOUS | Status: AC
  Administered 2018-04-03: 1000 mg via INTRAVENOUS
  Filled 2018-04-03: qty 200

## 2018-04-03 MED ORDER — AMLODIPINE BESYLATE 5 MG PO TABS
10.0000 mg | ORAL_TABLET | Freq: Every day | ORAL | Status: DC
  Administered 2018-04-03 – 2018-04-06 (×3): 10 mg via ORAL
  Filled 2018-04-03 (×4): qty 2

## 2018-04-03 MED ORDER — INSULIN GLARGINE 100 UNIT/ML ~~LOC~~ SOLN
12.0000 [IU] | Freq: Every day | SUBCUTANEOUS | Status: DC
  Administered 2018-04-03 – 2018-04-06 (×4): 12 [IU] via SUBCUTANEOUS
  Filled 2018-04-03 (×4): qty 0.12

## 2018-04-03 MED ORDER — QUETIAPINE FUMARATE 25 MG PO TABS
25.0000 mg | ORAL_TABLET | Freq: Every day | ORAL | Status: DC
  Administered 2018-04-03 – 2018-04-06 (×4): 25 mg via ORAL
  Filled 2018-04-03 (×4): qty 1

## 2018-04-03 MED ORDER — GUAIFENESIN 100 MG/5ML PO SOLN: 100.0000 mg | Freq: Three times a day (TID) | ORAL | Status: DC | PRN

## 2018-04-03 MED ORDER — PAROXETINE HCL 20 MG PO TABS
20.0000 mg | ORAL_TABLET | Freq: Every day | ORAL | Status: DC
  Administered 2018-04-03 – 2018-04-06 (×4): 20 mg via ORAL
  Filled 2018-04-03 (×4): qty 1

## 2018-04-03 MED ORDER — SODIUM CHLORIDE 0.9% FLUSH
3.0000 mL | Freq: Two times a day (BID) | INTRAVENOUS | Status: DC
  Administered 2018-04-03 – 2018-04-06 (×5): 3 mL via INTRAVENOUS

## 2018-04-03 MED ORDER — ONDANSETRON HCL 4 MG PO TABS: 4.0000 mg | ORAL_TABLET | Freq: Four times a day (QID) | ORAL | Status: DC | PRN

## 2018-04-03 MED ORDER — LOPERAMIDE HCL 2 MG PO CAPS: 2.0000 mg | ORAL_CAPSULE | ORAL | Status: DC | PRN

## 2018-04-03 MED ORDER — ATORVASTATIN CALCIUM 10 MG PO TABS
10.0000 mg | ORAL_TABLET | Freq: Every day | ORAL | Status: DC
  Administered 2018-04-03 – 2018-04-06 (×4): 10 mg via ORAL
  Filled 2018-04-03 (×4): qty 1

## 2018-04-03 MED ORDER — ACETAMINOPHEN 500 MG PO TABS: 500.0000 mg | ORAL_TABLET | ORAL | Status: DC | PRN

## 2018-04-03 MED ORDER — SODIUM CHLORIDE 0.9% FLUSH: 3.0000 mL | INTRAVENOUS | Status: DC | PRN

## 2018-04-03 MED ORDER — SODIUM CHLORIDE 0.9 % IV SOLN
1.0000 g | INTRAVENOUS | Status: DC
  Administered 2018-04-04 – 2018-04-06 (×3): 1 g via INTRAVENOUS
  Filled 2018-04-03 (×3): qty 1

## 2018-04-03 MED ORDER — SODIUM CHLORIDE 0.9 % IV SOLN: 250.0000 mL | INTRAVENOUS | Status: DC | PRN

## 2018-04-03 MED ORDER — METRONIDAZOLE IN NACL 5-0.79 MG/ML-% IV SOLN
500.0000 mg | Freq: Three times a day (TID) | INTRAVENOUS | Status: DC
  Administered 2018-04-03 – 2018-04-04 (×5): 500 mg via INTRAVENOUS
  Filled 2018-04-03 (×5): qty 100

## 2018-04-03 MED ORDER — HYDRALAZINE HCL 25 MG PO TABS
25.0000 mg | ORAL_TABLET | Freq: Three times a day (TID) | ORAL | Status: DC
  Administered 2018-04-03 – 2018-04-06 (×8): 25 mg via ORAL
  Filled 2018-04-03 (×9): qty 1

## 2018-04-03 MED ORDER — POLYETHYLENE GLYCOL 3350 17 G PO PACK: 17.0000 g | PACK | Freq: Every day | ORAL | Status: DC | PRN

## 2018-04-03 MED ORDER — HYDRALAZINE HCL 25 MG PO TABS
75.0000 mg | ORAL_TABLET | Freq: Three times a day (TID) | ORAL | Status: DC
  Administered 2018-04-03 – 2018-04-06 (×10): 75 mg via ORAL
  Filled 2018-04-03 (×9): qty 3

## 2018-04-03 NOTE — Progress Notes (Signed)
Pt new admit from ED alert and responsive with Hx Dementia, foley cath  For urinary rentention- bleeding around the foley cath noted.

## 2018-04-03 NOTE — ED Provider Notes (Signed)
[facility] EMERGENCY DEPARTMENT Provider Note   CSN: [id number] Arrival date & time:        History   Chief Complaint Bleeding around Foley catheter  HPI Manuel ACY Sr. is a 73 y.o. male.  The history is provided by the nursing home. The history is limited by the condition of the patient (Dementia).  He has history of hypertension, diabetes, hyperlipidemia, chronic lymphocytic leukemia and was sent here from a skilled nursing facility because of bleeding around his Foley catheter.  Patient knows he is in a hospital but does not know why he is here.  Catheter apparently had been placed last week because of urinary retention.  Past Medical History:  Diagnosis Date  . Anemia   . Diabetes ([place])   . GERD (gastroesophageal reflux disease)   . Hyperlipidemia   . Hypertension   . Leukemia [facility])     Patient Active Problem List   Diagnosis Date Noted  . Acute pancreatitis without infection or necrosis   . Hypothermia 04/10/2017  . AKI (acute kidney injury) ([place]) 04/10/2017  . Hyperkalemia 04/10/2017  . Acute metabolic encephalopathy 22/97/9892  . Dementia ([place]) 04/10/2017  . Sepsis ([place]) 04/10/2017  . HCAP (healthcare-associated pneumonia) 04/10/2017  . Rhabdomyolysis 11/14/2016  . Multiple lacunar infarcts 11/06/2016  . Small vessel disease, cerebrovascular 11/06/2016  . Syncope 09/13/2016  . Dermatitis 03/01/2016  . Anemia, pernicious 07/04/2015  . Hip pain 07/04/2015  . Aortic valve sclerosis 02/19/2015  . Chronic lymphocytic leukemia ([place]) 02/19/2015  . DM type 2 with diabetic peripheral neuropathy ([place]) 02/19/2015  . Essential (primary) hypertension 02/19/2015  . DM type 2 with diabetic mixed hyperlipidemia ([place]) 02/19/2015  . Diabetic peripheral neuropathy associated with type 2 diabetes mellitus ([place]) 02/19/2015  . Bleeding ulcer 02/19/2015  . Peripheral vascular disease ([place]) 02/19/2015  . Chronic inflammation of tunica albuginea  02/19/2015  . Encounter for screening for malignant neoplasm of prostate 02/19/2015  . Type 2 diabetes mellitus with stage 3 chronic kidney disease ([place]) 02/19/2015  . Compulsive tobacco user syndrome 02/19/2015  . 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  . Aortic valve defect 12/22/2012  . 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[place]) 11/13/2011    Past Surgical History:  Procedure Laterality Date  . CATARACT EXTRACTION W/PHACO Left 09/13/2014   Procedure: CATARACT EXTRACTION PHACO AND INTRAOCULAR LENS PLACEMENT (IOC);  Surgeon: Birder Robson, MD;  Location: [facility] ORS;  Service: Ophthalmology;  Laterality: Left;  [place] 01:03   . COLONOSCOPY  2014  . POPLITEAL ARTERY ANGIOPLASTY Left 2014  . PTCA Right    leg  . UPPER GASTROINTESTINAL ENDOSCOPY  2014        Home Medications    Prior to Admission medications   Medication Sig Start Date End Date Taking? Authorizing Provider  acetaminophen (TYLENOL) 500 MG tablet Take 500 mg by mouth every 4 (four) hours as needed for mild pain, fever or headache. NOT TO EXCEED 2000 MG IN 24 HOURS    [provider]  alum & mag hydroxide-simeth (MINTOX) 119-417-40 MG/5ML suspension Take 30 mLs by mouth every 6 (six) hours as needed for indigestion or heartburn. NOT TO EXCEED 4 DOSES IN 24 HOURS    [provider]  amLODipine (NORVASC) 10 MG tablet Take 1 tablet (10 mg total) by mouth daily. 04/15/17   [name], MD  aspirin EC 81 MG tablet Take 81 mg by mouth daily.    [provider]  atorvastatin (LIPITOR) 10 MG  tablet Take 10 mg by mouth at bedtime.     [provider]  docusate sodium (COLACE) 100 MG capsule Take 1 capsule (100 mg total) by mouth every 12 (twelve) hours. 03/26/18   Mesner, Corene Cornea, MD  gabapentin (NEURONTIN) 300 MG capsule TAKE 1 CAPSULE BY MOUTH 3  TIMES DAILY Patient taking differently: Take 300 mg by mouth 3 (three) times daily.  08/30/16   [name], MD    glucose blood test strip Use as instructed 03/06/15   [name], MD  guaiFENesin (ROBITUSSIN) 100 MG/5ML liquid Take 200 mg by mouth 3 (three) times daily as needed for cough.    [provider]  hydrALAZINE (APRESOLINE) 25 MG tablet Take 25 mg by mouth 3 (three) times daily.    [provider]  hydrALAZINE (APRESOLINE) 50 MG tablet Take 1.5 tablets (75 mg total) by mouth every 8 (eight) hours. Patient not taking: Reported on 03/28/2018 04/15/17   [name], MD  insulin glargine (LANTUS) 100 UNIT/ML injection Inject 0.12 mLs (12 Units total) into the skin daily. Patient taking differently: Inject 14 Units into the skin daily.  04/16/17   [name], MD  insulin lispro (HUMALOG KWIKPEN) 100 UNIT/ML KiwkPen Inject 3 Units into the skin 3 (three) times daily. Hold if meal time BS is 100 or less. Call MD if BS greater than 450    [provider]  Lancets 28G MISC 1 each by Does not apply route daily. 03/06/15   [name], MD  loperamide (IMODIUM) 2 MG capsule Take 2 mg by mouth as needed for diarrhea or loose stools. DO NOT EXCEED 8 DOSES IN 24 HOURS    [provider]  magnesium hydroxide (MILK OF MAGNESIA) 400 MG/5ML suspension Take 30 mLs by mouth at bedtime as needed for mild constipation or moderate constipation.    [provider]  Multiple Vitamin (MULTIVITAMIN WITH MINERALS) TABS tablet Take 1 tablet by mouth daily.    [provider]  mupirocin ointment (BACTROBAN) 2 % Place 1 application into the nose 3 (three) times daily.    [provider]  neomycin-bacitracin-polymyxin (NEOSPORIN) ointment Apply 1 application topically every 8 (eight) hours as needed for wound care.     [provider]  PARoxetine (PAXIL) 20 MG tablet Take 20 mg by mouth daily.    [provider]  polyethylene glycol (MIRALAX / GLYCOLAX) packet Take 17 g by mouth daily as needed for moderate constipation. 03/26/18    Mesner, Corene Cornea, MD  QUEtiapine (SEROQUEL) 25 MG tablet Take 1 tablet (25 mg total) by mouth at bedtime. Patient taking differently: Take 25 mg by mouth daily.  04/15/17   [name], MD  triamcinolone cream (KENALOG) 0.1 % APPLY TO AFFECTED AREA TWICE A DAY Patient taking differently: Apply 1 application topically 2 (two) times daily. APPLY TO AFFECTED AREA TWICE A DAY 08/07/16   Daleen Bo, MD    Family History Family History  Problem Relation Age of Onset  . Diabetes Mother   . Diabetes Father     Social History Social History   Tobacco Use  . Smoking status: Current Every Day Smoker    Packs/day: 0.50    Types: Cigarettes  . Smokeless tobacco: Never Used  Substance Use Topics  . Alcohol use: No    Alcohol/week: 0.0 standard drinks  . Drug use: No     Allergies   Patient has no known allergies.   Review of Systems  Review of Systems  Unable to perform ROS: Dementia     Physical Exam Updated Vital Signs BP (!) 130/36   Pulse 84   Temp (!) 101.3 F (38.5 C) (Oral)   Resp 16   Ht 5\' 8"  (1.727 m)   Wt 81.6 kg   SpO2 100%   BMI 27.35 kg/m   Physical Exam Vitals signs and nursing note reviewed.    73 year old male, resting comfortably and in no acute distress. Vital signs are significant for fever. Oxygen saturation is 100%, which is normal. Head is normocephalic and atraumatic. PERRLA, EOMI. Oropharynx is clear. Neck is nontender and supple without adenopathy or JVD. Back is nontender and there is no CVA tenderness. Lungs are clear without rales, wheezes, or rhonchi. Chest is nontender. Heart has regular rate and rhythm with 3/6 harsh systolic ejection murmur best heard at the left sternal border. Abdomen is soft, flat, nontender without masses or hepatosplenomegaly and peristalsis is normoactive. Genitalia: Uncircumcised penis.  Foley catheter in place.  Some bright red blood noted on the Foley catheter without any active bleeding. Extremities  have no cyanosis or edema, full range of motion is present. Skin is warm and dry without rash. Neurologic: Awake and alert and oriented to person and place but not time, cranial nerves are intact, there are no motor or sensory deficits.  ED Treatments / Results  Labs (all labs ordered are listed, but only abnormal results are displayed) Labs Reviewed  BASIC METABOLIC PANEL - Abnormal; Notable for the following components:      Result Value   Potassium 5.5 (*)    Glucose, Bld 170 (*)    BUN 26 (*)    Creatinine, Ser 2.16 (*)    GFR calc non Af Amer 30 (*)    GFR calc Af Amer 34 (*)    All other components within normal limits  CBC - Abnormal; Notable for the following components:   WBC 13.3 (*)    RBC 3.32 (*)    Hemoglobin 9.9 (*)    HCT 31.4 (*)    All other components within normal limits  URINE CULTURE  URINALYSIS, ROUTINE W REFLEX MICROSCOPIC    EKG None  Radiology Dg Chest 2 View  Result Date: 04/01/2018 CLINICAL DATA:  73 year old male with history of tremors lasting 10-15 seconds. No loss of consciousness. EXAM: CHEST - 2 VIEW COMPARISON:  Chest x-ray 09/29/2017. FINDINGS: Low lung volumes. Interstitial prominence throughout the mid to lower lungs bilaterally, similar to several prior examinations. No confluent consolidative airspace disease. No pleural effusions. No evidence of pulmonary edema. Heart size is normal. Upper mediastinal contours are within normal limits. Aortic atherosclerosis. IMPRESSION: 1. Low lung volumes with interstitial prominence throughout the mid to lower lungs bilaterally. Findings are concerning for underlying interstitial lung disease. Follow-up nonemergent high-resolution chest CT is suggested to better evaluate these findings. 2. Aortic atherosclerosis. Electronically Signed   By: Vinnie Langton M.D.   On: 04/01/2018 11:01   Dg Abdomen 1 View  Result Date: 04/01/2018 CLINICAL DATA:  Evaluate for pneumoperitoneum EXAM: ABDOMEN - 1 VIEW  COMPARISON:  Chest x-ray from earlier today FINDINGS: No evident pneumoperitoneum. There is a large volume of stool, especially at the rectum where there is distention to 11 cm. No dilated small bowel. No concerning mass effect. Diffuse atherosclerotic calcification. IMPRESSION: 1. No evidence of free air. 2. Generalized stool retention with rectal distention to 11 cm. Electronically Signed   By: Neva Seat.D.  On: 04/01/2018 13:41   Ct Head Wo Contrast  Result Date: 04/01/2018 CLINICAL DATA:  73 year old with focal neuro deficit. Stroke suspected. New onset of tremors. EXAM: CT HEAD WITHOUT CONTRAST TECHNIQUE: Contiguous axial images were obtained from the base of the skull through the vertex without intravenous contrast. COMPARISON:  06/17/2017 FINDINGS: Brain: Stable cerebral atrophy. Stable areas of low-density in the left basal ganglia compatible with old lacune infarcts. Scattered areas of low density throughout the subcortical and periventricular white matter compatible with chronic ischemic changes. White matter disease has not significantly changed. Negative for acute hemorrhage, mass lesion, midline shift, hydrocephalus or new large infarct. Vascular: No hyperdense vessel or unexpected calcification. Skull: Normal. Negative for fracture or focal lesion. Sinuses/Orbits: Mucosal thickening in the ethmoid air cells. Mucosal disease in the bilateral maxillary sinuses. Other: None. IMPRESSION: 1. No acute intracranial abnormality. 2. Stable atrophy and evidence for chronic small vessel ischemic disease and remote lacune infarcts. Electronically Signed   By: Markus Daft M.D.   On: 04/01/2018 11:22    Procedures Procedures  CRITICAL CARE Performed by: Delora Fuel Total critical care time: 45 minutes Critical care time was exclusive of separately billable procedures and treating other patients. Critical care was necessary to treat or prevent imminent or life-threatening deterioration. Critical  care was time spent personally by me on the following activities: development of treatment plan with patient and/or surrogate as well as nursing, discussions with consultants, evaluation of patient's response to treatment, examination of patient, obtaining history from patient or surrogate, ordering and performing treatments and interventions, ordering and review of laboratory studies, ordering and review of radiographic studies, pulse oximetry and re-evaluation of patient's condition.  Medications Ordered in ED Medications - No data to display   Initial Impression / Assessment and Plan / ED Course  I have reviewed the triage vital signs and the nursing notes.  Pertinent labs & imaging results that were available during my care of the patient were reviewed by me and considered in my medical decision making (see chart for details).  Fever with other vital signs not suggesting overt sepsis.  Urethral bleeding around Foley catheter.  Urine appears clear.  Will replace with a larger catheter to try to achieve hemostasis.  Old records are reviewed confirming recent ED visit for your urinary retention.  Labs today show a rising creatinine compared with ED visit yesterday, but creatinine level is about the same as it was 1 week ago.  He does have a prior admission for pneumonia, but chest x-ray yesterday showed no evidence of pneumonia.  Will need to repeat chest x-ray today.  He will be started on antibiotics for sepsis of undetermined origin.  Chest x-ray shows no evidence of pneumonia, probable interstitial lung disease.  Urinalysis shows blood, but no evidence of infection.  Lactic acid level is normal.  Anemia is present and not significantly changed from baseline.  Case is discussed with Dr. Tamala Julian of [facility], who agrees to admit the patient.  Final Clinical Impressions(s) / ED Diagnoses   Final diagnoses:  Sepsis, due to unspecified organism, unspecified whether acute organ dysfunction  present [facility])  Urethral bleeding  Acute kidney injury (nontraumatic) (HCC)  Normochromic normocytic anemia    ED Discharge Orders    None       Delora Fuel, MD 14/43/15 [phone]

## 2018-04-03 NOTE — Progress Notes (Signed)
Pharmacy Antibiotic Note  Manuel Oscar. is a 73 y.o. male admitted on 04/02/2018 with FUO.  Pharmacy has been consulted for vancomycin/cefepime dosing. Tmax/24h 101.3, WBC 13.3, LA 1.3>1.12. SCr up to 2.16 (1.79 on admit), CrCl~30.  Received vancomycin 1g IV x 1 and cefepime 2g IV x 1 in the ED overnight.  Plan: Cefepime 1g IV q24h Vancomycin 1g IV q24h - will move dose up a bit due to less than adequate loading dose Flagyl 500mg  IV q8h per MD Monitor clinical progress, c/s, renal function F/u de-escalation plan/LOT, vancomycin trough as indicated   Height: 5\' 8"  (172.7 cm) Weight: 179 lb 14.3 oz (81.6 kg) IBW/kg (Calculated) : 68.4  Temp (24hrs), Avg:100.7 F (38.2 C), Min:99.3 F (37.4 C), Max:101.3 F (38.5 C)  Recent Labs  Lab 04/01/18 1008 04/01/18 1014 04/02/18 2237 04/03/18 0054  WBC 9.8  --  13.3*  --   CREATININE 1.79*  --  2.16*  --   LATICACIDVEN  --  1.30  --  1.12    Estimated Creatinine Clearance: 30.3 mL/min (A) (by C-G formula based on SCr of 2.16 mg/dL (H)).    No Known Allergies  Antimicrobials this admission: 1/3 vancomycin >>  1/3 cefepime >>  1/3 flagyl >>  Dose adjustments this admission:   Microbiology results:   Elicia Lamp, PharmD, BCPS Clinical Pharmacist 04/03/2018 8:49 AM

## 2018-04-03 NOTE — H&P (Signed)
History and Physical    Manuel Randolph UDJ:[id number] DOB: 02-26-1946 DOA: 04/02/2018  PCP: System, Pcp Not In  Patient coming from: Nursing home  Chief Complaint: Blood around Foley  HPI: Manuel Randolph. is a 73 y.o. male with medical history significant of dementia, HTN, HLD comes in from SNF for blood around catheter that was placed last week for urinary retention.  Pt cannot provide any history at this time.  Pt found to have temp over 101 on arrival so therefore had septic work up.  No report of n/v/d from snf.  Or cough.  Pt referred for admission for fever possible uti.  Review of Systems: Unobtainable secondary to patient's dementia  Past Medical History:  Diagnosis Date  . Anemia   . Diabetes ([place])   . GERD (gastroesophageal reflux disease)   . Hyperlipidemia   . Hypertension   . Leukemia [facility])     Past Surgical History:  Procedure Laterality Date  . CATARACT EXTRACTION W/PHACO Left 09/13/2014   Procedure: CATARACT EXTRACTION PHACO AND INTRAOCULAR LENS PLACEMENT (IOC);  Surgeon: Birder Robson, MD;  Location: [facility] ORS;  Service: Ophthalmology;  Laterality: Left;  [place] 01:03   . COLONOSCOPY  2014  . POPLITEAL ARTERY ANGIOPLASTY Left 2014  . PTCA Right    leg  . UPPER GASTROINTESTINAL ENDOSCOPY  2014     reports that he has been smoking cigarettes. He has been smoking about 0.50 packs per day. He has never used smokeless tobacco. He reports that he does not drink alcohol or use drugs.  No Known Allergies  Family History  Problem Relation Age of Onset  . Diabetes Mother   . Diabetes Father     Prior to Admission medications   Medication Sig Start Date End Date Taking? Authorizing Provider  acetaminophen (TYLENOL) 500 MG tablet Take 500 mg by mouth every 4 (four) hours as needed for mild pain, fever or headache. NOT TO EXCEED 2000 MG IN 24 HOURS   Yes [provider]  alum & mag hydroxide-simeth (MINTOX) 588-502-77 MG/5ML suspension Take 30 mLs  by mouth every 6 (six) hours as needed for indigestion or heartburn. NOT TO EXCEED 4 DOSES IN 24 HOURS   Yes [provider]  amLODipine (NORVASC) 10 MG tablet Take 1 tablet (10 mg total) by mouth daily. 04/15/17  Yes [name], MD  aspirin EC 81 MG tablet Take 81 mg by mouth daily.   Yes [provider]  atorvastatin (LIPITOR) 10 MG tablet Take 10 mg by mouth at bedtime.    Yes [provider]  docusate sodium (COLACE) 100 MG capsule Take 1 capsule (100 mg total) by mouth every 12 (twelve) hours. 03/26/18  Yes Mesner, Corene Cornea, MD  gabapentin (NEURONTIN) 300 MG capsule TAKE 1 CAPSULE BY MOUTH 3  TIMES DAILY Patient taking differently: Take 300 mg by mouth 3 (three) times daily.  08/30/16  Yes [name], MD  glucose blood test strip Use as instructed 03/06/15  Yes [name], MD  guaiFENesin (ROBITUSSIN) 100 MG/5ML liquid Take 100 mg by mouth 3 (three) times daily as needed for cough.    Yes [provider]  hydrALAZINE (APRESOLINE) 25 MG tablet Take 25 mg by mouth 3 (three) times daily. Take 25 mg three times a day and 75 mg every 8 hours.   Yes [provider]  hydrALAZINE (APRESOLINE) 50 MG tablet Take 1.5 tablets (75 mg total) by mouth every 8 (eight) hours. 04/15/17  Yes [name], MD  insulin glargine (LANTUS) 100 UNIT/ML injection Inject 0.12 mLs (12 Units total) into the skin daily. 04/16/17  Yes [name], MD  insulin lispro (HUMALOG KWIKPEN) 100 UNIT/ML KiwkPen Inject 3 Units into the skin 3 (three) times daily. Hold if meal time BS is 100 or less. Call MD if BS greater than 450   Yes [provider]  Lancets 28G MISC 1 each by Does not apply route daily. 03/06/15  Yes [name], MD  loperamide (IMODIUM) 2 MG capsule Take 2 mg by mouth as needed for diarrhea or loose stools. DO NOT EXCEED 8 DOSES IN 24 HOURS   Yes [provider]  magnesium hydroxide (MILK OF MAGNESIA) 400 MG/5ML suspension  Take 30 mLs by mouth at bedtime as needed for mild constipation or moderate constipation.   Yes [provider]  Multiple Vitamin (MULTIVITAMIN WITH MINERALS) TABS tablet Take 1 tablet by mouth daily.   Yes [provider]  mupirocin ointment (BACTROBAN) 2 % Place 1 application into the nose daily.    Yes [provider]  neomycin-bacitracin-polymyxin (NEOSPORIN) ointment Apply 1 application topically every 8 (eight) hours as needed for wound care.    Yes [provider]  PARoxetine (PAXIL) 20 MG tablet Take 20 mg by mouth daily.   Yes [provider]  polyethylene glycol (MIRALAX / GLYCOLAX) packet Take 17 g by mouth daily as needed for moderate constipation. 03/26/18  Yes Mesner, Corene Cornea, MD  QUEtiapine (SEROQUEL) 25 MG tablet Take 1 tablet (25 mg total) by mouth at bedtime. 04/15/17  Yes [name], MD  triamcinolone cream (KENALOG) 0.1 % APPLY TO AFFECTED AREA TWICE A DAY Patient taking differently: Apply 1 application topically 2 (two) times daily. APPLY TO AFFECTED AREA TWICE A DAY 08/07/16  Yes Daleen Bo, MD    Physical Exam: Vitals:   04/03/18 0700 04/03/18 0712 04/03/18 0715 04/03/18 0730  BP: (!) 121/58  124/66 (!) 136/53  Pulse: 70  71 65  Resp:      Temp:  99.3 F (37.4 C)    TempSrc:  Axillary    SpO2: 99%  100% 100%  Weight:      Height:        Constitutional: NAD, calm, comfortable pleasantly demented Vitals:   04/03/18 0700 04/03/18 0712 04/03/18 0715 04/03/18 0730  BP: (!) 121/58  124/66 (!) 136/53  Pulse: 70  71 65  Resp:      Temp:  99.3 F (37.4 C)    TempSrc:  Axillary    SpO2: 99%  100% 100%  Weight:      Height:       Eyes: PERRL, lids and conjunctivae normal ENMT: Mucous membranes are moist. Posterior pharynx clear of any exudate or lesions.Normal dentition.  Neck: normal, supple, no masses, no thyromegaly Respiratory: clear to auscultation bilaterally, no wheezing, no crackles. Normal respiratory  effort. No accessory muscle use.  Cardiovascular: Regular rate and rhythm, no murmurs / rubs / gallops. No extremity edema. 2+ pedal pulses. No carotid bruits.  Abdomen: no tenderness, no masses palpated. No hepatosplenomegaly. Bowel sounds positive.  Musculoskeletal: no clubbing / cyanosis. No joint deformity upper and lower extremities. Good ROM, no contractures. Normal muscle tone.  Skin: no rashes, lesions, ulcers. No induration Neurologic: CN 2-12 grossly intact. Sensation intact, DTR normal. Strength 5/5 in all 4.  Psychiatric: No agitation calm poor insight and judgment due to dementia   Labs on Admission: I have  personally reviewed following labs and imaging studies  CBC: Recent Labs  Lab 04/01/18 1008 04/02/18 2237  WBC 9.8 13.3*  NEUTROABS 7.5  --   HGB 9.6* 9.9*  HCT 32.1* 31.4*  MCV 94.4 94.6  PLT 191 341   Basic Metabolic Panel: Recent Labs  Lab 04/01/18 1008 04/02/18 2237  NA 140 140  K 4.5 5.5*  CL 106 105  CO2 24 25  GLUCOSE 218* 170*  BUN 26* 26*  CREATININE 1.79* 2.16*  CALCIUM 9.1 9.2  MG 2.0  --    GFR: Estimated Creatinine Clearance: 30.3 mL/min (A) (by C-G formula based on SCr of 2.16 mg/dL (H)). Liver Function Tests: Recent Labs  Lab 04/01/18 1008 04/03/18 0032  AST 15 15  ALT 12 12  ALKPHOS 93 96  BILITOT 0.3 0.2*  PROT 7.1 6.8  ALBUMIN 3.3* 3.0*   No results for input(s): LIPASE, AMYLASE in the last 168 hours. No results for input(s): AMMONIA in the last 168 hours. Coagulation Profile: No results for input(s): INR, PROTIME in the last 168 hours. Cardiac Enzymes: No results for input(s): CKTOTAL, CKMB, CKMBINDEX, TROPONINI in the last 168 hours. BNP (last 3 results) No results for input(s): PROBNP in the last 8760 hours. HbA1C: No results for input(s): HGBA1C in the last 72 hours. CBG: No results for input(s): GLUCAP in the last 168 hours. Lipid Profile: No results for input(s): CHOL, HDL, LDLCALC, TRIG, CHOLHDL, LDLDIRECT in  the last 72 hours. Thyroid Function Tests: No results for input(s): TSH, T4TOTAL, FREET4, T3FREE, THYROIDAB in the last 72 hours. Anemia Panel: No results for input(s): VITAMINB12, FOLATE, FERRITIN, TIBC, IRON, RETICCTPCT in the last 72 hours. Urine analysis:    Component Value Date/Time   COLORURINE YELLOW 04/02/2018 2326   APPEARANCEUR CLEAR 04/02/2018 2326   APPEARANCEUR Clear 02/18/2013 0735   LABSPEC 1.015 04/02/2018 2326   LABSPEC 1.008 02/18/2013 0735   PHURINE 6.5 04/02/2018 2326   GLUCOSEU NEGATIVE 04/02/2018 2326   GLUCOSEU Negative 02/18/2013 0735   HGBUR MODERATE (A) 04/02/2018 2326   BILIRUBINUR NEGATIVE 04/02/2018 2326   BILIRUBINUR Negative 02/18/2013 [place] 04/02/2018 2326   PROTEINUR 100 (A) 04/02/2018 2326   NITRITE NEGATIVE 04/02/2018 2326   LEUKOCYTESUR TRACE (A) 04/02/2018 2326   LEUKOCYTESUR Negative 02/18/2013 0735   Sepsis Labs: !!!!!!!!!!!!!!!!!!!!!!!!!!!!!!!!!!!!!!!!!!!! @LABRCNTIP (procalcitonin:4,lacticidven:4) ) Recent Results (from the past 240 hour(s))  Blood Culture (routine x 2)     Status: None (Preliminary result)   Collection Time: 04/01/18 10:23 AM  Result Value Ref Range Status   Specimen Description BLOOD RIGHT ANTECUBITAL  Final   Special Requests   Final    BOTTLES DRAWN AEROBIC AND ANAEROBIC Blood Culture adequate volume   Culture   Final    NO GROWTH 2 DAYS Performed at [facility], [place] [address] [place], [place] [address]    Report Status PENDING  Incomplete  Blood Culture (routine x 2)     Status: None (Preliminary result)   Collection Time: 04/01/18 10:23 AM  Result Value Ref Range Status   Specimen Description BLOOD BLOOD RIGHT FOREARM  Final   Special Requests   Final    AEROBIC BOTTLE ONLY Blood Culture results may not be optimal due to an inadequate volume of blood received in culture bottles   Culture   Final    NO GROWTH 2 DAYS Performed at [facility], [place] [address],  [place],  [address]    Report Status PENDING  Incomplete  Blood Culture (routine x 2)     Status: None (Preliminary result)   Collection Time: 04/03/18 12:32 AM  Result Value Ref Range Status   Specimen Description BLOOD RIGHT WRIST  Final   Special Requests   Final    BOTTLES DRAWN AEROBIC AND ANAEROBIC Blood Culture results may not be optimal due to an excessive volume of blood received in culture bottles   Culture   Final    NO GROWTH < 12 HOURS Performed at [facility], [address] [place], [place] [address]    Report Status PENDING  Incomplete  Blood Culture (routine x 2)     Status: None (Preliminary result)   Collection Time: 04/03/18 12:43 AM  Result Value Ref Range Status   Specimen Description BLOOD LEFT WRIST  Final   Special Requests   Final    BOTTLES DRAWN AEROBIC AND ANAEROBIC Blood Culture results may not be optimal due to an excessive volume of blood received in culture bottles   Culture   Final    NO GROWTH < 12 HOURS Performed at [facility], [place] [address], [place], [place] [address]    Report Status PENDING  Incomplete     Radiological Exams on Admission: Dg Chest 2 View  Result Date: 04/01/2018 CLINICAL DATA:  73 year old male with history of tremors lasting 10-15 seconds. No loss of consciousness. EXAM: CHEST - 2 VIEW COMPARISON:  Chest x-ray 09/29/2017. FINDINGS: Low lung volumes. Interstitial prominence throughout the mid to lower lungs bilaterally, similar to several prior examinations. No confluent consolidative airspace disease. No pleural effusions. No evidence of pulmonary edema. Heart size is normal. Upper mediastinal contours are within normal limits. Aortic atherosclerosis. IMPRESSION: 1. Low lung volumes with interstitial prominence throughout the mid to lower lungs bilaterally. Findings are concerning for underlying interstitial lung disease. Follow-up nonemergent high-resolution chest CT is suggested to better evaluate these  findings. 2. Aortic atherosclerosis. Electronically Signed   By: Vinnie Langton M.D.   On: 04/01/2018 11:01   Dg Abdomen 1 View  Result Date: 04/01/2018 CLINICAL DATA:  Evaluate for pneumoperitoneum EXAM: ABDOMEN - 1 VIEW COMPARISON:  Chest x-ray from earlier today FINDINGS: No evident pneumoperitoneum. There is a large volume of stool, especially at the rectum where there is distention to 11 cm. No dilated small bowel. No concerning mass effect. Diffuse atherosclerotic calcification. IMPRESSION: 1. No evidence of free air. 2. Generalized stool retention with rectal distention to 11 cm. Electronically Signed   By: Monte Fantasia M.D.   On: 04/01/2018 13:41   Ct Head Wo Contrast  Result Date: 04/01/2018 CLINICAL DATA:  73 year old with focal neuro deficit. Stroke suspected. New onset of tremors. EXAM: CT HEAD WITHOUT CONTRAST TECHNIQUE: Contiguous axial images were obtained from the base of the skull through the vertex without intravenous contrast. COMPARISON:  06/17/2017 FINDINGS: Brain: Stable cerebral atrophy. Stable areas of low-density in the left basal ganglia compatible with old lacune infarcts. Scattered areas of low density throughout the subcortical and periventricular white matter compatible with chronic ischemic changes. White matter disease has not significantly changed. Negative for acute hemorrhage, mass lesion, midline shift, hydrocephalus or new large infarct. Vascular: No hyperdense vessel or unexpected calcification. Skull: Normal. Negative for fracture or focal lesion. Sinuses/Orbits: Mucosal thickening in the ethmoid air cells. Mucosal disease in the bilateral maxillary sinuses. Other: None. IMPRESSION: 1. No acute intracranial abnormality. 2. Stable atrophy and evidence for chronic small vessel ischemic disease and remote lacune infarcts. Electronically Signed  By: Markus Daft M.D.   On: 04/01/2018 11:22   Dg Chest Port 1 View  Result Date: 04/03/2018 CLINICAL DATA:  Fever. EXAM:  PORTABLE CHEST 1 VIEW COMPARISON:  Radiograph 2 days prior 04/01/2018 FINDINGS: Unchanged heart size and mediastinal contours with aortic atherosclerosis. Similar interstitial prominence to prior exams, most conspicuous at the left lung base. No new airspace disease. No pleural effusion or pneumothorax. IMPRESSION: 1. Similar interstitial prominence to prior exams suggesting interstitial lung disease. 2. No new abnormalities. Electronically Signed   By: Keith Rake M.D.   On: 04/03/2018 01:02    Old chart reviewed cxr reviewed no edema or infiltrate    Assessment/Plan 73 year old demented male comes in with blood around his Foley cath found to have a fever of unclear source Principal Problem:   SIRS (systemic inflammatory response syndrome) (HCC)-likely from urine.  Empirically placed on Vanco and cefepime until urine cultures are back.  Influenza panel is pending.  Lactic acid normal.  Vitals normal.  Active Problems:   Hyperkalemia-received IV fluids in the emergency department.  Potassium mildly elevated at 5.5.  Repeat BMP now    Acute lower UTI-likely source.  Continue Vanco and cefepime until culture data available    Acute kidney injury superimposed on CKD (HCC)-Baseline creatinine around 1.7.  Currently 2.  Provide IV fluids in the emergency department.  Repeat BMP in the morning.    Chronic lymphocytic leukemia (HCC)-noted and stable    Essential (primary) hypertension-continue home meds    DM type 2 with diabetic mixed hyperlipidemia (HCC)-continue his home insulin dosing    Peripheral vascular disease (HCC)-noted stable    Arteriovenous malformation of small intestine-no evidence of GI bleed at this time    Dementia (HCC)-noted  Blood around Foley catheter-catheter has been changed out in the ED.  Monitor     DVT prophylaxis: SCDs Code Status: Full Family Communication: None  Disposition Plan: 1 to 3 days Consults called: None Admission status:  Admission   Llewellyn Schoenberger A MD [facility]  If 7PM-7AM, please contact night-coverage [email or web address] Password [facility]  04/03/2018, 7:56 AM

## 2018-04-04 DIAGNOSIS — R6510 Systemic inflammatory response syndrome (SIRS) of non-infectious origin without acute organ dysfunction: Secondary | ICD-10-CM

## 2018-04-04 LAB — URINE CULTURE: Culture: NO GROWTH

## 2018-04-04 LAB — CBC
HCT: 27.8 % — ABNORMAL LOW (ref 39.0–52.0)
HEMOGLOBIN: 8.7 g/dL — AB (ref 13.0–17.0)
MCH: 29.1 pg (ref 26.0–34.0)
MCHC: 31.3 g/dL (ref 30.0–36.0)
MCV: 93 fL (ref 80.0–100.0)
Platelets: 233 10*3/uL (ref 150–400)
RBC: 2.99 MIL/uL — ABNORMAL LOW (ref 4.22–5.81)
RDW: 13.3 % (ref 11.5–15.5)
WBC: 12.5 10*3/uL — ABNORMAL HIGH (ref 4.0–10.5)
nRBC: 0 % (ref 0.0–0.2)

## 2018-04-04 LAB — BASIC METABOLIC PANEL
Anion gap: 9 (ref 5–15)
BUN: 26 mg/dL — ABNORMAL HIGH (ref 8–23)
CO2: 23 mmol/L (ref 22–32)
Calcium: 8.8 mg/dL — ABNORMAL LOW (ref 8.9–10.3)
Chloride: 108 mmol/L (ref 98–111)
Creatinine, Ser: 1.92 mg/dL — ABNORMAL HIGH (ref 0.61–1.24)
GFR calc Af Amer: 40 mL/min — ABNORMAL LOW (ref >60)
GFR calc non Af Amer: 34 mL/min — ABNORMAL LOW (ref >60)
GLUCOSE: 128 mg/dL — AB (ref 70–99)
POTASSIUM: 4.2 mmol/L (ref 3.5–5.1)
Sodium: 140 mmol/L (ref 135–145)

## 2018-04-04 LAB — RESPIRATORY PANEL BY PCR
Adenovirus: NOT DETECTED
Bordetella pertussis: NOT DETECTED
CORONAVIRUS 229E-RVPPCR: NOT DETECTED
CORONAVIRUS OC43-RVPPCR: NOT DETECTED
Chlamydophila pneumoniae: NOT DETECTED
Coronavirus HKU1: NOT DETECTED
Coronavirus NL63: NOT DETECTED
Influenza A: NOT DETECTED
Influenza B: NOT DETECTED
MYCOPLASMA PNEUMONIAE-RVPPCR: NOT DETECTED
Metapneumovirus: NOT DETECTED
Parainfluenza Virus 1: NOT DETECTED
Parainfluenza Virus 2: NOT DETECTED
Parainfluenza Virus 3: NOT DETECTED
Parainfluenza Virus 4: NOT DETECTED
Respiratory Syncytial Virus: NOT DETECTED
Rhinovirus / Enterovirus: NOT DETECTED

## 2018-04-04 LAB — GLUCOSE, CAPILLARY
GLUCOSE-CAPILLARY: 61 mg/dL — AB (ref 70–99)
Glucose-Capillary: 112 mg/dL — ABNORMAL HIGH (ref 70–99)
Glucose-Capillary: 200 mg/dL — ABNORMAL HIGH (ref 70–99)
Glucose-Capillary: 185 mg/dL — ABNORMAL HIGH (ref 70–99)

## 2018-04-04 MED ORDER — SODIUM CHLORIDE 0.9 % IV SOLN
INTRAVENOUS | Status: DC
  Administered 2018-04-04 – 2018-04-06 (×4): via INTRAVENOUS

## 2018-04-04 NOTE — Evaluation (Signed)
Physical Therapy Evaluation Patient Details Name: Manuel SHALLENBERGER Sr. MRN: [id number] DOB: Mar 28, 1946 Today's Date: 04/04/2018   History of Present Illness  73 year old male with past medical history of dementia, hypertension, hyperlipidemia who was sent from skilled nursing facility with complaints of blood around the urinary catheter that was placed last week for urinary retention. Pt admitted with dx of SIRS.     Clinical Impression  Pt admitted with above diagnosis. Pt currently with functional limitations due to the deficits listed below (see PT Problem List). On eval, pt required min/mod assist bed mobility. Pt very lethargic and declining progression beyond EOB. He would immediately return to sidelying in bed after assisting to sit. Pt will benefit from skilled PT to increase their independence and safety with mobility to allow discharge to the venue listed below.       Follow Up Recommendations SNF    Equipment Recommendations  Other (comment)(defer to next venue)    Recommendations for Other Services       Precautions / Restrictions Precautions Precautions: Fall      Mobility  Bed Mobility Overal bed mobility: Needs Assistance Bed Mobility: Supine to Sit;Sit to Supine     Supine to sit: Mod assist;HOB elevated Sit to supine: HOB elevated;Min assist   General bed mobility comments: Pt immediately returning to supine after transition to EOB.  Transfers                 General transfer comment: not assessed. Pt declining progression beyond EOB.   Ambulation/Gait             General Gait Details: not assessed  Stairs            Wheelchair Mobility    Modified Rankin (Stroke Patients Only)       Balance                                             Pertinent Vitals/Pain Pain Assessment: Faces Faces Pain Scale: Hurts a little bit Pain Location: generalized with mobility, unable to specify Pain Descriptors /  Indicators: Grimacing;Moaning Pain Intervention(s): Limited activity within patient's tolerance    Home Living Family/patient expects to be discharged to:: Skilled nursing facility                 Additional Comments: [place]    Prior Function Level of Independence: Needs assistance               Hand Dominance        Extremity/Trunk Assessment             Cervical / Trunk Assessment Cervical / Trunk Assessment: Kyphotic  Communication      Cognition Arousal/Alertness: Lethargic Behavior During Therapy: Flat affect Overall Cognitive Status: No family/caregiver present to determine baseline cognitive functioning                                 General Comments: dementia      General Comments      Exercises     Assessment/Plan    PT Assessment Patient needs continued PT services  PT Problem List Decreased balance;Decreased cognition;Pain;Decreased mobility;Decreased activity tolerance;Decreased safety awareness       PT Treatment Interventions Functional mobility training;Balance training;DME instruction;Gait training;Therapeutic activities;Stair training;Therapeutic exercise  PT Goals (Current goals can be found in the Care Plan section)  Acute Rehab PT Goals Patient Stated Goal: not stated PT Goal Formulation: Patient unable to participate in goal setting Time For Goal Achievement: 04/18/18 Potential to Achieve Goals: Fair    Frequency Min 2X/week   Barriers to discharge        Co-evaluation               AM-PAC PT "6 Clicks" Mobility  Outcome Measure Help needed turning from your back to your side while in a flat bed without using bedrails?: A Little Help needed moving from lying on your back to sitting on the side of a flat bed without using bedrails?: A Lot Help needed moving to and from a bed to a chair (including a wheelchair)?: A Lot Help needed standing up from a chair using your arms  (e.g., wheelchair or bedside chair)?: A Lot Help needed to walk in hospital room?: Total Help needed climbing 3-5 steps with a railing? : Total 6 Click Score: 11    End of Session   Activity Tolerance: Patient limited by lethargy Patient left: in bed;with call bell/phone within reach;with bed alarm set   PT Visit Diagnosis: Other abnormalities of gait and mobility (R26.89)    Time: 9741-6384 PT Time Calculation (min) (ACUTE ONLY): 10 min   Charges:   PT Evaluation $PT Eval Moderate Complexity: 1 Mod          Lorrin Goodell, PT  Office # [phone] Pager [phone]   Lorriane Shire 04/04/2018, 4:10 PM

## 2018-04-04 NOTE — Progress Notes (Addendum)
PROGRESS NOTE    Manuel Randolph  ZOX:[id number] DOB: Aug 06, 1945 DOA: 04/02/2018 PCP: System, Pcp Not In   Brief Narrative: Patient is a 73 year old male with past medical history of dementia, hypertension, hyperlipidemia who was sent from skilled nursing facility with complaints of blood around the urinary catheter that was placed last week for urinary retention.  Patient is not able to provide any history due to his advanced dementia.  He was also found to be febrile with fever of 101 on arrival.  He was admitted for septic work-up.  Started on broad-spectrum antibiotics.  Assessment & Plan:   Principal Problem:   SIRS (systemic inflammatory response syndrome) (HCC) Active Problems:   Chronic lymphocytic leukemia (HCC)   Essential (primary) hypertension   DM type 2 with diabetic mixed hyperlipidemia (HCC)   Peripheral vascular disease (HCC)   Arteriovenous malformation of small intestine   Hyperkalemia   Dementia (HCC)   Acute lower UTI   Acute kidney injury superimposed on CKD ([place])  Suspected sepsis: Unknown source at this time.  Urine culture did not reveal any growth.  Empirically placed on empiric antibiotics. Currently he is hemodynamically stable.  Fever of 99 degrees this morning.  Blood cultures were sent.  MRSA PCR negative.  Has mild leukocytosis. Continue Vanco and cefepime for now.  Will discontinue Flagyl.  Will monitor him for 1-2 more day.  If culture remains negative till tomorrow or day after tomorrow,we will discharge him.  Hyperkalemia: Resolved  Suspected urinary tract infection: Urine culture did not reveal any growth.  Acute kidney injury superimposed on CKD: Baseline creatinine around 1.7.  Started him on gentle IV fluids.  History of chronic normocytic anemia: Baseline hemoglobin around 9-10.  We will continue to monitor CBC.  History of chronic lymphocytic leukemia: Currently stable  History of hypertension: Currently blood pressure stable.   Continue home meds  Diabetes type 2: Continue sliding-scale [place].  History of peripheral vascular disease: Currently stable  History of atrial venous malformation :no evidence of bleeding at this time.  Dementia: Looks like pretty advanced.  Continue supportive care.  Continue Seroquel.  History of recent urinary retention: Foley was placed about a week ago in the nursing facility.  There was noted to be blood around the Foley.  Urine culture was also suspected.  Foley has been changed here.           DVT prophylaxis: SCD Code Status: Full code Family Communication: None present at the bedside Disposition Plan: Back to nursing facility in 1 to 2 days   Consultants: None  Procedures: None  Antimicrobials: Vancomycin, cefepime day 2  Subjective: Patient seen and examined the bedside this morning.  Has advanced dementia.  Unable to contribute to history.  Looks comfortable.  Bit agitated during my evaluation.  Objective: Vitals:   04/03/18 1342 04/03/18 2146 04/04/18 0628 04/04/18 1042  BP: (!) 138/42 136/65 (!) 136/49 (!) 120/49  Pulse: 70 80 75   Resp:  16 17   Temp: 98.1 F (36.7 C) 99.3 F (37.4 C) 99 F (37.2 C)   TempSrc: Oral Oral Oral   SpO2:  96% 100%   Weight:      Height:        Intake/Output Summary (Last 24 hours) at 04/04/2018 1339 Last data filed at 04/04/2018 0900 Gross per 24 hour  Intake 958.03 ml  Output 2000 ml  Net -1041.97 ml   Filed Weights   04/02/18 2219 04/03/18 0900  Weight: 81.6  kg 80 kg    Examination:  General exam:Not in distress,average built HEENT:PERRL,Oral mucosa moist, Ear/Nose normal on gross exam Respiratory system: Bilateral equal air entry, normal vesicular breath sounds, no wheezes or crackles  Cardiovascular system: S1 & S2 heard, RRR. No JVD, murmurs, rubs, gallops or clicks. No pedal edema. Gastrointestinal system: Abdomen is nondistended, soft and nontender. No organomegaly or masses felt. Normal bowel  sounds heard. Central nervous system: Not alert or  oriented. No focal neurological deficits. Extremities: No edema, no clubbing ,no cyanosis, distal peripheral pulses palpable. Skin: No rashes, lesions or ulcers,no icterus ,no pallor    Data Reviewed: I have personally reviewed following labs and imaging studies  CBC: Recent Labs  Lab 04/01/18 1008 04/02/18 2237 04/04/18 0213  WBC 9.8 13.3* 12.5*  NEUTROABS 7.5  --   --   HGB 9.6* 9.9* 8.7*  HCT 32.1* 31.4* 27.8*  MCV 94.4 94.6 93.0  PLT 191 240 981   Basic Metabolic Panel: Recent Labs  Lab 04/01/18 1008 04/02/18 2237 04/03/18 1136 04/04/18 0213  NA 140 140 138 140  K 4.5 5.5* 4.7 4.2  CL 106 105 109 108  CO2 24 25 23 23   GLUCOSE 218* 170* 272* 128*  BUN 26* 26* 26* 26*  CREATININE 1.79* 2.16* 2.03* 1.92*  CALCIUM 9.1 9.2 8.5* 8.8*  MG 2.0  --   --   --    GFR: Estimated Creatinine Clearance: 34.1 mL/min (A) (by C-G formula based on SCr of 1.92 mg/dL (H)). Liver Function Tests: Recent Labs  Lab 04/01/18 1008 04/03/18 0032  AST 15 15  ALT 12 12  ALKPHOS 93 96  BILITOT 0.3 0.2*  PROT 7.1 6.8  ALBUMIN 3.3* 3.0*   No results for input(s): LIPASE, AMYLASE in the last 168 hours. No results for input(s): AMMONIA in the last 168 hours. Coagulation Profile: No results for input(s): INR, PROTIME in the last 168 hours. Cardiac Enzymes: No results for input(s): CKTOTAL, CKMB, CKMBINDEX, TROPONINI in the last 168 hours. BNP (last 3 results) No results for input(s): PROBNP in the last 8760 hours. HbA1C: No results for input(s): HGBA1C in the last 72 hours. CBG: Recent Labs  Lab 04/03/18 1111 04/03/18 1612 04/03/18 2149 04/04/18 0803 04/04/18 1230  GLUCAP 259* 128* 106* 112* 200*   Lipid Profile: No results for input(s): CHOL, HDL, LDLCALC, TRIG, CHOLHDL, LDLDIRECT in the last 72 hours. Thyroid Function Tests: No results for input(s): TSH, T4TOTAL, FREET4, T3FREE, THYROIDAB in the last 72 hours. Anemia  Panel: No results for input(s): VITAMINB12, FOLATE, FERRITIN, TIBC, IRON, RETICCTPCT in the last 72 hours. Sepsis Labs: Recent Labs  Lab 04/01/18 1014 04/03/18 0054  LATICACIDVEN 1.30 1.12    Recent Results (from the past 240 hour(s))  Blood Culture (routine x 2)     Status: None (Preliminary result)   Collection Time: 04/01/18 10:23 AM  Result Value Ref Range Status   Specimen Description BLOOD RIGHT ANTECUBITAL  Final   Special Requests   Final    BOTTLES DRAWN AEROBIC AND ANAEROBIC Blood Culture adequate volume   Culture NO GROWTH 3 DAYS  Final   Report Status PENDING  Incomplete  Blood Culture (routine x 2)     Status: None (Preliminary result)   Collection Time: 04/01/18 10:23 AM  Result Value Ref Range Status   Specimen Description BLOOD BLOOD RIGHT FOREARM  Final   Special Requests   Final    AEROBIC BOTTLE ONLY Blood Culture results may not be optimal due to  an inadequate volume of blood received in culture bottles   Culture NO GROWTH 3 DAYS  Final   Report Status PENDING  Incomplete  Urine C&S     Status: None   Collection Time: 04/02/18 10:21 PM  Result Value Ref Range Status   Specimen Description URINE, RANDOM  Final   Special Requests NONE  Final   Culture   Final    NO GROWTH Performed at [facility], [address], [place], [place] [address]    Report Status 04/04/2018 FINAL  Final  Blood Culture (routine x 2)     Status: None (Preliminary result)   Collection Time: 04/03/18 12:32 AM  Result Value Ref Range Status   Specimen Description BLOOD RIGHT WRIST  Final   Special Requests   Final    BOTTLES DRAWN AEROBIC AND ANAEROBIC Blood Culture results may not be optimal due to an excessive volume of blood received in culture bottles Performed at [facility] [address], [place], [place] [address]    Culture NO GROWTH 1 DAY  Final   Report Status PENDING  Incomplete  Blood Culture (routine x 2)     Status: None (Preliminary result)    Collection Time: 04/03/18 12:43 AM  Result Value Ref Range Status   Specimen Description BLOOD LEFT WRIST  Final   Special Requests   Final    BOTTLES DRAWN AEROBIC AND ANAEROBIC Blood Culture results may not be optimal due to an excessive volume of blood received in culture bottles Performed at [facility] [address] [place], [place] [address]    Culture NO GROWTH 1 DAY  Final   Report Status PENDING  Incomplete  MRSA PCR Screening     Status: None   Collection Time: 04/03/18 11:33 AM  Result Value Ref Range Status   MRSA by PCR NEGATIVE NEGATIVE Final    Comment:        The GeneXpert MRSA Assay (FDA approved for NASAL specimens only), is one component of a comprehensive MRSA colonization surveillance program. It is not intended to diagnose MRSA infection nor to guide or monitor treatment for MRSA infections. Performed at [facility], [place] [address], [place], [place] [address]   Respiratory Panel by PCR     Status: None   Collection Time: 04/04/18  5:17 AM  Result Value Ref Range Status   Adenovirus NOT DETECTED NOT DETECTED Final   Coronavirus 229E NOT DETECTED NOT DETECTED Final   Coronavirus HKU1 NOT DETECTED NOT DETECTED Final   Coronavirus NL63 NOT DETECTED NOT DETECTED Final   Coronavirus OC43 NOT DETECTED NOT DETECTED Final   Metapneumovirus NOT DETECTED NOT DETECTED Final   Rhinovirus / Enterovirus NOT DETECTED NOT DETECTED Final   Influenza A NOT DETECTED NOT DETECTED Final   Influenza B NOT DETECTED NOT DETECTED Final   Parainfluenza Virus 1 NOT DETECTED NOT DETECTED Final   Parainfluenza Virus 2 NOT DETECTED NOT DETECTED Final   Parainfluenza Virus 3 NOT DETECTED NOT DETECTED Final   Parainfluenza Virus 4 NOT DETECTED NOT DETECTED Final   Respiratory Syncytial Virus NOT DETECTED NOT DETECTED Final   Bordetella pertussis NOT DETECTED NOT DETECTED Final   Chlamydophila pneumoniae NOT DETECTED NOT DETECTED Final   Mycoplasma pneumoniae NOT  DETECTED NOT DETECTED Final         Radiology Studies: Dg Chest Port 1 View  Result Date: 04/03/2018 CLINICAL DATA:  Fever. EXAM: PORTABLE CHEST 1 VIEW COMPARISON:  Radiograph 2 days prior 04/01/2018 FINDINGS: Unchanged heart size and mediastinal contours with aortic atherosclerosis. Similar interstitial prominence to prior exams, most conspicuous at the left lung base. No new airspace disease. No pleural effusion or pneumothorax. IMPRESSION: 1. Similar interstitial prominence to prior exams suggesting interstitial lung disease. 2. No new abnormalities. Electronically Signed   By: Keith Rake M.D.   On: 04/03/2018 01:02        Scheduled Meds: . amLODipine  10 mg Oral Daily  . atorvastatin  10 mg Oral QHS  . docusate sodium  100 mg Oral Q12H  . hydrALAZINE  25 mg Oral TID  . hydrALAZINE  75 mg Oral Q8H  . insulin aspart  3 Units Subcutaneous TID WC  . insulin glargine  12 Units Subcutaneous Daily  . multivitamin with minerals  1 tablet Oral Daily  . PARoxetine  20 mg Oral Daily  . QUEtiapine  25 mg Oral QHS  . sodium chloride flush  3 mL Intravenous Q12H   Continuous Infusions: . sodium chloride    . ceFEPime (MAXIPIME) IV Stopped (04/04/18 0240)  . metronidazole 500 mg (04/04/18 0839)  . vancomycin Stopped (04/03/18 2315)     LOS: 1 day    Time spent:35 mins. More than 50% of that time was spent in counseling and/or coordination of care.      Shelly Coss, MD [facility] Pager [phone]  If 7PM-7AM, please contact night-coverage [email or web address] Password [email or web address] 04/04/2018, 1:39 PM

## 2018-04-04 NOTE — Social Work (Signed)
CSW aware pt from [facility].   CSW continuing to follow for support with disposition when medically appropriate.  Await PT recommendations.  Westley Hummer, MSW, [place] Work [phone]

## 2018-04-05 LAB — CBC WITH DIFFERENTIAL/PLATELET
ABS IMMATURE GRANULOCYTES: 0.06 10*3/uL (ref 0.00–0.07)
Basophils Absolute: 0.1 10*3/uL (ref 0.0–0.1)
Basophils Relative: 1 %
Eosinophils Absolute: 0.3 10*3/uL (ref 0.0–0.5)
Eosinophils Relative: 2 %
HCT: 26.7 % — ABNORMAL LOW (ref 39.0–52.0)
HEMOGLOBIN: 8.3 g/dL — AB (ref 13.0–17.0)
Immature Granulocytes: 0 %
Lymphocytes Relative: 13 %
Lymphs Abs: 1.8 10*3/uL (ref 0.7–4.0)
MCH: 29.2 pg (ref 26.0–34.0)
MCHC: 31.1 g/dL (ref 30.0–36.0)
MCV: 94 fL (ref 80.0–100.0)
Monocytes Absolute: 1 10*3/uL (ref 0.1–1.0)
Monocytes Relative: 7 %
Neutro Abs: 10.7 10*3/uL — ABNORMAL HIGH (ref 1.7–7.7)
Neutrophils Relative %: 77 %
Platelets: 266 10*3/uL (ref 150–400)
RBC: 2.84 MIL/uL — ABNORMAL LOW (ref 4.22–5.81)
RDW: 13.3 % (ref 11.5–15.5)
WBC: 13.9 10*3/uL — ABNORMAL HIGH (ref 4.0–10.5)
nRBC: 0 % (ref 0.0–0.2)

## 2018-04-05 LAB — BASIC METABOLIC PANEL
Anion gap: 8 (ref 5–15)
BUN: 26 mg/dL — ABNORMAL HIGH (ref 8–23)
CO2: 23 mmol/L (ref 22–32)
Calcium: 8.8 mg/dL — ABNORMAL LOW (ref 8.9–10.3)
Chloride: 108 mmol/L (ref 98–111)
Creatinine, Ser: 1.85 mg/dL — ABNORMAL HIGH (ref 0.61–1.24)
GFR calc Af Amer: 42 mL/min — ABNORMAL LOW (ref >60)
GFR calc non Af Amer: 36 mL/min — ABNORMAL LOW (ref >60)
Glucose, Bld: 167 mg/dL — ABNORMAL HIGH (ref 70–99)
Potassium: 4.2 mmol/L (ref 3.5–5.1)
Sodium: 139 mmol/L (ref 135–145)

## 2018-04-05 LAB — GLUCOSE, CAPILLARY
GLUCOSE-CAPILLARY: 135 mg/dL — AB (ref 70–99)
Glucose-Capillary: 163 mg/dL — ABNORMAL HIGH (ref 70–99)
Glucose-Capillary: 190 mg/dL — ABNORMAL HIGH (ref 70–99)
Glucose-Capillary: 154 mg/dL — ABNORMAL HIGH (ref 70–99)
Glucose-Capillary: 139 mg/dL — ABNORMAL HIGH (ref 70–99)

## 2018-04-05 NOTE — Progress Notes (Signed)
Respiratory panel showed negative for all possible.  Droplet precaution discontinued.  Will continue to monitor.

## 2018-04-05 NOTE — Progress Notes (Signed)
PROGRESS NOTE    Manuel Randolph  ZJQ:[id number] DOB: [birth year] DOA: 04/02/2018 PCP: System, Pcp Not In   Brief Narrative: Patient is a 73 year old male with past medical history of dementia, hypertension, hyperlipidemia who was sent from skilled nursing facility with complaints of blood around the urinary catheter that was placed last week for urinary retention.  Patient is not able to provide any history due to his advanced dementia.  He was also found to be febrile with fever of 101 on arrival.  He was admitted for septic work-up.  Started on broad-spectrum antibiotics.  Assessment & Plan:   Principal Problem:   SIRS (systemic inflammatory response syndrome) (HCC) Active Problems:   Chronic lymphocytic leukemia (HCC)   Essential (primary) hypertension   DM type 2 with diabetic mixed hyperlipidemia (HCC)   Peripheral vascular disease (HCC)   Arteriovenous malformation of small intestine   Hyperkalemia   Dementia (HCC)   Acute lower UTI   Acute kidney injury superimposed on CKD ([place])  Suspected sepsis: Unknown source at this time.  Urine culture did not reveal any growth.  Empirically placed on empiric antibiotics. Currently he is hemodynamically stable.  Low grade Fever of 99.5 degrees this morning.  Blood cultures were sent.  MRSA PCR negative.  Has mild leukocytosis. Continue Vanco and cefepime for now.  Will discontinue Flagyl.  If culture remains negative till tomorrow ,we will discontinue antibiotics.  Hyperkalemia: Resolved  Suspected urinary tract infection: Urine culture did not reveal any growth.  Acute kidney injury superimposed on CKD: Baseline creatinine around 1.7.  Started him on gentle IV fluids.  History of chronic normocytic anemia: Baseline hemoglobin around 9-10.  We will continue to monitor CBC.  History of chronic lymphocytic leukemia: Currently stable  History of hypertension: Currently blood pressure stable.  Continue home meds  Diabetes type 2:  Continue sliding-scale insulin.  History of peripheral vascular disease: Currently stable  Dementia/debility: Looks like pretty advanced.  Continue supportive care.  Continue Seroquel.  Patient seen by physical therapy and recommended skilled nursing facility on discharge.  He is from memory care.  Social worker following.  History of recent urinary retention: Foley was placed about a week ago in the nursing facility.  There was noted to be blood around the Foley.  Urine culture was also suspected.  Foley has been changed here.  He needs to follow-up with urology as an outpatient for the removal of Foley.           DVT prophylaxis: SCD Code Status: Full code Family Communication: None present at the bedside Disposition Plan: Back to skilled nursing facility as soon as the bed is available.   Consultants: None  Procedures: None  Antimicrobials: Vancomycin, cefepime day 3  Subjective: Patient seen and examined the bedside this morning.  Has advanced dementia.  Unable to contribute to history.  Looks comfortable.    Objective: Vitals:   04/04/18 1300 04/04/18 2145 04/04/18 2157 04/05/18 0454  BP: (!) 146/58 (!) 147/47 (!) 156/55 136/66  Pulse: 70 75 73 71  Resp: 17 14  18   Temp: 98.7 F (37.1 C) 98.7 F (37.1 C)  99.5 F (37.5 C)  TempSrc: Oral Oral  Oral  SpO2: 99% 98%  97%  Weight:      Height:        Intake/Output Summary (Last 24 hours) at 04/05/2018 1136 Last data filed at 04/05/2018 0513 Gross per 24 hour  Intake 254.3 ml  Output 1000 ml  Net -745.7  ml   Filed Weights   04/02/18 2219 04/03/18 0900  Weight: 81.6 kg 80 kg    Examination:  General exam:Not in distress,average built HEENT:PERRL,Oral mucosa moist, Ear/Nose normal on gross exam Respiratory system: Bilateral equal air entry, normal vesicular breath sounds, no wheezes or crackles  Cardiovascular system: S1 & S2 heard, RRR.Murmur present.  No JVD, rubs, gallops or clicks. No pedal  edema. Gastrointestinal system: Abdomen is nondistended, soft and nontender. No organomegaly or masses felt. Normal bowel sounds heard. Central nervous system: Not alert or  oriented. No focal neurological deficits. Extremities: No edema, no clubbing ,no cyanosis, distal peripheral pulses palpable. Skin: No rashes, lesions or ulcers,no icterus ,no pallor    Data Reviewed: I have personally reviewed following labs and imaging studies  CBC: Recent Labs  Lab 04/01/18 1008 04/02/18 2237 04/04/18 0213 04/05/18 0355  WBC 9.8 13.3* 12.5* 13.9*  NEUTROABS 7.5  --   --  10.7*  HGB 9.6* 9.9* 8.7* 8.3*  HCT 32.1* 31.4* 27.8* 26.7*  MCV 94.4 94.6 93.0 94.0  PLT 191 240 233 440   Basic Metabolic Panel: Recent Labs  Lab 04/01/18 1008 04/02/18 2237 04/03/18 1136 04/04/18 0213 04/05/18 0355  NA 140 140 138 140 139  K 4.5 5.5* 4.7 4.2 4.2  CL 106 105 109 108 108  CO2 24 25 23 23 23   GLUCOSE 218* 170* 272* 128* 167*  BUN 26* 26* 26* 26* 26*  CREATININE 1.79* 2.16* 2.03* 1.92* 1.85*  CALCIUM 9.1 9.2 8.5* 8.8* 8.8*  MG 2.0  --   --   --   --    GFR: Estimated Creatinine Clearance: 35.4 mL/min (A) (by C-G formula based on SCr of 1.85 mg/dL (H)). Liver Function Tests: Recent Labs  Lab 04/01/18 1008 04/03/18 0032  AST 15 15  ALT 12 12  ALKPHOS 93 96  BILITOT 0.3 0.2*  PROT 7.1 6.8  ALBUMIN 3.3* 3.0*   No results for input(s): LIPASE, AMYLASE in the last 168 hours. No results for input(s): AMMONIA in the last 168 hours. Coagulation Profile: No results for input(s): INR, PROTIME in the last 168 hours. Cardiac Enzymes: No results for input(s): CKTOTAL, CKMB, CKMBINDEX, TROPONINI in the last 168 hours. BNP (last 3 results) No results for input(s): PROBNP in the last 8760 hours. HbA1C: No results for input(s): HGBA1C in the last 72 hours. CBG: Recent Labs  Lab 04/04/18 0803 04/04/18 1230 04/04/18 2143 04/04/18 2310 04/05/18 0801  GLUCAP 112* 200* 61* 185* 163*   Lipid  Profile: No results for input(s): CHOL, HDL, LDLCALC, TRIG, CHOLHDL, LDLDIRECT in the last 72 hours. Thyroid Function Tests: No results for input(s): TSH, T4TOTAL, FREET4, T3FREE, THYROIDAB in the last 72 hours. Anemia Panel: No results for input(s): VITAMINB12, FOLATE, FERRITIN, TIBC, IRON, RETICCTPCT in the last 72 hours. Sepsis Labs: Recent Labs  Lab 04/01/18 1014 04/03/18 0054  LATICACIDVEN 1.30 1.12    Recent Results (from the past 240 hour(s))  Blood Culture (routine x 2)     Status: None (Preliminary result)   Collection Time: 04/01/18 10:23 AM  Result Value Ref Range Status   Specimen Description BLOOD RIGHT ANTECUBITAL  Final   Special Requests   Final    BOTTLES DRAWN AEROBIC AND ANAEROBIC Blood Culture adequate volume   Culture NO GROWTH 4 DAYS  Final   Report Status PENDING  Incomplete  Blood Culture (routine x 2)     Status: None (Preliminary result)   Collection Time: 04/01/18 10:23 AM  Result  Value Ref Range Status   Specimen Description BLOOD BLOOD RIGHT FOREARM  Final   Special Requests   Final    AEROBIC BOTTLE ONLY Blood Culture results may not be optimal due to an inadequate volume of blood received in culture bottles   Culture NO GROWTH 4 DAYS  Final   Report Status PENDING  Incomplete  Urine C&S     Status: None   Collection Time: 04/02/18 10:21 PM  Result Value Ref Range Status   Specimen Description URINE, RANDOM  Final   Special Requests NONE  Final   Culture   Final    NO GROWTH Performed at [facility], [place] [address], [place], [place] [address]    Report Status 04/04/2018 FINAL  Final  Blood Culture (routine x 2)     Status: None (Preliminary result)   Collection Time: 04/03/18 12:32 AM  Result Value Ref Range Status   Specimen Description BLOOD RIGHT WRIST  Final   Special Requests   Final    BOTTLES DRAWN AEROBIC AND ANAEROBIC Blood Culture results may not be optimal due to an excessive volume of blood received in culture  bottles Performed at [facility] [address], [place], [place] [address]    Culture NO GROWTH 2 DAYS  Final   Report Status PENDING  Incomplete  Blood Culture (routine x 2)     Status: None (Preliminary result)   Collection Time: 04/03/18 12:43 AM  Result Value Ref Range Status   Specimen Description BLOOD LEFT WRIST  Final   Special Requests   Final    BOTTLES DRAWN AEROBIC AND ANAEROBIC Blood Culture results may not be optimal due to an excessive volume of blood received in culture bottles Performed at [facility] [address], [place], [place] [address]    Culture NO GROWTH 2 DAYS  Final   Report Status PENDING  Incomplete  MRSA PCR Screening     Status: None   Collection Time: 04/03/18 11:33 AM  Result Value Ref Range Status   MRSA by PCR NEGATIVE NEGATIVE Final    Comment:        The GeneXpert MRSA Assay (FDA approved for NASAL specimens only), is one component of a comprehensive MRSA colonization surveillance program. It is not intended to diagnose MRSA infection nor to guide or monitor treatment for MRSA infections. Performed at [facility], [place] [address] [place],  [address]   Respiratory Panel by PCR     Status: None   Collection Time: 04/04/18  5:17 AM  Result Value Ref Range Status   Adenovirus NOT DETECTED NOT DETECTED Final   Coronavirus 229E NOT DETECTED NOT DETECTED Final   Coronavirus HKU1 NOT DETECTED NOT DETECTED Final   Coronavirus NL63 NOT DETECTED NOT DETECTED Final   Coronavirus OC43 NOT DETECTED NOT DETECTED Final   Metapneumovirus NOT DETECTED NOT DETECTED Final   Rhinovirus / Enterovirus NOT DETECTED NOT DETECTED Final   Influenza A NOT DETECTED NOT DETECTED Final   Influenza B NOT DETECTED NOT DETECTED Final   Parainfluenza Virus 1 NOT DETECTED NOT DETECTED Final   Parainfluenza Virus 2 NOT DETECTED NOT DETECTED Final   Parainfluenza Virus 3 NOT DETECTED NOT DETECTED Final   Parainfluenza Virus 4 NOT DETECTED  NOT DETECTED Final   Respiratory Syncytial Virus NOT DETECTED NOT DETECTED Final   Bordetella pertussis NOT DETECTED NOT DETECTED Final   Chlamydophila pneumoniae NOT DETECTED NOT DETECTED Final   Mycoplasma  pneumoniae NOT DETECTED NOT DETECTED Final         Radiology Studies: No results found.      Scheduled Meds: . amLODipine  10 mg Oral Daily  . atorvastatin  10 mg Oral QHS  . docusate sodium  100 mg Oral Q12H  . hydrALAZINE  25 mg Oral TID  . hydrALAZINE  75 mg Oral Q8H  . insulin aspart  3 Units Subcutaneous TID WC  . insulin glargine  12 Units Subcutaneous Daily  . multivitamin with minerals  1 tablet Oral Daily  . PARoxetine  20 mg Oral Daily  . QUEtiapine  25 mg Oral QHS  . sodium chloride flush  3 mL Intravenous Q12H   Continuous Infusions: . sodium chloride    . sodium chloride 75 mL/hr at 04/05/18 0547  . ceFEPime (MAXIPIME) IV 1 g (04/05/18 0059)  . vancomycin 1,000 mg (04/04/18 2133)     LOS: 2 days    Time spent:35 mins. More than 50% of that time was spent in counseling and/or coordination of care.      Shelly Coss, MD [facility] Pager [phone]  If 7PM-7AM, please contact night-coverage [email or web address] Password [facility] 04/05/2018, 11:36 AM

## 2018-04-06 LAB — BASIC METABOLIC PANEL
Anion gap: 8 (ref 5–15)
BUN: 25 mg/dL — ABNORMAL HIGH (ref 8–23)
CHLORIDE: 109 mmol/L (ref 98–111)
CO2: 20 mmol/L — ABNORMAL LOW (ref 22–32)
Calcium: 8.7 mg/dL — ABNORMAL LOW (ref 8.9–10.3)
Creatinine, Ser: 1.72 mg/dL — ABNORMAL HIGH (ref 0.61–1.24)
GFR calc Af Amer: 45 mL/min — ABNORMAL LOW (ref >60)
GFR calc non Af Amer: 39 mL/min — ABNORMAL LOW (ref >60)
Glucose, Bld: 244 mg/dL — ABNORMAL HIGH (ref 70–99)
Potassium: 4.5 mmol/L (ref 3.5–5.1)
Sodium: 137 mmol/L (ref 135–145)

## 2018-04-06 LAB — CBC WITH DIFFERENTIAL/PLATELET
Abs Immature Granulocytes: 0.04 10*3/uL (ref 0.00–0.07)
BASOS ABS: 0 10*3/uL (ref 0.0–0.1)
Basophils Relative: 0 %
EOS PCT: 3 %
Eosinophils Absolute: 0.3 10*3/uL (ref 0.0–0.5)
HCT: 26.6 % — ABNORMAL LOW (ref 39.0–52.0)
Hemoglobin: 8.1 g/dL — ABNORMAL LOW (ref 13.0–17.0)
Immature Granulocytes: 0 %
LYMPHS PCT: 15 %
Lymphs Abs: 1.7 10*3/uL (ref 0.7–4.0)
MCH: 27.9 pg (ref 26.0–34.0)
MCHC: 30.5 g/dL (ref 30.0–36.0)
MCV: 91.7 fL (ref 80.0–100.0)
Monocytes Absolute: 0.8 10*3/uL (ref 0.1–1.0)
Monocytes Relative: 7 %
Neutro Abs: 8.1 10*3/uL — ABNORMAL HIGH (ref 1.7–7.7)
Neutrophils Relative %: 75 %
PLATELETS: 250 10*3/uL (ref 150–400)
RBC: 2.9 MIL/uL — ABNORMAL LOW (ref 4.22–5.81)
RDW: 13.1 % (ref 11.5–15.5)
WBC: 11 10*3/uL — ABNORMAL HIGH (ref 4.0–10.5)
nRBC: 0 % (ref 0.0–0.2)

## 2018-04-06 LAB — GLUCOSE, CAPILLARY
Glucose-Capillary: 185 mg/dL — ABNORMAL HIGH (ref 70–99)
Glucose-Capillary: 148 mg/dL — ABNORMAL HIGH (ref 70–99)
Glucose-Capillary: 81 mg/dL (ref 70–99)

## 2018-04-06 LAB — CULTURE, BLOOD (ROUTINE X 2)
CULTURE: NO GROWTH
Culture: NO GROWTH
Special Requests: ADEQUATE

## 2018-04-06 MED ORDER — HYDRALAZINE HCL 50 MG PO TABS
100.0000 mg | ORAL_TABLET | Freq: Three times a day (TID) | ORAL | Status: DC
  Administered 2018-04-06 (×2): 100 mg via ORAL
  Filled 2018-04-06 (×2): qty 2

## 2018-04-06 NOTE — Discharge Summary (Addendum)
Physician Discharge Summary  Manuel Randolph VOH:[id number] DOB: 04/25/1945 DOA: 04/02/2018  PCP: System, Pcp Not In  Admit date: 04/02/2018 Discharge date: 04/06/2018  Admitted From: Home Disposition:  Home  Discharge Condition:Stable CODE STATUS:FULL Diet recommendation: Heart Healthy   Brief/Interim Summary: Patient is a 73 year old male with past medical history of dementia, hypertension, hyperlipidemia who was sent from skilled nursing facility with complaints of blood around the urinary catheter that was placed last week for urinary retention.  Patient is not able to provide any history due to his advanced dementia.  He was also found to be febrile with fever of 101 on arrival.  He was suspected of sepsis and  admitted for septic work-up.  Started on broad-spectrum antibiotics.  His overall status is improved.  Currently he is hemodynamically stable.  His cultures have been negative.  Antibiotics has been discontinued.  Patient is stable for discharge back to  nursing facility today.  Following problems were addressed during his hospitalization:  Suspected sepsis: Unknown source at this time.  Urine culture did not reveal any growth. Blood cultures NGTD. Empirically placed on empiric antibiotics.Now discontinued. Currently he is hemodynamically stable.    Has mild leukocytosis..  Hyperkalemia: Resolved  Suspected urinary tract infection: Urine culture did not reveal any growth.  Acute kidney injury superimposed on CKD: Baseline creatinine around 1.7.  Started him on gentle IV fluids.Now on baseline.  Will discontinue IV fluid.  History of chronic normocytic anemia: Baseline hemoglobin around 9-10.  We will continue to monitor CBC.  History of chronic lymphocytic leukemia: Currently stable  History of hypertension: Currently blood pressure stable.  Continue home meds  Diabetes type 2: Continue previous insulin regimen.  History of peripheral vascular disease:  Currently stable  Dementia/debility: Looks like pretty advanced.  Continue supportive care.  Continue Seroquel.   He is from memory care.  Social worker was following.  History of recent urinary retention: Foley was placed about a week ago in the nursing facility.  There was noted to be blood around the Foley.  Urine culture was also suspected.  Foley has been changed here.  He needs to follow-up with urology as an outpatient in a week.     Discharge Diagnoses:  Principal Problem:   SIRS (systemic inflammatory response syndrome) (HCC) Active Problems:   Chronic lymphocytic leukemia (HCC)   Essential (primary) hypertension   DM type 2 with diabetic mixed hyperlipidemia (HCC)   Peripheral vascular disease (HCC)   Arteriovenous malformation of small intestine   Hyperkalemia   Dementia (HCC)   Acute lower UTI   Acute kidney injury superimposed on CKD [facility])    Discharge Instructions  Discharge Instructions    Diet - low sodium heart healthy   Complete by:  As directed    Discharge instructions   Complete by:  As directed    1) Please do a BMP test in a week. 2) Follow up with urology as an outpatient in a week.   Increase activity slowly   Complete by:  As directed      Allergies as of 04/06/2018   No Known Allergies     Medication List    STOP taking these medications   mupirocin ointment 2 % Commonly known as:  BACTROBAN     TAKE these medications   acetaminophen 500 MG tablet Commonly known as:  TYLENOL Take 500 mg by mouth every 4 (four) hours as needed for mild pain, fever or headache. NOT TO EXCEED 2000 MG  IN 24 HOURS   amLODipine 10 MG tablet Commonly known as:  NORVASC Take 1 tablet (10 mg total) by mouth daily.   aspirin EC 81 MG tablet Take 81 mg by mouth daily.   atorvastatin 10 MG tablet Commonly known as:  LIPITOR Take 10 mg by mouth at bedtime.   docusate sodium 100 MG capsule Commonly known as:  COLACE Take 1 capsule (100 mg total) by  mouth every 12 (twelve) hours.   gabapentin 300 MG capsule Commonly known as:  NEURONTIN TAKE 1 CAPSULE BY MOUTH 3  TIMES DAILY   glucose blood test strip Use as instructed   guaiFENesin 100 MG/5ML liquid Commonly known as:  ROBITUSSIN Take 100 mg by mouth 3 (three) times daily as needed for cough.   HUMALOG KWIKPEN 100 UNIT/ML KiwkPen Generic drug:  insulin lispro Inject 3 Units into the skin 3 (three) times daily. Hold if meal time BS is 100 or less. Call MD if BS greater than 450   hydrALAZINE 25 MG tablet Commonly known as:  APRESOLINE Take 25 mg by mouth 3 (three) times daily. Take 25 mg three times a day and 75 mg every 8 hours.   hydrALAZINE 50 MG tablet Commonly known as:  APRESOLINE Take 1.5 tablets (75 mg total) by mouth every 8 (eight) hours.   insulin glargine 100 UNIT/ML injection Commonly known as:  LANTUS Inject 0.12 mLs (12 Units total) into the skin daily.   Lancets 28G Misc 1 each by Does not apply route daily.   loperamide 2 MG capsule Commonly known as:  IMODIUM Take 2 mg by mouth as needed for diarrhea or loose stools. DO NOT EXCEED 8 DOSES IN 24 HOURS   magnesium hydroxide 400 MG/5ML suspension Commonly known as:  MILK OF MAGNESIA Take 30 mLs by mouth at bedtime as needed for mild constipation or moderate constipation.   Clayton 200-200-20 MG/5ML suspension Generic drug:  alum & mag hydroxide-simeth Take 30 mLs by mouth every 6 (six) hours as needed for indigestion or heartburn. NOT TO EXCEED 4 DOSES IN 24 HOURS   multivitamin with minerals Tabs tablet Take 1 tablet by mouth daily.   neomycin-bacitracin-polymyxin ointment Commonly known as:  NEOSPORIN Apply 1 application topically every 8 (eight) hours as needed for wound care.   PARoxetine 20 MG tablet Commonly known as:  PAXIL Take 20 mg by mouth daily.   polyethylene glycol packet Commonly known as:  MIRALAX / GLYCOLAX Take 17 g by mouth daily as needed for moderate constipation.    QUEtiapine 25 MG tablet Commonly known as:  SEROQUEL Take 1 tablet (25 mg total) by mouth at bedtime.   triamcinolone cream 0.1 % Commonly known as:  KENALOG APPLY TO AFFECTED AREA TWICE A DAY What changed:    how much to take  how to take this  when to take this      Follow-up Information    [facility]. Schedule an appointment as soon as possible for a visit in 1 week(s).   Contact information: Perla Beverly [phone]         No Known Allergies  Consultations:  None   Procedures/Studies: Dg Chest 2 View  Result Date: 04/01/2018 CLINICAL DATA:  73 year old male with history of tremors lasting 10-15 seconds. No loss of consciousness. EXAM: CHEST - 2 VIEW COMPARISON:  Chest x-ray 09/29/2017. FINDINGS: Low lung volumes. Interstitial prominence throughout the mid to lower lungs bilaterally, similar to several  prior examinations. No confluent consolidative airspace disease. No pleural effusions. No evidence of pulmonary edema. Heart size is normal. Upper mediastinal contours are within normal limits. Aortic atherosclerosis. IMPRESSION: 1. Low lung volumes with interstitial prominence throughout the mid to lower lungs bilaterally. Findings are concerning for underlying interstitial lung disease. Follow-up nonemergent high-resolution chest CT is suggested to better evaluate these findings. 2. Aortic atherosclerosis. Electronically Signed   By: Vinnie Langton M.D.   On: 04/01/2018 11:01   Dg Abdomen 1 View  Result Date: 04/01/2018 CLINICAL DATA:  Evaluate for pneumoperitoneum EXAM: ABDOMEN - 1 VIEW COMPARISON:  Chest x-ray from earlier today FINDINGS: No evident pneumoperitoneum. There is a large volume of stool, especially at the rectum where there is distention to 11 cm. No dilated small bowel. No concerning mass effect. Diffuse atherosclerotic calcification. IMPRESSION: 1. No evidence of free air. 2. Generalized stool  retention with rectal distention to 11 cm. Electronically Signed   By: Monte Fantasia M.D.   On: 04/01/2018 13:41   Ct Head Wo Contrast  Result Date: 04/01/2018 CLINICAL DATA:  73 year old with focal neuro deficit. Stroke suspected. New onset of tremors. EXAM: CT HEAD WITHOUT CONTRAST TECHNIQUE: Contiguous axial images were obtained from the base of the skull through the vertex without intravenous contrast. COMPARISON:  06/17/2017 FINDINGS: Brain: Stable cerebral atrophy. Stable areas of low-density in the left basal ganglia compatible with old lacune infarcts. Scattered areas of low density throughout the subcortical and periventricular white matter compatible with chronic ischemic changes. White matter disease has not significantly changed. Negative for acute hemorrhage, mass lesion, midline shift, hydrocephalus or new large infarct. Vascular: No hyperdense vessel or unexpected calcification. Skull: Normal. Negative for fracture or focal lesion. Sinuses/Orbits: Mucosal thickening in the ethmoid air cells. Mucosal disease in the bilateral maxillary sinuses. Other: None. IMPRESSION: 1. No acute intracranial abnormality. 2. Stable atrophy and evidence for chronic small vessel ischemic disease and remote lacune infarcts. Electronically Signed   By: Markus Daft M.D.   On: 04/01/2018 11:22   Dg Chest Port 1 View  Result Date: 04/03/2018 CLINICAL DATA:  Fever. EXAM: PORTABLE CHEST 1 VIEW COMPARISON:  Radiograph 2 days prior 04/01/2018 FINDINGS: Unchanged heart size and mediastinal contours with aortic atherosclerosis. Similar interstitial prominence to prior exams, most conspicuous at the left lung base. No new airspace disease. No pleural effusion or pneumothorax. IMPRESSION: 1. Similar interstitial prominence to prior exams suggesting interstitial lung disease. 2. No new abnormalities. Electronically Signed   By: Keith Rake M.D.   On: 04/03/2018 01:02   Ct Renal Stone Study  Result Date:  03/26/2018 CLINICAL DATA:  Abdominal distension and discomfort. History of dementia. History of chronic lymphocytic leukemia. EXAM: CT ABDOMEN AND PELVIS WITHOUT CONTRAST TECHNIQUE: Multidetector CT imaging of the abdomen and pelvis was performed following the standard protocol without IV contrast. COMPARISON:  CT abdomen and pelvis 04/10/2017. FINDINGS: Lower chest: Right middle lobe subpleural nodule on image 1 measures 0.8 cm, not notably changed. Nodular opacity in the lingula on image 1 measuring 1.7 cm is incompletely imaged. The patient has airspace disease in the lingula on the prior examination so it is difficult to determine the chronicity of this lesion. Bronchiectatic change and patchy airspace disease seen in the lower lobes are unchanged in appearance. No pleural or pericardial effusion. Heart size is normal. Hepatobiliary: Several small stones are seen layering dependently within the gallbladder. There is no evidence of cholecystitis. The liver and biliary tree appear normal. Pancreas: Unremarkable. No pancreatic ductal  dilatation or surrounding inflammatory changes. Spleen: Normal in size without focal abnormality. Adrenals/Urinary Tract: There is mild bilateral hydronephrosis which does not appear markedly changed. No urinary tract stones are identified. Marked distention of the urinary bladder is seen. The adrenal glands are unremarkable. Stomach/Bowel: A very large stool ball is present in the rectosigmoid colon and appears to compress the prostate. The colon is otherwise unremarkable. The stomach, small bowel and appendix appear normal. Vascular/Lymphatic: Extensive atherosclerotic vascular disease is identified. No lymphadenopathy. Reproductive: There is prostatomegaly. Penile implant is in place with the reservoir in the right lower quadrant adjacent to the bladder. Other: None. Musculoskeletal: T12 compression fracture is unchanged since the prior examination. No lytic or sclerotic lesion.  IMPRESSION: Marked distention of the urinary bladder and mild bilateral hydronephrosis most consistent with bladder outlet obstruction in this patient with prostatomegaly. Very large stool ball in the rectosigmoid colon appears to compress the prostate gland. 1.7 cm nodular opacity in left lung base is incompletely imaged. Recommend chest CT without IV contrast in 4-6 weeks for further evaluation. No change in a 0.8 cm right middle lobe subpleural nodule. Bronchiectatic change and patchy airspace disease in the lung bases are chronic and unchanged since the comparison CT. Gallstones without evidence of cholecystitis. Electronically Signed   By: Inge Rise M.D.   On: 03/26/2018 14:01      Subjective: Patient seen and examined at the bed side this morning. Remains comfortable. No acute issues. Mental status improved this morning.  Discharge Exam: Vitals:   04/05/18 2211 04/06/18 0448  BP: (!) 143/60 (!) 151/59  Pulse: 79 77  Resp: 18 (!) 22  Temp: 98.6 F (37 C) 99.4 F (37.4 C)  SpO2: 95% 97%   Vitals:   04/05/18 0454 04/05/18 1359 04/05/18 2211 04/06/18 0448  BP: 136/66 (!) 146/58 (!) 143/60 (!) 151/59  Pulse: 71 72 79 77  Resp: 18 16 18  (!) 22  Temp: 99.5 F (37.5 C) 98.5 F (36.9 C) 98.6 F (37 C) 99.4 F (37.4 C)  TempSrc: Oral Oral Oral Oral  SpO2: 97% 100% 95% 97%  Weight:      Height:        General: Pt is alert, awake, not in acute distress Cardiovascular: RRR, S1/S2 +, no rubs, no gallops Respiratory: CTA bilaterally, no wheezing, no rhonchi Abdominal: Soft, NT, ND, bowel sounds + Extremities: no edema, no cyanosis    The results of significant diagnostics from this hospitalization (including imaging, microbiology, ancillary and laboratory) are listed below for reference.     Microbiology: Recent Results (from the past 240 hour(s))  Blood Culture (routine x 2)     Status: None   Collection Time: 04/01/18 10:23 AM  Result Value Ref Range Status    Specimen Description BLOOD RIGHT ANTECUBITAL  Final   Special Requests   Final    BOTTLES DRAWN AEROBIC AND ANAEROBIC Blood Culture adequate volume   Culture NO GROWTH 5 DAYS  Final   Report Status 04/06/2018 FINAL  Final  Blood Culture (routine x 2)     Status: None   Collection Time: 04/01/18 10:23 AM  Result Value Ref Range Status   Specimen Description BLOOD BLOOD RIGHT FOREARM  Final   Special Requests   Final    AEROBIC BOTTLE ONLY Blood Culture results may not be optimal due to an inadequate volume of blood received in culture bottles   Culture NO GROWTH 5 DAYS  Final   Report Status 04/06/2018 FINAL  Final  Urine C&S     Status: None   Collection Time: 04/02/18 10:21 PM  Result Value Ref Range Status   Specimen Description URINE, RANDOM  Final   Special Requests NONE  Final   Culture   Final    NO GROWTH Performed at [facility], [address], [place], [place] [address]    Report Status 04/04/2018 FINAL  Final  Blood Culture (routine x 2)     Status: None (Preliminary result)   Collection Time: 04/03/18 12:32 AM  Result Value Ref Range Status   Specimen Description BLOOD RIGHT WRIST  Final   Special Requests   Final    BOTTLES DRAWN AEROBIC AND ANAEROBIC Blood Culture results may not be optimal due to an excessive volume of blood received in culture bottles Performed at [facility] [address] [place], [place] [address]    Culture NO GROWTH 3 DAYS  Final   Report Status PENDING  Incomplete  Blood Culture (routine x 2)     Status: None (Preliminary result)   Collection Time: 04/03/18 12:43 AM  Result Value Ref Range Status   Specimen Description BLOOD LEFT WRIST  Final   Special Requests   Final    BOTTLES DRAWN AEROBIC AND ANAEROBIC Blood Culture results may not be optimal due to an excessive volume of blood received in culture bottles Performed at [facility] [address] [place], [place] [address]    Culture NO GROWTH 3 DAYS  Final    Report Status PENDING  Incomplete  MRSA PCR Screening     Status: None   Collection Time: 04/03/18 11:33 AM  Result Value Ref Range Status   MRSA by PCR NEGATIVE NEGATIVE Final    Comment:        The GeneXpert MRSA Assay (FDA approved for NASAL specimens only), is one component of a comprehensive MRSA colonization surveillance program. It is not intended to diagnose MRSA infection nor to guide or monitor treatment for MRSA infections. Performed at [facility], [place] [address], [place], [place] [address]   Respiratory Panel by PCR     Status: None   Collection Time: 04/04/18  5:17 AM  Result Value Ref Range Status   Adenovirus NOT DETECTED NOT DETECTED Final   Coronavirus 229E NOT DETECTED NOT DETECTED Final   Coronavirus HKU1 NOT DETECTED NOT DETECTED Final   Coronavirus NL63 NOT DETECTED NOT DETECTED Final   Coronavirus OC43 NOT DETECTED NOT DETECTED Final   Metapneumovirus NOT DETECTED NOT DETECTED Final   Rhinovirus / Enterovirus NOT DETECTED NOT DETECTED Final   Influenza A NOT DETECTED NOT DETECTED Final   Influenza B NOT DETECTED NOT DETECTED Final   Parainfluenza Virus 1 NOT DETECTED NOT DETECTED Final   Parainfluenza Virus 2 NOT DETECTED NOT DETECTED Final   Parainfluenza Virus 3 NOT DETECTED NOT DETECTED Final   Parainfluenza Virus 4 NOT DETECTED NOT DETECTED Final   Respiratory Syncytial Virus NOT DETECTED NOT DETECTED Final   Bordetella pertussis NOT DETECTED NOT DETECTED Final   Chlamydophila pneumoniae NOT DETECTED NOT DETECTED Final   Mycoplasma pneumoniae NOT DETECTED NOT DETECTED Final     Labs: BNP (last 3 results) No results for input(s): BNP in the last 8760 hours. Basic Metabolic Panel: Recent Labs  Lab 04/01/18 1008 04/02/18 2237 04/03/18 1136 04/04/18 0213 04/05/18 0355 04/06/18 0254  NA 140 140 138 140 139 137  K 4.5 5.5* 4.7 4.2 4.2 4.5  CL 106  105 109 108 108 109  CO2 24 25 23 23 23  20*  GLUCOSE 218* 170* 272* 128* 167* 244*  BUN  26* 26* 26* 26* 26* 25*  CREATININE 1.79* 2.16* 2.03* 1.92* 1.85* 1.72*  CALCIUM 9.1 9.2 8.5* 8.8* 8.8* 8.7*  MG 2.0  --   --   --   --   --    Liver Function Tests: Recent Labs  Lab 04/01/18 1008 04/03/18 0032  AST 15 15  ALT 12 12  ALKPHOS 93 96  BILITOT 0.3 0.2*  PROT 7.1 6.8  ALBUMIN 3.3* 3.0*   No results for input(s): LIPASE, AMYLASE in the last 168 hours. No results for input(s): AMMONIA in the last 168 hours. CBC: Recent Labs  Lab 04/01/18 1008 04/02/18 2237 04/04/18 0213 04/05/18 0355 04/06/18 0254  WBC 9.8 13.3* 12.5* 13.9* 11.0*  NEUTROABS 7.5  --   --  10.7* 8.1*  HGB 9.6* 9.9* 8.7* 8.3* 8.1*  HCT 32.1* 31.4* 27.8* 26.7* 26.6*  MCV 94.4 94.6 93.0 94.0 91.7  PLT 191 240 233 266 250   Cardiac Enzymes: No results for input(s): CKTOTAL, CKMB, CKMBINDEX, TROPONINI in the last 168 hours. BNP: Invalid input(s): POCBNP CBG: Recent Labs  Lab 04/05/18 1649 04/05/18 2215 04/05/18 2308 04/06/18 0826 04/06/18 1240  GLUCAP 135* 154* 139* 185* 148*   D-Dimer No results for input(s): DDIMER in the last 72 hours. Hgb A1c No results for input(s): HGBA1C in the last 72 hours. Lipid Profile No results for input(s): CHOL, HDL, LDLCALC, TRIG, CHOLHDL, LDLDIRECT in the last 72 hours. Thyroid function studies No results for input(s): TSH, T4TOTAL, T3FREE, THYROIDAB in the last 72 hours.  Invalid input(s): FREET3 Anemia work up No results for input(s): VITAMINB12, FOLATE, FERRITIN, TIBC, IRON, RETICCTPCT in the last 72 hours. Urinalysis    Component Value Date/Time   COLORURINE YELLOW 04/02/2018 2326   APPEARANCEUR CLEAR 04/02/2018 2326   APPEARANCEUR Clear 02/18/2013 0735   LABSPEC 1.015 04/02/2018 2326   LABSPEC 1.008 02/18/2013 0735   PHURINE 6.5 04/02/2018 2326   GLUCOSEU NEGATIVE 04/02/2018 2326   GLUCOSEU Negative 02/18/2013 0735   HGBUR MODERATE (A) 04/02/2018 2326   BILIRUBINUR NEGATIVE 04/02/2018 2326   BILIRUBINUR Negative 02/18/2013 0735    KETONESUR NEGATIVE 04/02/2018 2326   PROTEINUR 100 (A) 04/02/2018 2326   NITRITE NEGATIVE 04/02/2018 2326   LEUKOCYTESUR TRACE (A) 04/02/2018 2326   LEUKOCYTESUR Negative 02/18/2013 0735   Sepsis Labs Invalid input(s): PROCALCITONIN,  WBC,  LACTICIDVEN Microbiology Recent Results (from the past 240 hour(s))  Blood Culture (routine x 2)     Status: None   Collection Time: 04/01/18 10:23 AM  Result Value Ref Range Status   Specimen Description BLOOD RIGHT ANTECUBITAL  Final   Special Requests   Final    BOTTLES DRAWN AEROBIC AND ANAEROBIC Blood Culture adequate volume   Culture NO GROWTH 5 DAYS  Final   Report Status 04/06/2018 FINAL  Final  Blood Culture (routine x 2)     Status: None   Collection Time: 04/01/18 10:23 AM  Result Value Ref Range Status   Specimen Description BLOOD BLOOD RIGHT FOREARM  Final   Special Requests   Final    AEROBIC BOTTLE ONLY Blood Culture results may not be optimal due to an inadequate volume of blood received in culture bottles   Culture NO GROWTH 5 DAYS  Final   Report Status 04/06/2018 FINAL  Final  Urine C&S     Status: None   Collection Time:  04/02/18 10:21 PM  Result Value Ref Range Status   Specimen Description URINE, RANDOM  Final   Special Requests NONE  Final   Culture   Final    NO GROWTH Performed at [facility], [address], [place], [place] [address]    Report Status 04/04/2018 FINAL  Final  Blood Culture (routine x 2)     Status: None (Preliminary result)   Collection Time: 04/03/18 12:32 AM  Result Value Ref Range Status   Specimen Description BLOOD RIGHT WRIST  Final   Special Requests   Final    BOTTLES DRAWN AEROBIC AND ANAEROBIC Blood Culture results may not be optimal due to an excessive volume of blood received in culture bottles Performed at [facility] [address], [place], [place] [address]    Culture NO GROWTH 3 DAYS  Final   Report Status PENDING  Incomplete  Blood Culture (routine x 2)      Status: None (Preliminary result)   Collection Time: 04/03/18 12:43 AM  Result Value Ref Range Status   Specimen Description BLOOD LEFT WRIST  Final   Special Requests   Final    BOTTLES DRAWN AEROBIC AND ANAEROBIC Blood Culture results may not be optimal due to an excessive volume of blood received in culture bottles Performed at [facility] [address] [place], [place] [address]    Culture NO GROWTH 3 DAYS  Final   Report Status PENDING  Incomplete  MRSA PCR Screening     Status: None   Collection Time: 04/03/18 11:33 AM  Result Value Ref Range Status   MRSA by PCR NEGATIVE NEGATIVE Final    Comment:        The GeneXpert MRSA Assay (FDA approved for NASAL specimens only), is one component of a comprehensive MRSA colonization surveillance program. It is not intended to diagnose MRSA infection nor to guide or monitor treatment for MRSA infections. Performed at [facility], [place] [address] [place], [place] [address]   Respiratory Panel by PCR     Status: None   Collection Time: 04/04/18  5:17 AM  Result Value Ref Range Status   Adenovirus NOT DETECTED NOT DETECTED Final   Coronavirus 229E NOT DETECTED NOT DETECTED Final   Coronavirus HKU1 NOT DETECTED NOT DETECTED Final   Coronavirus NL63 NOT DETECTED NOT DETECTED Final   Coronavirus OC43 NOT DETECTED NOT DETECTED Final   Metapneumovirus NOT DETECTED NOT DETECTED Final   Rhinovirus / Enterovirus NOT DETECTED NOT DETECTED Final   Influenza A NOT DETECTED NOT DETECTED Final   Influenza B NOT DETECTED NOT DETECTED Final   Parainfluenza Virus 1 NOT DETECTED NOT DETECTED Final   Parainfluenza Virus 2 NOT DETECTED NOT DETECTED Final   Parainfluenza Virus 3 NOT DETECTED NOT DETECTED Final   Parainfluenza Virus 4 NOT DETECTED NOT DETECTED Final   Respiratory Syncytial Virus NOT DETECTED NOT DETECTED Final   Bordetella pertussis NOT DETECTED NOT DETECTED Final   Chlamydophila pneumoniae NOT DETECTED NOT DETECTED  Final   Mycoplasma pneumoniae NOT DETECTED NOT DETECTED Final    Please note: You were cared for by a hospitalist during your hospital stay. Once you are discharged, your primary care physician will handle any further medical issues. Please note that NO REFILLS for any discharge medications will be authorized once you are discharged, as it is imperative that you return to your primary care physician (or establish a relationship with a primary care physician if  you do not have one) for your post hospital discharge needs so that they can reassess your need for medications and monitor your lab values.    Time coordinating discharge: 40 minutes  SIGNED:   Shelly Coss, MD  [facility] 04/06/2018, 4:04 PM Pager [phone]  If 7PM-7AM, please contact night-coverage [email or web address] Password [email or web address]

## 2018-04-06 NOTE — Progress Notes (Signed)
PROGRESS NOTE    Manuel Randolph  WUJ:[id number] DOB: 1946-02-04 DOA: 04/02/2018 PCP: System, Pcp Not In   Brief Narrative: Patient is a 73 year old male with past medical history of dementia, hypertension, hyperlipidemia who was sent from skilled nursing facility with complaints of blood around the urinary catheter that was placed last week for urinary retention.  Patient is not able to provide any history due to his advanced dementia.  He was also found to be febrile with fever of 101 on arrival.  He was suspected of sepsis and  admitted for septic work-up.  Started on broad-spectrum antibiotics.  His overall status is improved.  Currently he is hemodynamically stable.  His cultures have been negative.  Antibiotics has been discontinued.  Patient is stable for discharge to nursing facility as soon as the bed is available.  Assessment & Plan:   Principal Problem:   SIRS (systemic inflammatory response syndrome) (HCC) Active Problems:   Chronic lymphocytic leukemia (HCC)   Essential (primary) hypertension   DM type 2 with diabetic mixed hyperlipidemia (HCC)   Peripheral vascular disease (HCC)   Arteriovenous malformation of small intestine   Hyperkalemia   Dementia (HCC)   Acute lower UTI   Acute kidney injury superimposed on CKD ([place])  Suspected sepsis: Unknown source at this time.  Urine culture did not reveal any growth. Blood cultures NGTD. Empirically placed on empiric antibiotics.Now discontinued. Currently he is hemodynamically stable.    Has mild leukocytosis..  Hyperkalemia: Resolved  Suspected urinary tract infection: Urine culture did not reveal any growth.  Acute kidney injury superimposed on CKD: Baseline creatinine around 1.7.  Started him on gentle IV fluids.Now on baseline.  Will discontinue IV fluid.  History of chronic normocytic anemia: Baseline hemoglobin around 9-10.  We will continue to monitor CBC.  History of chronic lymphocytic leukemia: Currently  stable  History of hypertension: Currently blood pressure stable.  Continue home meds  Diabetes type 2: Continue sliding-scale insulin.  History of peripheral vascular disease: Currently stable  Dementia/debility: Looks like pretty advanced.  Continue supportive care.  Continue Seroquel.  Patient seen by physical therapy and recommended skilled nursing facility on discharge.  He is from memory care.  Social worker following.  History of recent urinary retention: Foley was placed about a week ago in the nursing facility.  There was noted to be blood around the Foley.  Urine culture was also suspected.  Foley has been changed here.  He needs to follow-up with urology as an outpatient for the removal of Foley.           DVT prophylaxis: SCD Code Status: Full code Family Communication: None present at the bedside Disposition Plan: Back to skilled nursing facility as soon as the bed is available.   Consultants: None  Procedures: None  Antimicrobials: None Subjective: Patient seen and examined the bedside this morning.  His mental status has significantly improved today.  He communicates now.  He is alert but not oriented.  Looks comfortable.  Objective: Vitals:   04/05/18 0454 04/05/18 1359 04/05/18 2211 04/06/18 0448  BP: 136/66 (!) 146/58 (!) 143/60 (!) 151/59  Pulse: 71 72 79 77  Resp: 18 16 18  (!) 22  Temp: 99.5 F (37.5 C) 98.5 F (36.9 C) 98.6 F (37 C) 99.4 F (37.4 C)  TempSrc: Oral Oral Oral Oral  SpO2: 97% 100% 95% 97%  Weight:      Height:        Intake/Output Summary (Last 24 hours)  at 04/06/2018 1249 Last data filed at 04/06/2018 0700 Gross per 24 hour  Intake 2935.69 ml  Output 1300 ml  Net 1635.69 ml   Filed Weights   04/02/18 2219 04/03/18 0900  Weight: 81.6 kg 80 kg    Examination:  General exam:Not in distress,average built HEENT:PERRL,Oral mucosa moist, Ear/Nose normal on gross exam Respiratory system: Bilateral equal air entry, normal  vesicular breath sounds, no wheezes or crackles  Cardiovascular system: S1 & S2 heard, RRR.Murmur present.  No JVD, rubs, gallops or clicks. No pedal edema. Gastrointestinal system: Abdomen is nondistended, soft and nontender. No organomegaly or masses felt. Normal bowel sounds heard. Central nervous system: Alert but not   oriented. No focal neurological deficits. Extremities: No edema, no clubbing ,no cyanosis, distal peripheral pulses palpable. Skin: No rashes, lesions or ulcers,no icterus ,no pallor    Data Reviewed: I have personally reviewed following labs and imaging studies  CBC: Recent Labs  Lab 04/01/18 1008 04/02/18 2237 04/04/18 0213 04/05/18 0355 04/06/18 0254  WBC 9.8 13.3* 12.5* 13.9* 11.0*  NEUTROABS 7.5  --   --  10.7* 8.1*  HGB 9.6* 9.9* 8.7* 8.3* 8.1*  HCT 32.1* 31.4* 27.8* 26.7* 26.6*  MCV 94.4 94.6 93.0 94.0 91.7  PLT 191 240 233 266 782   Basic Metabolic Panel: Recent Labs  Lab 04/01/18 1008 04/02/18 2237 04/03/18 1136 04/04/18 0213 04/05/18 0355 04/06/18 0254  NA 140 140 138 140 139 137  K 4.5 5.5* 4.7 4.2 4.2 4.5  CL 106 105 109 108 108 109  CO2 24 25 23 23 23  20*  GLUCOSE 218* 170* 272* 128* 167* 244*  BUN 26* 26* 26* 26* 26* 25*  CREATININE 1.79* 2.16* 2.03* 1.92* 1.85* 1.72*  CALCIUM 9.1 9.2 8.5* 8.8* 8.8* 8.7*  MG 2.0  --   --   --   --   --    GFR: Estimated Creatinine Clearance: 38.1 mL/min (A) (by C-G formula based on SCr of 1.72 mg/dL (H)). Liver Function Tests: Recent Labs  Lab 04/01/18 1008 04/03/18 0032  AST 15 15  ALT 12 12  ALKPHOS 93 96  BILITOT 0.3 0.2*  PROT 7.1 6.8  ALBUMIN 3.3* 3.0*   No results for input(s): LIPASE, AMYLASE in the last 168 hours. No results for input(s): AMMONIA in the last 168 hours. Coagulation Profile: No results for input(s): INR, PROTIME in the last 168 hours. Cardiac Enzymes: No results for input(s): CKTOTAL, CKMB, CKMBINDEX, TROPONINI in the last 168 hours. BNP (last 3 results) No  results for input(s): PROBNP in the last 8760 hours. HbA1C: No results for input(s): HGBA1C in the last 72 hours. CBG: Recent Labs  Lab 04/05/18 1649 04/05/18 2215 04/05/18 2308 04/06/18 0826 04/06/18 1240  GLUCAP 135* 154* 139* 185* 148*   Lipid Profile: No results for input(s): CHOL, HDL, LDLCALC, TRIG, CHOLHDL, LDLDIRECT in the last 72 hours. Thyroid Function Tests: No results for input(s): TSH, T4TOTAL, FREET4, T3FREE, THYROIDAB in the last 72 hours. Anemia Panel: No results for input(s): VITAMINB12, FOLATE, FERRITIN, TIBC, IRON, RETICCTPCT in the last 72 hours. Sepsis Labs: Recent Labs  Lab 04/01/18 1014 04/03/18 0054  LATICACIDVEN 1.30 1.12    Recent Results (from the past 240 hour(s))  Blood Culture (routine x 2)     Status: None (Preliminary result)   Collection Time: 04/01/18 10:23 AM  Result Value Ref Range Status   Specimen Description BLOOD RIGHT ANTECUBITAL  Final   Special Requests   Final    BOTTLES DRAWN  AEROBIC AND ANAEROBIC Blood Culture adequate volume   Culture NO GROWTH 4 DAYS  Final   Report Status PENDING  Incomplete  Blood Culture (routine x 2)     Status: None (Preliminary result)   Collection Time: 04/01/18 10:23 AM  Result Value Ref Range Status   Specimen Description BLOOD BLOOD RIGHT FOREARM  Final   Special Requests   Final    AEROBIC BOTTLE ONLY Blood Culture results may not be optimal due to an inadequate volume of blood received in culture bottles   Culture NO GROWTH 4 DAYS  Final   Report Status PENDING  Incomplete  Urine C&S     Status: None   Collection Time: 04/02/18 10:21 PM  Result Value Ref Range Status   Specimen Description URINE, RANDOM  Final   Special Requests NONE  Final   Culture   Final    NO GROWTH Performed at [facility], [place] [address] [place], [place] [address]    Report Status 04/04/2018 FINAL  Final  Blood Culture (routine x 2)     Status: None (Preliminary result)   Collection Time: 04/03/18 12:32  AM  Result Value Ref Range Status   Specimen Description BLOOD RIGHT WRIST  Final   Special Requests   Final    BOTTLES DRAWN AEROBIC AND ANAEROBIC Blood Culture results may not be optimal due to an excessive volume of blood received in culture bottles Performed at [facility] [address], [place], [place] [address]    Culture NO GROWTH 2 DAYS  Final   Report Status PENDING  Incomplete  Blood Culture (routine x 2)     Status: None (Preliminary result)   Collection Time: 04/03/18 12:43 AM  Result Value Ref Range Status   Specimen Description BLOOD LEFT WRIST  Final   Special Requests   Final    BOTTLES DRAWN AEROBIC AND ANAEROBIC Blood Culture results may not be optimal due to an excessive volume of blood received in culture bottles Performed at [place] [address] [place], [place] [address]    Culture NO GROWTH 2 DAYS  Final   Report Status PENDING  Incomplete  MRSA PCR Screening     Status: None   Collection Time: 04/03/18 11:33 AM  Result Value Ref Range Status   MRSA by PCR NEGATIVE NEGATIVE Final    Comment:        The GeneXpert MRSA Assay (FDA approved for NASAL specimens only), is one component of a comprehensive MRSA colonization surveillance program. It is not intended to diagnose MRSA infection nor to guide or monitor treatment for MRSA infections. Performed at [facility], [place] [address] [place],  [address]   Respiratory Panel by PCR     Status: None   Collection Time: 04/04/18  5:17 AM  Result Value Ref Range Status   Adenovirus NOT DETECTED NOT DETECTED Final   Coronavirus 229E NOT DETECTED NOT DETECTED Final   Coronavirus HKU1 NOT DETECTED NOT DETECTED Final   Coronavirus NL63 NOT DETECTED NOT DETECTED Final   Coronavirus OC43 NOT DETECTED NOT DETECTED Final   Metapneumovirus NOT DETECTED NOT DETECTED Final   Rhinovirus / Enterovirus NOT DETECTED NOT DETECTED Final   Influenza A NOT DETECTED NOT DETECTED Final    Influenza B NOT DETECTED NOT DETECTED Final   Parainfluenza Virus 1 NOT DETECTED NOT DETECTED Final   Parainfluenza Virus 2 NOT DETECTED NOT DETECTED Final   Parainfluenza Virus 3  NOT DETECTED NOT DETECTED Final   Parainfluenza Virus 4 NOT DETECTED NOT DETECTED Final   Respiratory Syncytial Virus NOT DETECTED NOT DETECTED Final   Bordetella pertussis NOT DETECTED NOT DETECTED Final   Chlamydophila pneumoniae NOT DETECTED NOT DETECTED Final   Mycoplasma pneumoniae NOT DETECTED NOT DETECTED Final         Radiology Studies: No results found.      Scheduled Meds: . amLODipine  10 mg Oral Daily  . atorvastatin  10 mg Oral QHS  . docusate sodium  100 mg Oral Q12H  . hydrALAZINE  25 mg Oral TID  . hydrALAZINE  75 mg Oral Q8H  . insulin aspart  3 Units Subcutaneous TID WC  . insulin glargine  12 Units Subcutaneous Daily  . multivitamin with minerals  1 tablet Oral Daily  . PARoxetine  20 mg Oral Daily  . QUEtiapine  25 mg Oral QHS  . sodium chloride flush  3 mL Intravenous Q12H   Continuous Infusions: . sodium chloride    . sodium chloride 75 mL/hr at 04/06/18 0847     LOS: 3 days    Time spent:25 mins. More than 50% of that time was spent in counseling and/or coordination of care.      Shelly Coss, MD [facility] Pager [phone]  If 7PM-7AM, please contact night-coverage [email or web address] Password [facility] 04/06/2018, 12:49 PM

## 2018-04-06 NOTE — Plan of Care (Signed)
  Problem: Clinical Measurements: Goal: Diagnostic test results will improve Outcome: Progressing Goal: Respiratory complications will improve Outcome: Progressing Goal: Cardiovascular complication will be avoided Outcome: Progressing   

## 2018-04-06 NOTE — Clinical Social Work Placement (Signed)
   CLINICAL SOCIAL WORK PLACEMENT  NOTE Stephan Minister RN to call report to [phone]  Date:  04/06/2018  Patient Details  Name: Manuel Randolph Sr. MRN: [id number] Date of Birth: 1945-07-22  Clinical Social Work is seeking post-discharge placement for this patient at the [place] level of care (*CSW will initial, date and re-position this form in  chart as items are completed):  No   Patient/family provided with [place] Work Department's list of facilities offering this level of care within the geographic area requested by the patient (or if unable, by the patient's family).  Yes   Patient/family informed of their freedom to choose among providers that offer the needed level of care, that participate in Medicare, Medicaid or managed care program needed by the patient, have an available bed and are willing to accept the patient.  Yes   Patient/family informed of [facility]'s ownership interest in [facility] and [facility], as well as of the fact that they are under no obligation to receive care at these facilities.  PASRR submitted to EDS on       PASRR number received on       Existing PASRR number confirmed on       FL2 transmitted to all facilities in geographic area requested by pt/family on 04/06/18     FL2 transmitted to all facilities within larger geographic area on       Patient informed that his/her managed care company has contracts with or will negotiate with certain facilities, including the following:        Yes   Patient/family informed of bed offers received.  Patient chooses bed at [facility]     Physician recommends and patient chooses bed at      Patient to be transferred to [facility] on 04/06/18.  Patient to be transferred to facility by [facility]     Patient family notified on 04/06/18 of transfer.  Name of family member notified:  [name]     PHYSICIAN       Additional Comment:      _______________________________________________ [place], [place] 04/06/2018, 4:36 PM

## 2018-04-06 NOTE — Consult Note (Signed)
   [facility] CM Inpatient Consult   04/06/2018  TELLY JAWAD Sr. 08/24/1945 [id number]   Patient chart has been reviewed for high risk score, 35%, for unplanned readmissions. Chart review reveals current disposition plan is for memory care facility. No [facility] Care Management needs.    Netta Cedars, MSN, [facility] Liaison Nurse Mobile Phone [phone]  Toll free office [phone]

## 2018-04-06 NOTE — Social Work (Signed)
Facility returned call with questions regarding Bactroban and insulin. Per MD Bactroban has been discontinued and summary updated. He states that on d/c pt should take home medications as previously administered. Will fax updated FL2 and summary.   Clinical Social Worker facilitated patient discharge including contacting patient family and facility to confirm patient discharge plans.  Clinical information faxed to facility and family agreeable with plan.  CSW arranged ambulance transport via [place] to [facility] RN to call [phone] with report prior to discharge.  Clinical Social Worker will sign off for now as social work intervention is no longer needed. Please consult [place] again if new need arises.  Westley Hummer, MSW, [place] Social Worker [phone]

## 2018-04-06 NOTE — Care Management Important Message (Signed)
Important Message  Patient Details  Name: Manuel GIBBON Sr. MRN: [id number] Date of Birth: 02/09/1946   Medicare Important Message Given:  Yes  Due to illness patient did not sign.  Unsigned copy left  Amanie Mcculley 04/06/2018, 3:44 PM

## 2018-04-06 NOTE — Clinical Social Work Note (Signed)
Clinical Social Work Assessment  Patient Details  Name: Manuel HEMP Sr. MRN: [id number] Date of Birth: [birth year]  Date of referral:  04/06/18               Reason for consult:  Facility Placement, Discharge Planning                Permission sought to share information with:  Facility Sport and exercise psychologist, Family Supports Permission granted to share information::  No(pt disoriented due to dementia)  Name::     Manuel Randolph  Agency::  Stephan Minister  Relationship::  daughter  Contact Information:  [phone]  Housing/Transportation Living arrangements for the past 2 months:  [place] of Information:  Adult Children Patient Interpreter Needed:  None Criminal Activity/Legal Involvement Pertinent to Current Situation/Hospitalization:  No - Comment as needed Significant Relationships:  Adult Children, Community Support Lives with:  Facility Resident Do you feel safe going back to the place where you live?  Yes Need for family participation in patient care:  Yes (Comment)  Care giving concerns:  Pt is LTC at [facility]. He has been living there with assistance from his children as needed. Pt daughter worried about multiple transitions to SNF and then back to [facility]. Pt medically stable for d/c per MD.   Facilities manager / plan:  CSW spoke with pt daughter Manuel Randolph on the phone because of pt mental status. Introduced self, role, and reason for call. Pt daughter provided with recommendations from PT. We discussed that sometimes pts discharge to SNF for therapy and then return to ALF or that we could arrange home health at ALF. Pt daughter states that she prefers for pt to return to [facility] with therapies, it is a familiar environment and he is able to receive therapy there. Pt daughter also wants pt to keep his bed.   CSW will f/u with Stephan Minister regarding pt return and update daughter. Pt daughter aware pt  likely discharge today or tomorrow morning pending medical stability.  Employment status:  Retired Forensic scientist:  Medicare PT Recommendations:  [place] / Referral to community resources:  [place]  Patient/Family's Response to care:  Pt daughter amenable to return to [facility] with therapy, she was amenable to speaking with CSW.  Patient/Family's Understanding of and Emotional Response to Diagnosis, Current Treatment, and Prognosis:  Pt daughter states understanding of diagnosis, current treatment and prognosis. Her preference is for pt to return to ALF/memory care with therapies. Pt daughter emotionally appropriate throughout call and amenable to plan.  Emotional Assessment Appearance:  Appears stated age Attitude/Demeanor/Rapport:  Unable to Assess Affect (typically observed):  Unable to Assess Orientation:  Oriented to Self, Fluctuating Orientation (Suspected and/or reported Sundowners) Alcohol / Substance use:  Not Applicable Psych involvement (Current and /or in the community):  No (Comment)  Discharge Needs  Concerns to be addressed:  Care Coordination, Discharge Planning Concerns Readmission within the last 30 days:  No Current discharge risk:  Cognitively Impaired, Dependent with Mobility Barriers to Discharge:  Continued Medical Work up   [employer], [place] 04/06/2018, 2:18 PM

## 2018-04-06 NOTE — Social Work (Addendum)
12:54pm- CSW attempted to reach pt daughter via telephone. Per facility [facility]) pt was utilizing a wheelchair at baseline prior to admission. HIPPA compliant message left for [name], will attempt again.  8:42am- CSW acknowledging consult for SNF placement. Will follow up with pt family regarding SNF vs. Return to Memory Care.  Westley Hummer, MSW, [place] Work [phone]

## 2018-04-06 NOTE — Care Management Important Message (Signed)
Important Message  Patient Details  Name: Manuel CHATMON Sr. MRN: [id number] Date of Birth: 05/26/1945   Medicare Important Message Given:  Yes    Dionisio Aragones 04/06/2018, 4:20 PM

## 2018-04-06 NOTE — NC FL2 (Addendum)
[place] LEVEL OF CARE SCREENING TOOL     IDENTIFICATION  Patient Name: Manuel Randolph Sr. Birthdate: 07/27/1945 Sex: male Admission Date (Current Location): 04/02/2018  [facility] and [place] Number:  Herbalist and Address:  [facility], [place] [address], [place], [place] [address]      Provider Number: [id number]  Attending Physician Name and Address:  Shelly Coss, MD  Relative Name and Phone Number:  Emigdio Wildeman; daughter; [phone]    Current Level of Care: Hospital Recommended Level of Care: Memory Care Prior Approval Number:    Date Approved/Denied:   PASRR Number:    Discharge Plan: Other (Comment)([facility])    Current Diagnoses: Patient Active Problem List   Diagnosis Date Noted  . SIRS (systemic inflammatory response syndrome) ([place]) 04/03/2018  . Acute lower UTI 04/03/2018  . Acute kidney injury superimposed on CKD ([place]) 04/03/2018  . Acute pancreatitis without infection or necrosis   . Hypothermia 04/10/2017  . AKI (acute kidney injury) ([place]) 04/10/2017  . Hyperkalemia 04/10/2017  . Acute metabolic encephalopathy 53/64/6803  . Dementia ([place]) 04/10/2017  . Sepsis ([place]) 04/10/2017  . HCAP (healthcare-associated pneumonia) 04/10/2017  . Rhabdomyolysis 11/14/2016  . Multiple lacunar infarcts 11/06/2016  . Small vessel disease, cerebrovascular 11/06/2016  . Syncope 09/13/2016  . Dermatitis 03/01/2016  . Anemia, pernicious 07/04/2015  . Hip pain 07/04/2015  . Aortic valve sclerosis 02/19/2015  . Chronic lymphocytic leukemia ([place]) 02/19/2015  . DM type 2 with diabetic peripheral neuropathy ([place]) 02/19/2015  . Essential (primary) hypertension 02/19/2015  . DM type 2 with diabetic mixed hyperlipidemia ([place]) 02/19/2015  . Diabetic peripheral neuropathy associated with type 2 diabetes mellitus ([place]) 02/19/2015  . Bleeding ulcer 02/19/2015  . Peripheral vascular disease ([place]) 02/19/2015   . 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 Encounter for screening for malignant neoplasm of prostate 02/19/2015  . Type 2 diabetes mellitus with stage 3 chronic kidney disease ([place]) 02/19/2015  . Compulsive tobacco user syndrome 02/19/2015  . 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  . Aortic valve defect 12/22/2012  . 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[place]) 11/13/2011    Orientation RESPIRATION BLADDER Height & Weight     Self  Normal Incontinent, Indwelling catheter(foley for retention) Weight: 176 lb 5.9 oz (80 kg) Height:  5\' 8"  (172.7 cm)  BEHAVIORAL SYMPTOMS/MOOD NEUROLOGICAL BOWEL NUTRITION STATUS      Continent Diet (chopped meat, low sodium; thin liquids)  AMBULATORY STATUS COMMUNICATION OF NEEDS Skin   Extensive Assist Verbally Normal                       Personal Care Assistance Level of Assistance  Bathing, Feeding, Dressing Bathing Assistance: Maximum assistance Feeding assistance: Limited assistance Dressing Assistance: Maximum assistance     Functional Limitations Info  Sight, Hearing, Speech Sight Info: Adequate Hearing Info: Adequate Speech Info: Adequate    SPECIAL CARE FACTORS FREQUENCY  PT (By licensed PT)     PT Frequency: 2x week              Contractures Contractures Info: Not present    Additional Factors Info  Code Status, Allergies, Psychotropic, Insulin Sliding Scale Code Status Info: Full Code Allergies Info: No Known Allergies  Psychotropic Info: PARoxetine (PAXIL) tablet 20 mg daily PO; QUEtiapine (SEROQUEL) tablet 25 mg daily at bedtime PO Insulin Sliding Scale Info: insulin aspart (novoLOG) injection 3 Units 3x daily with meals; insulin  glargine (LANTUS) injection 12 Units daily       Current Medications (04/06/2018):  This is the current hospital active medication list Current Facility-Administered Medications  Medication Dose Route Frequency Provider Last Rate Last Dose  . 0.9  %  sodium chloride infusion  250 mL Intravenous PRN Phillips Grout, MD      . acetaminophen (TYLENOL) tablet 500 mg  500 mg Oral Q4H PRN Phillips Grout, MD      . alum & mag hydroxide-simeth (MAALOX/MYLANTA) 200-200-20 MG/5ML suspension 30 mL  30 mL Oral Q6H PRN Phillips Grout, MD      . amLODipine (NORVASC) tablet 10 mg  10 mg Oral Daily Derrill Kay A, MD   10 mg at 04/06/18 1029  . atorvastatin (LIPITOR) tablet 10 mg  10 mg Oral QHS Derrill Kay A, MD   10 mg at 04/05/18 2258  . docusate sodium (COLACE) capsule 100 mg  100 mg Oral Q12H Derrill Kay A, MD   100 mg at 04/06/18 0842  . guaiFENesin (ROBITUSSIN) 100 MG/5ML solution 100 mg  100 mg Oral TID PRN Phillips Grout, MD      . hydrALAZINE (APRESOLINE) tablet 25 mg  25 mg Oral TID Derrill Kay A, MD   25 mg at 04/06/18 1029  . hydrALAZINE (APRESOLINE) tablet 75 mg  75 mg Oral Q8H Phillips Grout, MD   75 mg at 04/06/18 0272  . insulin aspart (novoLOG) injection 3 Units  3 Units Subcutaneous TID WC Phillips Grout, MD   3 Units at 04/06/18 1247  . insulin glargine (LANTUS) injection 12 Units  12 Units Subcutaneous Daily Phillips Grout, MD   12 Units at 04/06/18 1029  . loperamide (IMODIUM) capsule 2 mg  2 mg Oral PRN Derrill Kay A, MD      . magnesium hydroxide (MILK OF MAGNESIA) suspension 30 mL  30 mL Oral QHS PRN Phillips Grout, MD      . multivitamin with minerals tablet 1 tablet  1 tablet Oral Daily Phillips Grout, MD   1 tablet at 04/06/18 1028  . ondansetron (ZOFRAN) tablet 4 mg  4 mg Oral Q6H PRN Phillips Grout, MD       Or  . ondansetron [facility]) injection 4 mg  4 mg Intravenous Q6H PRN Phillips Grout, MD      . PARoxetine (PAXIL) tablet 20 mg  20 mg Oral Daily Derrill Kay A, MD   20 mg at 04/06/18 1028  . polyethylene glycol (MIRALAX / GLYCOLAX) packet 17 g  17 g Oral Daily PRN Derrill Kay A, MD      . QUEtiapine (SEROQUEL) tablet 25 mg  25 mg Oral QHS Derrill Kay A, MD   25 mg at 04/05/18 2258  . sodium  chloride flush (NS) 0.9 % injection 3 mL  3 mL Intravenous Q12H Derrill Kay A, MD   3 mL at 04/06/18 1031  . sodium chloride flush (NS) 0.9 % injection 3 mL  3 mL Intravenous PRN Phillips Grout, MD         Discharge Medications: Please see discharge summary for a list of discharge medications.  Relevant Imaging Results:  Relevant Lab Results:   Additional Information SS#[phone]; home health PT recommended  Alexander Mt, LCSWA

## 2018-04-06 NOTE — Progress Notes (Signed)
Pharmacy Antibiotic Note  Manuel Randolph. is a 73 y.o. male admitted on 04/02/2018 with FUO.  Pharmacy has been consulted for vancomycin/cefepime dosing.  He is also on Flagyl.  SCr improving, CrCL 38 ml/min, afebrile, WBC 11.  Plan: Continue cefepime 1g IV Q24H Vanc 1g IV Q24H for AUC 514 using SCr 1.85 Flagyl 500mg  IV Q8H Monitor renal fxn, clinical progress F/u de-escalation plan/LOT, vancomycin AUC as indicated   Height: 5\' 8"  (172.7 cm) Weight: 176 lb 5.9 oz (80 kg) IBW/kg (Calculated) : 68.4  Temp (24hrs), Avg:98.8 F (37.1 C), Min:98.5 F (36.9 C), Max:99.4 F (37.4 C)  Recent Labs  Lab 04/01/18 1008 04/01/18 1014 04/02/18 2237 04/03/18 0054 04/03/18 1136 04/04/18 0213 04/05/18 0355 04/06/18 0254  WBC 9.8  --  13.3*  --   --  12.5* 13.9* 11.0*  CREATININE 1.79*  --  2.16*  --  2.03* 1.92* 1.85* 1.72*  LATICACIDVEN  --  1.30  --  1.12  --   --   --   --     Estimated Creatinine Clearance: 38.1 mL/min (A) (by C-G formula based on SCr of 1.72 mg/dL (H)).    No Known Allergies   Vanc 1/3 >> Cefepime 1/3 >> Flagyl 1/3 >>  1/3 MRSA PCR - negative 1/3 UCx - negative 1/3 BCx - NGTD 1/4 resp panel PCR - negative   Amor Packard D. Mina Marble, PharmD, BCPS, [place] 04/06/2018, 1:17 PM

## 2018-04-08 LAB — CULTURE, BLOOD (ROUTINE X 2)
Culture: NO GROWTH
Culture: NO GROWTH

## 2018-04-26 ENCOUNTER — Emergency Department (HOSPITAL_COMMUNITY)
Admission: EM | Admit: 2018-04-26 | Discharge: 2018-04-26 | Disposition: A | Discharge status: Home / Self Care | Payer: Medicare Other | Attending: Emergency Medicine | Admitting: Emergency Medicine

## 2018-04-26 ENCOUNTER — Encounter (HOSPITAL_COMMUNITY): Payer: Self-pay | Admitting: *Deleted

## 2018-04-26 ENCOUNTER — Other Ambulatory Visit: Payer: Self-pay

## 2018-04-26 DIAGNOSIS — F0390 Unspecified dementia without behavioral disturbance: Secondary | ICD-10-CM | POA: Diagnosis not present

## 2018-04-26 DIAGNOSIS — E119 Type 2 diabetes mellitus without complications: Secondary | ICD-10-CM | POA: Diagnosis not present

## 2018-04-26 DIAGNOSIS — R310 Gross hematuria: Secondary | ICD-10-CM | POA: Insufficient documentation

## 2018-04-26 DIAGNOSIS — F17210 Nicotine dependence, cigarettes, uncomplicated: Secondary | ICD-10-CM | POA: Insufficient documentation

## 2018-04-26 DIAGNOSIS — Z960 Presence of urogenital implants: Secondary | ICD-10-CM | POA: Diagnosis not present

## 2018-04-26 DIAGNOSIS — Z856 Personal history of leukemia: Secondary | ICD-10-CM | POA: Diagnosis not present

## 2018-04-26 DIAGNOSIS — Z978 Presence of other specified devices: Secondary | ICD-10-CM

## 2018-04-26 DIAGNOSIS — R319 Hematuria, unspecified: Secondary | ICD-10-CM | POA: Diagnosis present

## 2018-04-26 DIAGNOSIS — I10 Essential (primary) hypertension: Secondary | ICD-10-CM | POA: Insufficient documentation

## 2018-04-26 DIAGNOSIS — Z794 Long term (current) use of insulin: Secondary | ICD-10-CM | POA: Insufficient documentation

## 2018-04-26 DIAGNOSIS — Z7982 Long term (current) use of aspirin: Secondary | ICD-10-CM | POA: Insufficient documentation

## 2018-04-26 DIAGNOSIS — Z79899 Other long term (current) drug therapy: Secondary | ICD-10-CM | POA: Diagnosis not present

## 2018-04-26 LAB — URINALYSIS, ROUTINE W REFLEX MICROSCOPIC
Bacteria, UA: NONE SEEN
Bilirubin Urine: NEGATIVE
Glucose, UA: 50 mg/dL — AB
Ketones, ur: NEGATIVE mg/dL
Nitrite: NEGATIVE
Protein, ur: 100 mg/dL — AB
RBC / HPF: >50 RBC/hpf — ABNORMAL HIGH (ref 0–5)
Specific Gravity, Urine: 1.008 (ref 1.005–1.030)
WBC, UA: >50 WBC/hpf — ABNORMAL HIGH (ref 0–5)
pH: 8 (ref 5.0–8.0)

## 2018-04-26 LAB — CBC WITH DIFFERENTIAL/PLATELET
Abs Immature Granulocytes: 0.03 10*3/uL (ref 0.00–0.07)
Basophils Absolute: 0.1 10*3/uL (ref 0.0–0.1)
Basophils Relative: 1 %
EOS PCT: 4 %
Eosinophils Absolute: 0.4 10*3/uL (ref 0.0–0.5)
HCT: 28.6 % — ABNORMAL LOW (ref 39.0–52.0)
Hemoglobin: 8.1 g/dL — ABNORMAL LOW (ref 13.0–17.0)
Immature Granulocytes: 0 %
Lymphocytes Relative: 24 %
Lymphs Abs: 2.1 10*3/uL (ref 0.7–4.0)
MCH: 27.8 pg (ref 26.0–34.0)
MCHC: 28.3 g/dL — AB (ref 30.0–36.0)
MCV: 98.3 fL (ref 80.0–100.0)
Monocytes Absolute: 0.9 10*3/uL (ref 0.1–1.0)
Monocytes Relative: 11 %
NRBC: 0 % (ref 0.0–0.2)
Neutro Abs: 5.1 10*3/uL (ref 1.7–7.7)
Neutrophils Relative %: 60 %
Platelets: 221 10*3/uL (ref 150–400)
RBC: 2.91 MIL/uL — ABNORMAL LOW (ref 4.22–5.81)
RDW: 13.6 % (ref 11.5–15.5)
WBC: 8.6 10*3/uL (ref 4.0–10.5)

## 2018-04-26 LAB — BASIC METABOLIC PANEL
Anion gap: 9 (ref 5–15)
BUN: 28 mg/dL — ABNORMAL HIGH (ref 8–23)
CALCIUM: 9.1 mg/dL (ref 8.9–10.3)
CO2: 22 mmol/L (ref 22–32)
Chloride: 105 mmol/L (ref 98–111)
Creatinine, Ser: 1.83 mg/dL — ABNORMAL HIGH (ref 0.61–1.24)
GFR calc Af Amer: 42 mL/min — ABNORMAL LOW (ref >60)
GFR, EST NON AFRICAN AMERICAN: 36 mL/min — AB (ref >60)
Glucose, Bld: 204 mg/dL — ABNORMAL HIGH (ref 70–99)
Potassium: 4.7 mmol/L (ref 3.5–5.1)
Sodium: 136 mmol/L (ref 135–145)

## 2018-04-26 MED ORDER — HYDROCODONE-ACETAMINOPHEN 5-325 MG PO TABS
1.0000 | ORAL_TABLET | Freq: Once | ORAL | Status: AC
  Administered 2018-04-26: 1 via ORAL
  Filled 2018-04-26: qty 1

## 2018-04-26 NOTE — ED Triage Notes (Signed)
His urine is the color of cranberry juice

## 2018-04-26 NOTE — ED Notes (Signed)
The pt is aware of who he is  And he thinks hes in a hospital  He does not know the day month year or who the president is currently

## 2018-04-26 NOTE — ED Triage Notes (Signed)
The pt arrived by [facility] from [facility].  He has foley catheter and the staff just noticed today that his catheter tubing had blood in it.  He has pain in his penis

## 2018-04-26 NOTE — Discharge Instructions (Addendum)
Foley catheter is appropriately placed.  Bulb is in the bladder.  His leg bag was not attached to his leg and placing traction on the catheter.  This may be causing his pain and bleeding.  His hemoglobin is at his baseline without evidence for acute anemia.  Urine with white blood cells but he does not appear to be systemically infected.  Urine culture collected.  Low suspicion for infection at this time.  Kidney function is at baseline.

## 2018-04-26 NOTE — ED Notes (Signed)
Report called to [facility] by provider

## 2018-04-26 NOTE — ED Provider Notes (Signed)
[facility] EMERGENCY DEPARTMENT Provider Note   CSN: [id number] Arrival date & time: 04/26/18  1750    History   Chief Complaint Chief Complaint  Patient presents with  . Hematuria    HPI Manuel Randolph. is a 73 y.o. male.  HPI   Manuel Randolph. is a 73 y.o. male with PMH of anemia, diabetes, GERD, HLD, HTN, [name] on who presents with report of blood in his urine catheter tubing and pain in his penis from nursing facility.  Patient is not able to provide significant history given his history of dementia.  On April 06, 2018 the patient was discharged from the hospital after being admitted from our emergency department with fever and suspected urinary tract infection.  However, urine studies were negative.  Foley catheter was exchanged for a larger catheter with hemostasis achieved anemia at baseline.  Discharged back to facility with urology follow-up.  He has not yet seen urology in clinic .  Past Medical History:  Diagnosis Date  . Anemia   . Diabetes ([place])   . GERD (gastroesophageal reflux disease)   . Hyperlipidemia   . Hypertension   . Leukemia [facility])     Patient Active Problem List   Diagnosis Date Noted  . SIRS (systemic inflammatory response syndrome) ([place]) 04/03/2018  . Acute lower UTI 04/03/2018  . Acute kidney injury superimposed on CKD ([place]) 04/03/2018  . Acute pancreatitis without infection or necrosis   . Hypothermia 04/10/2017  . AKI (acute kidney injury) ([place]) 04/10/2017  . Hyperkalemia 04/10/2017  . Acute metabolic encephalopathy 76/72/0947  . Dementia ([place]) 04/10/2017  . Sepsis ([place]) 04/10/2017  . HCAP (healthcare-associated pneumonia) 04/10/2017  . Rhabdomyolysis 11/14/2016  . Multiple lacunar infarcts 11/06/2016  . Small vessel disease, cerebrovascular 11/06/2016  . Syncope 09/13/2016  . Dermatitis 03/01/2016  . Anemia, pernicious 07/04/2015  . Hip pain 07/04/2015  . Aortic valve sclerosis 02/19/2015  . Chronic  lymphocytic leukemia ([place]) 02/19/2015  . DM type 2 with diabetic peripheral neuropathy ([place]) 02/19/2015  . Essential (primary) hypertension 02/19/2015  . DM type 2 with diabetic mixed hyperlipidemia ([place]) 02/19/2015  . Diabetic peripheral neuropathy associated with type 2 diabetes mellitus ([place]) 02/19/2015  . Bleeding ulcer 02/19/2015  . Peripheral vascular disease ([place]) 02/19/2015  . 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 Encounter for screening for malignant neoplasm of prostate 02/19/2015  . Type 2 diabetes mellitus with stage 3 chronic kidney disease ([place]) 02/19/2015  . Compulsive tobacco user syndrome 02/19/2015  . 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  . Aortic valve defect 12/22/2012  . 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[place]) 11/13/2011    Past Surgical History:  Procedure Laterality Date  . CATARACT EXTRACTION W/PHACO Left 09/13/2014   Procedure: CATARACT EXTRACTION PHACO AND INTRAOCULAR LENS PLACEMENT (IOC);  Surgeon: Birder Robson, MD;  Location: [facility] ORS;  Service: Ophthalmology;  Laterality: Left;  [place] 01:03   . COLONOSCOPY  2014  . POPLITEAL ARTERY ANGIOPLASTY Left 2014  . PTCA Right    leg  . UPPER GASTROINTESTINAL ENDOSCOPY  2014        Home Medications    Prior to Admission medications   Medication Sig Start Date End Date Taking? Authorizing Provider  acetaminophen (TYLENOL) 500 MG tablet Take 500 mg by mouth every 4 (four) hours as needed for mild pain, fever or headache. NOT TO EXCEED 2000 MG IN 24 HOURS    [provider]  alum & mag hydroxide-simeth ([place])  200-200-20 MG/5ML suspension Take 30 mLs by mouth every 6 (six) hours as needed for indigestion or heartburn. NOT TO EXCEED 4 DOSES IN 24 HOURS    [provider]  amLODipine (NORVASC) 10 MG tablet Take 1 tablet (10 mg total) by mouth daily. 04/15/17   [name], MD  aspirin EC 81 MG tablet Take 81 mg by mouth daily.    [provider]  atorvastatin (LIPITOR) 10 MG tablet Take 10 mg by mouth at bedtime.     [provider]  docusate sodium (COLACE) 100 MG capsule Take 1 capsule (100 mg total) by mouth every 12 (twelve) hours. 03/26/18   Mesner, Corene Cornea, MD  gabapentin (NEURONTIN) 300 MG capsule TAKE 1 CAPSULE BY MOUTH 3  TIMES DAILY Patient taking differently: Take 300 mg by mouth 3 (three) times daily.  08/30/16   [name], MD  glucose blood test strip Use as instructed 03/06/15   [name], MD  guaiFENesin (ROBITUSSIN) 100 MG/5ML liquid Take 100 mg by mouth 3 (three) times daily as needed for cough.     [provider]  hydrALAZINE (APRESOLINE) 25 MG tablet Take 25 mg by mouth 3 (three) times daily. Take 25 mg three times a day and 75 mg every 8 hours.    [provider]  hydrALAZINE (APRESOLINE) 50 MG tablet Take 1.5 tablets (75 mg total) by mouth every 8 (eight) hours. 04/15/17   [name], MD  insulin glargine (LANTUS) 100 UNIT/ML injection Inject 0.12 mLs (12 Units total) into the skin daily. 04/16/17   [name], MD  insulin lispro (HUMALOG KWIKPEN) 100 UNIT/ML KiwkPen Inject 3 Units into the skin 3 (three) times daily. Hold if meal time BS is 100 or less. Call MD if BS greater than 450    [provider]  Lancets 28G MISC 1 each by Does not apply route daily. 03/06/15   [name], MD  loperamide (IMODIUM) 2 MG capsule Take 2 mg by mouth as needed for diarrhea or loose stools. DO NOT EXCEED 8 DOSES IN 24 HOURS    [provider]  magnesium hydroxide (MILK OF MAGNESIA) 400 MG/5ML suspension Take 30 mLs by mouth at bedtime as needed for mild constipation or moderate constipation.    [provider]  Multiple Vitamin (MULTIVITAMIN WITH MINERALS) TABS tablet Take 1 tablet by mouth daily.    [provider]  neomycin-bacitracin-polymyxin (NEOSPORIN) ointment Apply 1 application topically every 8 (eight) hours as needed  for wound care.     [provider]  PARoxetine (PAXIL) 20 MG tablet Take 20 mg by mouth daily.    [provider]  polyethylene glycol (MIRALAX / GLYCOLAX) packet Take 17 g by mouth daily as needed for moderate constipation. 03/26/18   Mesner, Corene Cornea, MD  QUEtiapine (SEROQUEL) 25 MG tablet Take 1 tablet (25 mg total) by mouth at bedtime. 04/15/17   [name], MD  triamcinolone cream (KENALOG) 0.1 % APPLY TO AFFECTED AREA TWICE A DAY Patient taking differently: Apply 1 application topically 2 (two) times daily. APPLY TO AFFECTED AREA TWICE A DAY 08/07/16   Daleen Bo, MD    Family History Family History  Problem Relation Age of Onset  . Diabetes Mother   . Diabetes Father     Social History Social History   Tobacco Use  . Smoking status: Current Every Day Smoker    Packs/day: 0.50    Types: Cigarettes  . Smokeless  tobacco: Never Used  Substance Use Topics  . Alcohol use: No    Alcohol/week: 0.0 standard drinks  . Drug use: No     Allergies   Patient has no known allergies.   Review of Systems Review of Systems  Constitutional: Negative for chills and fever.  HENT: Negative for ear pain and sore throat.   Eyes: Negative for pain and visual disturbance.  Respiratory: Negative for cough and shortness of breath.   Cardiovascular: Negative for chest pain and palpitations.  Gastrointestinal: Negative for abdominal pain and vomiting.  Genitourinary: Positive for hematuria and penile pain. Negative for decreased urine volume, dysuria, penile swelling, scrotal swelling and testicular pain.  Musculoskeletal: Negative for arthralgias and back pain.  Skin: Negative for color change and rash.  Neurological: Negative for seizures and syncope.  All other systems reviewed and are negative.    Physical Exam Updated Vital Signs BP (!) 153/77   Pulse 69   Temp 98.4 F (36.9 C)   Resp 18   SpO2 96%   Physical Exam Vitals signs and nursing note  reviewed.  Constitutional:      Appearance: Normal appearance. He is well-developed. He is not ill-appearing.  HENT:     Head: Normocephalic and atraumatic.  Eyes:     Conjunctiva/sclera: Conjunctivae normal.  Neck:     Musculoskeletal: Neck supple.  Cardiovascular:     Rate and Rhythm: Normal rate and regular rhythm.     Heart sounds: No murmur.  Pulmonary:     Effort: Pulmonary effort is normal. No respiratory distress.     Breath sounds: Normal breath sounds.  Abdominal:     General: Abdomen is flat.     Palpations: Abdomen is soft.     Tenderness: There is no abdominal tenderness. Negative signs include Murphy's sign, Rovsing's sign and McBurney's sign.  Genitourinary:    Penis: No phimosis, paraphimosis, tenderness or discharge.   Skin:    General: Skin is warm and dry.  Neurological:     General: No focal deficit present.     Mental Status: He is alert.     GCS: GCS eye subscore is 4. GCS verbal subscore is 4. GCS motor subscore is 6.     Cranial Nerves: Cranial nerves are intact.     Motor: Tremor (Mild resting) present.  Psychiatric:        Behavior: Behavior is cooperative.      ED Treatments / Results  Labs (all labs ordered are listed, but only abnormal results are displayed) Labs Reviewed  CBC WITH DIFFERENTIAL/PLATELET - Abnormal; Notable for the following components:      Result Value   RBC 2.91 (*)    Hemoglobin 8.1 (*)    HCT 28.6 (*)    MCHC 28.3 (*)    All other components within normal limits  BASIC METABOLIC PANEL - Abnormal; Notable for the following components:   Glucose, Bld 204 (*)    BUN 28 (*)    Creatinine, Ser 1.83 (*)    GFR calc non Af Amer 36 (*)    GFR calc Af Amer 42 (*)    All other components within normal limits  URINALYSIS, ROUTINE W REFLEX MICROSCOPIC - Abnormal; Notable for the following components:   APPearance HAZY (*)    Glucose, UA 50 (*)    Hgb urine dipstick LARGE (*)    Protein, ur 100 (*)    Leukocytes, UA LARGE  (*)    RBC / HPF >50 (*)  WBC, UA >50 (*)    All other components within normal limits  URINE CULTURE    EKG None  Radiology No results found.  Procedures Procedures (including critical care time)  EMERGENCY DEPARTMENT ULTRASOUND  Study: Limited Ultrasound of Bladder  INDICATIONS: to assess for urinary retention and/or bladder volume prior to urinary catheter Multiple views of the bladder were obtained in real-time in the transverse and longitudinal planes with a multi-frequency probe.  PERFORMED BY: Myself IMAGES ARCHIVED?: Yes LIMITATIONS:  none INTERPRETATION: decompressed, foley bulb visualized in suprapubic space with no free fluid in pelvis        Medications Ordered in ED Medications  HYDROcodone-acetaminophen (NORCO/VICODIN) 5-325 MG per tablet 1 tablet (1 tablet Oral Given 04/26/18 1841)     Initial Impression / Assessment and Plan / ED Course  I have reviewed the triage vital signs and the nursing notes.  Pertinent labs & imaging results that were available during my care of the patient were reviewed by me and considered in my medical decision making (see chart for details).     MDM:  Imaging: BSUS as below   ED Provider Interpretation of EKG: None indicated at this time.    Labs: CBC with a white count of 8 and no left shift, hemoglobin 8.1 at his baseline, platelets 221, BMP with creatinine of 1.8 which is his baseline and BUN 28 which is at his baseline as well.  UA with greater than 50 WBC and RBC but nitrite negative.  Proteinuria.  Hazy appearance.  On initial evaluation, patient appears chronically ill but in no acute distress. Afebrile and hemodynamically stable. Alert and appears to be at his baseline orientation level knowing that he is in [place] at the hospital but otherwise disoriented.  On exam, patient has pain along the penile shaft without evidence for acute pathology.  No phimosis or paraphimosis.  No bleeding at this time.  Foley  catheter in place.  Bedside ultrasound performed with catheter bulb and what appears to be decompressed bladder.  No free fluid in the pelvis.  Testicles and scrotum normal.  No hernia.  Mild to moderate gross hematuria and bag with yellow urine.  Greater than 50 WBC and RBC although nitrite negative.  Patient has no systemic evidence for infection without leukocytosis or fever.  He denies subjective constitutional signs or symptoms.  States he feels normal.  Tolerating p.o. without difficulty.  No back pain.  Low suspicion for active infection but urine culture is in process.  Will not give antibiotics at this time.  P.o. Norco in the ED for pain.  Patient's leg bag was not connected to his leg and was pulling traction on his penis.  Suspect that the traction on his penis and balled at the base of his penis as well as at the exit of the bladder may be causing inflammation.  His Foley catheter is draining.  Renal function is at baseline.  No evidence for obstruction.  Called the facility and spoke to the tech on call.  Informed her of these findings.  His hemoglobin is at his baseline.  He is stable to discharge back to their facility with follow-up with his primary care physician and urologist.  Given return precautions.  The plan for this patient was discussed with Dr. Dayna Barker who voiced agreement and who oversaw evaluation and treatment of this patient.   The patient was fully informed and involved with the history taking, evaluation, workup including labs/images, and plan. The patient's concerns  and questions were addressed to the patient's satisfaction and he expressed agreement with the plan to DC home.    Final Clinical Impressions(s) / ED Diagnoses   Final diagnoses:  Gross hematuria  Indwelling Foley catheter present    ED Discharge Orders    None       Turkessa Ostrom, Rodena Goldmann, MD 04/26/18 2158    Merrily Pew, MD 04/27/18 [id number]

## 2018-04-27 ENCOUNTER — Other Ambulatory Visit: Payer: Self-pay

## 2018-04-27 ENCOUNTER — Encounter (HOSPITAL_COMMUNITY): Payer: Self-pay

## 2018-04-27 ENCOUNTER — Emergency Department (HOSPITAL_COMMUNITY)
Admission: EM | Admit: 2018-04-27 | Discharge: 2018-04-27 | Disposition: A | Discharge status: Home / Self Care | Payer: Medicare Other | Attending: Emergency Medicine | Admitting: Emergency Medicine

## 2018-04-27 DIAGNOSIS — Y731 Therapeutic (nonsurgical) and rehabilitative gastroenterology and urology devices associated with adverse incidents: Secondary | ICD-10-CM | POA: Insufficient documentation

## 2018-04-27 DIAGNOSIS — T83021A Displacement of indwelling urethral catheter, initial encounter: Secondary | ICD-10-CM | POA: Diagnosis not present

## 2018-04-27 DIAGNOSIS — Y929 Unspecified place or not applicable: Secondary | ICD-10-CM | POA: Insufficient documentation

## 2018-04-27 DIAGNOSIS — E1169 Type 2 diabetes mellitus with other specified complication: Secondary | ICD-10-CM | POA: Insufficient documentation

## 2018-04-27 DIAGNOSIS — Y999 Unspecified external cause status: Secondary | ICD-10-CM | POA: Insufficient documentation

## 2018-04-27 DIAGNOSIS — E782 Mixed hyperlipidemia: Secondary | ICD-10-CM | POA: Diagnosis not present

## 2018-04-27 DIAGNOSIS — E1122 Type 2 diabetes mellitus with diabetic chronic kidney disease: Secondary | ICD-10-CM | POA: Insufficient documentation

## 2018-04-27 DIAGNOSIS — E1140 Type 2 diabetes mellitus with diabetic neuropathy, unspecified: Secondary | ICD-10-CM | POA: Insufficient documentation

## 2018-04-27 DIAGNOSIS — F0390 Unspecified dementia without behavioral disturbance: Secondary | ICD-10-CM | POA: Diagnosis not present

## 2018-04-27 DIAGNOSIS — Z794 Long term (current) use of insulin: Secondary | ICD-10-CM | POA: Diagnosis not present

## 2018-04-27 DIAGNOSIS — Y939 Activity, unspecified: Secondary | ICD-10-CM | POA: Diagnosis not present

## 2018-04-27 DIAGNOSIS — F17210 Nicotine dependence, cigarettes, uncomplicated: Secondary | ICD-10-CM | POA: Insufficient documentation

## 2018-04-27 DIAGNOSIS — N183 Chronic kidney disease, stage 3 (moderate): Secondary | ICD-10-CM | POA: Insufficient documentation

## 2018-04-27 DIAGNOSIS — S30812A Abrasion of penis, initial encounter: Secondary | ICD-10-CM | POA: Diagnosis not present

## 2018-04-27 DIAGNOSIS — S3994XA Unspecified injury of external genitals, initial encounter: Secondary | ICD-10-CM | POA: Diagnosis present

## 2018-04-27 MED ORDER — BACITRACIN ZINC 500 UNIT/GM EX OINT
TOPICAL_OINTMENT | Freq: Once | CUTANEOUS | Status: AC
  Administered 2018-04-27: 1 via TOPICAL
  Filled 2018-04-27: qty 0.9

## 2018-04-27 MED ORDER — BACITRACIN ZINC 500 UNIT/GM EX OINT: 1.0000 "application " | TOPICAL_OINTMENT | Freq: Two times a day (BID) | CUTANEOUS | 0 refills | Status: AC

## 2018-04-27 MED ORDER — LIDOCAINE HCL URETHRAL/MUCOSAL 2 % EX GEL
1.0000 "application " | Freq: Once | CUTANEOUS | Status: AC
  Administered 2018-04-27: 1 via URETHRAL
  Filled 2018-04-27: qty 5

## 2018-04-27 NOTE — ED Triage Notes (Signed)
Per EMS, Pt is presenting from [facility]. Pt recently had problems related to his catheter due to it being improperly secured. Tonight while his nurse removed the catheter, and was unable to insert another catheter. Pt has htx of dementia and COPD, currently at baseline, no SOB.

## 2018-04-27 NOTE — ED Notes (Signed)
Notified PTAR for transpiration.

## 2018-04-27 NOTE — ED Provider Notes (Signed)
[facility] DEPT Provider Note   CSN: [id number] Arrival date & time: 04/27/18  2030  LEVEL 5 CAVEAT - DEMENTIA    History   Chief Complaint Chief Complaint  Patient presents with  . Urinary Retention    Pts indwelling catheter unable to be replaced.     HPI Manuel SHEVLIN Sr. is a 73 y.o. male.  HPI  73 year old male sent from nursing home after his Foley catheter was unable to be reinserted.  He chronically has a Foley catheter and it was removed to be replaced by the nurse.  Was unable to replace it.  The patient otherwise is resting comfortably and denies any acute complaints.  Past Medical History:  Diagnosis Date  . Anemia   . Diabetes ([place])   . GERD (gastroesophageal reflux disease)   . Hyperlipidemia   . Hypertension   . Leukemia [facility])     Patient Active Problem List   Diagnosis Date Noted  . SIRS (systemic inflammatory response syndrome) ([place]) 04/03/2018  . Acute lower UTI 04/03/2018  . Acute kidney injury superimposed on CKD ([place]) 04/03/2018  . Acute pancreatitis without infection or necrosis   . Hypothermia 04/10/2017  . AKI (acute kidney injury) ([place]) 04/10/2017  . Hyperkalemia 04/10/2017  . Acute metabolic encephalopathy 00/93/8182  . Dementia ([place]) 04/10/2017  . Sepsis ([place]) 04/10/2017  . HCAP (healthcare-associated pneumonia) 04/10/2017  . Rhabdomyolysis 11/14/2016  . Multiple lacunar infarcts 11/06/2016  . Small vessel disease, cerebrovascular 11/06/2016  . Syncope 09/13/2016  . Dermatitis 03/01/2016  . Anemia, pernicious 07/04/2015  . Hip pain 07/04/2015  . Aortic valve sclerosis 02/19/2015  . Chronic lymphocytic leukemia ([place]) 02/19/2015  . DM type 2 with diabetic peripheral neuropathy ([place]) 02/19/2015  . Essential (primary) hypertension 02/19/2015  . DM type 2 with diabetic mixed hyperlipidemia ([place]) 02/19/2015  . Diabetic peripheral neuropathy associated with type 2 diabetes mellitus ([place]) 02/19/2015  .  Bleeding ulcer 02/19/2015  . Peripheral vascular disease ([place]) 02/19/2015  . 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 Encounter for screening for malignant neoplasm of prostate 02/19/2015  . Type 2 diabetes mellitus with stage 3 chronic kidney disease ([place]) 02/19/2015  . Compulsive tobacco user syndrome 02/19/2015  . 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  . Aortic valve defect 12/22/2012  . 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[place]) 11/13/2011    Past Surgical History:  Procedure Laterality Date  . CATARACT EXTRACTION W/PHACO Left 09/13/2014   Procedure: CATARACT EXTRACTION PHACO AND INTRAOCULAR LENS PLACEMENT (IOC);  Surgeon: Birder Robson, MD;  Location: [facility] ORS;  Service: Ophthalmology;  Laterality: Left;  [place] 01:03   . COLONOSCOPY  2014  . POPLITEAL ARTERY ANGIOPLASTY Left 2014  . PTCA Right    leg  . UPPER GASTROINTESTINAL ENDOSCOPY  2014        Home Medications    Prior to Admission medications   Medication Sig Start Date End Date Taking? Authorizing Provider  acetaminophen (TYLENOL) 500 MG tablet Take 500 mg by mouth every 4 (four) hours as needed for mild pain, fever or headache. NOT TO EXCEED 2000 MG IN 24 HOURS    [provider]  alum & mag hydroxide-simeth (MINTOX) 993-716-96 MG/5ML suspension Take 30 mLs by mouth every 6 (six) hours as needed for indigestion or heartburn. NOT TO EXCEED 4 DOSES IN 24 HOURS    [provider]  amLODipine (NORVASC) 10 MG tablet Take 1 tablet (10 mg total) by mouth daily. 04/15/17   [place] Silos,  Malachy Moan, MD  aspirin EC 81 MG tablet Take 81 mg by mouth daily.    [provider]  atorvastatin (LIPITOR) 10 MG tablet Take 10 mg by mouth at bedtime.     [provider]  bacitracin ointment Apply 1 application topically 2 (two) times daily for 5 days. Apply to penis 04/27/18 05/02/18  Sherwood Gambler, MD  docusate sodium (COLACE) 100 MG capsule Take 1 capsule (100 mg  total) by mouth every 12 (twelve) hours. 03/26/18   Mesner, Corene Cornea, MD  gabapentin (NEURONTIN) 300 MG capsule TAKE 1 CAPSULE BY MOUTH 3  TIMES DAILY Patient taking differently: Take 300 mg by mouth 3 (three) times daily.  08/30/16   [name], MD  glucose blood test strip Use as instructed 03/06/15   [name], MD  guaiFENesin (ROBITUSSIN) 100 MG/5ML liquid Take 100 mg by mouth 3 (three) times daily as needed for cough.     [provider]  hydrALAZINE (APRESOLINE) 25 MG tablet Take 25 mg by mouth 3 (three) times daily. Take 25 mg three times a day and 75 mg every 8 hours.    [provider]  hydrALAZINE (APRESOLINE) 50 MG tablet Take 1.5 tablets (75 mg total) by mouth every 8 (eight) hours. 04/15/17   [name], MD  insulin glargine (LANTUS) 100 UNIT/ML injection Inject 0.12 mLs (12 Units total) into the skin daily. 04/16/17   [name], MD  insulin lispro (HUMALOG KWIKPEN) 100 UNIT/ML KiwkPen Inject 3 Units into the skin 3 (three) times daily. Hold if meal time BS is 100 or less. Call MD if BS greater than 450    [provider]  Lancets 28G MISC 1 each by Does not apply route daily. 03/06/15   [name], MD  loperamide (IMODIUM) 2 MG capsule Take 2 mg by mouth as needed for diarrhea or loose stools. DO NOT EXCEED 8 DOSES IN 24 HOURS    [provider]  magnesium hydroxide (MILK OF MAGNESIA) 400 MG/5ML suspension Take 30 mLs by mouth at bedtime as needed for mild constipation or moderate constipation.    [provider]  Multiple Vitamin (MULTIVITAMIN WITH MINERALS) TABS tablet Take 1 tablet by mouth daily.    [provider]  neomycin-bacitracin-polymyxin (NEOSPORIN) ointment Apply 1 application topically every 8 (eight) hours as needed for wound care.     [provider]  PARoxetine (PAXIL) 20 MG tablet Take 20 mg by mouth daily.    [provider]  polyethylene glycol (MIRALAX / GLYCOLAX)  packet Take 17 g by mouth daily as needed for moderate constipation. 03/26/18   Mesner, Corene Cornea, MD  QUEtiapine (SEROQUEL) 25 MG tablet Take 1 tablet (25 mg total) by mouth at bedtime. 04/15/17   [name], MD  triamcinolone cream (KENALOG) 0.1 % APPLY TO AFFECTED AREA TWICE A DAY Patient taking differently: Apply 1 application topically 2 (two) times daily. APPLY TO AFFECTED AREA TWICE A DAY 08/07/16   Daleen Bo, MD    Family History Family History  Problem Relation Age of Onset  . Diabetes Mother   . Diabetes Father     Social History Social History   Tobacco Use  . Smoking status: Current Every Day Smoker    Packs/day: 0.50    Types: Cigarettes  . Smokeless tobacco: Never Used  Substance Use Topics  . Alcohol use: No    Alcohol/week: 0.0 standard drinks  . Drug use: No  Allergies   Patient has no known allergies.   Review of Systems Review of Systems  Unable to perform ROS: Dementia     Physical Exam Updated Vital Signs BP (!) 159/58   Pulse 72   Temp 98 F (36.7 C) (Oral)   Resp 19   SpO2 98%   Physical Exam Vitals signs and nursing note reviewed.  Constitutional:      Appearance: He is well-developed.  HENT:     Head: Normocephalic and atraumatic.     Right Ear: External ear normal.     Left Ear: External ear normal.     Nose: Nose normal.  Eyes:     General:        Right eye: No discharge.        Left eye: No discharge.  Neck:     Musculoskeletal: Neck supple.  Cardiovascular:     Rate and Rhythm: Normal rate and regular rhythm.     Heart sounds: Murmur present.  Pulmonary:     Effort: Pulmonary effort is normal.     Breath sounds: Normal breath sounds.  Abdominal:     General: There is no distension.     Palpations: Abdomen is soft.     Tenderness: There is no abdominal tenderness.  Genitourinary:    Comments: Tip of penis appears to have a small abrasion. Also some mild discharge around tip, does not appear to be coming  directly from the urethra but rather surrounding Skin:    General: Skin is warm and dry.  Neurological:     Mental Status: He is alert.  Psychiatric:        Mood and Affect: Mood is not anxious.      ED Treatments / Results  Labs (all labs ordered are listed, but only abnormal results are displayed) Labs Reviewed - No data to display  EKG None  Radiology No results found.  Procedures Procedures (including critical care time)  Medications Ordered in ED Medications  lidocaine (XYLOCAINE) 2 % jelly 1 application (1 application Urethral Given 04/27/18 2158)  bacitracin ointment (1 application Topical Given 04/27/18 2158)     Initial Impression / Assessment and Plan / ED Course  I have reviewed the triage vital signs and the nursing notes.  Pertinent labs & imaging results that were available during my care of the patient were reviewed by me and considered in my medical decision making (see chart for details).     Patient's catheter was easily replaced by the nurse.  He does have a little bit of what appears to be an abrasion with some discharge and so topical antibiotics will be placed as well.  Otherwise his vitals show hypertension but no other acute abnormality such as fever.  Stable for discharge back to his facility.  I discussed with his POA, Manuel Randolph, and updated her on the plan.  Final Clinical Impressions(s) / ED Diagnoses   Final diagnoses:  Dislodged Foley catheter, initial encounter ([place])  Penis abrasion, initial encounter    ED Discharge Orders         Ordered    bacitracin ointment  2 times daily     04/27/18 2157           Sherwood Gambler, MD 04/27/18 2300

## 2018-04-27 NOTE — ED Notes (Signed)
Foley catheter placed by Z. Teeters Therapist, sports

## 2018-04-27 NOTE — ED Notes (Signed)
Bed: WA08 Expected date:  Expected time:  Means of arrival:  Comments: 73 yr old catheter dislodged

## 2018-04-29 LAB — URINE CULTURE: Culture: >=100000 — AB

## 2018-04-30 ENCOUNTER — Telehealth: Payer: Self-pay | Admitting: *Deleted

## 2018-04-30 NOTE — Telephone Encounter (Signed)
Post ED Visit - Positive Culture Follow-up: Unsuccessful Patient Follow-up  Culture assessed and recommendations reviewed by:  []  Elenor Quinones, Pharm.D. []  Heide Guile, Pharm.D., BCPS AQ-ID []  [place] Neptune, Pharm.D., BCPS []  Alycia Rossetti, Pharm.D., BCPS []  [place], Pharm.D., BCPS, AAHIVP []  Legrand Como, Pharm.D., BCPS, AAHIVP []  Wynell Balloon, PharmD []  Vincenza Hews, PharmD, BCPS  Positive urine culture  [x]  Patient discharged without antimicrobial prescription and treatment is now indicated []  Organism is resistant to prescribed ED discharge antimicrobial []  Patient with positive blood cultures  Plan: Cephalexin 500mg  PO BID x 7 days/Kenneth Leaphart, PA  Unable to contact patient after 3 attempts, letter will be sent to address on file  [place] Fillers 04/30/2018, 9:54 AM

## 2018-04-30 NOTE — Progress Notes (Signed)
ED Antimicrobial Stewardship Positive Culture Follow Up   Manuel Randolph. is an 73 y.o. male who presented to [facility] on 04/26/2018 with a chief complaint of  Chief Complaint  Patient presents with  . Hematuria    Recent Results (from the past 720 hour(s))  Blood Culture (routine x 2)     Status: None   Collection Time: 04/01/18 10:23 AM  Result Value Ref Range Status   Specimen Description BLOOD RIGHT ANTECUBITAL  Final   Special Requests   Final    BOTTLES DRAWN AEROBIC AND ANAEROBIC Blood Culture adequate volume   Culture NO GROWTH 5 DAYS  Final   Report Status 04/06/2018 FINAL  Final  Blood Culture (routine x 2)     Status: None   Collection Time: 04/01/18 10:23 AM  Result Value Ref Range Status   Specimen Description BLOOD BLOOD RIGHT FOREARM  Final   Special Requests   Final    AEROBIC BOTTLE ONLY Blood Culture results may not be optimal due to an inadequate volume of blood received in culture bottles   Culture NO GROWTH 5 DAYS  Final   Report Status 04/06/2018 FINAL  Final  Urine C&S     Status: None   Collection Time: 04/02/18 10:21 PM  Result Value Ref Range Status   Specimen Description URINE, RANDOM  Final   Special Requests NONE  Final   Culture   Final    NO GROWTH Performed at [facility], [address], [place], [place] [address]    Report Status 04/04/2018 FINAL  Final  Blood Culture (routine x 2)     Status: None   Collection Time: 04/03/18 12:32 AM  Result Value Ref Range Status   Specimen Description BLOOD RIGHT WRIST  Final   Special Requests   Final    BOTTLES DRAWN AEROBIC AND ANAEROBIC Blood Culture results may not be optimal due to an excessive volume of blood received in culture bottles   Culture   Final    NO GROWTH 5 DAYS Performed at [facility], [place] [address] [place], [place] [address]    Report Status 04/08/2018 FINAL  Final  Blood Culture (routine x 2)     Status: None   Collection Time: 04/03/18 12:43 AM  Result  Value Ref Range Status   Specimen Description BLOOD LEFT WRIST  Final   Special Requests   Final    BOTTLES DRAWN AEROBIC AND ANAEROBIC Blood Culture results may not be optimal due to an excessive volume of blood received in culture bottles   Culture   Final    NO GROWTH 5 DAYS Performed at [facility], [place] [address], [place], [place] [address]    Report Status 04/08/2018 FINAL  Final  MRSA PCR Screening     Status: None   Collection Time: 04/03/18 11:33 AM  Result Value Ref Range Status   MRSA by PCR NEGATIVE NEGATIVE Final    Comment:        The GeneXpert MRSA Assay (FDA approved for NASAL specimens only), is one component of a comprehensive MRSA colonization surveillance program. It is not intended to diagnose MRSA infection nor to guide or monitor treatment for MRSA infections. Performed at [facility], [place] [address] [place], [place] [address]   Respiratory Panel by PCR     Status: None   Collection Time: 04/04/18  5:17 AM  Result Value Ref Range Status   Adenovirus NOT  DETECTED NOT DETECTED Final   Coronavirus 229E NOT DETECTED NOT DETECTED Final   Coronavirus HKU1 NOT DETECTED NOT DETECTED Final   Coronavirus NL63 NOT DETECTED NOT DETECTED Final   Coronavirus OC43 NOT DETECTED NOT DETECTED Final   Metapneumovirus NOT DETECTED NOT DETECTED Final   Rhinovirus / Enterovirus NOT DETECTED NOT DETECTED Final   Influenza A NOT DETECTED NOT DETECTED Final   Influenza B NOT DETECTED NOT DETECTED Final   Parainfluenza Virus 1 NOT DETECTED NOT DETECTED Final   Parainfluenza Virus 2 NOT DETECTED NOT DETECTED Final   Parainfluenza Virus 3 NOT DETECTED NOT DETECTED Final   Parainfluenza Virus 4 NOT DETECTED NOT DETECTED Final   Respiratory Syncytial Virus NOT DETECTED NOT DETECTED Final   Bordetella pertussis NOT DETECTED NOT DETECTED Final   Chlamydophila pneumoniae NOT DETECTED NOT DETECTED Final   Mycoplasma pneumoniae NOT DETECTED NOT DETECTED Final  Urine  culture     Status: Abnormal   Collection Time: 04/26/18  7:26 PM  Result Value Ref Range Status   Specimen Description URINE, CATHETERIZED  Final   Special Requests   Final    NONE Performed at [facility], [place] [address] [place], [place] [address]    Culture >=100,000 COLONIES/mL PROTEUS MIRABILIS (A)  Final   Report Status 04/29/2018 FINAL  Final   Organism ID, Bacteria PROTEUS MIRABILIS (A)  Final      Susceptibility   Proteus mirabilis - MIC*    AMPICILLIN 4 SENSITIVE Sensitive     CEFAZOLIN <=4 SENSITIVE Sensitive     CEFTRIAXONE <=1 SENSITIVE Sensitive     CIPROFLOXACIN >=4 RESISTANT Resistant     GENTAMICIN <=1 SENSITIVE Sensitive     IMIPENEM <=0.25 SENSITIVE Sensitive     NITROFURANTOIN 128 RESISTANT Resistant     TRIMETH/SULFA <=20 SENSITIVE Sensitive     AMPICILLIN/SULBACTAM <=2 SENSITIVE Sensitive     PIP/TAZO <=4 SENSITIVE Sensitive     * >=100,000 COLONIES/mL PROTEUS MIRABILIS    []  Treated with N/A, organism resistant to prescribed antimicrobial [x]  Patient discharged originally without antimicrobial agent and treatment is now indicated  New antibiotic prescription: cephalexin 500mg  PO BID x 7 days  ED Provider: Ocie Cornfield, PA   Elizah Lydon, Rande Lawman 04/30/2018, 8:20 AM Clinical Pharmacist Monday - Friday phone -  [phone] Saturday - Sunday phone - [phone]

## 2018-04-30 NOTE — Telephone Encounter (Signed)
Post ED Visit - Positive Culture Follow-up: Successful Patient Follow-Up  Culture assessed and recommendations reviewed by:  []  Elenor Quinones, Pharm.D. []  Heide Guile, Pharm.D., BCPS AQ-ID []  Parks Neptune, Pharm.D., BCPS []  Alycia Rossetti, Pharm.D., BCPS []  [place], Pharm.D., BCPS, AAHIVP []  Legrand Como, Pharm.D., BCPS, AAHIVP [x]  Salome Arnt, PharmD, BCPS []  Johnnette Gourd, PharmD, BCPS []  [place] Better, PharmD, BCPS []  Leeroy Cha, PharmD  Positive urine culture  [x]  Patient discharged without antimicrobial prescription and treatment is now indicated []  Organism is resistant to prescribed ED discharge antimicrobial []  Patient with positive blood cultures  Changes discussed with ED provider Ocie Cornfield, PA-C New antibiotic prescription Cephalexin 500mg  PO BIC x 7 days Faxed to [facility]. AttDonella Stade  [phone]  Contacted patient, date 04/30/2018, time [place], [place] 04/30/2018, 10:52 AM

## 2018-12-02 ENCOUNTER — Emergency Department (HOSPITAL_COMMUNITY)
Admission: EM | Admit: 2018-12-02 | Discharge: 2018-12-03 | Disposition: A | Discharge status: Home / Self Care | Payer: Medicare Other | Attending: Emergency Medicine | Admitting: Emergency Medicine

## 2018-12-02 ENCOUNTER — Other Ambulatory Visit: Payer: Self-pay

## 2018-12-02 ENCOUNTER — Encounter (HOSPITAL_COMMUNITY): Payer: Self-pay

## 2018-12-02 ENCOUNTER — Emergency Department (HOSPITAL_COMMUNITY): Payer: Medicare Other

## 2018-12-02 DIAGNOSIS — E1122 Type 2 diabetes mellitus with diabetic chronic kidney disease: Secondary | ICD-10-CM | POA: Insufficient documentation

## 2018-12-02 DIAGNOSIS — R339 Retention of urine, unspecified: Secondary | ICD-10-CM | POA: Insufficient documentation

## 2018-12-02 DIAGNOSIS — K5641 Fecal impaction: Secondary | ICD-10-CM | POA: Diagnosis not present

## 2018-12-02 DIAGNOSIS — N183 Chronic kidney disease, stage 3 (moderate): Secondary | ICD-10-CM | POA: Diagnosis not present

## 2018-12-02 DIAGNOSIS — Z79899 Other long term (current) drug therapy: Secondary | ICD-10-CM | POA: Diagnosis not present

## 2018-12-02 DIAGNOSIS — E875 Hyperkalemia: Secondary | ICD-10-CM

## 2018-12-02 DIAGNOSIS — F0390 Unspecified dementia without behavioral disturbance: Secondary | ICD-10-CM | POA: Insufficient documentation

## 2018-12-02 DIAGNOSIS — Z794 Long term (current) use of insulin: Secondary | ICD-10-CM | POA: Diagnosis not present

## 2018-12-02 DIAGNOSIS — I129 Hypertensive chronic kidney disease with stage 1 through stage 4 chronic kidney disease, or unspecified chronic kidney disease: Secondary | ICD-10-CM | POA: Insufficient documentation

## 2018-12-02 DIAGNOSIS — F17210 Nicotine dependence, cigarettes, uncomplicated: Secondary | ICD-10-CM | POA: Insufficient documentation

## 2018-12-02 LAB — CBC WITH DIFFERENTIAL/PLATELET
Abs Immature Granulocytes: 0.01 10*3/uL (ref 0.00–0.07)
Basophils Absolute: 0 10*3/uL (ref 0.0–0.1)
Basophils Relative: 1 %
Eosinophils Absolute: 0.3 10*3/uL (ref 0.0–0.5)
Eosinophils Relative: 5 %
HCT: 29.2 % — ABNORMAL LOW (ref 39.0–52.0)
Hemoglobin: 8.8 g/dL — ABNORMAL LOW (ref 13.0–17.0)
Immature Granulocytes: 0 %
Lymphocytes Relative: 27 %
Lymphs Abs: 1.5 10*3/uL (ref 0.7–4.0)
MCH: 31.8 pg (ref 26.0–34.0)
MCHC: 30.1 g/dL (ref 30.0–36.0)
MCV: 105.4 fL — ABNORMAL HIGH (ref 80.0–100.0)
Monocytes Absolute: 0.7 10*3/uL (ref 0.1–1.0)
Monocytes Relative: 13 %
Neutro Abs: 3.1 10*3/uL (ref 1.7–7.7)
Neutrophils Relative %: 54 %
Platelets: 185 10*3/uL (ref 150–400)
RBC: 2.77 MIL/uL — ABNORMAL LOW (ref 4.22–5.81)
RDW: 18.6 % — ABNORMAL HIGH (ref 11.5–15.5)
WBC: 5.6 10*3/uL (ref 4.0–10.5)
nRBC: 0 % (ref 0.0–0.2)

## 2018-12-02 LAB — COMPREHENSIVE METABOLIC PANEL
ALT: 18 U/L (ref 0–44)
AST: 20 U/L (ref 15–41)
Albumin: 3.7 g/dL (ref 3.5–5.0)
Alkaline Phosphatase: 87 U/L (ref 38–126)
Anion gap: 7 (ref 5–15)
BUN: 46 mg/dL — ABNORMAL HIGH (ref 8–23)
CO2: 24 mmol/L (ref 22–32)
Calcium: 9.1 mg/dL (ref 8.9–10.3)
Chloride: 108 mmol/L (ref 98–111)
Creatinine, Ser: 2.08 mg/dL — ABNORMAL HIGH (ref 0.61–1.24)
GFR calc Af Amer: 36 mL/min — ABNORMAL LOW (ref >60)
GFR calc non Af Amer: 31 mL/min — ABNORMAL LOW (ref >60)
Glucose, Bld: 81 mg/dL (ref 70–99)
Potassium: 5.4 mmol/L — ABNORMAL HIGH (ref 3.5–5.1)
Sodium: 139 mmol/L (ref 135–145)
Total Bilirubin: 0.4 mg/dL (ref 0.3–1.2)
Total Protein: 6.9 g/dL (ref 6.5–8.1)

## 2018-12-02 LAB — I-STAT CHEM 8, ED
BUN: 43 mg/dL — ABNORMAL HIGH (ref 8–23)
Calcium, Ion: 1.27 mmol/L (ref 1.15–1.40)
Chloride: 107 mmol/L (ref 98–111)
Creatinine, Ser: 2.1 mg/dL — ABNORMAL HIGH (ref 0.61–1.24)
Glucose, Bld: 77 mg/dL (ref 70–99)
HCT: 29 % — ABNORMAL LOW (ref 39.0–52.0)
Hemoglobin: 9.9 g/dL — ABNORMAL LOW (ref 13.0–17.0)
Potassium: 5.4 mmol/L — ABNORMAL HIGH (ref 3.5–5.1)
Sodium: 140 mmol/L (ref 135–145)
TCO2: 24 mmol/L (ref 22–32)

## 2018-12-02 LAB — CK: Total CK: 128 U/L (ref 49–397)

## 2018-12-02 LAB — LIPASE, BLOOD: Lipase: 46 U/L (ref 11–51)

## 2018-12-02 LAB — MAGNESIUM: Magnesium: 2.2 mg/dL (ref 1.7–2.4)

## 2018-12-02 MED ORDER — SODIUM CHLORIDE 0.9 % IV BOLUS
1000.0000 mL | Freq: Once | INTRAVENOUS | Status: AC
  Administered 2018-12-03: 1000 mL via INTRAVENOUS

## 2018-12-02 NOTE — ED Notes (Signed)
Pt back from radiology 

## 2018-12-02 NOTE — ED Notes (Signed)
Pt transported to XR.  

## 2018-12-02 NOTE — ED Provider Notes (Signed)
[facility] DEPT Provider Note   CSN: [id number] Arrival date & time: 12/02/18  2211     History   Chief Complaint Chief Complaint  Patient presents with   hyperkalemia    HPI Manuel SHAUT Sr. is a 73 y.o. male with history of diabetes mellitus type 2, CKD stage III, peripheral vascular disease, bleeding ulcer, dementia, HTN, HLD, leukemia, pancreatitis, rhabdomyolysis, aortic valve defect, mitral valve replacement, multiple lacunar infarcts, and pernicious anemia who presents to the emergency department from [facility] with concern for hyperkalemia.  The patient blood work performed earlier today that demonstrated a potassium of 6.6 so he was sent to the ER for further evaluation.  Spoke with Margreta Journey, [facility] supervisor, who was not able to provide any additional information on when the patient was sent to the ER. Spoke with Dr. Mora Bellman, [facility] on-call physician, who does not have any additional information on why the patient was sent to the ER.  The patient is endorsing neck pain.  No other complaints at this time.  Level 5 caveat secondary to dementia.     The history is provided by the nursing home, the patient and the EMS personnel. The history is limited by the condition of the patient. No language interpreter was used.    Past Medical History:  Diagnosis Date   Anemia    Diabetes ([place])    GERD (gastroesophageal reflux disease)    Hyperlipidemia    Hypertension    Leukemia ([place])     Patient Active Problem List   Diagnosis Date Noted   SIRS (systemic inflammatory response syndrome) ([place]) 04/03/2018   Acute lower UTI 04/03/2018   Acute kidney injury superimposed on CKD ([place]) 04/03/2018   Acute pancreatitis without infection or necrosis    Hypothermia 04/10/2017   AKI (acute kidney injury) ([place]) 04/10/2017   Hyperkalemia [id number]   Acute metabolic encephalopathy [id number]   Dementia ([place])  04/10/2017   Sepsis ([place]) 04/10/2017   HCAP (healthcare-associated pneumonia) 04/10/2017   Rhabdomyolysis 11/14/2016   Multiple lacunar infarcts 11/06/2016   Small vessel disease, cerebrovascular 11/06/2016   Syncope 09/13/2016   Dermatitis 03/01/2016   Anemia, pernicious 07/04/2015   Hip pain 07/04/2015   Aortic valve sclerosis 02/19/2015   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   DM type 2 with diabetic peripheral neuropathy ([place]) 02/19/2015   Essential (primary) hypertension 02/19/2015   DM type 2 with diabetic mixed hyperlipidemia ([place]) 02/19/2015   Diabetic peripheral neuropathy associated with type 2 diabetes mellitus ([place]) 02/19/2015   Bleeding ulcer 02/19/2015   Peripheral vascular disease ([place]) 02/19/2015   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   Encounter for screening for malignant neoplasm of prostate 02/19/2015   Type 2 diabetes mellitus with stage 3 chronic kidney disease ([place]) 02/19/2015   Compulsive tobacco user syndrome 02/19/2015   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   Aortic valve defect 12/22/2012   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[place]) 11/13/2011    Past Surgical History:  Procedure Laterality Date   CATARACT EXTRACTION W/PHACO Left 09/13/2014   Procedure: CATARACT EXTRACTION PHACO AND INTRAOCULAR LENS PLACEMENT (IOC);  Surgeon: Birder Robson, MD;  Location: [facility] ORS;  Service: Ophthalmology;  Laterality: Left;  [place] 01:03    COLONOSCOPY  2014   POPLITEAL ARTERY ANGIOPLASTY Left 2014   PTCA Right    leg   UPPER GASTROINTESTINAL ENDOSCOPY  2014        Home Medications    Prior to Admission medications  Medication Sig Start Date End Date Taking? Authorizing Provider  acetaminophen (TYLENOL) 500 MG tablet Take 500 mg by mouth every 4 (four) hours as needed for mild pain, fever or headache. NOT TO EXCEED 2000 MG IN 24 HOURS   Yes [provider]  alum & mag  hydroxide-simeth (MINTOX) [id number] MG/5ML suspension Take 30 mLs by mouth every 6 (six) hours as needed for indigestion or heartburn. NOT TO EXCEED 4 DOSES IN 24 HOURS   Yes [provider]  amLODipine (NORVASC) 10 MG tablet Take 1 tablet (10 mg total) by mouth daily. 04/15/17  Yes [name], MD  aspirin EC 81 MG tablet Take 81 mg by mouth daily.   Yes [provider]  atorvastatin (LIPITOR) 10 MG tablet Take 10 mg by mouth at bedtime.    Yes [provider]  gabapentin (NEURONTIN) 300 MG capsule TAKE 1 CAPSULE BY MOUTH 3  TIMES DAILY Patient taking differently: Take 300 mg by mouth 3 (three) times daily.  08/30/16  Yes [name], MD  guaiFENesin (ROBITUSSIN) 100 MG/5ML liquid Take 100 mg by mouth 3 (three) times daily as needed for cough.    Yes [provider]  hydrALAZINE (APRESOLINE) 50 MG tablet Take 1.5 tablets (75 mg total) by mouth every 8 (eight) hours. 04/15/17  Yes [name], MD  insulin glargine (LANTUS) 100 UNIT/ML injection Inject 0.12 mLs (12 Units total) into the skin daily. 04/16/17  Yes [name], MD  insulin lispro (HUMALOG KWIKPEN) 100 UNIT/ML KiwkPen Inject 3 Units into the skin 3 (three) times daily. Hold if meal time BS is 100 or less. Call MD if BS greater than 450   Yes [provider]  loperamide (IMODIUM) 2 MG capsule Take 2 mg by mouth as needed for diarrhea or loose stools. DO NOT EXCEED 8 DOSES IN 24 HOURS   Yes [provider]  magnesium hydroxide (MILK OF MAGNESIA) 400 MG/5ML suspension Take 30 mLs by mouth at bedtime as needed for mild constipation or moderate constipation.   Yes [provider]  Multiple Vitamin (MULTIVITAMIN WITH MINERALS) TABS tablet Take 1 tablet by mouth daily.   Yes [provider]  PARoxetine (PAXIL) 40 MG tablet Take 40 mg by mouth every morning.   Yes [provider]  polyethylene glycol (MIRALAX / GLYCOLAX) packet Take 17 g by mouth  daily as needed for moderate constipation. 03/26/18  Yes Mesner, Corene Cornea, MD  QUEtiapine (SEROQUEL) 25 MG tablet Take 1 tablet (25 mg total) by mouth at bedtime. Patient taking differently: Take 25 mg by mouth 2 (two) times daily.  04/15/17  Yes [name], MD  Skin Protectants, Misc. (DIMETHICONE-ZINC OXIDE) cream Apply topically as needed (Apply 1/4 inch to buttocks/peri area with incontinence care/bathing).   Yes [provider]  tamsulosin (FLOMAX) 0.4 MG CAPS capsule Take 0.4 mg by mouth daily.   Yes [provider]  valACYclovir (VALTREX) 500 MG tablet Take 500 mg by mouth daily.   Yes [provider]  amoxicillin-clavulanate (AUGMENTIN) 875-125 MG tablet Take 1 tablet by mouth every 12 (twelve) hours for 7 days. 12/03/18 12/10/18  Tarica Harl A, PA-C  docusate sodium (COLACE) 100 MG capsule Take 1 capsule (100 mg total) by mouth every 12 (twelve) hours. 12/03/18   Jeffery Gammell A, PA-C  glucose blood test strip Use as instructed 03/06/15   [name], MD  Lancets 28G MISC 1 each by Does not apply route daily. 03/06/15  [name], MD    Family History Family History  Problem Relation Age of Onset   Diabetes Mother    Diabetes Father     Social History Social History   Tobacco Use   Smoking status: Current Every Day Smoker    Packs/day: 0.50    Types: Cigarettes   Smokeless tobacco: Never Used  Substance Use Topics   Alcohol use: No    Alcohol/week: 0.0 standard drinks   Drug use: No     Allergies   Patient has no known allergies.   Review of Systems Review of Systems  Unable to perform ROS: Dementia  Musculoskeletal: Positive for neck pain.     Physical Exam Updated Vital Signs BP (!) 141/59 (BP Location: Left Arm)    Pulse 63    Temp (!) 97.3 F (36.3 C) (Oral)    Resp 13    SpO2 94%   Physical Exam Vitals signs and nursing note reviewed.  Constitutional:      Appearance: He is well-developed.  HENT:      Head: Normocephalic.  Eyes:     Conjunctiva/sclera: Conjunctivae normal.  Neck:     Musculoskeletal: Neck supple.     Comments: Decreased range of motion of the neck. Cardiovascular:     Rate and Rhythm: Normal rate and regular rhythm.     Heart sounds: Murmur present. No friction rub. No gallop.   Pulmonary:     Effort: Pulmonary effort is normal.     Comments: Rhonchorous breath sounds throughout Abdominal:     General: Bowel sounds are normal. There is distension.     Palpations: Abdomen is soft. There is no mass.     Tenderness: There is abdominal tenderness. There is no right CVA tenderness, left CVA tenderness, guarding or rebound.     Hernia: No hernia is present.     Comments: Abdomen is moderately distended, but soft.  Discomfort with palpation of the bilateral lower abdomen.  Skin:    General: Skin is warm and dry.  Neurological:     Mental Status: He is alert.     Comments: Oriented to name.  Thinks the year is 2004.  When asked the president's name, he repeats 2004 over and over.  He knows that he lives in [place].  Psychiatric:        Behavior: Behavior normal.      ED Treatments / Results  Labs (all labs ordered are listed, but only abnormal results are displayed) Labs Reviewed  CBC WITH DIFFERENTIAL/PLATELET - Abnormal; Notable for the following components:      Result Value   RBC 2.77 (*)    Hemoglobin 8.8 (*)    HCT 29.2 (*)    MCV 105.4 (*)    RDW 18.6 (*)    All other components within normal limits  COMPREHENSIVE METABOLIC PANEL - Abnormal; Notable for the following components:   Potassium 5.4 (*)    BUN 46 (*)    Creatinine, Ser 2.08 (*)    GFR calc non Af Amer 31 (*)    GFR calc Af Amer 36 (*)    All other components within normal limits  URINALYSIS, ROUTINE W REFLEX MICROSCOPIC - Abnormal; Notable for the following components:   APPearance HAZY (*)    Protein, ur 100 (*)    Leukocytes,Ua LARGE (*)    WBC, UA >50 (*)    Bacteria, UA  FEW (*)    All other components within normal limits  I-STAT CHEM 8, ED - Abnormal; Notable for the following components:   Potassium 5.4 (*)    BUN 43 (*)    Creatinine, Ser 2.10 (*)    Hemoglobin 9.9 (*)    HCT 29.0 (*)    All other components within normal limits  URINE CULTURE  CK  MAGNESIUM  LIPASE, BLOOD    EKG EKG Interpretation  Date/Time:  Wednesday December 02 2018 22:38:35 EDT Ventricular Rate:  58 PR Interval:    QRS Duration: 137 QT Interval:  458 QTC Calculation: 450 R Axis:   30 Text Interpretation:  Sinus rhythm Probable left atrial enlargement Right bundle branch block No significant change since last tracing Confirmed by Pryor Curia [phone]) on 12/03/2018 12:19:43 AM   Radiology Ct Abdomen Pelvis Wo Contrast  Result Date: 12/03/2018 CLINICAL DATA:  Abdominal distension, history of leukemia, unable to elevate arms EXAM: CT ABDOMEN AND PELVIS WITHOUT CONTRAST TECHNIQUE: Multidetector CT imaging of the abdomen and pelvis was performed following the standard protocol without IV contrast. COMPARISON:  Most recent comparisons CT 03/26/2018 FINDINGS: Patient unable to elevate arms for examination, resulting streak artifact is present in the upper abdomen. Lower chest: Redemonstration of the extensive areas of scarring and traction bronchiectasis in the lung bases. There is a stable 8 mm subpleural nodule in the right middle lobe additional 5 mm subpleural nodule along the minor fissure is incompletely evaluated. There is cardiomegaly with biatrial enlargement. Extensive mitral annular calcifications and aortic leaflet calcifications are again seen. Atherosclerotic calcification of the coronary arteries. No pericardial effusion. Hepatobiliary: No focal liver abnormality. No visible or contour deforming up attic lesions. The gallbladder appears slightly distended with dependently layering calcified gallstones. No significant pericholecystic inflammation or fluid. Few  calcifications are present at the level of the ampulla of Vater suggesting choledocholithiasis but without upstream biliary dilatation. Pancreas: Unremarkable. No pancreatic ductal dilatation or surrounding inflammatory changes. Spleen: Normal in size without focal abnormality. Adrenals/Urinary Tract: Nodular thickening of the adrenal glands, similar to prior studies. Stable mild bilateral nonspecific perinephric stranding, a nonspecific finding though may correlate with either age or decreased renal function. Stable appearance of a 11 mm hyperdense exophytic lesion arising from the lower pole left kidney. There is persistent mild bilateral hydronephrosis, not significantly changed from the prior exam. No obstructing urolithiasis is evident. Small nonobstructing calculus present in the lower pole right kidney. Urinary bladder is markedly distended, partially displaced by the large rectal stool ball. Stomach/Bowel: Distal esophagus, stomach and duodenal sweep are unremarkable. No bowel wall thickening or dilatation. No evidence of obstruction. A normal appendix is visualized. There is a large volume fecal material throughout the colon including large 10 x 10 x 18 cm stool ball in the rectal vault. There is mild rectosigmoid mural thickening and mucosal hyperenhancement with associated perirectal and presacral fat stranding. Circumferential anorectal insert thickening is noted as well. Vascular/Lymphatic: Atherosclerotic plaque within the normal caliber aorta. Limited evaluation the ostia in the absence of contrast media. No suspicious or enlarged lymph nodes in the included lymphatic chains. Reproductive: Penile implant in place with right lower quadrant reservoir, tubing appears intact. Coarse eccentric calcification of the prostate. Difficult to evaluate given displacement by the bladder colon. Other: No abdominopelvic free fluid or free gas. No bowel containing hernias. Musculoskeletal: Unchanged appearance of a  T12 compression fracture. No new compression deformities. Mild retrolisthesis of L5 on S1 is also unchanged. No suspicious osseous lesions. IMPRESSION: 1. Large volume fecal material throughout the colon including large 10  x 10 x 18 cm stool ball in the rectal vault, consistent with fecal impaction. There is mild rectosigmoid mural thickening and mucosal hyperenhancement with associated perirectal and presacral fat stranding, consistent with stercoral colitis. 2. Marked distention of the urinary bladder, partially displaced by the large rectal stool ball. 3. Cholelithiasis without CT evidence of acute cholecystitis. Few calcifications at the level of the ampulla of Vater suggesting choledocholithiasis but without upstream biliary dilatation. Correlate with abdominal symptoms and consider right upper quadrant ultrasound if clinically warranted. 4. Continued stability of a hyperdense exophytic renal lesion in the lower pole left kidney, likely proteinaceous or hyperdense cyst 5. Unchanged appearance of the extensive architectural distortion and bronchiectasis in the lower lobes. 6. Stable 8 mm right middle lobe pulmonary nodule. 7. Aortic Atherosclerosis (ICD10-I70.0). Electronically Signed   By: Lovena Le M.D.   On: 12/03/2018 02:48   Dg Chest 2 View  Result Date: 12/02/2018 CLINICAL DATA:  Shortness of breath. Unresponsive. EXAM: CHEST - 2 VIEW COMPARISON:  04/03/2018 FINDINGS: Low lung volumes. Mild cardiomegaly. Aortic tortuosity and atherosclerosis. Peribronchial cuffing without focal airspace disease. Mild biapical pleuroparenchymal scarring. No pleural effusion or pneumothorax. Chronic compression fracture in the lower thoracic spine. No acute osseous abnormalities. IMPRESSION: Low lung volumes with peribronchial cuffing that can be seen with bronchitis or smoking related lung disease. Mild cardiomegaly. Aortic Atherosclerosis (ICD10-I70.0). Electronically Signed   By: Keith Rake M.D.   On:  12/02/2018 23:35   Ct Cervical Spine Wo Contrast  Result Date: 12/03/2018 CLINICAL DATA:  Neck pain, initial exam. No known injury. EXAM: CT CERVICAL SPINE WITHOUT CONTRAST TECHNIQUE: Multidetector CT imaging of the cervical spine was performed without intravenous contrast. Multiplanar CT image reconstructions were also generated. COMPARISON:  CT 06/18/2017 FINDINGS: Alignment: Normal. Skull base and vertebrae: No acute fracture. No primary bone lesion or focal pathologic process. Soft tissues and spinal canal: No prevertebral edema. No acute findings. Disc levels: Multilevel degenerative disc disease with disc space narrowing and endplate spurring. Large anterior osteophytes at C5-C6 and C6-C7 on the right. Spurring causes mild bilateral neural foraminal stenosis at C6-C7. No boney canal stenosis. Mild scattered facet hypertrophy. Upper chest: Apically emphysema. Other: Dense carotid calcifications with vessel tortuosity, unchanged. IMPRESSION: Unchanged multilevel degenerative disc disease and mild facet arthropathy without acute findings. Electronically Signed   By: Keith Rake M.D.   On: 12/03/2018 02:34    Procedures Fecal disimpaction  Date/Time: 12/03/2018 6:39 AM Performed by: Joanne Gavel, PA-C Authorized by: Joanne Gavel, PA-C  Consent: The procedure was performed in an emergent situation. Patient identity confirmed: verbally with patient Local anesthesia used: no  Anesthesia: Local anesthesia used: no  Sedation: Patient sedated: no  Patient tolerance: patient tolerated the procedure well with no immediate complications    (including critical care time)  Medications Ordered in ED Medications  aerochamber Z-Stat Plus/medium (has no administration in time range)  sodium chloride 0.9 % bolus 1,000 mL (0 mLs Intravenous Stopped 12/03/18 0134)  albuterol (VENTOLIN HFA) 108 (90 Base) MCG/ACT inhaler 2 puff (2 puffs Inhalation Given 12/03/18 0243)  AeroChamber Z-Stat Plus MISC  1 each (1 each Other Given 12/03/18 0301)  cefTRIAXone (ROCEPHIN) 1 g in sodium chloride 0.9 % 100 mL IVPB (0 g Intravenous Stopped 12/03/18 0521)  sodium phosphate (FLEET) 7-19 GM/118ML enema 1 enema (1 enema Rectal Given 12/03/18 0345)     Initial Impression / Assessment and Plan / ED Course  I have reviewed the triage vital signs and the nursing  notes.  Pertinent labs & imaging results that were available during my care of the patient were reviewed by me and considered in my medical decision making (see chart for details).        73 year old male with history of diabetes mellitus type 2, CKD stage III, peripheral vascular disease, bleeding ulcer, dementia, HTN, HLD, leukemia, pancreatitis, rhabdomyolysis, aortic valve defect, mitral valve replacement, multiple lacunar infarcts, and pernicious anemia presenting to the ER with a chief complaint of hyperkalemia after having labs drawn at [facility] with potassium level of 6.6.  I have spoken with staff at [facility] who are not able to provide insight as to why the labs were drawn initially.  They are not able to provide any additional information on any new symptoms the patient has been having.  The patient has a history of dementia and his only complaint to me at this time is neck pain.  The patient was seen and independently evaluated by Dr. Leonides Schanz, attending physician.  On exam, patient has decreased range of motion of the cervical spine.  Abdomen is also distended and moderately hard to palpation.  He grimaces with palpation of the abdomen.  Will order labs, imaging of the abdomen pelvis, and of the cervical spine.  He is mildly hyperkalemic at 5.4, which was treated with IV fluids and he also had some wheezing and was given 2 puffs of an albuterol inhaler.  No peak T wave changes on EKG.  Creatinine is 2.1, but minimally changed from previous.  Labs are otherwise unremarkable.  CT of the cervical spine is unremarkable for acute findings.   CT abdomen pelvis with large volume fecal material including a large stool ball in the rectal vault, consistent with fecal impaction and stair coral colitis.  There is also market distention of the urinary bladder that is partially displaced by the large rectal stool ball.   The patient was able to void approximately 500 cc of urine, but repeat bladder scan showed greater than 450 cc in the urinary bladder.  Foley catheter was placed.  He has had a small amount of urine draining and a condom cath prior to Foley catheter placement, but I suspect this is secondary to overflow incontinence.  Manual disimpaction performed by me in conjunction with a Fleet enema and a large amount of soft stool well was passed.  Nursing staff reports that the patient also had approximately 5-6 bowel movements since enema.  Given the patient has inpatient medical care at [facility], we will discharge the patient with Augmentin and docusate with a Foley catheter in place and urology follow-up.  Return precautions given.  He remains hemodynamically stable and in no acute distress.  Safe for discharge to [facility] with outpatient follow-up.  Final Clinical Impressions(s) / ED Diagnoses   Final diagnoses:  Fecal impaction [facility])  Urinary retention  Hyperkalemia    ED Discharge Orders         Ordered    amoxicillin-clavulanate (AUGMENTIN) 875-125 MG tablet  Every 12 hours     12/03/18 0414    docusate sodium (COLACE) 100 MG capsule  Every 12 hours     12/03/18 0437           Joline Maxcy A, PA-C 12/03/18 0642    Ward, Delice Bison, DO 12/03/18 0700

## 2018-12-02 NOTE — ED Triage Notes (Signed)
Per ems: Pt coming from [facility] c/o hyperkalemia. Reports they were 6.6. Pt has no complaints. Hx of dementia. Ambulatory with assistance.    100/80 61 hr 14 rr 94 o2 92 cbg 97.3 temp

## 2018-12-03 ENCOUNTER — Emergency Department (HOSPITAL_COMMUNITY): Payer: Medicare Other

## 2018-12-03 DIAGNOSIS — K5641 Fecal impaction: Secondary | ICD-10-CM | POA: Diagnosis not present

## 2018-12-03 LAB — URINALYSIS, ROUTINE W REFLEX MICROSCOPIC
Bilirubin Urine: NEGATIVE
Glucose, UA: NEGATIVE mg/dL
Hgb urine dipstick: NEGATIVE
Ketones, ur: NEGATIVE mg/dL
Nitrite: NEGATIVE
Protein, ur: 100 mg/dL — AB
Specific Gravity, Urine: 1.011 (ref 1.005–1.030)
WBC, UA: >50 WBC/hpf — ABNORMAL HIGH (ref 0–5)
pH: 7 (ref 5.0–8.0)

## 2018-12-03 MED ORDER — AEROCHAMBER Z-STAT PLUS MISC
1.0000 | Freq: Once | Status: AC
  Administered 2018-12-03: 03:00:00 1

## 2018-12-03 MED ORDER — AEROCHAMBER Z-STAT PLUS/MEDIUM MISC
Status: AC
  Filled 2018-12-03: qty 1

## 2018-12-03 MED ORDER — ALBUTEROL SULFATE HFA 108 (90 BASE) MCG/ACT IN AERS
2.0000 | INHALATION_SPRAY | Freq: Once | RESPIRATORY_TRACT | Status: AC
  Administered 2018-12-03: 2 via RESPIRATORY_TRACT
  Filled 2018-12-03: qty 6.7

## 2018-12-03 MED ORDER — AMOXICILLIN-POT CLAVULANATE 875-125 MG PO TABS: 1.0000 | ORAL_TABLET | Freq: Two times a day (BID) | ORAL | 0 refills | Status: AC

## 2018-12-03 MED ORDER — DEXTROSE 5 % IV SOLN: Freq: Once | INTRAVENOUS | Status: DC

## 2018-12-03 MED ORDER — SODIUM CHLORIDE 0.9 % IV SOLN
1.0000 g | Freq: Once | INTRAVENOUS | Status: AC
  Administered 2018-12-03: 04:00:00 1 g via INTRAVENOUS
  Filled 2018-12-03: qty 10

## 2018-12-03 MED ORDER — DOCUSATE SODIUM 100 MG PO CAPS: 100.0000 mg | ORAL_CAPSULE | Freq: Two times a day (BID) | ORAL | 0 refills | Status: DC

## 2018-12-03 MED ORDER — FLEET ENEMA 7-19 GM/118ML RE ENEM
1.0000 | ENEMA | Freq: Once | RECTAL | Status: AC
  Administered 2018-12-03: 1 via RECTAL
  Filled 2018-12-03: qty 1

## 2018-12-03 NOTE — ED Notes (Signed)
Pt back from radiology 

## 2018-12-03 NOTE — ED Notes (Signed)
Pt has not voided at this time. Will re-check condom catheter for urine

## 2018-12-03 NOTE — Discharge Instructions (Addendum)
Thank you for allowing me to care for you today in the Emergency Department.   Imaging of the abdomen and pelvis demonstrated stercoral colitis.  You were manually disimpacted in the ER today with a large amount of stool removal.  Take 1 tablet of docusate 2 times daily for constipation.  Take 1 tablet of Augmentin by mouth 2 times daily for the next week for stercoral colitis.  This should also help to treat the UTI that you have developed.  Your urine culture is pending.  Your bladder was significantly distended due to the fecal impaction.  The Foley catheter will need to remain in place until you have been seen and evaluated by Urology.  Return to the emergency department if you develop high fevers, chills, vomiting, if you stop passing flatus, or other new, concerning symptoms.  Your potassium was elevated but was treated with IV fluids in the ER.  Please have your potassium rechecked within the next few days.  Please speak with your primary care team regarding starting a bowel regimen for constipation.

## 2018-12-03 NOTE — ED Notes (Addendum)
PTAR at bedside. Pt cleaned up prior to discharge. Pt has had a total of 5-6 bowel movements since enema. Most recent being a liquid consistency.

## 2018-12-03 NOTE — ED Notes (Signed)
PT[facility] contacted and paperwork printed  

## 2018-12-03 NOTE — ED Notes (Signed)
Staff attempted In and out but was unsuccessful due to meeting resistance. Condom cath applied.

## 2018-12-03 NOTE — ED Notes (Signed)
In and out cath attempted by Judson Roch, RN and [place], RN but unsuccessful attempts. Condom cath placed and will continue to check for urine

## 2018-12-03 NOTE — ED Notes (Signed)
Urine and culture sent to lab  

## 2018-12-03 NOTE — ED Provider Notes (Signed)
Medical screening examination/treatment/procedure(s) were conducted as a shared visit with non-physician practitioner(s) and myself.  I personally evaluated the patient during the encounter.  EKG Interpretation  Date/Time:  Wednesday December 02 2018 22:38:35 EDT Ventricular Rate:  58 PR Interval:    QRS Duration: 137 QT Interval:  458 QTC Calculation: 450 R Axis:   30 Text Interpretation:  Sinus rhythm Probable left atrial enlargement Right bundle branch block No significant change since last tracing Confirmed by Pryor Curia [phone]) on 12/03/2018 12:19:43 AM   Patient is a 73 year old male sent here from [facility] with potassium of 6.6.  Found to have potassium here a 5.4 with known history of chronic kidney disease.  Will give IV fluids.  EKG shows no interval changes.  He does have a distended abdomen with diffuse discomfort on exam without guarding or rebound.  CT of the abdomen pelvis ordered shows distended bladder, fecal impaction and colitis.  He has not had any vomiting here.  No fevers.  Does not appear to be in distress.  Will place Foley catheter.  Patient has been disimpacted.  Will discharge back to nursing facility on antibiotics.   Charlise Giovanetti, Delice Bison, DO 12/03/18 [phone]

## 2018-12-05 LAB — URINE CULTURE: Culture: >=100000 — AB

## 2018-12-06 ENCOUNTER — Telehealth: Payer: Self-pay

## 2018-12-06 NOTE — Telephone Encounter (Signed)
Post ED Visit - Positive Culture Follow-up  Culture report reviewed by antimicrobial stewardship pharmacist: [place] Team []  Elenor Quinones, Pharm.D. []  Heide Guile, Pharm.D., BCPS AQ-ID []  Parks Neptune, Pharm.D., BCPS []  Alycia Rossetti, Pharm.D., BCPS []  [place], [place].D., BCPS, AAHIVP []  Legrand Como, Pharm.D., BCPS, AAHIVP []  Salome Arnt, PharmD, BCPS []  Johnnette Gourd, PharmD, BCPS []  Hughes Better, PharmD, BCPS []  Leeroy Cha, PharmD []  Laqueta Linden, PharmD, BCPS []  Albertina Parr, PharmD  [place] Team [x]  Leodis Sias, PharmD []  Lindell Spar, PharmD []  Royetta Asal, PharmD []  Graylin Shiver, Rph []  Rema Fendt) Glennon Mac, PharmD []  Arlyn Dunning, PharmD []  Netta Cedars, PharmD []  Dia Sitter, PharmD []  Leone Haven, PharmD []  Gretta Arab, PharmD []  Theodis Shove, PharmD []  Peggyann Juba, PharmD []  Reuel Boom, PharmD   Positive urine culture Treated with Amoxicillin, organism sensitive to the same and no further patient follow-up is required at this time.  Genia Del 12/06/2018, 7:31 AM

## 2019-03-16 ENCOUNTER — Emergency Department (HOSPITAL_COMMUNITY): Payer: Medicare Other

## 2019-03-16 ENCOUNTER — Emergency Department (HOSPITAL_COMMUNITY)
Admission: EM | Admit: 2019-03-16 | Discharge: 2019-03-16 | Disposition: A | Discharge status: Home / Self Care | Payer: Medicare Other | Attending: Emergency Medicine | Admitting: Emergency Medicine

## 2019-03-16 DIAGNOSIS — Z7982 Long term (current) use of aspirin: Secondary | ICD-10-CM | POA: Diagnosis not present

## 2019-03-16 DIAGNOSIS — J189 Pneumonia, unspecified organism: Secondary | ICD-10-CM | POA: Diagnosis not present

## 2019-03-16 DIAGNOSIS — Z856 Personal history of leukemia: Secondary | ICD-10-CM | POA: Insufficient documentation

## 2019-03-16 DIAGNOSIS — I10 Essential (primary) hypertension: Secondary | ICD-10-CM | POA: Insufficient documentation

## 2019-03-16 DIAGNOSIS — Z79899 Other long term (current) drug therapy: Secondary | ICD-10-CM | POA: Diagnosis not present

## 2019-03-16 DIAGNOSIS — R4182 Altered mental status, unspecified: Secondary | ICD-10-CM | POA: Insufficient documentation

## 2019-03-16 DIAGNOSIS — E119 Type 2 diabetes mellitus without complications: Secondary | ICD-10-CM | POA: Insufficient documentation

## 2019-03-16 DIAGNOSIS — Z794 Long term (current) use of insulin: Secondary | ICD-10-CM | POA: Insufficient documentation

## 2019-03-16 DIAGNOSIS — K5900 Constipation, unspecified: Secondary | ICD-10-CM

## 2019-03-16 DIAGNOSIS — F17210 Nicotine dependence, cigarettes, uncomplicated: Secondary | ICD-10-CM | POA: Insufficient documentation

## 2019-03-16 DIAGNOSIS — F0390 Unspecified dementia without behavioral disturbance: Secondary | ICD-10-CM | POA: Diagnosis not present

## 2019-03-16 DIAGNOSIS — N390 Urinary tract infection, site not specified: Secondary | ICD-10-CM

## 2019-03-16 DIAGNOSIS — Z20828 Contact with and (suspected) exposure to other viral communicable diseases: Secondary | ICD-10-CM | POA: Diagnosis not present

## 2019-03-16 LAB — URINALYSIS, ROUTINE W REFLEX MICROSCOPIC
Bilirubin Urine: NEGATIVE
Glucose, UA: NEGATIVE mg/dL
Hgb urine dipstick: NEGATIVE
Ketones, ur: NEGATIVE mg/dL
Nitrite: POSITIVE — AB
Protein, ur: 100 mg/dL — AB
Specific Gravity, Urine: 1.013 (ref 1.005–1.030)
pH: 5 (ref 5.0–8.0)

## 2019-03-16 LAB — COMPREHENSIVE METABOLIC PANEL
ALT: 13 U/L (ref 0–44)
AST: 18 U/L (ref 15–41)
Albumin: 3.9 g/dL (ref 3.5–5.0)
Alkaline Phosphatase: 77 U/L (ref 38–126)
Anion gap: 7 (ref 5–15)
BUN: 39 mg/dL — ABNORMAL HIGH (ref 8–23)
CO2: 25 mmol/L (ref 22–32)
Calcium: 9 mg/dL (ref 8.9–10.3)
Chloride: 109 mmol/L (ref 98–111)
Creatinine, Ser: 1.67 mg/dL — ABNORMAL HIGH (ref 0.61–1.24)
GFR calc Af Amer: 47 mL/min — ABNORMAL LOW (ref >60)
GFR calc non Af Amer: 40 mL/min — ABNORMAL LOW (ref >60)
Glucose, Bld: 116 mg/dL — ABNORMAL HIGH (ref 70–99)
Potassium: 5.2 mmol/L — ABNORMAL HIGH (ref 3.5–5.1)
Sodium: 141 mmol/L (ref 135–145)
Total Bilirubin: 0.6 mg/dL (ref 0.3–1.2)
Total Protein: 7.2 g/dL (ref 6.5–8.1)

## 2019-03-16 LAB — CBC WITH DIFFERENTIAL/PLATELET
Abs Immature Granulocytes: 0.02 10*3/uL (ref 0.00–0.07)
Basophils Absolute: 0.1 10*3/uL (ref 0.0–0.1)
Basophils Relative: 1 %
Eosinophils Absolute: 0.2 10*3/uL (ref 0.0–0.5)
Eosinophils Relative: 2 %
HCT: 29.8 % — ABNORMAL LOW (ref 39.0–52.0)
Hemoglobin: 9 g/dL — ABNORMAL LOW (ref 13.0–17.0)
Immature Granulocytes: 0 %
Lymphocytes Relative: 16 %
Lymphs Abs: 1.3 10*3/uL (ref 0.7–4.0)
MCH: 32.6 pg (ref 26.0–34.0)
MCHC: 30.2 g/dL (ref 30.0–36.0)
MCV: 108 fL — ABNORMAL HIGH (ref 80.0–100.0)
Monocytes Absolute: 0.7 10*3/uL (ref 0.1–1.0)
Monocytes Relative: 9 %
Neutro Abs: 5.5 10*3/uL (ref 1.7–7.7)
Neutrophils Relative %: 72 %
Platelets: 193 10*3/uL (ref 150–400)
RBC: 2.76 MIL/uL — ABNORMAL LOW (ref 4.22–5.81)
RDW: 15.3 % (ref 11.5–15.5)
WBC: 7.7 10*3/uL (ref 4.0–10.5)
nRBC: 0 % (ref 0.0–0.2)

## 2019-03-16 LAB — LIPASE, BLOOD: Lipase: 83 U/L — ABNORMAL HIGH (ref 11–51)

## 2019-03-16 LAB — LACTIC ACID, PLASMA: Lactic Acid, Venous: 1.3 mmol/L (ref 0.5–1.9)

## 2019-03-16 MED ORDER — MORPHINE SULFATE (PF) 4 MG/ML IV SOLN
4.0000 mg | Freq: Once | INTRAVENOUS | Status: AC
  Administered 2019-03-16: 4 mg via INTRAVENOUS
  Filled 2019-03-16: qty 1

## 2019-03-16 MED ORDER — SODIUM CHLORIDE 0.9 % IV SOLN: INTRAVENOUS | Status: DC

## 2019-03-16 MED ORDER — IOHEXOL 9 MG/ML PO SOLN
ORAL | Status: AC
  Administered 2019-03-16: 500 mL
  Filled 2019-03-16: qty 500

## 2019-03-16 MED ORDER — SENNOSIDES-DOCUSATE SODIUM 8.6-50 MG PO TABS: 1.0000 | ORAL_TABLET | Freq: Every day | ORAL | 0 refills | Status: AC

## 2019-03-16 MED ORDER — CEPHALEXIN 500 MG PO CAPS: 500.0000 mg | ORAL_CAPSULE | Freq: Four times a day (QID) | ORAL | 0 refills | Status: AC

## 2019-03-16 MED ORDER — FENTANYL CITRATE (PF) 100 MCG/2ML IJ SOLN
25.0000 ug | Freq: Once | INTRAMUSCULAR | Status: AC
  Administered 2019-03-16: 08:00:00 25 ug via INTRAVENOUS
  Filled 2019-03-16: qty 2

## 2019-03-16 MED ORDER — FENTANYL CITRATE (PF) 100 MCG/2ML IJ SOLN
25.0000 ug | Freq: Once | INTRAMUSCULAR | Status: AC
  Administered 2019-03-16: 25 ug via INTRAVENOUS
  Filled 2019-03-16: qty 2

## 2019-03-16 MED ORDER — SODIUM CHLORIDE 0.9 % IV SOLN
1.0000 g | Freq: Once | INTRAVENOUS | Status: AC
  Administered 2019-03-16: 1 g via INTRAVENOUS
  Filled 2019-03-16: qty 10

## 2019-03-16 MED ORDER — ONDANSETRON HCL 4 MG/2ML IJ SOLN
4.0000 mg | Freq: Once | INTRAMUSCULAR | Status: AC
  Administered 2019-03-16: 4 mg via INTRAVENOUS
  Filled 2019-03-16: qty 2

## 2019-03-16 MED ORDER — DOXYCYCLINE HYCLATE 100 MG PO CAPS: 100.0000 mg | ORAL_CAPSULE | Freq: Two times a day (BID) | ORAL | 0 refills | Status: AC

## 2019-03-16 MED ORDER — IOHEXOL 9 MG/ML PO SOLN: 500.0000 mL | ORAL | Status: AC

## 2019-03-16 MED ORDER — POLYETHYLENE GLYCOL 3350 17 G PO PACK: 17.0000 g | PACK | Freq: Two times a day (BID) | ORAL | 0 refills | Status: AC

## 2019-03-16 NOTE — Discharge Instructions (Signed)
Recommend both MiraLAX and Peri-Colace for the constipation.  Recommend taking the antibiotic for the possible pneumonia as well as the urinary tract infection.  Please follow-up closely with your primary doctor and have a repeat chest x-ray performed to re-evaluate the findings on today's chest x-ray.  If he develops fever, difficulty in breathing, other new concerning symptom, recommend return to ER for reassessment.  The Covid test should result tomorrow morning.  Should follow isolation precautions for patient until this has resulted.

## 2019-03-16 NOTE — ED Notes (Signed)
Daughter for this patient Manuel Randolph would like an update on her father, [phone].

## 2019-03-16 NOTE — ED Notes (Signed)
Pt found to have removed condom cath. Pt found soiled in urine. Pt provided with peri care and given new linens. Pt dressed from waist up but will dress pt from the waist down once PTAR arrives, incase pt has another incontinent episode. Pt has call bell within reach and given warm blankets.

## 2019-03-16 NOTE — ED Triage Notes (Signed)
Transported by [facility] from [facility]-- staff states that patient has been more altered than usual. AAO x 2. +tremors, right flank pain and increased lethargy. VSS. CBG 121 mg/dl.

## 2019-03-16 NOTE — ED Notes (Signed)
NT entered room to find IV removed from pt's arm. NT checked to see if the pt was actively bleeding, and the pt was not. RN made aware.

## 2019-03-16 NOTE — ED Provider Notes (Signed)
[facility] DEPT Provider Note   CSN: [id number] Arrival date & time: 03/16/19  [id number]     History Chief Complaint  Patient presents with   Altered Mental Status    Manuel TESSMANN Sr. is a 73 y.o. male.  Patient is a 73 year old male with a history of dementia, diabetes, hypertension, leukemia, GI bleeding with anemia, chronic kidney disease who currently lives at a nursing facility and is presenting today with report of altered mental status and abdominal pain.  Patient is complaining of right side pain but is not able to give much more history.  Nursing home told EMS that he has seemed more altered than usual had right flank pain and increased lethargy.  It is unclear how long this has been going on.  Patient denies any shortness of breath or chest pain.  He does state he hurts all over.  Unclear if he has had any emesis or difficulty urinating.  The history is provided by the patient and the nursing home. The history is limited by the absence of a caregiver.  Altered Mental Status      Past Medical History:  Diagnosis Date   Anemia    Diabetes ([place])    GERD (gastroesophageal reflux disease)    Hyperlipidemia    Hypertension    Leukemia ([place])     Patient Active Problem List   Diagnosis Date Noted   SIRS (systemic inflammatory response syndrome) ([place]) 04/03/2018   Acute lower UTI 04/03/2018   Acute kidney injury superimposed on CKD ([place]) 04/03/2018   Acute pancreatitis without infection or necrosis    Hypothermia 04/10/2017   AKI (acute kidney injury) ([place]) 04/10/2017   Hyperkalemia [id number]   Acute metabolic encephalopathy [id number]   Dementia ([place]) 04/10/2017   Sepsis ([place]) 04/10/2017   HCAP (healthcare-associated pneumonia) 04/10/2017   Rhabdomyolysis 11/14/2016   Multiple lacunar infarcts 11/06/2016   Small vessel disease, cerebrovascular 11/06/2016   Syncope 09/13/2016   Dermatitis 03/01/2016   Anemia,  pernicious 07/04/2015   Hip pain 07/04/2015   Aortic valve sclerosis 02/19/2015   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   DM type 2 with diabetic peripheral neuropathy ([place]) 02/19/2015   Essential (primary) hypertension 02/19/2015   DM type 2 with diabetic mixed hyperlipidemia ([place]) 02/19/2015   Diabetic peripheral neuropathy associated with type 2 diabetes mellitus ([place]) 02/19/2015   Bleeding ulcer 02/19/2015   Peripheral vascular disease ([place]) 02/19/2015   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   Encounter for screening for malignant neoplasm of prostate 02/19/2015   Type 2 diabetes mellitus with stage 3 chronic kidney disease ([place]) 02/19/2015   Compulsive tobacco user syndrome 02/19/2015   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   Aortic valve defect 12/22/2012   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[place]) 11/13/2011    Past Surgical History:  Procedure Laterality Date   CATARACT EXTRACTION W/PHACO Left 09/13/2014   Procedure: CATARACT EXTRACTION PHACO AND INTRAOCULAR LENS PLACEMENT (IOC);  Surgeon: Birder Robson, MD;  Location: [facility] ORS;  Service: Ophthalmology;  Laterality: Left;  [place] 01:03    COLONOSCOPY  2014   POPLITEAL ARTERY ANGIOPLASTY Left 2014   PTCA Right    leg   UPPER GASTROINTESTINAL ENDOSCOPY  2014       Family History  Problem Relation Age of Onset   Diabetes Mother    Diabetes Father     Social History   Tobacco Use   Smoking status: Current Every Day Smoker    Packs/day: 0.50  Types: Cigarettes   Smokeless tobacco: Never Used  Substance Use Topics   Alcohol use: No    Alcohol/week: 0.0 standard drinks   Drug use: No    Home Medications Prior to Admission medications   Medication Sig Start Date End Date Taking? Authorizing Provider  acetaminophen (TYLENOL) 500 MG tablet Take 500 mg by mouth every 4 (four) hours as needed for mild pain, fever or headache. NOT TO EXCEED  2000 MG IN 24 HOURS    [provider]  alum & mag hydroxide-simeth (MINTOX) [id number] MG/5ML suspension Take 30 mLs by mouth every 6 (six) hours as needed for indigestion or heartburn. NOT TO EXCEED 4 DOSES IN 24 HOURS    [provider]  amLODipine (NORVASC) 10 MG tablet Take 1 tablet (10 mg total) by mouth daily. 04/15/17   [name], MD  aspirin EC 81 MG tablet Take 81 mg by mouth daily.    [provider]  atorvastatin (LIPITOR) 10 MG tablet Take 10 mg by mouth at bedtime.     [provider]  docusate sodium (COLACE) 100 MG capsule Take 1 capsule (100 mg total) by mouth every 12 (twelve) hours. 12/03/18   McDonald, Mia A, PA-C  gabapentin (NEURONTIN) 300 MG capsule TAKE 1 CAPSULE BY MOUTH 3  TIMES DAILY Patient taking differently: Take 300 mg by mouth 3 (three) times daily.  08/30/16   [name], MD  glucose blood test strip Use as instructed 03/06/15   [name], MD  guaiFENesin (ROBITUSSIN) 100 MG/5ML liquid Take 100 mg by mouth 3 (three) times daily as needed for cough.     [provider]  hydrALAZINE (APRESOLINE) 50 MG tablet Take 1.5 tablets (75 mg total) by mouth every 8 (eight) hours. 04/15/17   [name], MD  insulin glargine (LANTUS) 100 UNIT/ML injection Inject 0.12 mLs (12 Units total) into the skin daily. 04/16/17   [name], MD  insulin lispro (HUMALOG KWIKPEN) 100 UNIT/ML KiwkPen Inject 3 Units into the skin 3 (three) times daily. Hold if meal time BS is 100 or less. Call MD if BS greater than 450    [provider]  Lancets 28G MISC 1 each by Does not apply route daily. 03/06/15   [name], MD  loperamide (IMODIUM) 2 MG capsule Take 2 mg by mouth as needed for diarrhea or loose stools. DO NOT EXCEED 8 DOSES IN 24 HOURS    [provider]  magnesium hydroxide (MILK OF MAGNESIA) 400 MG/5ML suspension Take 30 mLs by mouth at bedtime as needed for mild constipation or moderate  constipation.    [provider]  Multiple Vitamin (MULTIVITAMIN WITH MINERALS) TABS tablet Take 1 tablet by mouth daily.    [provider]  PARoxetine (PAXIL) 40 MG tablet Take 40 mg by mouth every morning.    [provider]  polyethylene glycol (MIRALAX / GLYCOLAX) packet Take 17 g by mouth daily as needed for moderate constipation. 03/26/18   Mesner, Corene Cornea, MD  QUEtiapine (SEROQUEL) 25 MG tablet Take 1 tablet (25 mg total) by mouth at bedtime. Patient taking differently: Take 25 mg by mouth 2 (two) times daily.  04/15/17   [name], MD  Skin Protectants, Misc. (DIMETHICONE-ZINC OXIDE) cream Apply topically as needed (Apply 1/4 inch to buttocks/peri area with incontinence care/bathing).    [provider]  tamsulosin (FLOMAX) 0.4 MG CAPS capsule Take 0.4 mg by mouth daily.    [provider]  valACYclovir (VALTREX) 500 MG tablet Take 500 mg by mouth daily.    [provider]    Allergies    Patient has no known allergies.  Review of Systems   Review of Systems  Unable to perform ROS: Dementia    Physical Exam Updated Vital Signs BP (!) 171/68 (BP Location: Right Arm)    Pulse (!) 55    Temp (!) 97.5 F (36.4 C) (Rectal)    Resp 19    SpO2 95%   Physical Exam Vitals and nursing note reviewed.  Constitutional:      General: He is not in acute distress.    Appearance: Normal appearance. He is well-developed and normal weight.  HENT:     Head: Normocephalic and atraumatic.     Mouth/Throat:     Mouth: Mucous membranes are dry.  Eyes:     Conjunctiva/sclera: Conjunctivae normal.     Pupils: Pupils are equal, round, and reactive to light.  Cardiovascular:     Rate and Rhythm: Normal rate and regular rhythm.     Pulses: Normal pulses.     Heart sounds: Murmur present. Systolic murmur present with a grade of 3/6.  Pulmonary:     Effort: Pulmonary effort is normal. No respiratory distress.     Breath sounds: Normal  breath sounds. No wheezing or rales.  Abdominal:     General: There is no distension.     Palpations: Abdomen is soft.     Tenderness: There is abdominal tenderness in the right lower quadrant. There is right CVA tenderness and guarding. There is no rebound. Negative signs include Murphy's sign.  Genitourinary:    Comments: Soft brown stool at fingertip no notable fecal impaction Musculoskeletal:        General: No tenderness. Normal range of motion.     Cervical back: Normal range of motion and neck supple.     Right lower leg: No edema.     Left lower leg: No edema.  Skin:    General: Skin is warm and dry.     Findings: No erythema or rash.  Neurological:     Mental Status: He is alert.     Comments: Oriented to person.  Contracture in the right upper extremity.  Patient is mildly rigid diffusely.  Is able to move all 4 extremities  Psychiatric:     Comments: Calm and cooperative     ED Results / Procedures / Treatments   Labs (all labs ordered are listed, but only abnormal results are displayed) Labs Reviewed  CBC WITH DIFFERENTIAL/PLATELET - Abnormal; Notable for the following components:      Result Value   RBC 2.76 (*)    Hemoglobin 9.0 (*)    HCT 29.8 (*)    MCV 108.0 (*)    All other components within normal limits  COMPREHENSIVE METABOLIC PANEL - Abnormal; Notable for the following components:   Potassium 5.2 (*)    Glucose, Bld 116 (*)    BUN 39 (*)    Creatinine, Ser 1.67 (*)    GFR calc non Af Amer 40 (*)    GFR calc Af Amer 47 (*)    All other components within normal limits  LIPASE, BLOOD - Abnormal; Notable for the following components:   Lipase 83 (*)    All other components within normal limits  URINALYSIS, ROUTINE W REFLEX MICROSCOPIC - Abnormal; Notable for the following components:   Protein, ur 100 (*)  Nitrite POSITIVE (*)    Leukocytes,Ua MODERATE (*)    Bacteria, UA FEW (*)    All other components within normal limits  URINE CULTURE    LACTIC ACID, PLASMA    EKG EKG Interpretation  Date/Time:  Tuesday March 16 2019 07:55:42 EST Ventricular Rate:  55 PR Interval:    QRS Duration: 139 QT Interval:  480 QTC Calculation: 460 R Axis:   63 Text Interpretation: Sinus rhythm Probable left atrial enlargement Right bundle branch block No significant change since last tracing Confirmed by Blanchie Dessert [phone]) on 03/16/2019 8:13:59 AM   Radiology CT ABDOMEN PELVIS WO CONTRAST  Result Date: 03/16/2019 CLINICAL DATA:  Abdominal distension, type II diabetes mellitus, hypertension, smoker EXAM: CT ABDOMEN AND PELVIS WITHOUT CONTRAST TECHNIQUE: Multidetector CT imaging of the abdomen and pelvis was performed following the standard protocol without IV contrast. Sagittal and coronal MPR images reconstructed from axial data set. Dilute oral contrast administered. COMPARISON:  12/03/2018 FINDINGS: Lower chest: Bibasilar small pleural effusions, atelectasis, and infiltrates. Peribronchial thickening. Hepatobiliary: Dependent calculi within gallbladder. Liver and gallbladder otherwise normal appearance Pancreas: Unremarkable Spleen: Normal appearance Adrenals/Urinary Tract: Adrenal glands unremarkable. Hyperdense nodule posterior aspect of inferior LEFT kidney 11 mm diameter unchanged. Kidneys and ureters otherwise unremarkable. Significant bladder wall thickening, bladder decompressed versus prior study when it extended above umbilicus. Stomach/Bowel: Increased stool in rectum. Normal appendix. Question anorectal wall thickening versus artifact from underdistention. Stomach and bowel loops otherwise normal appearance. Vascular/Lymphatic: Atherosclerotic calcifications aorta and iliac arteries without aneurysm. Scattered visceral artery calcifications as well as coronary arterial calcification. No adenopathy. Reproductive: Unremarkable prostate gland. Penile prosthesis with reservoir in RIGHT pelvis. Other: No free air or free fluid.  No  hernia. Musculoskeletal: Compression fracture of T12 vertebral body unchanged. Osseous demineralization. IMPRESSION: Increased stool in rectum with question anal rectal wall thickening versus artifact from underdistention, recommend correlation with digital rectal exam to exclude tumor. Stable hyperdense inferior LEFT renal nodule levin mm diameter. Extensive atherosclerotic disease changes. Bibasilar pulmonary infiltrates, atelectasis, peribronchial thickening, and small effusions. Chronic T12 compression fracture. Aortic Atherosclerosis (ICD10-I70.0). Electronically Signed   By: Lavonia Dana M.D.   On: 03/16/2019 12:38    Procedures Procedures (including critical care time)  Medications Ordered in ED Medications  0.9 %  sodium chloride infusion (has no administration in time range)  ondansetron (ZOFRAN) injection 4 mg (has no administration in time range)  fentaNYL (SUBLIMAZE) injection 25 mcg (has no administration in time range)    ED Course  I have reviewed the triage vital signs and the nursing notes.  Pertinent labs & imaging results that were available during my care of the patient were reviewed by me and considered in my medical decision making (see chart for details).  Clinical Course as of Mar 15 2130  Tue Mar 16, 2019  1610 Received signout from Dr. Maryan Rued, disposition pending chest x-ray, reassessment, likely discharge with antibiotics and bowel regimen   [RD]  1900 Rechecked patient, discussed findings with patient's daughter, plan for dc back to facility   [RD]    Clinical Course User Index [RD] Lucrezia Starch, MD   MDM Rules/Calculators/A&P                      Elderly male with multiple medical problems presenting today from nursing facility with change in mental status and flank pain.  Patient does appear to have right flank pain and right-sided abdominal pain on exam however cannot give further details.  In the past he has had fecal impaction, urinary  obstruction and colitis however today he does not have significant suprapubic pain and bladder scan with a moderate amount of urine but lower suspicion for obstruction at this time.  On exam patient does not have fecal impaction today.  Vital signs are reassuring and breath sounds are clear.  Lower suspicion for lung or cardiac etiology.  Concern for UTI versus kidney stone versus hepatitis versus appendicitis.  Stool is brown and there is no gross blood with lower suspicion for GI bleed at this time.  Patient is currently afebrile and lower suspicion for sepsis at this time.  Labs and imaging pending.  Patient given pain control.  2:56 PM CT with increased stool, bladder thickening and possible infiltrate on the lungs.  Patient's UA with concern for UTI with positive nitrites and leukocytes with 21-50 white cells.  CBC with stable hemoglobin and lipase mildly elevated at 83.  Lactate within normal limits.  Patient continues to have pain on reevaluation and difficult to move him around it seems to be localized to the right flank.  He does not have significant right upper quadrant pain to suggest cholecystitis and CT shows a dependent gallstone but no gallbladder wall thickening.  Patient was given a dose of Rocephin for concern for UTI.  He was given more pain medicine so a chest x-ray can be done.  Final Clinical Impression(s) / ED Diagnoses Final diagnoses:  None    Rx / DC Orders ED Discharge Orders    None       Blanchie Dessert, MD 03/16/19 2132

## 2019-03-16 NOTE — ED Notes (Signed)
Unsuccessful with the in and out so I put a condom cath on the patient MD made aware.

## 2019-03-16 NOTE — ED Notes (Signed)
Pt's brief clean before leaving with PTAR. Pt dressed as well before leaving. No other belongings at bedside with pt.

## 2019-03-16 NOTE — ED Notes (Signed)
Ptar contacted. 

## 2019-03-16 NOTE — ED Provider Notes (Signed)
  Physical Exam  BP (!) 171/68 (BP Location: Right Arm)   Pulse (!) 55   Temp (!) 97.5 F (36.4 C) (Rectal)   Resp 19   SpO2 95%   Physical Exam  ED Course/Procedures   Clinical Course as of Mar 15 2001  Tue Mar 16, 2019  1610 Received signout from Dr. Maryan Rued, disposition pending chest x-ray, reassessment, likely discharge with antibiotics and bowel regimen   [RD]  1900 Rechecked patient, discussed findings with patient's daughter, plan for dc back to facility   [RD]    Clinical Course User Index [RD] Lucrezia Starch, MD    Procedures  MDM  73 year old male presents to ER with right-sided abdominal pain.  Work-up including labs, CT scan concerning for UTI, constipation, questionable infiltrate in chest.  CXR ordered and pending.  \  Chest x-ray concerning for possible pneumonia.  Patient remains well-appearing on reassessment with stable vital signs, no tachypnea, no hypoxia.  Believe he is appropriate for discharge and outpatient management at this time.  Will check for COVID-19 and recommend isolation precautions until this has resulted.  Will cover with course of cephalexin and doxycycline for his UTI and possible pneumonia.  Recommend recheck with primary doctor.   Lucrezia Starch, MD 03/16/19 2003

## 2019-03-16 NOTE — ED Notes (Signed)
Patient transported to CT 

## 2019-03-17 LAB — SARS CORONAVIRUS 2 (TAT 6-24 HRS): SARS Coronavirus 2: NEGATIVE

## 2019-03-19 LAB — URINE CULTURE: Culture: >=100000 — AB

## 2019-03-20 ENCOUNTER — Telehealth: Payer: Self-pay | Admitting: Emergency Medicine

## 2019-03-20 NOTE — Progress Notes (Signed)
ED Antimicrobial Stewardship Positive Culture Follow Up   Manuel Randolph. is an 73 y.o. male who presented to [facility] on 03/16/2019 with a chief complaint of  Chief Complaint  Patient presents with  . Altered Mental Status    Recent Results (from the past 720 hour(s))  Urine culture     Status: Abnormal   Collection Time: 03/16/19 12:32 PM   Specimen: Urine, Catheterized  Result Value Ref Range Status   Specimen Description   Final    URINE, CATHETERIZED Performed at [facility] [address], [place], [place] [address]    Special Requests   Final    NONE Performed at [facility], [place] [address] [place], [place] [address]    Culture (A)  Final    >=100,000 COLONIES/mL ENTEROCOCCUS FAECALIS Two isolates with different morphologies were identified as the same organism.The most resistant organism was reported. Performed at [facility], [place] [address], [place], [place] [address]    Report Status 03/19/2019 FINAL  Final   Organism ID, Bacteria ENTEROCOCCUS FAECALIS (A)  Final      Susceptibility   Enterococcus faecalis - MIC*    AMPICILLIN <=2 SENSITIVE Sensitive     NITROFURANTOIN <=16 SENSITIVE Sensitive     VANCOMYCIN 1 SENSITIVE Sensitive     * >=100,000 COLONIES/mL ENTEROCOCCUS FAECALIS  SARS CORONAVIRUS 2 (TAT 6-24 HRS) Nasopharyngeal Nasopharyngeal Swab     Status: None   Collection Time: 03/16/19  6:58 PM   Specimen: Nasopharyngeal Swab  Result Value Ref Range Status   SARS Coronavirus 2 NEGATIVE NEGATIVE Final    Comment: (NOTE) SARS-CoV-2 target nucleic acids are NOT DETECTED. The SARS-CoV-2 RNA is generally detectable in upper and lower respiratory specimens during the acute phase of infection. Negative results do not preclude SARS-CoV-2 infection, do not rule out co-infections with other pathogens, and should not be used as the sole basis for treatment or other patient management decisions. Negative  results must be combined with clinical observations, patient history, and epidemiological information. The expected result is Negative. Fact Sheet for Patients: SugarRoll.be Fact Sheet for Healthcare Providers: https://www.woods-mathews.com/ This test is not yet approved or cleared by the [place] FDA and  has been authorized for detection and/or diagnosis of SARS-CoV-2 by FDA under an Emergency Use Authorization (EUA). This EUA will remain  in effect (meaning this test can be used) for the duration of the COVID-19 declaration under Section 56 4(b)(1) of the Act, 21 U.S.C. section 360bbb-3(b)(1), unless the authorization is terminated or revoked sooner. Performed at [facility], [place] [address] [place], [place] [address]     [x]  Treated with cephalexin, organism resistant to prescribed antimicrobial []  Patient discharged originally without antimicrobial agent and treatment is now indicated  New antibiotic prescription: Amoxicillin 500mg  PO every 8 hours for 5 days ED Provider: Irena Cords, PA   Netta Cedars, PharmD, BCPS 03/20/2019, 1:37 PM Clinical Pharmacist [phone]

## 2019-03-20 NOTE — Telephone Encounter (Signed)
Post ED Visit - Positive Culture Follow-up: Successful Patient Follow-Up  Culture assessed and recommendations reviewed by:  []  Elenor Quinones, Pharm.D. []  Heide Guile, Pharm.D., BCPS AQ-ID []  [place]ptune, Pharm.D., BCPS []  Alycia Rossetti, Pharm.D., BCPS []  [place], Pharm.D., BCPS, AAHIVP []  Legrand Como, Pharm.D., BCPS, AAHIVP []  Salome Arnt, PharmD, BCPS []  Johnnette Gourd, PharmD, BCPS []  Hughes Better, PharmD, BCPS []  Leeroy Cha, PharmD  Positive urine culture  []  Patient discharged without antimicrobial prescription and treatment is now indicated [x]  Organism is resistant to prescribed ED discharge antimicrobial []  Patient with positive blood cultures  Changes discussed with ED provider: Irena Cords PA New antibiotic prescription stop keflex, start amoxicillin 500mg  po q 8 hours x 5 days   Attempting to contact [facility] no answer    Hazle Nordmann 03/20/2019, 3:39 PM

## 2019-05-04 ENCOUNTER — Emergency Department (HOSPITAL_COMMUNITY): Payer: Medicare Other

## 2019-05-04 ENCOUNTER — Other Ambulatory Visit: Payer: Self-pay

## 2019-05-04 ENCOUNTER — Emergency Department (HOSPITAL_COMMUNITY)
Admission: EM | Admit: 2019-05-04 | Discharge: 2019-05-04 | Disposition: A | Discharge status: Skilled Nursing Facility | Payer: Medicare Other | Attending: Emergency Medicine | Admitting: Emergency Medicine

## 2019-05-04 DIAGNOSIS — Z7984 Long term (current) use of oral hypoglycemic drugs: Secondary | ICD-10-CM | POA: Diagnosis not present

## 2019-05-04 DIAGNOSIS — R0902 Hypoxemia: Secondary | ICD-10-CM | POA: Diagnosis present

## 2019-05-04 DIAGNOSIS — U071 COVID-19: Secondary | ICD-10-CM | POA: Diagnosis not present

## 2019-05-04 DIAGNOSIS — F17210 Nicotine dependence, cigarettes, uncomplicated: Secondary | ICD-10-CM | POA: Diagnosis not present

## 2019-05-04 DIAGNOSIS — E1140 Type 2 diabetes mellitus with diabetic neuropathy, unspecified: Secondary | ICD-10-CM | POA: Insufficient documentation

## 2019-05-04 DIAGNOSIS — I10 Essential (primary) hypertension: Secondary | ICD-10-CM | POA: Diagnosis not present

## 2019-05-04 DIAGNOSIS — R0602 Shortness of breath: Secondary | ICD-10-CM | POA: Diagnosis not present

## 2019-05-04 LAB — URINALYSIS, ROUTINE W REFLEX MICROSCOPIC
Bilirubin Urine: NEGATIVE
Glucose, UA: NEGATIVE mg/dL
Hgb urine dipstick: NEGATIVE
Ketones, ur: NEGATIVE mg/dL
Leukocytes,Ua: NEGATIVE
Nitrite: NEGATIVE
Protein, ur: 100 mg/dL — AB
Specific Gravity, Urine: 1.017 (ref 1.005–1.030)
pH: 5 (ref 5.0–8.0)

## 2019-05-04 LAB — BASIC METABOLIC PANEL
Anion gap: 10 (ref 5–15)
BUN: 45 mg/dL — ABNORMAL HIGH (ref 8–23)
CO2: 20 mmol/L — ABNORMAL LOW (ref 22–32)
Calcium: 8.7 mg/dL — ABNORMAL LOW (ref 8.9–10.3)
Chloride: 116 mmol/L — ABNORMAL HIGH (ref 98–111)
Creatinine, Ser: 2.33 mg/dL — ABNORMAL HIGH (ref 0.61–1.24)
GFR calc Af Amer: 31 mL/min — ABNORMAL LOW (ref >60)
GFR calc non Af Amer: 27 mL/min — ABNORMAL LOW (ref >60)
Glucose, Bld: 162 mg/dL — ABNORMAL HIGH (ref 70–99)
Potassium: 4.9 mmol/L (ref 3.5–5.1)
Sodium: 146 mmol/L — ABNORMAL HIGH (ref 135–145)

## 2019-05-04 LAB — TROPONIN I (HIGH SENSITIVITY): Troponin I (High Sensitivity): 17 ng/L (ref <18)

## 2019-05-04 LAB — CBC WITH DIFFERENTIAL/PLATELET
Abs Immature Granulocytes: 0.04 10*3/uL (ref 0.00–0.07)
Basophils Absolute: 0 10*3/uL (ref 0.0–0.1)
Basophils Relative: 0 %
Eosinophils Absolute: 0.1 10*3/uL (ref 0.0–0.5)
Eosinophils Relative: 1 %
HCT: 33.5 % — ABNORMAL LOW (ref 39.0–52.0)
Hemoglobin: 9.9 g/dL — ABNORMAL LOW (ref 13.0–17.0)
Immature Granulocytes: 1 %
Lymphocytes Relative: 13 %
Lymphs Abs: 1.1 10*3/uL (ref 0.7–4.0)
MCH: 32 pg (ref 26.0–34.0)
MCHC: 29.6 g/dL — ABNORMAL LOW (ref 30.0–36.0)
MCV: 108.4 fL — ABNORMAL HIGH (ref 80.0–100.0)
Monocytes Absolute: 0.7 10*3/uL (ref 0.1–1.0)
Monocytes Relative: 9 %
Neutro Abs: 6.5 10*3/uL (ref 1.7–7.7)
Neutrophils Relative %: 76 %
Platelets: 184 10*3/uL (ref 150–400)
RBC: 3.09 MIL/uL — ABNORMAL LOW (ref 4.22–5.81)
RDW: 14.8 % (ref 11.5–15.5)
WBC: 8.4 10*3/uL (ref 4.0–10.5)
nRBC: 0 % (ref 0.0–0.2)

## 2019-05-04 LAB — BRAIN NATRIURETIC PEPTIDE: B Natriuretic Peptide: 94.7 pg/mL (ref 0.0–100.0)

## 2019-05-04 NOTE — ED Notes (Signed)
When RN and NT were going to put on a condom cath, RN noted the penis to have an implant and patient's penis was "erect". The shaft has multiple spots where the skin is opened and green discharge, redness and white noted between the tip and shaft.  MD made aware

## 2019-05-04 NOTE — ED Notes (Signed)
RN attempted to obtain IV x2 without success. Needed bloodwork obtained but no IV access at this time

## 2019-05-04 NOTE — ED Provider Notes (Signed)
Emergency Department Provider Note   I have reviewed the triage vital signs and the nursing notes.   HISTORY  Chief Complaint Low Oxygen   HPI Manuel GAMARRA Sr. is a 74 y.o. male with PMH of GERD, DM, HLD, HTN, and Dementia presents to the ED from [facility] with report of acute onset respiratory distress with oxygen saturation of 68% on room air.  EMS was called and arrived on scene where they found the patient in no acute distress with room air oxygen saturation 95%.  He reportedly tested positive for COVID-19 on 04/28/2019 and has been experiencing congestion type symptoms with no other complications to date.   Level 5 caveat: Dementia   Past Medical History:  Diagnosis Date  . Anemia   . Diabetes ([place])   . GERD (gastroesophageal reflux disease)   . Hyperlipidemia   . Hypertension   . Leukemia [facility])     Patient Active Problem List   Diagnosis Date Noted  . SIRS (systemic inflammatory response syndrome) ([place]) 04/03/2018  . Acute lower UTI 04/03/2018  . Acute kidney injury superimposed on CKD ([place]) 04/03/2018  . Acute pancreatitis without infection or necrosis   . Hypothermia 04/10/2017  . AKI (acute kidney injury) ([place]) 04/10/2017  . Hyperkalemia 04/10/2017  . Acute metabolic encephalopathy [id number]  . Dementia ([place]) 04/10/2017  . Sepsis ([place]) 04/10/2017  . HCAP (healthcare-associated pneumonia) 04/10/2017  . Rhabdomyolysis 11/14/2016  . Multiple lacunar infarcts 11/06/2016  . Small vessel disease, cerebrovascular 11/06/2016  . Syncope 09/13/2016  . Dermatitis 03/01/2016  . Anemia, pernicious 07/04/2015  . Hip pain 07/04/2015  . Aortic valve sclerosis 02/19/2015  . Chronic lymphocytic leukemia ([place]) 02/19/2015  . DM type 2 with diabetic peripheral neuropathy ([place]) 02/19/2015  . Essential (primary) hypertension 02/19/2015  . DM type 2 with diabetic mixed hyperlipidemia ([place]) 02/19/2015  . Diabetic peripheral neuropathy associated with type 2  diabetes mellitus ([place]) 02/19/2015  . Bleeding ulcer 02/19/2015  . Peripheral vascular disease ([place]) 02/19/2015  . 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 Encounter for screening for malignant neoplasm of prostate 02/19/2015  . Type 2 diabetes mellitus with stage 3 chronic kidney disease ([place]) 02/19/2015  . Compulsive tobacco user syndrome 02/19/2015  . 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  . Aortic valve defect 12/22/2012  . 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[place]) 11/13/2011    Past Surgical History:  Procedure Laterality Date  . CATARACT EXTRACTION W/PHACO Left 09/13/2014   Procedure: CATARACT EXTRACTION PHACO AND INTRAOCULAR LENS PLACEMENT (IOC);  Surgeon: Birder Robson, MD;  Location: [facility] ORS;  Service: Ophthalmology;  Laterality: Left;  [place] 01:03   . COLONOSCOPY  2014  . POPLITEAL ARTERY ANGIOPLASTY Left 2014  . PTCA Right    leg  . UPPER GASTROINTESTINAL ENDOSCOPY  2014    Allergies Patient has no known allergies.  Family History  Problem Relation Age of Onset  . Diabetes Mother   . Diabetes Father     Social History Social History   Tobacco Use  . Smoking status: Current Every Day Smoker    Packs/day: 0.50    Types: Cigarettes  . Smokeless tobacco: Never Used  Substance Use Topics  . Alcohol use: No    Alcohol/week: 0.0 standard drinks  . Drug use: No    Review of Systems  Level 5 caveat: Dementia  ____________________________________________   PHYSICAL EXAM:  VITAL SIGNS: Vitals:   05/04/19 1330 05/04/19 1400  BP: 139/60 (!) 148/54  Pulse:  77 77  Resp: 17 20  Temp:    SpO2: 100% 100%    Constitutional: Alert but confused. Well appearing and in no acute distress. Eyes: Conjunctivae are normal.  Head: Atraumatic. Nose: No congestion/rhinnorhea. Mouth/Throat: Mucous membranes are moist.  Neck: No stridor.   Cardiovascular: Normal rate, regular rhythm. Good peripheral circulation. Grossly  normal heart sounds.   Respiratory: Normal respiratory effort.  No retractions. Lungs CTAB. Gastrointestinal: Soft and nontender. No distention.  Genitourinary: Non-erect penis with palpable implant. Chronic appearing skin breakdown with no active infection evidence. No ischemic changes.  Musculoskeletal: No lower extremity tenderness nor edema. No gross deformities of extremities. Neurologic:  Normal speech and language. No gross focal neurologic deficits are appreciated.  Skin:  Skin is warm, dry and intact. No rash noted.   ____________________________________________   LABS (all labs ordered are listed, but only abnormal results are displayed)  Labs Reviewed  BASIC METABOLIC PANEL - Abnormal; Notable for the following components:      Result Value   Sodium 146 (*)    Chloride 116 (*)    CO2 20 (*)    Glucose, Bld 162 (*)    BUN 45 (*)    Creatinine, Ser 2.33 (*)    Calcium 8.7 (*)    GFR calc non Af Amer 27 (*)    GFR calc Af Amer 31 (*)    All other components within normal limits  CBC WITH DIFFERENTIAL/PLATELET - Abnormal; Notable for the following components:   RBC 3.09 (*)    Hemoglobin 9.9 (*)    HCT 33.5 (*)    MCV 108.4 (*)    MCHC 29.6 (*)    All other components within normal limits  URINALYSIS, ROUTINE W REFLEX MICROSCOPIC - Abnormal; Notable for the following components:   Protein, ur 100 (*)    Bacteria, UA RARE (*)    All other components within normal limits  URINE CULTURE  BRAIN NATRIURETIC PEPTIDE  TROPONIN I (HIGH SENSITIVITY)  TROPONIN I (HIGH SENSITIVITY)   ____________________________________________  EKG   EKG Interpretation  Date/Time:  Tuesday May 04 2019 12:07:41 EST Ventricular Rate:  84 PR Interval:    QRS Duration: 123 QT Interval:  395 QTC Calculation: 467 R Axis:   77 Text Interpretation: Sinus rhythm Left atrial enlargement Right bundle branch block Baseline wander in lead(s) V3 No STEMI Confirmed by Nanda Quinton [phone])  on 05/04/2019 3:13:43 PM       ____________________________________________  RADIOLOGY  DG Chest Portable 1 View  Result Date: 05/04/2019 CLINICAL DATA:  Shortness of breath EXAM: PORTABLE CHEST 1 VIEW COMPARISON:  03/16/2019 FINDINGS: The heart size and mediastinal contours are stable. Calcific aortic knob. Minimal linear streaky left basilar opacity. No pleural effusion or pneumothorax. The visualized skeletal structures are unremarkable. IMPRESSION: Minimal linear streaky left basilar opacity, likely atelectasis. Electronically Signed   By: Davina Poke D.O.   On: 05/04/2019 12:36    ____________________________________________   PROCEDURES  Procedure(s) performed:   Procedures  None  ____________________________________________   INITIAL IMPRESSION / ASSESSMENT AND PLAN / ED COURSE  Pertinent labs & imaging results that were available during my care of the patient were reviewed by me and considered in my medical decision making (see chart for details).   Patient presents to the emergency department for evaluation of increased work of breathing and hypoxemia.  At the time of EMS arrival patient was comfortable and had normal oxygen saturation on room air.  No distress.  History is  limited by the patient's underlying dementia.  Imaging and labs reviewed with no acute findings.  I discussed the case with the patient's daughter by phone.  The patient does have a penile implant which is not fully erect or causing acute issues.  She is interested in having urology evaluation as an outpatient.  Ambulatory referral was placed.  I did check a UA after discussion with her as he does have history of UTI.  This is negative.  Patient with borderline fever likely related to his known COVID-19 infection.  Continue supportive care at his facility.  Return if worse. Plan discussed with daughter who is in agreement.    ____________________________________________  FINAL CLINICAL IMPRESSION(S)  / ED DIAGNOSES  Final diagnoses:  SOB (shortness of breath)     Note:  This document was prepared using Dragon voice recognition software and may include unintentional dictation errors.  Nanda Quinton, MD, [facility] Emergency Medicine    Tiffanyann Deroo, [facility], MD 05/04/19 [phone]

## 2019-05-04 NOTE — ED Triage Notes (Signed)
Pt arrives [facility] from [facility]. Per EMS: Facility stated that the pt was in respiratory distress with a RA sat of 68%. Per EMS; when EMS arrived he was not in any distress and his RA sat was 95%. Pt had a positive COVID test on 04/20/2019.

## 2019-05-04 NOTE — ED Notes (Signed)
RN spoke to patient's daughter and updated her on plan of care. Patient's daughter requesting update when all blood work and imaging is done.

## 2019-05-04 NOTE — ED Notes (Signed)
Per facility the pateint tested positive for COVID on the 27th on January, not the 19th.

## 2019-05-04 NOTE — Discharge Instructions (Signed)
You were seen in the emergency department today with shortness of breath.  Your oxygen saturations here are normal and your chest x-ray is clear.  Please continue to manage COVID-19 symptoms at home with Tylenol and close monitoring.  Return to the emergency department with any new or suddenly worsening symptoms.  I have also placed a referral to urology after discussion with your daughter.  They should be in contact to schedule a follow-up appointment.  You can call in the next 1 to 2 days if you have not heard from them.

## 2019-05-04 NOTE — ED Notes (Signed)
Nakari Deleo, daughter, [phone] would like updates on her father.

## 2019-05-06 LAB — URINE CULTURE: Culture: >=100000 — AB

## 2019-05-07 ENCOUNTER — Telehealth: Payer: Self-pay | Admitting: Emergency Medicine

## 2019-05-07 NOTE — Telephone Encounter (Signed)
Post ED Visit - Positive Culture Follow-up: Successful Patient Follow-Up  Culture assessed and recommendations reviewed by:  []  Elenor Quinones, Pharm.D. []  Heide Guile, Pharm.D., BCPS AQ-ID []  [place], Pharm.D., BCPS []  Alycia Rossetti, Pharm.D., BCPS []  [place], Pharm.D., BCPS, AAHIVP []  Legrand Como, Pharm.D., BCPS, AAHIVP []  Salome Arnt, PharmD, BCPS []  Johnnette Gourd, PharmD, BCPS []  Hughes Better, PharmD, BCPS [x]  Reuel Boom, PharmD  Positive urine culture  [x]  Patient discharged without antimicrobial prescription and treatment is now indicated []  Organism is resistant to prescribed ED discharge antimicrobial []  Patient with positive blood cultures  Changes discussed with ED provider: Domenic Moras PA New antibiotic prescription: Amoxicillin 500 mg PO BID x 10 days   Contacted [facility] to [phone] Faxed to [place] 05/07/2019, 4:50 PM

## 2019-05-07 NOTE — Progress Notes (Addendum)
ED Antimicrobial Stewardship Positive Culture Follow Up   Manuel Randolph. is an 74 y.o. male who presented to [facility] on 05/04/2019 with a chief complaint of  Chief Complaint  Patient presents with  . Low Oxygen    Recent Results (from the past 720 hour(s))  Urine culture     Status: Abnormal   Collection Time: 05/04/19  1:10 PM   Specimen: Urine, Clean Catch  Result Value Ref Range Status   Specimen Description   Final    URINE, CLEAN CATCH Performed at [facility], [place] [address] [place], [place] [address]    Special Requests   Final    NONE Performed at [facility], [place] [address] [place],  [address]    Culture >=100,000 COLONIES/mL ENTEROCOCCUS FAECALIS (A)  Final   Report Status 05/06/2019 FINAL  Final   Organism ID, Bacteria ENTEROCOCCUS FAECALIS (A)  Final      Susceptibility   Enterococcus faecalis - MIC*    AMPICILLIN <=2 SENSITIVE Sensitive     NITROFURANTOIN <=16 SENSITIVE Sensitive     VANCOMYCIN 1 SENSITIVE Sensitive     * >=100,000 COLONIES/mL ENTEROCOCCUS FAECALIS   Suspect colonization, but given dementia, will screen for unresolved symptoms that could be UTI-related.  Call facility caregiver for symptom check  If still with dementia above baseline not resolving with treatment of hypoxia or signs of unexplained lower abdominal/pubic pain (gesturing, etc.) or caregiver otherwise concerned for active UTI, start Amoxicillin 500 mg PO BID x 10d (#20) - renally adjusted for CrCl < 30 ml/min  ED Provider: Domenic Moras, PA-C   Ludmilla Mcgillis A 05/07/2019, 2:02 PM Clinical Pharmacist [phone]

## 2019-05-20 ENCOUNTER — Other Ambulatory Visit: Payer: Self-pay

## 2019-05-20 ENCOUNTER — Emergency Department (HOSPITAL_COMMUNITY)
Admission: EM | Admit: 2019-05-20 | Discharge: 2019-05-21 | Disposition: A | Discharge status: Home / Self Care | Payer: Medicare Other | Attending: Emergency Medicine | Admitting: Emergency Medicine

## 2019-05-20 ENCOUNTER — Encounter (HOSPITAL_COMMUNITY): Payer: Self-pay

## 2019-05-20 DIAGNOSIS — Z79899 Other long term (current) drug therapy: Secondary | ICD-10-CM | POA: Diagnosis not present

## 2019-05-20 DIAGNOSIS — Z7982 Long term (current) use of aspirin: Secondary | ICD-10-CM | POA: Insufficient documentation

## 2019-05-20 DIAGNOSIS — N485 Ulcer of penis: Secondary | ICD-10-CM | POA: Insufficient documentation

## 2019-05-20 DIAGNOSIS — I10 Essential (primary) hypertension: Secondary | ICD-10-CM | POA: Diagnosis not present

## 2019-05-20 DIAGNOSIS — N289 Disorder of kidney and ureter, unspecified: Secondary | ICD-10-CM | POA: Insufficient documentation

## 2019-05-20 DIAGNOSIS — R5381 Other malaise: Secondary | ICD-10-CM | POA: Diagnosis not present

## 2019-05-20 DIAGNOSIS — E119 Type 2 diabetes mellitus without complications: Secondary | ICD-10-CM | POA: Diagnosis not present

## 2019-05-20 DIAGNOSIS — D509 Iron deficiency anemia, unspecified: Secondary | ICD-10-CM

## 2019-05-20 DIAGNOSIS — Z794 Long term (current) use of insulin: Secondary | ICD-10-CM | POA: Diagnosis not present

## 2019-05-20 DIAGNOSIS — R680 Hypothermia, not associated with low environmental temperature: Secondary | ICD-10-CM | POA: Insufficient documentation

## 2019-05-20 DIAGNOSIS — T68XXXA Hypothermia, initial encounter: Secondary | ICD-10-CM

## 2019-05-20 DIAGNOSIS — F17210 Nicotine dependence, cigarettes, uncomplicated: Secondary | ICD-10-CM | POA: Insufficient documentation

## 2019-05-20 DIAGNOSIS — R251 Tremor, unspecified: Secondary | ICD-10-CM | POA: Diagnosis present

## 2019-05-20 LAB — BASIC METABOLIC PANEL
Anion gap: 12 (ref 5–15)
BUN: 29 mg/dL — ABNORMAL HIGH (ref 8–23)
CO2: 22 mmol/L (ref 22–32)
Calcium: 9.7 mg/dL (ref 8.9–10.3)
Chloride: 109 mmol/L (ref 98–111)
Creatinine, Ser: 1.68 mg/dL — ABNORMAL HIGH (ref 0.61–1.24)
GFR calc Af Amer: 46 mL/min — ABNORMAL LOW (ref >60)
GFR calc non Af Amer: 40 mL/min — ABNORMAL LOW (ref >60)
Glucose, Bld: 94 mg/dL (ref 70–99)
Potassium: 5.6 mmol/L — ABNORMAL HIGH (ref 3.5–5.1)
Sodium: 143 mmol/L (ref 135–145)

## 2019-05-20 LAB — URINALYSIS, ROUTINE W REFLEX MICROSCOPIC
Bacteria, UA: NONE SEEN
Bilirubin Urine: NEGATIVE
Glucose, UA: NEGATIVE mg/dL
Hgb urine dipstick: NEGATIVE
Ketones, ur: NEGATIVE mg/dL
Leukocytes,Ua: NEGATIVE
Nitrite: NEGATIVE
Protein, ur: 30 mg/dL — AB
Specific Gravity, Urine: 1.013 (ref 1.005–1.030)
pH: 6 (ref 5.0–8.0)

## 2019-05-20 LAB — CBC WITH DIFFERENTIAL/PLATELET
Abs Immature Granulocytes: 0.01 10*3/uL (ref 0.00–0.07)
Basophils Absolute: 0.1 10*3/uL (ref 0.0–0.1)
Basophils Relative: 1 %
Eosinophils Absolute: 0.1 10*3/uL (ref 0.0–0.5)
Eosinophils Relative: 2 %
HCT: 30.9 % — ABNORMAL LOW (ref 39.0–52.0)
Hemoglobin: 9.3 g/dL — ABNORMAL LOW (ref 13.0–17.0)
Immature Granulocytes: 0 %
Lymphocytes Relative: 35 %
Lymphs Abs: 1.8 10*3/uL (ref 0.7–4.0)
MCH: 31.5 pg (ref 26.0–34.0)
MCHC: 30.1 g/dL (ref 30.0–36.0)
MCV: 104.7 fL — ABNORMAL HIGH (ref 80.0–100.0)
Monocytes Absolute: 0.5 10*3/uL (ref 0.1–1.0)
Monocytes Relative: 9 %
Neutro Abs: 2.7 10*3/uL (ref 1.7–7.7)
Neutrophils Relative %: 53 %
Platelets: 309 10*3/uL (ref 150–400)
RBC: 2.95 MIL/uL — ABNORMAL LOW (ref 4.22–5.81)
RDW: 14.3 % (ref 11.5–15.5)
WBC: 5.1 10*3/uL (ref 4.0–10.5)
nRBC: 0 % (ref 0.0–0.2)

## 2019-05-20 LAB — CBG MONITORING, ED: Glucose-Capillary: 87 mg/dL (ref 70–99)

## 2019-05-20 NOTE — ED Notes (Signed)
Patient daughter Manuel Randolph calling asking for an update on patient  Please call [phone]

## 2019-05-20 NOTE — Discharge Instructions (Addendum)
The testing today did not show signs of stroke or infections.  His body temperature was somewhat low, and we have treated that by a warming blanket.  Make sure he stays warm and covered, for the next 12 to 24 hours.  Encourage him to drink and eat regularly to improve his metabolic state.  He has a ulcer on the top of his penis that needs to be watched for progression, and kept clean with soap and water daily.  Have him see his PCP next week for checkup on kidney function, nutritional status, anemia and overall welfare.

## 2019-05-20 NOTE — ED Triage Notes (Signed)
Pt bib ems, per ems, the facility called out for a stroke, the pt has dementia baseline. Per ems the pt is having tremors, ems says taht the pt is just having intermittent "body jerks"   128/74 80hr 98%ra 125cbg 97.1* 16rr

## 2019-05-20 NOTE — ED Provider Notes (Signed)
[facility] EMERGENCY DEPARTMENT Provider Note   CSN: [id number] Arrival date & time: 05/20/19  1817     History Chief Complaint  Patient presents with  . tremors    Manuel Randolph. is a 74 y.o. male.  HPI Events by EMS from a facility for shaking, concerning for stroke.  He is unable to give any history.   Level 5 caveat-dementia    Past Medical History:  Diagnosis Date  . Anemia   . Diabetes ([place])   . GERD (gastroesophageal reflux disease)   . Hyperlipidemia   . Hypertension   . Leukemia [facility])     Patient Active Problem List   Diagnosis Date Noted  . SIRS (systemic inflammatory response syndrome) ([place]) 04/03/2018  . Acute lower UTI 04/03/2018  . Acute kidney injury superimposed on CKD ([place]) 04/03/2018  . Acute pancreatitis without infection or necrosis   . Hypothermia 04/10/2017  . AKI (acute kidney injury) ([place]) 04/10/2017  . Hyperkalemia 04/10/2017  . Acute metabolic encephalopathy [id number]  . Dementia ([place]) 04/10/2017  . Sepsis ([place]) 04/10/2017  . HCAP (healthcare-associated pneumonia) 04/10/2017  . Rhabdomyolysis 11/14/2016  . Multiple lacunar infarcts 11/06/2016  . Small vessel disease, cerebrovascular 11/06/2016  . Syncope 09/13/2016  . Dermatitis 03/01/2016  . Anemia, pernicious 07/04/2015  . Hip pain 07/04/2015  . Aortic valve sclerosis 02/19/2015  . Chronic lymphocytic leukemia ([place]) 02/19/2015  . DM type 2 with diabetic peripheral neuropathy ([place]) 02/19/2015  . Essential (primary) hypertension 02/19/2015  . DM type 2 with diabetic mixed hyperlipidemia ([place]) 02/19/2015  . Diabetic peripheral neuropathy associated with type 2 diabetes mellitus ([place]) 02/19/2015  . Bleeding ulcer 02/19/2015  . Peripheral vascular disease ([place]) 02/19/2015  . 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 Encounter for screening for malignant neoplasm of prostate 02/19/2015  . Type 2 diabetes mellitus with stage 3 chronic kidney  disease ([place]) 02/19/2015  . Compulsive tobacco user syndrome 02/19/2015  . 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  . Aortic valve defect 12/22/2012  . 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[place]) 11/13/2011    Past Surgical History:  Procedure Laterality Date  . CATARACT EXTRACTION W/PHACO Left 09/13/2014   Procedure: CATARACT EXTRACTION PHACO AND INTRAOCULAR LENS PLACEMENT (IOC);  Surgeon: Birder Robson, MD;  Location: [facility] ORS;  Service: Ophthalmology;  Laterality: Left;  [place] 01:03   . COLONOSCOPY  2014  . POPLITEAL ARTERY ANGIOPLASTY Left 2014  . PTCA Right    leg  . UPPER GASTROINTESTINAL ENDOSCOPY  2014       Family History  Problem Relation Age of Onset  . Diabetes Mother   . Diabetes Father     Social History   Tobacco Use  . Smoking status: Current Every Day Smoker    Packs/day: 0.50    Types: Cigarettes  . Smokeless tobacco: Never Used  Substance Use Topics  . Alcohol use: No    Alcohol/week: 0.0 standard drinks  . Drug use: No    Home Medications Prior to Admission medications   Medication Sig Start Date End Date Taking? Authorizing Provider  acetaminophen (TYLENOL) 500 MG tablet Take 500 mg by mouth every 4 (four) hours as needed for mild pain, fever or headache. NOT TO EXCEED 2000 MG IN 24 HOURS    [provider]  alum & mag hydroxide-simeth (MINTOX) [id number] MG/5ML suspension Take 30 mLs by mouth every 6 (six) hours as needed for indigestion or heartburn. NOT TO EXCEED 4 DOSES IN 24 HOURS  [provider]  amLODipine (NORVASC) 10 MG tablet Take 1 tablet (10 mg total) by mouth daily. 04/15/17   [name], MD  aspirin EC 81 MG tablet Take 81 mg by mouth daily.    [provider]  atorvastatin (LIPITOR) 10 MG tablet Take 10 mg by mouth at bedtime.     [provider]  docusate sodium (COLACE) 100 MG capsule Take 1 capsule (100 mg total) by mouth every 12 (twelve) hours. 12/03/18    McDonald, Mia A, PA-C  gabapentin (NEURONTIN) 300 MG capsule TAKE 1 CAPSULE BY MOUTH 3  TIMES DAILY Patient taking differently: Take 300 mg by mouth 3 (three) times daily.  08/30/16   [name], MD  glucose blood test strip Use as instructed 03/06/15   [name], MD  guaiFENesin (ROBITUSSIN) 100 MG/5ML liquid Take 100 mg by mouth 3 (three) times daily as needed for cough.     [provider]  hydrALAZINE (APRESOLINE) 50 MG tablet Take 1.5 tablets (75 mg total) by mouth every 8 (eight) hours. 04/15/17   [name], MD  insulin glargine (LANTUS) 100 UNIT/ML injection Inject 0.12 mLs (12 Units total) into the skin daily. 04/16/17   [name], MD  insulin lispro (HUMALOG KWIKPEN) 100 UNIT/ML KiwkPen Inject 3 Units into the skin 3 (three) times daily. Hold if meal time BS is 100 or less. Call MD if BS greater than 450    [provider]  Lancets 28G MISC 1 each by Does not apply route daily. 03/06/15   [name], MD  loperamide (IMODIUM) 2 MG capsule Take 2 mg by mouth daily as needed for diarrhea or loose stools. DO NOT EXCEED 8 DOSES IN 24 HOURS     [provider]  magnesium hydroxide (MILK OF MAGNESIA) 400 MG/5ML suspension Take 30 mLs by mouth at bedtime as needed for mild constipation or moderate constipation.    [provider]  Multiple Vitamin (MULTIVITAMIN WITH MINERALS) TABS tablet Take 1 tablet by mouth daily.    [provider]  PARoxetine (PAXIL) 40 MG tablet Take 40 mg by mouth every morning.    [provider]  polyethylene glycol (MIRALAX / GLYCOLAX) 17 g packet Take 17 g by mouth 2 (two) times daily.    [provider]  QUEtiapine (SEROQUEL) 25 MG tablet Take 1 tablet (25 mg total) by mouth at bedtime. Patient taking differently: Take 25 mg by mouth 2 (two) times daily.  04/15/17   [name], MD  senna-docusate (SENOKOT-S) 8.6-50 MG tablet Take 1 tablet by mouth daily.    [provider]  Skin Protectants, Misc. (DIMETHICONE-ZINC OXIDE) cream Apply 1 application topically See admin instructions. Apply 1/4 inch topically to Buttocks/Peri-Area with incontinence care/Nathing for skin protection    [provider]  tamsulosin (FLOMAX) 0.4 MG CAPS capsule Take 0.4 mg by mouth daily.    [provider]  valACYclovir (VALTREX) 500 MG tablet Take 500 mg by mouth daily.    [provider]    Allergies    Patient has no known allergies.  Review of Systems   Review of Systems  Unable to perform ROS: Dementia    Physical Exam Updated Vital Signs BP (!) 135/58   Pulse 63   Temp (!) 96.7 F (35.9 C) (Rectal)   Resp 10   Wt 66.6 kg   SpO2 98%   BMI 22.32 kg/m   Physical Exam Vitals and  nursing note reviewed.  Constitutional:      General: He is not in acute distress.    Appearance: He is well-developed. He is not ill-appearing, toxic-appearing or diaphoretic.  HENT:     Head: Normocephalic and atraumatic.     Right Ear: External ear normal.     Left Ear: External ear normal.  Eyes:     Conjunctiva/sclera: Conjunctivae normal.     Pupils: Pupils are equal, round, and reactive to light.  Neck:     Trachea: Phonation normal.  Cardiovascular:     Rate and Rhythm: Normal rate and regular rhythm.     Heart sounds: Normal heart sounds.  Pulmonary:     Effort: Pulmonary effort is normal.     Breath sounds: Normal breath sounds.  Abdominal:     General: There is no distension.     Palpations: Abdomen is soft.     Tenderness: There is no abdominal tenderness.  Genitourinary:    Comments: Penis with hypospadias.  On the dorsal distal penis just behind the glans there is a 1.0 x 2.0 cm flat ulceration, with some mild granulation tissue but no bleeding, drainage or associated fluctuance.  This appearance is nonspecific.  Patient is uncircumcised, without phimosis or paraphimosis.  Scrotum and scrotal contents are normal for age.  No  inguinal adenopathy. Musculoskeletal:        General: Normal range of motion.     Cervical back: Normal range of motion and neck supple.  Skin:    General: Skin is warm and dry.     Coloration: Skin is not jaundiced.  Neurological:     Mental Status: He is alert.     Cranial Nerves: No cranial nerve deficit.     Motor: No abnormal muscle tone.     Coordination: Coordination normal.     Comments: Responds to voice by opening eyes, cooperates minimally with physical exam.  No focal asymmetry of muscular strength.  Lower extremities are stiff, but he allows me to move them.  Upper extremities do not have clonus or spasticity.  Psychiatric:     Comments: Mumbles responses, unable to assess for lucidity.     ED Results / Procedures / Treatments   Labs (all labs ordered are listed, but only abnormal results are displayed) Labs Reviewed  URINALYSIS, ROUTINE W REFLEX MICROSCOPIC - Abnormal; Notable for the following components:      Result Value   Protein, ur 30 (*)    All other components within normal limits  BASIC METABOLIC PANEL - Abnormal; Notable for the following components:   Potassium 5.6 (*)    BUN 29 (*)    Creatinine, Ser 1.68 (*)    GFR calc non Af Amer 40 (*)    GFR calc Af Amer 46 (*)    All other components within normal limits  CBC WITH DIFFERENTIAL/PLATELET - Abnormal; Notable for the following components:   RBC 2.95 (*)    Hemoglobin 9.3 (*)    HCT 30.9 (*)    MCV 104.7 (*)    All other components within normal limits  CBG MONITORING, ED    EKG EKG Interpretation  Date/Time:  Thursday May 20 2019 18:35:28 EST Ventricular Rate:  71 PR Interval:    QRS Duration: 144 QT Interval:  433 QTC Calculation: 471 R Axis:   52 Text Interpretation: Sinus rhythm Probable left atrial enlargement Right bundle branch block Artifact since last tracing no significant change Confirmed by Daleen Bo [phone]) on 05/20/2019  7:50:20 PM   Radiology No results found.   Procedures Procedures (including critical care time)  Medications Ordered in ED Medications - No data to display  ED Course  I have reviewed the triage vital signs and the nursing notes.  Pertinent labs & imaging results that were available during my care of the patient were reviewed by me and considered in my medical decision making (see chart for details).  Clinical Course as of May 19 2254  Thu May 20, 2019  2049 I was able to reach the patient's daughter, Yvetta Coder.  She states that she was called by the nursing care facility because at 6 PM tonight the patient was "twitching while he was eating."  Technician was concerned about him having had a mini stroke previously so patient was sent to the emergency department.  She states that she has not seen him since onset of the pandemic, nearly 1 year ago.  She states that he normally is able to talk but he does mumble sometimes.  She thinks that he may have a urinary tract infection causing the shaking.  We discussed his reassuring findings, with reassuring vital signs.  He will be evaluated with urinalysis as a general screen.   [EW]  2249 I followed up again with the patient's daughter, to tell her about his progress and the findings on testing today.  She was appreciative and expressed no other concerns or problems.   [EW]  2250 Normal except hemoglobin 9.3, low, MCV high  CBC with Differential(!) [EW]  2251 Normal except potassium elevated, BUN high, creatinine high, GFR low  Basic metabolic panel(!) [EW]  [id number] Normal except presence of protein  Urinalysis, Routine w reflex microscopic(!) [EW]    Clinical Course User Index [EW] Daleen Bo, MD   MDM Rules/Calculators/A&P                       Patient Vitals for the past 24 hrs:  BP Temp Temp src Pulse Resp SpO2 Weight  05/20/19 2241 - (!) 96.7 F (35.9 C) Rectal - - - -  05/20/19 2156 - (!) 96.2 F (35.7 C) Rectal - - - -  05/20/19 2115 (!) 135/58 - - 63 10 98 % -  05/20/19  1930 (!) 124/48 - - 62 (!) 8 98 % -  05/20/19 1915 (!) 118/48 - - 61 (!) 8 97 % -  05/20/19 1900 (!) 115/48 - - 63 (!) 9 97 % -  05/20/19 1845 (!) 131/46 - - 68 10 97 % -  05/20/19 1840 (!) 130/47 97.7 F (36.5 C) Oral 72 10 98 % -  05/20/19 1829 - - - - - - 66.6 kg    10:56 PM Reevaluation with update and discussion. After initial assessment and treatment, an updated evaluation reveals no change in clinical status. Daleen Bo   Medical Decision Making: Tremor, nonspecific, with hypothermia, mild, improving with warming blanket.  Initial CBG was normal.  Patient was able to drink orange juice following that, drank a whole 8 ounce cup.  Screening labs, fairly reassuring.  He has mild iron deficiency anemia, and renal insufficiency which is improving compared to last week.  Incidental mild hyperkalemia likely related to blood draw after IV.  Urinalysis normal.  Patient has incidental finding of penile ulcer which was present on prior evaluation and does not appear infected at this time.  It is a nonspecific appearance and does not require intervention at this time.  Patient's daughter  was informed about it, and stated she already knew about it.Bacterial infection, metabolic instability, CVA, seizure disorder or impending vascular collapse.  Patient had Covid several weeks ago and appears to have recovered from that at this time.  [name] was evaluated in Emergency Department on 05/20/2019 for the symptoms described in the history of present illness. He was evaluated in the context of the global COVID-19 pandemic, which necessitated consideration that the patient might be at risk for infection with the SARS-CoV-2 virus that causes COVID-19. Institutional protocols and algorithms that pertain to the evaluation of patients at risk for COVID-19 are in a state of rapid change based on information released by regulatory bodies including the CDC and federal and state organizations. These policies  and algorithms were followed during the patient's care in the ED.     CRITICAL CARE-no Performed by: Daleen Bo   Nursing Notes Reviewed/ Care Coordinated Applicable Imaging Reviewed Interpretation of Laboratory Data incorporated into ED treatment  The patient appears reasonably screened and/or stabilized for discharge and I doubt any other medical condition or other [facility] requiring further screening, evaluation, or treatment in the ED at this time prior to discharge.  Plan: Home Medications-continue usual; Home Treatments-gradual advance diet and activity; return here if the recommended treatment, does not improve the symptoms; Recommended follow up-PCP, as needed    Final Clinical Impression(s) / ED Diagnoses Final diagnoses:  Malaise  Penile ulcer  Hypothermia, initial encounter  Renal insufficiency  Iron deficiency anemia, unspecified iron deficiency anemia type    Rx / DC Orders ED Discharge Orders    None       Daleen Bo, MD 05/20/19 2256

## 2019-05-20 NOTE — ED Notes (Signed)
zakea took report at [facility].

## 2019-05-20 NOTE — ED Notes (Signed)
Ulcer noted on the top of the pt's penis. MD notified.

## 2019-05-20 NOTE — ED Notes (Signed)
[facility] Called to take patient to [name]

## 2019-05-21 NOTE — ED Notes (Signed)
Pt taken back to facility via [facility]

## 2019-05-28 ENCOUNTER — Emergency Department (HOSPITAL_COMMUNITY): Payer: Medicare Other

## 2019-05-28 ENCOUNTER — Encounter (HOSPITAL_COMMUNITY): Payer: Self-pay | Admitting: Emergency Medicine

## 2019-05-28 ENCOUNTER — Other Ambulatory Visit: Payer: Self-pay

## 2019-05-28 ENCOUNTER — Inpatient Hospital Stay (HOSPITAL_COMMUNITY)
Admission: EM | Admit: 2019-05-28 | Discharge: 2019-06-03 | DRG: 871 | Disposition: A | Discharge status: Hospice | Payer: Medicare Other | Source: Skilled Nursing Facility | Attending: Family Medicine | Admitting: Family Medicine

## 2019-05-28 DIAGNOSIS — D638 Anemia in other chronic diseases classified elsewhere: Secondary | ICD-10-CM | POA: Diagnosis present

## 2019-05-28 DIAGNOSIS — Z8616 Personal history of COVID-19: Secondary | ICD-10-CM

## 2019-05-28 DIAGNOSIS — R319 Hematuria, unspecified: Secondary | ICD-10-CM

## 2019-05-28 DIAGNOSIS — Z66 Do not resuscitate: Secondary | ICD-10-CM | POA: Diagnosis present

## 2019-05-28 DIAGNOSIS — R6520 Severe sepsis without septic shock: Secondary | ICD-10-CM | POA: Diagnosis present

## 2019-05-28 DIAGNOSIS — A419 Sepsis, unspecified organism: Secondary | ICD-10-CM | POA: Diagnosis present

## 2019-05-28 DIAGNOSIS — G9341 Metabolic encephalopathy: Secondary | ICD-10-CM | POA: Diagnosis present

## 2019-05-28 DIAGNOSIS — Z8673 Personal history of transient ischemic attack (TIA), and cerebral infarction without residual deficits: Secondary | ICD-10-CM

## 2019-05-28 DIAGNOSIS — E782 Mixed hyperlipidemia: Secondary | ICD-10-CM | POA: Diagnosis present

## 2019-05-28 DIAGNOSIS — E1122 Type 2 diabetes mellitus with diabetic chronic kidney disease: Secondary | ICD-10-CM | POA: Diagnosis present

## 2019-05-28 DIAGNOSIS — E1151 Type 2 diabetes mellitus with diabetic peripheral angiopathy without gangrene: Secondary | ICD-10-CM | POA: Diagnosis present

## 2019-05-28 DIAGNOSIS — Z515 Encounter for palliative care: Secondary | ICD-10-CM | POA: Diagnosis present

## 2019-05-28 DIAGNOSIS — Z79899 Other long term (current) drug therapy: Secondary | ICD-10-CM

## 2019-05-28 DIAGNOSIS — F0390 Unspecified dementia without behavioral disturbance: Secondary | ICD-10-CM

## 2019-05-28 DIAGNOSIS — G92 Toxic encephalopathy: Secondary | ICD-10-CM | POA: Diagnosis not present

## 2019-05-28 DIAGNOSIS — N179 Acute kidney failure, unspecified: Secondary | ICD-10-CM | POA: Diagnosis present

## 2019-05-28 DIAGNOSIS — Z7982 Long term (current) use of aspirin: Secondary | ICD-10-CM | POA: Diagnosis not present

## 2019-05-28 DIAGNOSIS — R627 Adult failure to thrive: Secondary | ICD-10-CM

## 2019-05-28 DIAGNOSIS — Z833 Family history of diabetes mellitus: Secondary | ICD-10-CM

## 2019-05-28 DIAGNOSIS — F17210 Nicotine dependence, cigarettes, uncomplicated: Secondary | ICD-10-CM | POA: Diagnosis present

## 2019-05-28 DIAGNOSIS — R9401 Abnormal electroencephalogram [EEG]: Secondary | ICD-10-CM | POA: Diagnosis not present

## 2019-05-28 DIAGNOSIS — A4151 Sepsis due to Escherichia coli [E. coli]: Principal | ICD-10-CM | POA: Diagnosis present

## 2019-05-28 DIAGNOSIS — N1832 Chronic kidney disease, stage 3b: Secondary | ICD-10-CM | POA: Diagnosis present

## 2019-05-28 DIAGNOSIS — Z1612 Extended spectrum beta lactamase (ESBL) resistance: Secondary | ICD-10-CM | POA: Diagnosis present

## 2019-05-28 DIAGNOSIS — K219 Gastro-esophageal reflux disease without esophagitis: Secondary | ICD-10-CM | POA: Diagnosis present

## 2019-05-28 DIAGNOSIS — C9110 Chronic lymphocytic leukemia of B-cell type not having achieved remission: Secondary | ICD-10-CM | POA: Diagnosis present

## 2019-05-28 DIAGNOSIS — R404 Transient alteration of awareness: Secondary | ICD-10-CM

## 2019-05-28 DIAGNOSIS — N390 Urinary tract infection, site not specified: Secondary | ICD-10-CM | POA: Diagnosis present

## 2019-05-28 DIAGNOSIS — E1142 Type 2 diabetes mellitus with diabetic polyneuropathy: Secondary | ICD-10-CM | POA: Diagnosis present

## 2019-05-28 DIAGNOSIS — I129 Hypertensive chronic kidney disease with stage 1 through stage 4 chronic kidney disease, or unspecified chronic kidney disease: Secondary | ICD-10-CM | POA: Diagnosis present

## 2019-05-28 DIAGNOSIS — Z794 Long term (current) use of insulin: Secondary | ICD-10-CM

## 2019-05-28 DIAGNOSIS — E875 Hyperkalemia: Secondary | ICD-10-CM | POA: Diagnosis present

## 2019-05-28 DIAGNOSIS — R569 Unspecified convulsions: Secondary | ICD-10-CM | POA: Diagnosis not present

## 2019-05-28 DIAGNOSIS — G4089 Other seizures: Secondary | ICD-10-CM | POA: Diagnosis present

## 2019-05-28 DIAGNOSIS — D500 Iron deficiency anemia secondary to blood loss (chronic): Secondary | ICD-10-CM | POA: Diagnosis present

## 2019-05-28 DIAGNOSIS — I350 Nonrheumatic aortic (valve) stenosis: Secondary | ICD-10-CM | POA: Diagnosis present

## 2019-05-28 DIAGNOSIS — E860 Dehydration: Secondary | ICD-10-CM | POA: Diagnosis present

## 2019-05-28 LAB — COMPREHENSIVE METABOLIC PANEL
ALT: 17 U/L (ref 0–44)
AST: 21 U/L (ref 15–41)
Albumin: 3 g/dL — ABNORMAL LOW (ref 3.5–5.0)
Alkaline Phosphatase: 87 U/L (ref 38–126)
Anion gap: 11 (ref 5–15)
BUN: 50 mg/dL — ABNORMAL HIGH (ref 8–23)
CO2: 20 mmol/L — ABNORMAL LOW (ref 22–32)
Calcium: 8.7 mg/dL — ABNORMAL LOW (ref 8.9–10.3)
Chloride: 107 mmol/L (ref 98–111)
Creatinine, Ser: 2.86 mg/dL — ABNORMAL HIGH (ref 0.61–1.24)
GFR calc Af Amer: 24 mL/min — ABNORMAL LOW (ref >60)
GFR calc non Af Amer: 21 mL/min — ABNORMAL LOW (ref >60)
Glucose, Bld: 196 mg/dL — ABNORMAL HIGH (ref 70–99)
Potassium: 5.3 mmol/L — ABNORMAL HIGH (ref 3.5–5.1)
Sodium: 138 mmol/L (ref 135–145)
Total Bilirubin: 0.7 mg/dL (ref 0.3–1.2)
Total Protein: 7.3 g/dL (ref 6.5–8.1)

## 2019-05-28 LAB — URINALYSIS, ROUTINE W REFLEX MICROSCOPIC
Bilirubin Urine: NEGATIVE
Glucose, UA: NEGATIVE mg/dL
Hgb urine dipstick: NEGATIVE
Ketones, ur: NEGATIVE mg/dL
Nitrite: NEGATIVE
Protein, ur: 100 mg/dL — AB
Specific Gravity, Urine: 1.013 (ref 1.005–1.030)
WBC, UA: >50 WBC/hpf — ABNORMAL HIGH (ref 0–5)
pH: 5 (ref 5.0–8.0)

## 2019-05-28 LAB — CBC WITH DIFFERENTIAL/PLATELET
Abs Immature Granulocytes: 0.1 10*3/uL — ABNORMAL HIGH (ref 0.00–0.07)
Basophils Absolute: 0.1 10*3/uL (ref 0.0–0.1)
Basophils Relative: 1 %
Eosinophils Absolute: 0 10*3/uL (ref 0.0–0.5)
Eosinophils Relative: 0 %
HCT: 30.2 % — ABNORMAL LOW (ref 39.0–52.0)
Hemoglobin: 9.3 g/dL — ABNORMAL LOW (ref 13.0–17.0)
Immature Granulocytes: 1 %
Lymphocytes Relative: 9 %
Lymphs Abs: 1.3 10*3/uL (ref 0.7–4.0)
MCH: 32.4 pg (ref 26.0–34.0)
MCHC: 30.8 g/dL (ref 30.0–36.0)
MCV: 105.2 fL — ABNORMAL HIGH (ref 80.0–100.0)
Monocytes Absolute: 1.5 10*3/uL — ABNORMAL HIGH (ref 0.1–1.0)
Monocytes Relative: 10 %
Neutro Abs: 12.1 10*3/uL — ABNORMAL HIGH (ref 1.7–7.7)
Neutrophils Relative %: 79 %
Platelets: 192 10*3/uL (ref 150–400)
RBC: 2.87 MIL/uL — ABNORMAL LOW (ref 4.22–5.81)
RDW: 14.4 % (ref 11.5–15.5)
WBC: 15.1 10*3/uL — ABNORMAL HIGH (ref 4.0–10.5)
nRBC: 0 % (ref 0.0–0.2)

## 2019-05-28 LAB — CBG MONITORING, ED: Glucose-Capillary: 164 mg/dL — ABNORMAL HIGH (ref 70–99)

## 2019-05-28 LAB — LACTIC ACID, PLASMA: Lactic Acid, Venous: 1.5 mmol/L (ref 0.5–1.9)

## 2019-05-28 LAB — LIPID PANEL
Cholesterol: 109 mg/dL (ref 0–200)
HDL: 28 mg/dL — ABNORMAL LOW (ref >40)
LDL Cholesterol: 67 mg/dL (ref 0–99)
Total CHOL/HDL Ratio: 3.9 RATIO
Triglycerides: 70 mg/dL (ref <150)
VLDL: 14 mg/dL (ref 0–40)

## 2019-05-28 LAB — HEMOGLOBIN A1C
Hgb A1c MFr Bld: 5.2 % (ref 4.8–5.6)
Mean Plasma Glucose: 102.54 mg/dL

## 2019-05-28 LAB — SARS CORONAVIRUS 2 (TAT 6-24 HRS): SARS Coronavirus 2: POSITIVE — AB

## 2019-05-28 LAB — GLUCOSE, CAPILLARY: Glucose-Capillary: 91 mg/dL (ref 70–99)

## 2019-05-28 MED ORDER — SODIUM CHLORIDE 0.9 % IV SOLN
2.0000 g | Freq: Once | INTRAVENOUS | Status: AC
  Administered 2019-05-28: 16:00:00 2 g via INTRAVENOUS
  Filled 2019-05-28: qty 2

## 2019-05-28 MED ORDER — SODIUM CHLORIDE 0.9 % IV BOLUS
1000.0000 mL | Freq: Once | INTRAVENOUS | Status: AC
  Administered 2019-05-28: 1000 mL via INTRAVENOUS

## 2019-05-28 MED ORDER — VANCOMYCIN VARIABLE DOSE PER UNSTABLE RENAL FUNCTION (PHARMACIST DOSING): Status: DC

## 2019-05-28 MED ORDER — VANCOMYCIN HCL IN DEXTROSE 1-5 GM/200ML-% IV SOLN
1000.0000 mg | Freq: Once | INTRAVENOUS | Status: AC
  Administered 2019-05-28: 1000 mg via INTRAVENOUS
  Filled 2019-05-28: qty 200

## 2019-05-28 MED ORDER — METRONIDAZOLE IN NACL 5-0.79 MG/ML-% IV SOLN
500.0000 mg | Freq: Once | INTRAVENOUS | Status: DC
  Filled 2019-05-28: qty 100

## 2019-05-28 MED ORDER — ENOXAPARIN SODIUM 30 MG/0.3ML ~~LOC~~ SOLN
30.0000 mg | SUBCUTANEOUS | Status: DC
  Administered 2019-05-29 – 2019-05-31 (×3): 30 mg via SUBCUTANEOUS
  Filled 2019-05-28 (×3): qty 0.3

## 2019-05-28 MED ORDER — SODIUM CHLORIDE 0.9 % IV SOLN: 2.0000 g | INTRAVENOUS | Status: DC

## 2019-05-28 MED ORDER — SODIUM CHLORIDE 0.9 % IV SOLN: 2.0000 g | Freq: Once | INTRAVENOUS | Status: DC

## 2019-05-28 MED ORDER — VANCOMYCIN HCL IN DEXTROSE 1-5 GM/200ML-% IV SOLN: 1000.0000 mg | Freq: Once | INTRAVENOUS | Status: DC

## 2019-05-28 MED ORDER — SODIUM CHLORIDE 0.9 % IV BOLUS
1000.0000 mL | Freq: Once | INTRAVENOUS | Status: AC
  Administered 2019-05-28: 15:00:00 1000 mL via INTRAVENOUS

## 2019-05-28 MED ORDER — METRONIDAZOLE IN NACL 5-0.79 MG/ML-% IV SOLN
500.0000 mg | Freq: Three times a day (TID) | INTRAVENOUS | Status: DC
  Administered 2019-05-28 – 2019-05-29 (×2): 500 mg via INTRAVENOUS
  Filled 2019-05-28 (×2): qty 100

## 2019-05-28 MED ORDER — ACETAMINOPHEN 325 MG PO TABS: 650.0000 mg | ORAL_TABLET | Freq: Four times a day (QID) | ORAL | Status: DC | PRN

## 2019-05-28 MED ORDER — ACETAMINOPHEN 650 MG RE SUPP
650.0000 mg | Freq: Four times a day (QID) | RECTAL | Status: DC | PRN
  Administered 2019-05-28: 21:00:00 650 mg via RECTAL
  Filled 2019-05-28: qty 1

## 2019-05-28 MED ORDER — SODIUM CHLORIDE 0.9 % IV SOLN: INTRAVENOUS | Status: DC

## 2019-05-28 MED ORDER — INSULIN ASPART 100 UNIT/ML ~~LOC~~ SOLN: 0.0000 [IU] | Freq: Three times a day (TID) | SUBCUTANEOUS | Status: DC

## 2019-05-28 NOTE — Progress Notes (Signed)
Pt has elevated at 102.9, BP 103/51. Rapid response notified due to Mews score acute change. Dr. Chauncey Reading was paged and she called back, no new order given.

## 2019-05-28 NOTE — ED Provider Notes (Signed)
[place] EMERGENCY DEPARTMENT Provider Note   CSN: [id number] Arrival date & time: 05/28/19  1336     History No chief complaint on file.  LEVEL 5 CAVEAT - DEMENTIA  Manuel Randolph. is a 74 y.o. male who presents to the ED via EMS for altered mental status.   Per EMS called out due to altered level of consciousness and low blood pressure.  Did not obtain what his blood pressure was by EMS.  CBG on arrival 189.  His GCS 11, responding to painful stimuli. His temp is 96.8 orally, HR 76, blood pressure 121/73 without intervention by EMS, and satting 97% on RA.   History obtained by nursing staff who reports pt was "lethargic" today.  States that he checked his blood pressure and it was in the 0000000 systolic.  She reports that normally patient is awake, alert, opens his eyes and speaks in clear sentences.  She states he was not doing any of this today which prompted EMS. She was seen in the ED on 2/18 for plaint of shaking, concern for stroke at that time.  At that time his neuro exam was documented as pt responding to voice by opening eyes but cooperated minimally during physical exam. No focal deficits in terms of muscular strength. Pt was mumbling responses at that time. ED workup included EKG, U/A, BMP, CBC, CBG without acute findings. Pt was discharged back to facility.   The history is provided by the nursing home, the EMS personnel and medical records.       Past Medical History:  Diagnosis Date  . Anemia   . Diabetes ([place])   . GERD (gastroesophageal reflux disease)   . Hyperlipidemia   . Hypertension   . Leukemia [facility])     Patient Active Problem List   Diagnosis Date Noted  . SIRS (systemic inflammatory response syndrome) ([place]) 04/03/2018  . Acute lower UTI 04/03/2018  . Acute kidney injury superimposed on CKD ([place]) 04/03/2018  . Acute pancreatitis without infection or necrosis   . Hypothermia 04/10/2017  . AKI (acute kidney injury) ([place])  04/10/2017  . Hyperkalemia 04/10/2017  . Acute metabolic encephalopathy [id number]  . Dementia ([place]) 04/10/2017  . Sepsis ([place]) 04/10/2017  . HCAP (healthcare-associated pneumonia) 04/10/2017  . Rhabdomyolysis 11/14/2016  . Multiple lacunar infarcts 11/06/2016  . Small vessel disease, cerebrovascular 11/06/2016  . Syncope 09/13/2016  . Dermatitis 03/01/2016  . Anemia, pernicious 07/04/2015  . Hip pain 07/04/2015  . Aortic valve sclerosis 02/19/2015  . Chronic lymphocytic leukemia ([place]) 02/19/2015  . DM type 2 with diabetic peripheral neuropathy ([place]) 02/19/2015  . Essential (primary) hypertension 02/19/2015  . DM type 2 with diabetic mixed hyperlipidemia ([place]) 02/19/2015  . Diabetic peripheral neuropathy associated with type 2 diabetes mellitus ([place]) 02/19/2015  . Bleeding ulcer 02/19/2015  . Peripheral vascular disease ([place]) 02/19/2015  . 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 Encounter for screening for malignant neoplasm of prostate 02/19/2015  . Type 2 diabetes mellitus with stage 3 chronic kidney disease ([place]) 02/19/2015  . Compulsive tobacco user syndrome 02/19/2015  . 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  . Aortic valve defect 12/22/2012  . 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[place]) 11/13/2011    Past Surgical History:  Procedure Laterality Date  . CATARACT EXTRACTION W/PHACO Left 09/13/2014   Procedure: CATARACT EXTRACTION PHACO AND INTRAOCULAR LENS PLACEMENT (IOC);  Surgeon: Birder Robson, MD;  Location: [facility] ORS;  Service: Ophthalmology;  Laterality: Left;  [place] 01:03   .  COLONOSCOPY  2014  . POPLITEAL ARTERY ANGIOPLASTY Left 2014  . PTCA Right    leg  . UPPER GASTROINTESTINAL ENDOSCOPY  2014       Family History  Problem Relation Age of Onset  . Diabetes Mother   . Diabetes Father     Social History   Tobacco Use  . Smoking status: Current Every Day Smoker    Packs/day: 0.50    Types: Cigarettes  .  Smokeless tobacco: Never Used  Substance Use Topics  . Alcohol use: No    Alcohol/week: 0.0 standard drinks  . Drug use: No    Home Medications Prior to Admission medications   Medication Sig Start Date End Date Taking? Authorizing Provider  acetaminophen (TYLENOL) 500 MG tablet Take 500 mg by mouth every 4 (four) hours as needed for mild pain, fever or headache. NOT TO EXCEED 2000 MG IN 24 HOURS    [provider]  alum & mag hydroxide-simeth (MINTOX) [id number] MG/5ML suspension Take 30 mLs by mouth every 6 (six) hours as needed for indigestion or heartburn. NOT TO EXCEED 4 DOSES IN 24 HOURS    [provider]  amLODipine (NORVASC) 10 MG tablet Take 1 tablet (10 mg total) by mouth daily. 04/15/17   [name], MD  aspirin EC 81 MG tablet Take 81 mg by mouth daily.    [provider]  atorvastatin (LIPITOR) 10 MG tablet Take 10 mg by mouth at bedtime.     [provider]  docusate sodium (COLACE) 100 MG capsule Take 1 capsule (100 mg total) by mouth every 12 (twelve) hours. 12/03/18   McDonald, Mia A, PA-C  gabapentin (NEURONTIN) 300 MG capsule TAKE 1 CAPSULE BY MOUTH 3  TIMES DAILY Patient taking differently: Take 300 mg by mouth 3 (three) times daily.  08/30/16   [name], MD  glucose blood test strip Use as instructed 03/06/15   [name], MD  guaiFENesin (ROBITUSSIN) 100 MG/5ML liquid Take 100 mg by mouth 3 (three) times daily as needed for cough.     [provider]  hydrALAZINE (APRESOLINE) 50 MG tablet Take 1.5 tablets (75 mg total) by mouth every 8 (eight) hours. 04/15/17   [name], MD  insulin glargine (LANTUS) 100 UNIT/ML injection Inject 0.12 mLs (12 Units total) into the skin daily. 04/16/17   [name], MD  insulin lispro (HUMALOG KWIKPEN) 100 UNIT/ML KiwkPen Inject 3 Units into the skin 3 (three) times daily. Hold if meal time BS is 100 or less. Call MD if BS greater than 450    [provider]  Lancets 28G MISC 1 each by Does not apply route daily. 03/06/15   [name], MD  loperamide (IMODIUM) 2 MG capsule Take 2 mg by mouth daily as needed for diarrhea or loose stools. DO NOT EXCEED 8 DOSES IN 24 HOURS     [provider]  magnesium hydroxide (MILK OF MAGNESIA) 400 MG/5ML suspension Take 30 mLs by mouth at bedtime as needed for mild constipation or moderate constipation.    [provider]  Multiple Vitamin (MULTIVITAMIN WITH MINERALS) TABS tablet Take 1 tablet by mouth daily.    [provider]  PARoxetine (PAXIL) 40 MG tablet Take 40 mg by mouth every morning.    [provider]  polyethylene glycol (MIRALAX / GLYCOLAX) 17 g packet Take 17 g by mouth 2 (two) times daily.    [provider]  QUEtiapine (SEROQUEL)  25 MG tablet Take 1 tablet (25 mg total) by mouth at bedtime. Patient taking differently: Take 25 mg by mouth 2 (two) times daily.  04/15/17   [name], MD  senna-docusate (SENOKOT-S) 8.6-50 MG tablet Take 1 tablet by mouth daily.    [provider]  Skin Protectants, Misc. (DIMETHICONE-ZINC OXIDE) cream Apply 1 application topically See admin instructions. Apply 1/4 inch topically to Buttocks/Peri-Area with incontinence care/Nathing for skin protection    [provider]  tamsulosin (FLOMAX) 0.4 MG CAPS capsule Take 0.4 mg by mouth daily.    [provider]  valACYclovir (VALTREX) 500 MG tablet Take 500 mg by mouth daily.    [provider]    Allergies    Patient has no known allergies.  Review of Systems   Review of Systems  Unable to perform ROS: Dementia  Constitutional: Positive for fatigue.    Physical Exam Updated Vital Signs BP 121/73   Pulse 67   Temp (!) 96.8 F (36 C) (Temporal)   Resp 13   SpO2 97%   Physical Exam Vitals and nursing note reviewed.  Constitutional:      Appearance: He is not ill-appearing or diaphoretic.  HENT:      Head: Normocephalic and atraumatic.  Eyes:     Conjunctiva/sclera: Conjunctivae normal.     Pupils: Pupils are equal, round, and reactive to light.  Cardiovascular:     Rate and Rhythm: Normal rate and regular rhythm.     Pulses: Normal pulses.  Pulmonary:     Effort: Pulmonary effort is normal.     Breath sounds: Normal breath sounds. No wheezing, rhonchi or rales.  Abdominal:     Palpations: Abdomen is soft.     Tenderness: There is no abdominal tenderness.  Musculoskeletal:     Cervical back: Neck supple.  Skin:    General: Skin is warm and dry.  Neurological:     Mental Status: He is alert.     GCS: GCS eye subscore is 3. GCS verbal subscore is 2. GCS motor subscore is 5.     Comments: Responds to painful stimuli. Moving all extremities without signs of contractures.      ED Results / Procedures / Treatments   Labs (all labs ordered are listed, but only abnormal results are displayed) Labs Reviewed  COMPREHENSIVE METABOLIC PANEL - Abnormal; Notable for the following components:      Result Value   Potassium 5.3 (*)    CO2 20 (*)    Glucose, Bld 196 (*)    BUN 50 (*)    Creatinine, Ser 2.86 (*)    Calcium 8.7 (*)    Albumin 3.0 (*)    GFR calc non Af Amer 21 (*)    GFR calc Af Amer 24 (*)    All other components within normal limits  CBC WITH DIFFERENTIAL/PLATELET - Abnormal; Notable for the following components:   WBC 15.1 (*)    RBC 2.87 (*)    Hemoglobin 9.3 (*)    HCT 30.2 (*)    MCV 105.2 (*)    Neutro Abs 12.1 (*)    Monocytes Absolute 1.5 (*)    Abs Immature Granulocytes 0.10 (*)    All other components within normal limits  CBG MONITORING, ED - Abnormal; Notable for the following components:   Glucose-Capillary 164 (*)    All other components within normal limits  URINE CULTURE  CULTURE, BLOOD (ROUTINE X 2)  CULTURE, BLOOD (ROUTINE X 2)  SARS CORONAVIRUS 2 (TAT 6-24 HRS)  LACTIC ACID, PLASMA  URINALYSIS, ROUTINE W REFLEX MICROSCOPIC    EKG EKG  Interpretation  Date/Time:  Friday May 28 2019 13:41:28 EST Ventricular Rate:  62 PR Interval:    QRS Duration: 141 QT Interval:  467 QTC Calculation: 475 R Axis:   75 Text Interpretation: Sinus rhythm LAE, consider biatrial enlargement Right bundle branch block No significant change since last tracing Confirmed by Isla Pence [phone]) on 05/28/2019 3:04:12 PM   Radiology CT Head Wo Contrast  Result Date: 05/28/2019 CLINICAL DATA:  Change with an unknown cause. EXAM: CT HEAD WITHOUT CONTRAST TECHNIQUE: Contiguous axial images were obtained from the base of the skull through the vertex without intravenous contrast. COMPARISON:  April 01, 2018 FINDINGS: Brain: No evidence of acute infarction, hemorrhage, hydrocephalus, extra-axial collection or mass lesion/mass effect. Again noted are chronic microvascular ischemic changes, atrophy, and bilateral lacunar infarcts. Vascular: No hyperdense vessel or unexpected calcification. Skull: Normal. Negative for fracture or focal lesion. Sinuses/Orbits: There is mild mucosal thickening of the ethmoid air cells and bilateral maxillary sinuses. The remaining paranasal sinuses and mastoid air cells are essentially clear. Other: None. IMPRESSION: 1. No acute intracranial abnormality. 2. Chronic findings as detailed above, not substantially changed from prior study in 2020. Electronically Signed   By: Constance Holster M.D.   On: 05/28/2019 14:41   DG Chest Port 1 View  Result Date: 05/28/2019 CLINICAL DATA:  Altered mental status. EXAM: PORTABLE CHEST 1 VIEW COMPARISON:  05/04/2019 FINDINGS: 1356 hours. Low lung volumes. Cardiopericardial silhouette is at upper limits of normal for size. Interstitial markings are diffusely coarsened with chronic features. Streaky opacity at the left base is similar to prior likely chronic atelectasis or scarring. No focal airspace consolidation. The visualized bony structures of the thorax are intact. Telemetry leads  overlie the chest. IMPRESSION: Low volume film with chronic interstitial changes and left base atelectasis or scarring. Electronically Signed   By: Misty Stanley M.D.   On: 05/28/2019 14:18    Procedures .Critical Care Performed by: Eustaquio Maize, PA-C Authorized by: Eustaquio Maize, PA-C   Critical care provider statement:    Critical care time (minutes):  45   Critical care was necessary to treat or prevent imminent or life-threatening deterioration of the following conditions:  Sepsis   Critical care was time spent personally by me on the following activities:  Discussions with consultants, evaluation of patient's response to treatment, examination of patient, ordering and performing treatments and interventions, ordering and review of laboratory studies, ordering and review of radiographic studies, pulse oximetry, re-evaluation of patient's condition, obtaining history from patient or surrogate and review of old charts   (including critical care time)  Medications Ordered in ED Medications  ceFEPIme (MAXIPIME) 2 g in sodium chloride 0.9 % 100 mL IVPB (has no administration in time range)  metroNIDAZOLE (FLAGYL) IVPB 500 mg (has no administration in time range)  vancomycin (VANCOCIN) IVPB 1000 mg/200 mL premix (has no administration in time range)  sodium chloride 0.9 % bolus 1,000 mL (1,000 mLs Intravenous New Bag/Given 05/28/19 1525)    ED Course  I have reviewed the triage vital signs and the nursing notes.  Pertinent labs & imaging results that were available during my care of the patient were reviewed by me and considered in my medical decision making (see chart for details).  74 year old male history of dementia who presents to the ED via EMS with concern for altered level consciousness at skilled nursing facility.  On arrival to the ED patient's oral temp 96.8 however rectal temp 94.9.  Bear hugger applied.  He was seen in the ED on 2/18 for concern for shaking and possible  stroke and was noted to have a rectal temp of 96 at that time.  No concerning lab findings at that time and patient was discharged home.  Given he has had documented hypothermia less than a week ago do not feel patient needs work-up for sepsis at this time.  Will attempt to rewarm and obtain screening labs to work-up for possible infection but will hold off on antibiotics.   CXR without signs of infection. CT head negative for acute changes.   CBC returned with leukocytosis of 15,000. Given this and hypothermia will start on abx. Unknown source of infection at this time however suspect urine given CXR negative. Code sepsis initiated at this time. Pt will need admission. COVID swab obtained however pt was COVID positive in January (04/28/2019) however not in our system.   3:38 PM At shift change case signed out to [facility], [place], who will call for admission once urine returns.   Clinical Course as of May 27 1536  Fri May 28, 2019  1445 Temp(!): 94.9 F (34.9 C) [MV]  1533 Dementia patient, sepsis, from facility. Unknown source at this time, waiting on urine.    [KM]    Clinical Course User Index [KM] Alveria Apley, PA-C [MV] Eustaquio Maize, PA-C   MDM Rules/Calculators/A&P                      Final Clinical Impression(s) / ED Diagnoses Final diagnoses:  Sepsis with acute renal failure without septic shock, due to unspecified organism, unspecified acute renal failure type ([place])  Altered level of consciousness  Dementia without behavioral disturbance, unspecified dementia type [facility])    Rx / DC Orders ED Discharge Orders    None       Eustaquio Maize, PA-C 05/28/19 1539    Isla Pence, MD 06/03/19 1208

## 2019-05-28 NOTE — Progress Notes (Signed)
Pharmacy Antibiotic Note  Manuel Randolph. is a 74 y.o. male admitted on 05/28/2019 with AMS, now with suspected sepsis.  Pharmacy has been consulted for vancomycin and cefepime dosing. Notably, patient has grown E. Faecalis in his urine on 03/16/19 and 05/04/19 which was pan-sensitive. Consider de-escalation when appropriate.   Temp 94.9 F. WBC elevated at 15. Lactate 1.5. Scr elevated at 2.86 from baseline 1.6-2.   Plan: - Vancomycin loading dose 1g IV x1 - Cefepime 2g IV q24hr - Will monitor Scr and vancomycin levels as indicated for further vancomycin dosing due to patient's possible AKI - Consider de-escalation when appropriate    Temp (24hrs), Avg:95.9 F (35.5 C), Min:94.9 F (34.9 C), Max:96.8 F (36 C)  Recent Labs  Lab 05/28/19 1404  WBC 15.1*  LATICACIDVEN 1.5    CrCl cannot be calculated (Unknown ideal weight.).    No Known Allergies  Antimicrobials this admission: Vancomycin 2/26 >>  Cefepime 2/26 >>   Dose adjustments this admission:  Microbiology results: 2/26 BCx: sent 2/26 UCx: sent   Thank you for allowing pharmacy to be a part of this patient's care.  Agnes Lawrence, PharmD PGY1 Pharmacy Resident

## 2019-05-28 NOTE — ED Notes (Signed)
Attempted to give report; nurse still in report from day shift. Will call me back.

## 2019-05-28 NOTE — H&P (Addendum)
[facility] Admission History and Physical Service Pager: [phone]  Patient name: Manuel Randolph Medical record number: [id number] Date of birth: 02/22/1946 Age: 74 y.o. Gender: male  Primary Care Provider: System, Pcp Not In Consultants: None Code Status: Full Preferred Emergency Contact:   Chief Complaint: AMS  Assessment and Plan: Manuel Randolph. is a 74 y.o. male presenting with AMS and sepsis. PMH is significant for dementia, DMT2, HTN, [name], PVD, AVM of small intestine, CKD, h/o urinary retention, penile implant.  AMS  Sepsis, likely urosepsis 73 yo man w/ h/o dementia presented to ED from skilled nursing facility for decreased level of consciousness. Patient found to be lethargic and AMS today, baseline cognition is alert, eyes open, speaks in clear sentences. Nursing home staff stated he was not doing any of this today, which prompted EMS call. SBP noted to be in the 90s. Upon arrival to ED, GCS 11, oral temperature 96.8*F, and rectal temperature 94.9*F. Retail banker initiated. Previously on 2/18 patient presented to ED with shaking and possible stroke and was noted to have a rectal temp of 96 at that time, but lab values were all WNL.  Meets 2 out of 4 sepsis criteria and 2 out of 3 qSOFA.  Of note, patient has been admitted to the hospital for the same presentation in January 2019 and January 2020, no source found, but likely urosepsis vs pneumonia. Today, leukocytosis present to 15k. CXR w/o signs of infection. CT head negative for acute changes. COVID-19 now positive but pt tested COVID-19 positive on 04/28/19 (not in our system), received vaccines on 1/22 and 2/19, out of 21 day window and will likely continue to test COVID positive. UA positive for large leukocytes, but nitrite negative, protein 100. UA microscopy with many bacteria, WBCs >50, RBC/HPF 11-20. Previous urine cultures in December 2020 and on 05/04/19 were positive for E. Faecalis that  was pan sensitive. Urine and blood cultures pending. Treated in ED with cefepime, flagyl, and vancomycin. Based on prior urine culture, will likely be able to narrow abx tomorrow. -admit to progressive, FPTS, attending Dr. Gwendlyn Deutscher -continuous cardiac monitoring, pulse ox -bair hugger -AM EKG  -AM CBC, CMP, AM cortisol, procalcitonin  -Hgb A1c, lipid panel, PT/INR -NS IVF @ 119mL/hr -Continue cefepime, flagyl, vancomycin, narrow antibiotics as appropriate -N.p.o. -Follow-up RPR -Incentive spirometry -Strict bed rest -We will need PT/OT when pain is proved and SLP for swallow eval -Lovenox for DVT ppx  Penile Implant  H/O urinary retention  Peyronie's disease  HSV Patient with h/o of ED and peyronie's disease, had an inflatable penile prosthesis placed in 11/2012. Home medications flomax 0.4 mg, and valtrex. Patient also has a h/o HSV, currently ulcer present just proximal to the dorsal side of glans of penis. Patient has presented with likely UTI and urinary retention with sepsis and been admitted to the hospital in January 2019 and January 2020, although cultures negative. UA positive for large leukocytes, but nitrite negative, protein 100. UA microscopy with many bacteria, WBCs >50, RBC/HPF 11-20. Likely urosepsis, urine culture pending. On CTX, flagyl, and vancomycin. See above for sepsis treatment.  - RPR, HIV -Referral to urology to consider removal of penile implant after multiple UTIs and uroseptic events   DM2 with peripheral neuropathy Home medications include Insulin lispro 3U TID and hold for meal time BGL 100 or less, lantus 12 U daily. Gabapentin 300 mg TID (renal dosing). -sliding scale insulin with CBG q4h -hold home meds, gabapentin held d/t  NPO  AKI on CKD Stage 3 Baseline cr in January 2020 around 1.7, on 2/18 cr 1.68, today cr elevated to 2.86.  -Avoid nephrotoxic agents as much as possible -IVF NS @ 125 mL/hr -AM BMP  HTN Home medications include amlodipine 10mg ,  hydralazine 75 mg TID. -NPO currently, home medications held in setting of sepsis.  HLD Home medications include ASA 81 mg, atorvastatin 10 mg. -NPO, home medications held  Dementia Multiple chronic lacunar infarcts on CT head. Home medication paxil 40 mg, seroquel 25 mg qhs. -NPO currently, hold home medications -Consider palliative consult and discussion of CODE STATUS with daughter given his poor baseline mental status.  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 Baseline hgb around 9-10.  Aortic Valve stenosis Mild aortic valve stenosis initially documented on echo 12/2007.  Holosystolic murmur 2/6 auscultated.  Echo in January 2019: LVEF [id number], normal systolic function, normal wall motion, G1DD, mild stenosis noted of aortic valve (mean gradient 14 mmHg), mitral valve with calcified annulus.   [name] Currently stable. Diagnosed with Rai stage 0 [name] in 2014, flow cytometry c/w [name].    PVD H/o of ulcerations and gangrene of L foot, popliteal artery angioplasty L 2014.  Tobacco use Patient has >50 years smoking history. No CT on file.  FEN/GI: NPO Prophylaxis: Lovenox  Disposition: to progressive pending medical work up and stabilization  History of Present Illness:  Manuel Randolph. is a 74 y.o. male presenting with   Review Of Systems: Per HPI with the following additions:   History obtained from daughter.  Patient unable to provide history due to condition.  Daughter reports that patient lives at [facility] and when she spoke with staff she was told that he became more lethargic today and warranted EMS and transportation to ED for further workup. She states that he was recently tested positive for COVID 01/17 and he had received both COVID vaccines (01/22 and 02/19)  She states that at baseline patient has dementia and has intermittent confusion.     PMHx dementia, HTN, DM, Leukemia-last seen 2 yrs ago by Dr Sabino Niemann at [facility], High Cholesterol, Penile Implant approx 5 yrs ago    Review of Systems  Constitutional: Positive for fever.    Patient Active Problem List   Diagnosis Date Noted  . SIRS (systemic inflammatory response syndrome) ([place]) 04/03/2018  . Acute lower UTI 04/03/2018  . Acute kidney injury superimposed on CKD ([place]) 04/03/2018  . Acute pancreatitis without infection or necrosis   . Hypothermia 04/10/2017  . AKI (acute kidney injury) ([place]) 04/10/2017  . Hyperkalemia 04/10/2017  . Acute metabolic encephalopathy [id number]  . Dementia ([place]) 04/10/2017  . Sepsis ([place]) 04/10/2017  . HCAP (healthcare-associated pneumonia) 04/10/2017  . Rhabdomyolysis 11/14/2016  . Multiple lacunar infarcts 11/06/2016  . Small vessel disease, cerebrovascular 11/06/2016  . Syncope 09/13/2016  . Dermatitis 03/01/2016  . Anemia, pernicious 07/04/2015  . Hip pain 07/04/2015  . Aortic valve sclerosis 02/19/2015  . Chronic lymphocytic leukemia ([place]) 02/19/2015  . DM type 2 with diabetic peripheral neuropathy ([place]) 02/19/2015  . Essential (primary) hypertension 02/19/2015  . DM type 2 with diabetic mixed hyperlipidemia ([place]) 02/19/2015  . Diabetic peripheral neuropathy associated with type 2 diabetes mellitus ([place]) 02/19/2015  . Bleeding ulcer 02/19/2015  . Peripheral vascular disease ([place]) 02/19/2015  . 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 Encounter for screening for malignant  neoplasm of prostate 02/19/2015  . Type 2 diabetes mellitus with stage 3 chronic kidney disease ([place]) 02/19/2015  . Compulsive tobacco user syndrome 02/19/2015  . 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  . Aortic valve defect 12/22/2012  . 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[place]) 11/13/2011    Past Medical History: Past Medical History:  Diagnosis Date  . Anemia   . Diabetes ([place])    . GERD (gastroesophageal reflux disease)   . Hyperlipidemia   . Hypertension   . Leukemia [facility])     Past Surgical History: Past Surgical History:  Procedure Laterality Date  . CATARACT EXTRACTION W/PHACO Left 09/13/2014   Procedure: CATARACT EXTRACTION PHACO AND INTRAOCULAR LENS PLACEMENT (IOC);  Surgeon: Birder Robson, MD;  Location: [facility] ORS;  Service: Ophthalmology;  Laterality: Left;  [place] 01:03   . COLONOSCOPY  2014  . POPLITEAL ARTERY ANGIOPLASTY Left 2014  . PTCA Right    leg  . UPPER GASTROINTESTINAL ENDOSCOPY  2014    Social History: Social History   Tobacco Use  . Smoking status: Current Every Day Smoker    Packs/day: 0.50    Types: Cigarettes  . Smokeless tobacco: Never Used  Substance Use Topics  . Alcohol use: No    Alcohol/week: 0.0 standard drinks  . Drug use: No   Additional social history: Please also refer to relevant sections of EMR.  Family History: Family History  Problem Relation Age of Onset  . Diabetes Mother   . Diabetes Father     Allergies and Medications: No Known Allergies No current facility-administered medications on file prior to encounter.   Current Outpatient Medications on File Prior to Encounter  Medication Sig Dispense Refill  . acetaminophen (TYLENOL) 500 MG tablet Take 500 mg by mouth every 4 (four) hours as needed for mild pain, fever or headache. NOT TO EXCEED 2000 MG IN 24 HOURS    . alum & mag hydroxide-simeth (MINTOX) [id number] MG/5ML suspension Take 30 mLs by mouth every 6 (six) hours as needed for indigestion or heartburn. NOT TO EXCEED 4 DOSES IN 24 HOURS    . amLODipine (NORVASC) 10 MG tablet Take 1 tablet (10 mg total) by mouth daily. 30 tablet 0  . aspirin EC 81 MG tablet Take 81 mg by mouth daily.    Marland Kitchen atorvastatin (LIPITOR) 10 MG tablet Take 10 mg by mouth at bedtime.     . docusate sodium (COLACE) 100 MG capsule Take 1 capsule (100 mg total) by mouth every 12 (twelve) hours. 60 capsule 0  . gabapentin  (NEURONTIN) 300 MG capsule TAKE 1 CAPSULE BY MOUTH 3  TIMES DAILY (Patient taking differently: Take 300 mg by mouth 3 (three) times daily. ) 270 capsule 0  . glucose blood test strip Use as instructed 100 each 12  . guaiFENesin (ROBITUSSIN) 100 MG/5ML liquid Take 100 mg by mouth 3 (three) times daily as needed for cough.     . hydrALAZINE (APRESOLINE) 50 MG tablet Take 1.5 tablets (75 mg total) by mouth every 8 (eight) hours. 90 tablet 0  . insulin glargine (LANTUS) 100 UNIT/ML injection Inject 0.12 mLs (12 Units total) into the skin daily. 10 mL 0  . insulin lispro (HUMALOG KWIKPEN) 100 UNIT/ML KiwkPen Inject 3 Units into the skin 3 (three) times daily. Hold if meal time BS is 100 or less. Call MD if BS greater than 450    . Lancets 28G MISC 1 each by Does not apply route daily. 100 each 3  .  loperamide (IMODIUM) 2 MG capsule Take 2 mg by mouth daily as needed for diarrhea or loose stools. DO NOT EXCEED 8 DOSES IN 24 HOURS     . magnesium hydroxide (MILK OF MAGNESIA) 400 MG/5ML suspension Take 30 mLs by mouth at bedtime as needed for mild constipation or moderate constipation.    . Multiple Vitamin (MULTIVITAMIN WITH MINERALS) TABS tablet Take 1 tablet by mouth daily.    Marland Kitchen PARoxetine (PAXIL) 40 MG tablet Take 40 mg by mouth every morning.    . polyethylene glycol (MIRALAX / GLYCOLAX) 17 g packet Take 17 g by mouth 2 (two) times daily.    . QUEtiapine (SEROQUEL) 25 MG tablet Take 1 tablet (25 mg total) by mouth at bedtime. (Patient taking differently: Take 25 mg by mouth 2 (two) times daily. ) 15 tablet 0  . senna-docusate (SENOKOT-S) 8.6-50 MG tablet Take 1 tablet by mouth daily.    . Skin Protectants, Misc. (DIMETHICONE-ZINC OXIDE) cream Apply 1 application topically See admin instructions. Apply 1/4 inch topically to Buttocks/Peri-Area with incontinence care/Nathing for skin protection    . tamsulosin (FLOMAX) 0.4 MG CAPS capsule Take 0.4 mg by mouth daily.    . valACYclovir (VALTREX) 500 MG  tablet Take 500 mg by mouth daily.      Objective: BP (!) 115/51   Pulse (!) 59   Temp (!) 94.9 F (34.9 C) Comment: warm bankets placed on pt   Resp 11   Ht 5\' 8"  (1.727 m)   SpO2 99%   BMI 22.32 kg/m  Exam: General: 74 y.o somnolent male  Eyes: PERRL ENTM: mucus membranes dry Cardiovascular:RRR,  Systolic murmur loudest in right upper sternal border Respiratory: CTAB, no wheezes or crackles appreciated Gastrointestinal: Soft, non tender, non distended. BS present Extremities: no lower extremity edema GU: penile implantation, urethral meatus split ventrally.  Noninfected, shallow ulceration just proximal to glans dorsally Neuro: opens eyes to speech, does not obey commands   Labs and Imaging: CBC BMET  Recent Labs  Lab 05/28/19 1404  WBC 15.1*  HGB 9.3*  HCT 30.2*  PLT 192   Recent Labs  Lab 05/28/19 1404  NA 138  K 5.3*  CL 107  CO2 20*  BUN 50*  CREATININE 2.86*  GLUCOSE 196*  CALCIUM 8.7*     EKG: EKG Interpretation  Date/Time:  Friday May 28 2019 13:41:28 EST Ventricular Rate:  62 PR Interval:    QRS Duration: 141 QT Interval:  467 QTC Calculation: 475 R Axis:   75 Text Interpretation: Sinus rhythm LAE, consider biatrial enlargement Right bundle branch block No significant change since last tracing Confirmed by Isla Pence [phone]) on 05/28/2019 3:04:12 PM   CT Head Wo Contrast  Result Date: 05/28/2019 CLINICAL DATA:  Change with an unknown cause. EXAM: CT HEAD WITHOUT CONTRAST TECHNIQUE: Contiguous axial images were obtained from the base of the skull through the vertex without intravenous contrast. COMPARISON:  April 01, 2018 FINDINGS: Brain: No evidence of acute infarction, hemorrhage, hydrocephalus, extra-axial collection or mass lesion/mass effect. Again noted are chronic microvascular ischemic changes, atrophy, and bilateral lacunar infarcts. Vascular: No hyperdense vessel or unexpected calcification. Skull: Normal. Negative for fracture  or focal lesion. Sinuses/Orbits: There is mild mucosal thickening of the ethmoid air cells and bilateral maxillary sinuses. The remaining paranasal sinuses and mastoid air cells are essentially clear. Other: None. IMPRESSION: 1. No acute intracranial abnormality. 2. Chronic findings as detailed above, not substantially changed from prior study in 2020. Electronically Signed  By: Constance Holster M.D.   On: 05/28/2019 14:41   DG Chest Port 1 View  Result Date: 05/28/2019 CLINICAL DATA:  Altered mental status. EXAM: PORTABLE CHEST 1 VIEW COMPARISON:  05/04/2019 FINDINGS: 1356 hours. Low lung volumes. Cardiopericardial silhouette is at upper limits of normal for size. Interstitial markings are diffusely coarsened with chronic features. Streaky opacity at the left base is similar to prior likely chronic atelectasis or scarring. No focal airspace consolidation. The visualized bony structures of the thorax are intact. Telemetry leads overlie the chest. IMPRESSION: Low volume film with chronic interstitial changes and left base atelectasis or scarring. Electronically Signed   By: Misty Stanley M.D.   On: 05/28/2019 14:18   Carollee Leitz, MD 05/28/2019, 4:43 PM PGY-1, [place] Intern pager: [phone], text pages welcome  Resident Addendum I have separately seen and examined the patient.  I have discussed the findings and exam with the resident and agree with the above note.  I helped develop the management plan that is described in the resident's note and I agree with the content.  Changes have been made in BLUE.   Addison Naegeli, MD PGY-2 [facility] residency program

## 2019-05-28 NOTE — ED Triage Notes (Signed)
Pt here from nursing home wth c/o low blood pressure weakness and aloc , pt was here for aloc , cbg 189

## 2019-05-28 NOTE — ED Provider Notes (Signed)
  Physical Exam  BP (!) 115/51   Pulse (!) 59   Temp (!) 94.9 F (34.9 C) Comment: warm bankets placed on pt   Resp 11   Ht 5\' 8"  (1.727 m)   SpO2 99%   BMI 22.32 kg/m   Physical Exam  ED Course/Procedures   Clinical Course as of May 27 2325  Fri May 28, 2019  1445 Temp(!): 94.9 F (34.9 C) [MV]  1533 Dementia patient, sepsis, from facility. Unknown source at this time, waiting on urine.    [KM]  [id number] Discussed with [facility] who will admit patient for sepsis due to UTI   [KM]    Clinical Course User Index [KM] Alveria Apley, PA-C [MV] Eustaquio Maize, PA-C    Procedures  MDM  Patient care assumed from [facility] PA due to change of shift. Please see her note for full HPI. Briefly, this is a dementia patient coming in as code sepsis due to WBC count 15, hypothermia, increased lethargy. Unknown source at this time with normal head CT and chest xray. Pending UA. Patient getting broad spectrum abx, fluids for AKI  UR + for UTI. Will admit for sepsis     Kristine Royal 05/28/19 [id number]    Tegeler, Gwenyth Allegra, MD 05/29/19 [phone]

## 2019-05-28 NOTE — Significant Event (Signed)
Rapid Response Event Note  Overview: Called d/t MEWS-8 for unresponsiveness(Pt here with AMS), RR-26, and temp-102.9. MD was notified of MEWS score by RN.  Pt was given tylenol at 2116.  Temp still 103@ 2208. Order for 1L NS bolus. Plan-give bolus, recheck temp, and continue to monitor. Please call RRT if assistance needed.     Manuel Randolph

## 2019-05-29 DIAGNOSIS — A419 Sepsis, unspecified organism: Secondary | ICD-10-CM

## 2019-05-29 DIAGNOSIS — N179 Acute kidney failure, unspecified: Secondary | ICD-10-CM

## 2019-05-29 DIAGNOSIS — R6520 Severe sepsis without septic shock: Secondary | ICD-10-CM

## 2019-05-29 DIAGNOSIS — R404 Transient alteration of awareness: Secondary | ICD-10-CM

## 2019-05-29 LAB — COMPREHENSIVE METABOLIC PANEL
ALT: 12 U/L (ref 0–44)
AST: 18 U/L (ref 15–41)
Albumin: 2.2 g/dL — ABNORMAL LOW (ref 3.5–5.0)
Alkaline Phosphatase: 68 U/L (ref 38–126)
Anion gap: 9 (ref 5–15)
BUN: 48 mg/dL — ABNORMAL HIGH (ref 8–23)
CO2: 20 mmol/L — ABNORMAL LOW (ref 22–32)
Calcium: 7.9 mg/dL — ABNORMAL LOW (ref 8.9–10.3)
Chloride: 113 mmol/L — ABNORMAL HIGH (ref 98–111)
Creatinine, Ser: 3.09 mg/dL — ABNORMAL HIGH (ref 0.61–1.24)
GFR calc Af Amer: 22 mL/min — ABNORMAL LOW (ref >60)
GFR calc non Af Amer: 19 mL/min — ABNORMAL LOW (ref >60)
Glucose, Bld: 105 mg/dL — ABNORMAL HIGH (ref 70–99)
Potassium: 4.6 mmol/L (ref 3.5–5.1)
Sodium: 142 mmol/L (ref 135–145)
Total Bilirubin: 0.4 mg/dL (ref 0.3–1.2)
Total Protein: 5.8 g/dL — ABNORMAL LOW (ref 6.5–8.1)

## 2019-05-29 LAB — FERRITIN: Ferritin: 95 ng/mL (ref 24–336)

## 2019-05-29 LAB — BLOOD CULTURE ID PANEL (REFLEXED)

## 2019-05-29 LAB — CBC
HCT: 23.9 % — ABNORMAL LOW (ref 39.0–52.0)
Hemoglobin: 7.3 g/dL — ABNORMAL LOW (ref 13.0–17.0)
MCH: 31.2 pg (ref 26.0–34.0)
MCHC: 30.5 g/dL (ref 30.0–36.0)
MCV: 102.1 fL — ABNORMAL HIGH (ref 80.0–100.0)
Platelets: 172 10*3/uL (ref 150–400)
RBC: 2.34 MIL/uL — ABNORMAL LOW (ref 4.22–5.81)
RDW: 14.6 % (ref 11.5–15.5)
WBC: 14.6 10*3/uL — ABNORMAL HIGH (ref 4.0–10.5)
nRBC: 0 % (ref 0.0–0.2)

## 2019-05-29 LAB — PROTIME-INR
INR: 1.5 — ABNORMAL HIGH (ref 0.8–1.2)
Prothrombin Time: 17.5 seconds — ABNORMAL HIGH (ref 11.4–15.2)

## 2019-05-29 LAB — GLUCOSE, CAPILLARY
Glucose-Capillary: 103 mg/dL — ABNORMAL HIGH (ref 70–99)
Glucose-Capillary: 99 mg/dL (ref 70–99)
Glucose-Capillary: 73 mg/dL (ref 70–99)
Glucose-Capillary: 115 mg/dL — ABNORMAL HIGH (ref 70–99)

## 2019-05-29 LAB — RETICULOCYTES
Immature Retic Fract: 10.2 % (ref 2.3–15.9)
RBC.: 2.3 MIL/uL — ABNORMAL LOW (ref 4.22–5.81)
Retic Count, Absolute: 36.8 10*3/uL (ref 19.0–186.0)
Retic Ct Pct: 1.6 % (ref 0.4–3.1)

## 2019-05-29 LAB — ABO/RH: ABO/RH(D): AB POS

## 2019-05-29 LAB — TYPE AND SCREEN
ABO/RH(D): AB POS
Antibody Screen: NEGATIVE

## 2019-05-29 LAB — IRON AND TIBC
Iron: 10 ug/dL — ABNORMAL LOW (ref 45–182)
Saturation Ratios: 5 % — ABNORMAL LOW (ref 17.9–39.5)
TIBC: 207 ug/dL — ABNORMAL LOW (ref 250–450)
UIBC: 197 ug/dL

## 2019-05-29 LAB — CORTISOL-AM, BLOOD: Cortisol - AM: 15.8 ug/dL (ref 6.7–22.6)

## 2019-05-29 LAB — FOLATE: Folate: 29.2 ng/mL (ref >5.9)

## 2019-05-29 LAB — VITAMIN B12: Vitamin B-12: 357 pg/mL (ref 180–914)

## 2019-05-29 LAB — PROCALCITONIN: Procalcitonin: 5.54 ng/mL

## 2019-05-29 LAB — RPR: RPR Ser Ql: NONREACTIVE

## 2019-05-29 LAB — HIV ANTIBODY (ROUTINE TESTING W REFLEX): HIV Screen 4th Generation wRfx: NONREACTIVE

## 2019-05-29 MED ORDER — SODIUM CHLORIDE 0.9 % IV SOLN
2.0000 g | INTRAVENOUS | Status: DC
  Administered 2019-05-29 – 2019-05-30 (×2): 2 g via INTRAVENOUS
  Filled 2019-05-29 (×2): qty 2

## 2019-05-29 NOTE — Progress Notes (Signed)
[facility] called twice to obtain COVID19 test result from 04/28/2019(mentioned in Dr. Richardson Landry note from 05/28/19 at 1642).   On second call was able to speak to [facility] Curator). Who states that patient did test positive but she is unable to access the actual test date and result.  Lilyan Punt states she will call case manager for assistance and reach back out to me with this information.    [facility] fax number and unit number given to [facility].

## 2019-05-29 NOTE — Progress Notes (Signed)
PHARMACY - PHYSICIAN COMMUNICATION CRITICAL VALUE ALERT - BLOOD CULTURE IDENTIFICATION (BCID)  Manuel Randolph. is an 74 y.o. male who presented to [facility] on 05/28/2019 with a chief complaint of AMS/sepsis  Assessment:   1/2 blood cultures growing E. Coli  Name of physician (or Provider) Contacted:  Dr. Chauncey Reading and Ky Barban   Current antibiotics: Vancomycin and Cefepime   Changes to prescribed antibiotics recommended:  Change to Rocephin 2 g IV q24h  Results for orders placed or performed during the hospital encounter of 05/28/19  Blood Culture ID Panel (Reflexed) (Collected: 05/28/2019  2:39 PM)  Result Value Ref Range   Enterococcus species NOT DETECTED NOT DETECTED   Listeria monocytogenes NOT DETECTED NOT DETECTED   Staphylococcus species NOT DETECTED NOT DETECTED   Staphylococcus aureus (BCID) NOT DETECTED NOT DETECTED   Streptococcus species NOT DETECTED NOT DETECTED   Streptococcus agalactiae NOT DETECTED NOT DETECTED   Streptococcus pneumoniae NOT DETECTED NOT DETECTED   Streptococcus pyogenes NOT DETECTED NOT DETECTED   Acinetobacter baumannii NOT DETECTED NOT DETECTED   Enterobacteriaceae species DETECTED (A) NOT DETECTED   Enterobacter cloacae complex NOT DETECTED NOT DETECTED   Escherichia coli DETECTED (A) NOT DETECTED   Klebsiella oxytoca NOT DETECTED NOT DETECTED   Klebsiella pneumoniae NOT DETECTED NOT DETECTED   Proteus species NOT DETECTED NOT DETECTED   Serratia marcescens NOT DETECTED NOT DETECTED   Carbapenem resistance NOT DETECTED NOT DETECTED   Haemophilus influenzae NOT DETECTED NOT DETECTED   Neisseria meningitidis NOT DETECTED NOT DETECTED   Pseudomonas aeruginosa NOT DETECTED NOT DETECTED   Candida albicans NOT DETECTED NOT DETECTED   Candida glabrata NOT DETECTED NOT DETECTED   Candida krusei NOT DETECTED NOT DETECTED   Candida parapsilosis NOT DETECTED NOT DETECTED   Candida tropicalis NOT DETECTED NOT DETECTED    Caryl Pina 05/29/2019  6:05 AM

## 2019-05-29 NOTE — Progress Notes (Signed)
Since patient was positive for Covid over 21 days ago and showed clinical improvement, isolation is not needed. Patient is still within the 90 days where additional testing is not recommended. Please see [place] guidelines on [employer] for more details.

## 2019-05-29 NOTE — Progress Notes (Addendum)
FPTS Interim Progress Note Went to bedside at daughters request. Daughter is worried that her father does not look well. He is lying in bed with eyes closed with a clinched left fist over his forehead at this time in a somewhat relaxed state. She showed me a video of when she came into his room of arm flexing posturing, eye deviation, and mandibular deviation. Will make brain MRI stat and get an EEG.  Gerlene Fee, DO 05/29/2019, 7:46 PM PGY-1, [place] Medicine Service pager [phone]

## 2019-05-29 NOTE — Progress Notes (Signed)
Verbal received from Dr. Janus Molder to give patient water.  Patient tolerated water from spoon, water from cup and water from straw.  Verbal order received to give patient a soft diet.

## 2019-05-29 NOTE — Progress Notes (Signed)
Attempted to contact [facility] to follow up on [place] test result from 04/28/19.  No answer.

## 2019-05-29 NOTE — Progress Notes (Signed)
FPTS Interim Progress Note Spoke with Manuel Randolph daughter Manuel Randolph). Gave her an update on the blood culture growing E. Coli. And changing the antibiotic to CTX. We also breifly talked about his kidney function and supporting him with fluids and looking for improvement over the next few days.  She would like a call back when the brain MRI results come back.  Gerlene Fee, DO 05/29/2019, 12:21 PM PGY-1, [place] Medicine Service pager [phone]

## 2019-05-29 NOTE — Discharge Summary (Addendum)
[facility] Discharge Summary  Patient name: Manuel Randolph Medical record number: [id number] Date of birth: 10-May-1945 Age: 74 y.o. Gender: male Date of Admission: 05/28/2019  Date of Discharge: 06/03/2019 Admitting Physician: Kinnie Feil, MD  Primary Care Provider: System, Pcp Not In Consultants: Palliative care, neurology  Indication for Hospitalization: Altered mental status with urosepsis  Discharge Diagnoses/Problem List:  Altered mental status/urosepsis with E. 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  Regular rhythm.  1+ radial pulses bilaterally Respiratory: Lungs clear to auscultation bilaterally.  No crackles or wheezes. Abdomen: Soft, tender to palpation diffusely.  Normal bowel sounds. Extremities: Thin extremities.  No edema.  Brief Hospital Course:  Manuel Greenhouse Sr. is a 74 y.o. male presenting with AMS and sepsis. PMH is significant for dementia, DMT2, HTN, [name], PVD, AVM of small intestine, CKD, h/o urinary retention, penile implant.  AMS  UroSepsis w/ E.coli Bacteremia Upon arrival to ED, GCS 11, oral temperature 96.8*F, and rectal temperature 94.9*F. Leukocytosis 15>14. CXR w/o signs of infection. CT head negative for acute changes. COVID-19  positive on 04/28/19, received vaccines on 1/22 and 2/19. UA positive for large leukocytes, but nitrite negative, protein 100. UA microscopy with many bacteria, WBCs >50, RBC/HPF 11-20. Blood culture grew GNR, E. Coli. Treated in ED with cefepime, flagyl, and vancomycin. Later transitioned to 2g IV ceftriaxone when sensitivities returned. Urine culture grew >100,000CFU E. Coli and was found to be ESBL. Started meropenem twice daily for 2 days. Vital signs were labile and patient remained mostly unresponsive.   Seizure activity 2/27 Developed bilateral arm rigidity witness by daughter. Neurology consulted. EEG more consistent with seizure activity over other etiologies. Given ativan and fosphenytoin x1 and then started on phenytoin 100mg  twice daily for maintenance.  Goals of Care 3/2 given patient's diagnosis and poor prognosis, palliative care was consulted.  Evaluation for residential hospice eligibility was initiated.  Patient was transitioned to comfort care after careful consideration by family members.  3/3 patient information was admitted to hospice home of [facility].  3/4 patient was accepted to hospice care of [facility] and discharged for comfort care. Antibiotic treatment had been discontinued prior to discharge and penile implant remains in place.  Issues for Follow Up:  1. Comfort care including, but not limited to, anti-seizure therapy  Significant Procedures:  2/28-EEG  Significant Labs and Imaging:  Recent Labs  Lab 05/30/19 0259 05/31/19 0313 06/01/19 0456  WBC 12.0* 12.9* 11.9*  HGB 7.4* 7.8* 7.7*  HCT 23.5* 25.1* 25.1*  PLT 177 178 205   Recent Labs  Lab 05/28/19 1404 05/28/19 1404 05/29/19 0406 05/29/19 0406 05/30/19 0259 05/30/19 0259 05/31/19 0313 06/01/19 0331  NA 138  --  142  --  141  --  145 146*  K 5.3*   < > 4.6   < > 4.3   < > 4.2 4.0  CL  107  --  113*  --  116*  --  115* 119*  CO2 20*  --  20*  --  19*  --  19* 19*  GLUCOSE 196*  --  105*   --  129*  --  120* 86  BUN 50*  --  48*  --  43*  --  34* 25*  CREATININE 2.86*  --  3.09*  --  2.58*  --  2.15* 1.93*  CALCIUM 8.7*  --  7.9*  --  7.9*  --  8.1* 8.1*  ALKPHOS 87  --  68  --   --   --  66  --   AST 21  --  18  --   --   --  16  --   ALT 17  --  12  --   --   --  12  --   ALBUMIN 3.0*  --  2.2*  --   --   --  2.1*  --    < > = values in this interval not displayed.   EEG: IMPRESSION: This study showed evidence of epileptogenicity in right frontocentral region as well as moderate diffuse encephalopathy, non specific to etiology.    One event of left arm twitching was noted per tech annotation without concomitant eeg change. However, focal motor seizure may not be seen on scalp eeg. Clinical correlation is recommended.   Results/Tests Pending at Time of Discharge: None   Discharge Medications:  Allergies as of 06/03/2019   No Known Allergies      Medication List     STOP taking these medications    acetaminophen 500 MG tablet Commonly known as: TYLENOL Replaced by: acetaminophen 650 MG suppository   amLODipine 10 MG tablet Commonly known as: NORVASC   aspirin EC 81 MG tablet   atorvastatin 10 MG tablet Commonly known as: LIPITOR   BAZA PROTECT EX   docusate sodium 100 MG capsule Commonly known as: COLACE   gabapentin 300 MG capsule Commonly known as: NEURONTIN   glucose blood test strip   HumaLOG KwikPen 100 UNIT/ML KiwkPen Generic drug: insulin lispro   hydrALAZINE 50 MG tablet Commonly known as: APRESOLINE   Lancets 28G Misc   loperamide 2 MG capsule Commonly known as: IMODIUM   magnesium hydroxide 400 MG/5ML suspension Commonly known as: MILK OF MAGNESIA   Mintox [id number] MG/5ML suspension Generic drug: alum & mag hydroxide-simeth   multivitamin with minerals Tabs tablet   PARoxetine 40 MG tablet Commonly known as: PAXIL   polyethylene glycol 17 g packet Commonly known as: MIRALAX / GLYCOLAX   QUEtiapine 25 MG  tablet Commonly known as: SEROQUEL   Robafen 100 MG/5ML syrup Generic drug: guaifenesin   senna-docusate 8.6-50 MG tablet Commonly known as: Senokot-S   tamsulosin 0.4 MG Caps capsule Commonly known as: FLOMAX   valACYclovir 500 MG tablet Commonly known as: VALTREX       TAKE these medications    acetaminophen 650 MG suppository Commonly known as: TYLENOL Place 1 suppository (650 mg total) rectally every 6 (six) hours as needed for mild pain (or Fever >/= 101). Replaces: acetaminophen 500 MG tablet   acetaminophen 325 MG tablet Commonly known as: TYLENOL Take 2 tablets (650 mg total) by mouth every 6 (six) hours as needed for mild pain (or Fever >/= 101).   glycopyrrolate 0.2 MG/ML injection Commonly known as: ROBINUL Inject 1 mL (0.2 mg total) into the vein every 4 (four) hours as needed (  secretions).   LORazepam 1 MG tablet Commonly known as: ATIVAN Take 1 tablet (1 mg total) by mouth every 6 (six) hours.   morphine CONCENTRATE 10 MG/0.5ML Soln concentrated solution Take 0.25 mLs (5 mg total) by mouth every 6 (six) hours.   phenytoin 100 MG ER capsule Commonly known as: DILANTIN Take 1 capsule (100 mg total) by mouth every 12 (twelve) hours.   scopolamine 1 MG/3DAYS Commonly known as: TRANSDERM-SCOP Place 1 patch (1.5 mg total) onto the skin every 3 (three) days. Start taking on: June 05, 2019        Discharge Instructions: Please refer to Patient Instructions section of EMR for full details.  Patient was counseled important signs and symptoms that should prompt return to medical care, changes in medications, dietary instructions, activity restrictions, and follow up appointments.   Follow-Up Appointments:    Gifford Shave, MD 06/03/2019, 1:12 PM PGY-1, [facility]  Note reviewed and edited. Agree with Dr Jolene Provost evaluation. Doristine Mango, DO PGY-2

## 2019-05-29 NOTE — Progress Notes (Signed)
Family Medicine Teaching Service Daily Progress Note Intern Pager: [phone]  Patient name: Manuel Randolph. Medical record number: [id number] Date of birth: 06-15-1945 Age: 74 y.o. Gender: male  Primary Care Provider: System, Pcp Not In Consultants: None Code Status: FULL  Pt Overview and Major Events to Date:  2/26 Admitted  Assessment and Plan:  Manuel PERALES Sr. is a 74 y.o. male presenting with AMS and sepsis. PMH is significant for dementia, DMT2, HTN, [name], PVD, AVM of small intestine, CKD, h/o urinary retention, penile implant.  AMS  Sepsis Bacteremia Upon arrival to ED, GCS 11, oral temperature 96.8*F, and rectal temperature 94.9*F. Leukocytosis 15>14. CXR w/o signs of infection. CT head negative for acute changes. COVID-19 now positive but pt tested COVID-19 positive on 04/28/19 (not in our system), received vaccines on 1/22 and 2/19, out of 21 day window and will likely continue to test COVID positive. UA positive for large leukocytes, but nitrite negative, protein 100. UA microscopy with many bacteria, WBCs >50, RBC/HPF 11-20. Previous urine cultures in December 2020 and on 05/04/19 were positive for E. Faecalis that was pan sensitive. Urine cx  pending. Blood culture grew GNR, E. Coli. Treated in ED with cefepime, flagyl, and vancomycin. -continuous cardiac monitoring, pulse ox -AM EKG  -NS IVF @ 140mL/hr -Discontinue cefepime, flagyl, narrow antibiotics to CTX 2g IV q24h -N.p.o. -Follow-up RPR, HIV; consider Gonorrhea chlamydia/trich -We will need PT/OT when pain is proved and SLP for swallow eval  Penile Implant  H/O urinary retention  Peyronie's disease  HSV Patient with h/o of ED and peyronie's disease, had an inflatable penile prosthesis placed in 11/2012. Home medications flomax 0.4 mg, and valtrex. Patient also has a h/o HSV, currently ulcer present just proximal to the dorsal side of glans of penis.  - RPR, HIV; consider Gonorrhea chlamydia/trich -Referral to  urology to consider removal of penile implant after multiple UTIs and uroseptic events    DM2 with peripheral neuropathy CBG 103 Home medications include Insulin lispro 3U TID and hold for meal time BGL 100 or less, lantus 12 U daily. Gabapentin 300 mg TID (renal dosing). -sliding scale insulin with CBG q4h -hold home meds, gabapentin held d/t NPO  AKI on CKD Stage 3 Baseline cr in January 2020 around 1.7, on 2/18 cr 1.68, today cr elevated to 3.09.  -Avoid nephrotoxic agents as much as possible -IVF NS @ 125 mL/hr -AM BMP  HTN BP 123/55 Home medications include amlodipine 10mg , hydralazine 75 mg TID. -NPO currently, home medications held in setting of sepsis.  HLD Home medications include ASA 81 mg, atorvastatin 10 mg. -NPO, home medications held  Dementia Multiple chronic lacunar infarcts on CT head. Home medication paxil 40 mg, seroquel 25 mg qhs. -NPO currently, hold home medications -Consider palliative consult and discussion of CODE STATUS with daughter given his poor baseline mental status.  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 Baseline hgb around 9-10. Hgb 7.3 this AM. -Type and screen; consider transfusing  Aortic Valve stenosis Mild aortic valve stenosis initially documented on echo 12/2007.  Holosystolic murmur 2/6 auscultated.  Echo in January 2019: LVEF [id number], normal systolic function, normal wall motion, G1DD, mild stenosis noted of aortic valve (mean gradient 14 mmHg), mitral valve with calcified annulus.   [name] Currently stable. Diagnosed with Rai stage 0 [name] in 2014, flow cytometry c/w [name].    PVD H/o of ulcerations and gangrene  of L foot, popliteal artery angioplasty L 2014.  Tobacco use Patient has >50 years smoking history. No CT on file.  FEN/GI: NPO Prophylaxis: Lovenox  Disposition: SNF pending repeat improved  mental status and abx treatment for bacteremia  Subjective:  Initially sleeping and easily woken up. He says the ringing phone is for me. Most other words of our conversation was incomprehensible. He abruptly stopped conversing with me. He does not answer to questions such as what is your name, where are you, what year is it. Looks off into the distance but continues to answer to name. When asked to follow my finger with just his eyes he said he would but did not. Continued to have a fixed gaze. Increasingly agitated with bodily language left hand fast pill rolling motion with my presence as time passed in the room. This movement decreased when I expressed I was leaving.  Objective: Temp:  [94.9 F (34.9 C)-103 F (39.4 C)] 99.1 F (37.3 C) (02/27 0544) Pulse Rate:  [59-107] 74 (02/27 0544) Resp:  [11-27] 18 (02/27 0333) BP: (103-170)/(47-93) 122/51 (02/27 0544) SpO2:  [90 %-100 %] 100 % (02/27 0544) Weight:  [57.8 kg] 57.8 kg (02/27 0333) Physical Exam:  General: Appears unwell, no acute distress. Age appropriate.  Cardiac: RRR, normal heart sounds, no murmurs Respiratory: CTAB, normal effort Extremities: No edema or cyanosis.  Skin: Warm and dry, no rashes noted. Extensive skin flaking of the lower ext. Neuro: alert not oriented, does not follow commands. Slurred speech. Asymmetric smile. Left hand flexed and abducted with pill rolling motion. Initially focused when conversing but then with a fixed gaze and intermittently answering. Psych: Increasing agitated with bodily language   Laboratory: Recent Labs  Lab 05/28/19 1404 05/29/19 0406  WBC 15.1* 14.6*  HGB 9.3* 7.3*  HCT 30.2* 23.9*  PLT 192 172   Recent Labs  Lab 05/28/19 1404 05/29/19 0406  NA 138 142  K 5.3* 4.6  CL 107 113*  CO2 20* 20*  BUN 50* 48*  CREATININE 2.86* 3.09*  CALCIUM 8.7* 7.9*  PROT 7.3 5.8*  BILITOT 0.7 0.4  ALKPHOS 87 68  ALT 17 12  AST 21 18  GLUCOSE 196* 105*    Imaging/Diagnostic  Tests: PORTABLE CHEST 1 VIEW COMPARISON:  05/04/2019 IMPRESSION: Low volume film with chronic interstitial changes and left base atelectasis or scarring.  CT HEAD WITHOUT CONTRAST COMPARISON:  April 01, 2018 IMPRESSION: 1. No acute intracranial abnormality. 2. Chronic findings as detailed above, not substantially changed from prior study in 2020.   Gerlene Fee, DO 05/29/2019, 8:17 AM PGY-1, [place] Intern pager: [phone], text pages welcome

## 2019-05-30 ENCOUNTER — Inpatient Hospital Stay (HOSPITAL_COMMUNITY): Payer: Medicare Other

## 2019-05-30 DIAGNOSIS — R319 Hematuria, unspecified: Secondary | ICD-10-CM

## 2019-05-30 DIAGNOSIS — F0390 Unspecified dementia without behavioral disturbance: Secondary | ICD-10-CM

## 2019-05-30 DIAGNOSIS — R9401 Abnormal electroencephalogram [EEG]: Secondary | ICD-10-CM

## 2019-05-30 DIAGNOSIS — N390 Urinary tract infection, site not specified: Secondary | ICD-10-CM

## 2019-05-30 DIAGNOSIS — G92 Toxic encephalopathy: Secondary | ICD-10-CM

## 2019-05-30 LAB — CBC
HCT: 23.5 % — ABNORMAL LOW (ref 39.0–52.0)
Hemoglobin: 7.4 g/dL — ABNORMAL LOW (ref 13.0–17.0)
MCH: 32.2 pg (ref 26.0–34.0)
MCHC: 31.5 g/dL (ref 30.0–36.0)
MCV: 102.2 fL — ABNORMAL HIGH (ref 80.0–100.0)
Platelets: 177 10*3/uL (ref 150–400)
RBC: 2.3 MIL/uL — ABNORMAL LOW (ref 4.22–5.81)
RDW: 14.6 % (ref 11.5–15.5)
WBC: 12 10*3/uL — ABNORMAL HIGH (ref 4.0–10.5)
nRBC: 0 % (ref 0.0–0.2)

## 2019-05-30 LAB — BASIC METABOLIC PANEL
Anion gap: 6 (ref 5–15)
BUN: 43 mg/dL — ABNORMAL HIGH (ref 8–23)
CO2: 19 mmol/L — ABNORMAL LOW (ref 22–32)
Calcium: 7.9 mg/dL — ABNORMAL LOW (ref 8.9–10.3)
Chloride: 116 mmol/L — ABNORMAL HIGH (ref 98–111)
Creatinine, Ser: 2.58 mg/dL — ABNORMAL HIGH (ref 0.61–1.24)
GFR calc Af Amer: 28 mL/min — ABNORMAL LOW (ref >60)
GFR calc non Af Amer: 24 mL/min — ABNORMAL LOW (ref >60)
Glucose, Bld: 129 mg/dL — ABNORMAL HIGH (ref 70–99)
Potassium: 4.3 mmol/L (ref 3.5–5.1)
Sodium: 141 mmol/L (ref 135–145)

## 2019-05-30 LAB — URINE CULTURE: Culture: >=100000 — AB

## 2019-05-30 LAB — GLUCOSE, CAPILLARY
Glucose-Capillary: 104 mg/dL — ABNORMAL HIGH (ref 70–99)
Glucose-Capillary: 95 mg/dL (ref 70–99)
Glucose-Capillary: 121 mg/dL — ABNORMAL HIGH (ref 70–99)
Glucose-Capillary: 122 mg/dL — ABNORMAL HIGH (ref 70–99)

## 2019-05-30 LAB — PHENYTOIN LEVEL, TOTAL: Phenytoin Lvl: <2.5 ug/mL — ABNORMAL LOW (ref 10.0–20.0)

## 2019-05-30 LAB — AMMONIA: Ammonia: 13 umol/L (ref 9–35)

## 2019-05-30 MED ORDER — SODIUM CHLORIDE 0.9 % IV SOLN
20.0000 mg/kg | Freq: Once | INTRAVENOUS | Status: AC
  Administered 2019-05-30: 1200 mg via INTRAVENOUS
  Filled 2019-05-30: qty 4

## 2019-05-30 MED ORDER — LORAZEPAM 2 MG/ML IJ SOLN
INTRAMUSCULAR | Status: AC
  Filled 2019-05-30: qty 1

## 2019-05-30 MED ORDER — PHENYTOIN SODIUM 50 MG/ML IJ SOLN: 100.0000 mg | Freq: Three times a day (TID) | INTRAMUSCULAR | Status: DC

## 2019-05-30 MED ORDER — SODIUM CHLORIDE 0.9 % IV SOLN
1.0000 g | Freq: Two times a day (BID) | INTRAVENOUS | Status: DC
  Administered 2019-05-30 – 2019-06-01 (×5): 1 g via INTRAVENOUS
  Filled 2019-05-30 (×6): qty 1

## 2019-05-30 MED ORDER — PHENYTOIN SODIUM 50 MG/ML IJ SOLN
100.0000 mg | Freq: Two times a day (BID) | INTRAMUSCULAR | Status: DC
  Administered 2019-05-31 – 2019-06-01 (×3): 100 mg via INTRAVENOUS
  Filled 2019-05-30 (×4): qty 2

## 2019-05-30 MED ORDER — LORAZEPAM 2 MG/ML IJ SOLN
1.0000 mg | Freq: Once | INTRAMUSCULAR | Status: AC
  Administered 2019-05-30: 15:00:00 1 mg via INTRAVENOUS

## 2019-05-30 NOTE — Consult Note (Addendum)
Neurology Consultation  Reason for Consult: Altered mental status, left arm twitching, abnormal EEG Referring Physician: Dr. Freddrick March  CC: Altered mental status, left arm twitching, abnormal EEG  History is obtained from: Chart review  HPI: Manuel Randolph. is a 74 y.o. male who has a past medical history of dementia, diabetes, hypertension, hyperlipidemia, leukemia, CKD, resident of a memory care unit, brought to the hospital on 05/28/2019 with complaints of altered mental status and was admitted to the hospital for presumed urosepsis causing altered mental status. He is being treated for the urosepsis but the mentation is not much improved.  It was also noted that he had some left arm twitching.  Due to concern for seizures, an EEG was obtained which showed left frontal/left central region sharp waves and continuous generalized slowing.  1 event of left arm twitching was noted-not seen on video due to arm being covered in sheets but witnessed by an annotated by the technologist without any concomitant EEG change. Patient unable to provide any history at this time  Called patient's daughter: pt diagnosed with dementia 4 years ago, and living in facility fr 3 years. Has had mini strokes in the past. Yesterday at 6:30pm, daughter saw face twisted to one side. At baseline he is non-verbal and only moans. Rarely would he say a yes or no.  ROS: Unable to obtain due to altered mental status.   Past Medical History:  Diagnosis Date  . Anemia   . Diabetes ([place])   . GERD (gastroesophageal reflux disease)   . Hyperlipidemia   . Hypertension   . Leukemia ([place])    Family History  Problem Relation Age of Onset  . Diabetes Mother   . Diabetes Father    Social History:   reports that he has been smoking cigarettes. He has been smoking about 0.50 packs per day. He has never used smokeless tobacco. He reports that he does not drink alcohol or use drugs.  Medications  Current  Facility-Administered Medications:  .  0.9 %  sodium chloride infusion, , Intravenous, Continuous, Rumball, Alison, DO, Last Rate: 100 mL/hr at 05/30/19 0600, Rate Verify at 05/30/19 0600 .  acetaminophen (TYLENOL) tablet 650 mg, 650 mg, Oral, Q6H PRN **OR** acetaminophen (TYLENOL) suppository 650 mg, 650 mg, Rectal, Q6H PRN, Carollee Leitz, MD, 650 mg at 05/28/19 2116 .  enoxaparin (LOVENOX) injection 30 mg, 30 mg, Subcutaneous, Q24H, Carollee Leitz, MD, 30 mg at 05/29/19 1731 .  insulin aspart (novoLOG) injection 0-9 Units, 0-9 Units, Subcutaneous, TID WC, Carollee Leitz, MD .  meropenem (MERREM) 1 g in sodium chloride 0.9 % 100 mL IVPB, 1 g, Intravenous, Q12H, Donnamae Jude, [facility], Last Rate: 200 mL/hr at 05/30/19 1148, 1 g at 05/30/19 1148   Exam: Current vital signs: BP (!) 143/77   Pulse 71   Temp 98.8 F (37.1 C) (Rectal)   Resp 16   Ht 5\' 8"  (1.727 m)   Wt 60 kg   SpO2 99%   BMI 20.11 kg/m  Vital signs in last 24 hours: Temp:  [98.6 F (37 C)-100.5 F (38.1 C)] 98.8 F (37.1 C) (02/28 1200) Pulse Rate:  [71-86] 71 (02/28 1210) Resp:  [13-20] 16 (02/28 1335) BP: (134-152)/(56-87) 143/77 (02/28 1210) SpO2:  [95 %-100 %] 99 % (02/28 1210) Weight:  [60 kg] 60 kg (02/28 0430) General examination: Extremely drowsy, opens eyes to noxious stimulation only. HEENT: Normocephalic atraumatic dry oral mucous membranes CVS: Regular rate rhythm Respiratory: Breathing well saturating normally  on room air Extremities: Dry scaly skin on the feet with atrophy of both lower extremities. Neurological exam He is extremely drowsy, opens eyes only to noxious immolation once. When asked his name-with a very dysarthric tone he was able to say Manuel Randolph, he did not tell me his age. Upon asking to raise his leg he tried to wiggle his toes but was not able to raise it unless I helped him raise the leg. Unable to name or repeat sentences. Cranial nerves: Pupils are equal round reactive to light, tries to  close his eyes to prevent examination, prefers the right-has a definite right gaze preference, face is also asymmetric but unclear what side is weak as both nasolabial folds are equally prominent but the left angle of the mouth is open while the right angle of the mouth is closed and slightly drooped down. Motor exam: Had constant low amplitude rhythmic twitching of the left arm and also of the left lower face which was unable to be stopped by the examiner by applying pressure on his hand.  Right upper and lower extremity were antigravity-lower extremity had some drift but the upper extremity had no drift.  Left upper and lower extremity also that she was able to hold antigravity-with the lower extremity with drift and upper extremity with no drift. He exhibits paratonia in both upper extremities. Sensory exam: Withdraws all fours equally Coordination: Difficult to test with his mentation  Labs I have reviewed labs in epic and the results pertinent to this consultation are:  CBC    Component Value Date/Time   WBC 12.0 (H) 05/30/2019 0259   RBC 2.30 (L) 05/30/2019 0259   HGB 7.4 (L) 05/30/2019 0259   HGB 12.6 (L) 06/15/2013 1128   HCT 23.5 (L) 05/30/2019 0259   HCT 39.6 (L) 06/15/2013 1128   PLT 177 05/30/2019 0259   PLT 141 (L) 06/15/2013 1128   MCV 102.2 (H) 05/30/2019 0259   MCV 92 06/15/2013 1128   MCH 32.2 05/30/2019 0259   MCHC 31.5 05/30/2019 0259   RDW 14.6 05/30/2019 0259   RDW 16.2 (H) 06/15/2013 1128   LYMPHSABS 1.3 05/28/2019 1404   LYMPHSABS 9.8 (H) 06/15/2013 1128   MONOABS 1.5 (H) 05/28/2019 1404   MONOABS 0.8 06/15/2013 1128   EOSABS 0.0 05/28/2019 1404   EOSABS 0.3 06/15/2013 1128   BASOSABS 0.1 05/28/2019 1404   BASOSABS 0.1 06/15/2013 1128    CMP     Component Value Date/Time   NA 141 05/30/2019 0259   NA 140 08/14/2016 1537   NA 141 06/15/2013 1128   K 4.3 05/30/2019 0259   K 4.2 06/15/2013 1128   CL 116 (H) 05/30/2019 0259   CL 104 06/15/2013 1128    CO2 19 (L) 05/30/2019 0259   CO2 27 06/15/2013 1128   GLUCOSE 129 (H) 05/30/2019 0259   GLUCOSE 196 (H) 06/15/2013 1128   BUN 43 (H) 05/30/2019 0259   BUN 25 08/14/2016 1537   BUN 19 (H) 06/15/2013 1128   CREATININE 2.58 (H) 05/30/2019 0259   CREATININE 1.31 (H) 06/15/2013 1128   CALCIUM 7.9 (L) 05/30/2019 0259   CALCIUM 9.8 06/15/2013 1128   PROT 5.8 (L) 05/29/2019 0406   PROT 7.5 08/14/2016 1537   PROT 5.8 (L) 04/17/2013 2144   ALBUMIN 2.2 (L) 05/29/2019 0406   ALBUMIN 4.1 08/14/2016 1537   ALBUMIN 2.9 (L) 04/17/2013 2144   AST 18 05/29/2019 0406   AST 35 04/17/2013 2144   ALT 12 05/29/2019 0406  ALT 17 04/17/2013 2144   ALKPHOS 68 05/29/2019 0406   ALKPHOS 62 04/17/2013 2144   BILITOT 0.4 05/29/2019 0406   BILITOT <0.2 08/14/2016 1537   BILITOT 0.2 04/17/2013 2144   GFRNONAA 24 (L) 05/30/2019 0259   GFRNONAA 56 (L) 06/15/2013 1128   GFRAA 28 (L) 05/30/2019 0259   GFRAA >60 06/15/2013 1128    Imaging I have reviewed the images obtained:  CT-scan of the brain on 05/28/2019 with chronic microvascular changes and bilateral lacunar infarcts, unchanged from 2020 CT head. Likely not being able to get MRI due to a penile implant   EKG in the ER Sinus rhythm LAE, consider biatrial enlargement Right bundle branch block No significant change since last tracing Confirmed by Isla Pence [phone]) on 05/28/2019 3:04:12 PM  Assessment:  Mr. Stanczak is a 74 year old man with advanced dementia, CKD, diabetes, hypertension, hyperlipidemia and leukemia, resident of a memory care facility for the past 3 years, brought to the hospital for altered mental status likely presumably due to urosepsis. In the hospital, noted to have continued decreased level of consciousness and also left arm twitching that started yesterday.  Also noted some left lower facial twitching. EEG with left-sided sharps-that do not explain this left-sided twitching and did not capture any electrographic correlate  of the left arm twitching during the EEG. On my examination, he had right gaze preference, was able to say his name and was definitely having rhythmic jerking movements of the left lower face and arm. He has multiple derangements including urosepsis as well as uremia, which could cause movement disorders such as asterixis but the focality of his exam and then when I gave him 1 mg of Ativan, he was able to break his right gaze and look to the left makes me suspicious for an underlying focal seizure. There are a lot of things here in his examination and EEG that do not make perfect sense-for example the sharp waves are left-sided and his symptoms are left-sided which is hard to explain, as well as right gaze preference and left arm shaking goes with a stroke and not a seizure but in the postictal lethargy face the gaze can resemble that in a stroke. MRI probably not possible due to penile implant  Impression: -Evaluate for seizures-the fact that he had some improvement with Ativan makes me suspicious that this might be the etiology for his altered mental status -Evaluate for stroke-given risk factors, history of prior strokes as well as gaze looking away from the affected arm  -History of advanced dementia, currently admitted with urosepsis-could lower seizure threshold and could also make him susceptible for strokes due to hypotension from sepsis.  Recommendations: -Repeat a head CT -Start him on Dilantin-load with fosphenytoin 20 mg/kg PE equivalents followed by 100 3 times daily. -Check level within 2 hours of fosphenytoin load. -Pharmacy consultation for phenytoin dosing -Repeat CT again in the morning. -Check ammonia level -Continue to correct toxic metabolic derangements -I have a detailed conversation with the family regarding CODE STATUS because should he go in for status epilepticus, he might require intubation for further management. I had a detailed conversation with the daughter over the  phone regarding CODE STATUS and she says that she would not want her father and her father also would not want to be living with the poor quality of life that he has but they have not yet had a proper CODE STATUS discussion.   -I would encourage the primary team to pursue this with  the family.  The daughter is going to be here sometime later this afternoon and I will try to meet with her and discussed my plan from a neurological standpoint as well as an overall standpoint. -Neurology will follow with you -- Amie Portland, MD [facility] Pager: [phone] If 7pm to 7am, please call on call as listed on AMION.   Addendum If the EEG tomorrow remains unremarkable, and the CT head does not show any evolving or new stroke and he still continues that tremulous movement of the left arm, an underlying post stroke movement disorder versus underlying Parkinson's also should be considered in the differentials.  Given his extreme debility, at this time I would not initiate any treatment for this movement disorder.  If he has myoclonic jerking leg movements, clonazepam can be considered at the time.  -- Amie Portland, MD [facility] Pager: [phone] If 7pm to 7am, please call on call as listed on AMION.

## 2019-05-30 NOTE — Progress Notes (Signed)
Pharmacy Antibiotic Note  Manuel Randolph. is a 74 y.o. male admitted on 05/28/2019 with ESBL Ecoli  bacteremia and UTI.  Pharmacy has been consulted for meropenem dosing.  Patient is from a SNF and grew ESBL Ecoli in blood and urine cultures. Also with AKI on CKD, good UOP 1ml/kg/hr.   Plan: Meropenem 1g Q12 hr Monitor cultures, clinical status, renal fx, LOT  Height: 5\' 8"  (172.7 cm) Weight: 132 lb 4.4 oz (60 kg) IBW/kg (Calculated) : 68.4  Temp (24hrs), Avg:99.3 F (37.4 C), Min:98.6 F (37 C), Max:100.5 F (38.1 C)  Recent Labs  Lab 05/28/19 1404 05/29/19 0406 05/30/19 0259  WBC 15.1* 14.6* 12.0*  CREATININE 2.86* 3.09* 2.58*  LATICACIDVEN 1.5  --   --     Estimated Creatinine Clearance: 22 mL/min (A) (by C-G formula based on SCr of 2.58 mg/dL (H)).    No Known Allergies  Antimicrobials this admission: [facility] 2/28 >> CTX 2/27> 2/28 Vanc/cefe 2/26 MTZ 2/26-2/27   Microbiology results: 2/26 Ucx + ESBL Ecoli S mero, piptazo 2/26 Bcx 2/2 ESBL Ecoli  2/26 Covid + (>21d from original, not infxious) 1/27 covid +  Benetta Spar, PharmD, BCPS, [facility] Clinical Pharmacist  Please check AMION for all [place] phone numbers After 10:00 PM, call [place] [phone]

## 2019-05-30 NOTE — Plan of Care (Signed)
Called and spoke with the MRI technologist.  They are in the process of finding a details of his penile implant.  There is a chance that it might be conditionally safe for the MRI but we do not have all the information yet.  The technologists will update [place] once they have a confirmed answer  Appreciate their assistance in the care of this patient.  -- Amie Portland, MD [facility] Pager: [phone] If 7pm to 7am, please call on call as listed on AMION.

## 2019-05-30 NOTE — Progress Notes (Signed)
Spoke with the patient's daughter at bedside in detail. Explained to her the philosophy of care and treatment plan. She wants her father to be DNR. She provided more history where she describes that he has chronic alcoholism history.  This is correlated with his imaging findings of extensive cerebellar atrophy. She said that she is the [facility] POA and in discussion with her other siblings/patients of the children, they are all united in the opinion that he should be a DNR based on what his wishes would have been. I showed her that we will continue to treat him as much as he can to the point where we do not have to intubate him.  Recommendation: -His penile implant is MRI safe.  He is getting an MRI now.  We will follow up on the MRI in the morning. -No need to repeat CT tomorrow. -Recommendations as prior.  Discussed the case with Dr. Hortense Ramal. -Small addendum in the EEG report-sharp waves on the right side, which correlates with left-sided shaking-more likely to be seizure than the other etiologies and differentials discussed before.  Neurology will follow with you.  -- Amie Portland, MD [facility] Pager: [phone] If 7pm to 7am, please call on call as listed on AMION.

## 2019-05-30 NOTE — Progress Notes (Signed)
Family Medicine Teaching Service Daily Progress Note Intern Pager: [phone]  Patient name: Manuel Randolph. Medical record number: [id number] Date of birth: 12-20-1945 Age: 74 y.o. Gender: male  Primary Care Provider: System, Pcp Not In Consultants: None Code Status: Full  Pt Overview and Major Events to Date:  2/26-admitted 2/27-E. coli positive.  Ceftriaxone started 2/28-ESBL identified.  Meropenem started per pharmacy consult  Assessment and Plan: Manuel Randolph Sr.is a 74 y.o.malepresenting with AMS and sepsis. PMH is significant fordementia, DMT2, HTN, [name], PVD, AVM of small intestine, CKD, h/o urinary retention, penile implant.  AMS  UroSepsis w/ E.coli Bacteremia Patient still minimally responsive.  Was able to try to speak but was mostly unintelligible this morning.  Urine culture and blood culture positive for E.coli.  Urine Cx growing ESBL E. Coli. RPR, HIV negative.  Curbside ID regarding management for ESBL.  Stated we can start a Carbapenem.  Did not have a preference.  Recommended 7 days minimum IV treatment.  Daughter was worried about patient's mental status.  MRI brain and EEG were ordered to evaluate for stroke/seizure.  MRI brain on hold until penile implant type can be identified and is decided it is compatible with MRI.  This morning on my exam, the patient appears improved from 2 days ago and I did not notice any focal deficits. -Start meropenem.  (2/28-1/6) - Abx: cefepime, flagyl, vanc (2/26), CTX (2/27 - 2/28).  -continuous cardiac monitoring, pulse ox -Follow-up EEG -Find out if patient is eligible for MRI brain. -AM EKG  -NS IVF @ 134mL/hr -N.p.o. -We will need PT/OT when pain is proved and SLP for swallow eval  Hx of COVID infection COVID-19 now positive but pt testedCOVID-19 positive on 04/28/19 (not in our system), received vaccines on 1/22 and 2/19, out of 21 day window and will likely continue to test COVID positive.  - see infection  prevention note  Penile Implant  H/O urinary retention  Peyronie's disease  HSV Patient with h/o of ED and peyronie's disease, had an inflatable penile prosthesis placed in 11/2012. Home medications flomax 0.4 mg, and valtrex. Patient also has a h/o HSV, currently ulcer presentjust proximal to the dorsal side ofglans of penis.  - will need to know if penile implant is MRI compatible before head MRI.  - Referral to urology to consider removal of penile implant after multiple UTIs and uroseptic events   DM2 with peripheral neuropathy Home medications include Insulin lispro 3U TID and hold for meal time BGL 100 or less, lantus 12 U daily. Gabapentin 300 mg TID (renal dosing). -sliding scale insulin with CBG q4h -hold home meds, gabapentin held d/t NPO  AKI onCKD Stage 3 Baseline cr in January 2020 around 1.7, on 2/18. 2.86>3.09>2.58. BUN 43.  Likely dehydration.  -Avoid nephrotoxic agents as much as possible -IVF NS @ 125 mL/hr -AM BMP   HTN Normotensive today Home medications include amlodipine 10mg , hydralazine 75 mg TID. -NPO currently, home medications heldin setting of sepsis.  HLD Home medications include ASA 81 mg, atorvastatin 10 mg. -NPO, home medications held  Dementia Multiple chronic lacunar infarcts on CT head. Home medication paxil 40 mg, seroquel 25 mg qhs. -NPO currently, hold home medications -Consider palliative consult and discussion of CODE STATUS with daughter given his poor baseline mental status.  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 Baseline hgb around 9-10. Hgb 7.4 this AM. -Type and  screen; consider transfusing  Aortic Valvestenosis Mild aortic valve stenosis initially documented on echo 12/2007.Holosystolic murmur 2/6 auscultated.Echo in January 2019: LVEF [id number], normal systolic function, normal wall motion,  G1DD, mild stenosis noted of aortic valve (mean gradient 14 mmHg), mitral valve with calcified annulus.   [name] Currently stable. Diagnosed with Rai stage 0 [name] in 2014, flow cytometry c/w [name].   PVD H/o of ulcerations and gangrene of L foot, popliteal artery angioplasty L 2014.  Tobacco use Patient has >50 years smoking history. No CT on file.  FEN/GI:NPO Prophylaxis:Lovenox  Disposition: Back to facility pending improvement  Subjective:  Patient unable to provide history this morning, although did try to communicate verbally but I was unable to comprehend.  Objective: Temp:  [98.6 F (37 C)-100.5 F (38.1 C)] 98.9 F (37.2 C) (02/28 0844) Pulse Rate:  [73-86] 74 (02/28 0731) Resp:  [16-20] 16 (02/28 0731) BP: (127-152)/(56-87) 144/56 (02/28 0731) SpO2:  [95 %-100 %] 100 % (02/28 0731) Weight:  [60 kg] 60 kg (02/28 0430) Physical Exam: General: Somnolent, laying in bed.  Mild to moderate distress. Cardiovascular: Harsh decrescendo systolic murmur.  Regular rhythm.  1+ radial pulses bilaterally Respiratory: Lungs clear to auscultation bilaterally.  No crackles or wheezes. Abdomen: Soft, tender to palpation diffusely.  Normal bowel sounds. Extremities: Thin extremities.  No edema.  Laboratory: Recent Labs  Lab 05/28/19 1404 05/29/19 0406 05/30/19 0259  WBC 15.1* 14.6* 12.0*  HGB 9.3* 7.3* 7.4*  HCT 30.2* 23.9* 23.5*  PLT 192 172 177   Recent Labs  Lab 05/28/19 1404 05/29/19 0406 05/30/19 0259  NA 138 142 141  K 5.3* 4.6 4.3  CL 107 113* 116*  CO2 20* 20* 19*  BUN 50* 48* 43*  CREATININE 2.86* 3.09* 2.58*  CALCIUM 8.7* 7.9* 7.9*  PROT 7.3 5.8*  --   BILITOT 0.7 0.4  --   ALKPHOS 87 68  --   ALT 17 12  --   AST 21 18  --   GLUCOSE 196* 105* 129*     Imaging/Diagnostic Tests:   Benay Pike, MD 05/30/2019, 9:35 AM PGY-2, [place] Intern pager: [phone], text pages welcome

## 2019-05-30 NOTE — Progress Notes (Signed)
Once EEG was done, patient had very vigorous left arm repetitive movement that was hitting his forehead and although he was responsive to a sternal rub, he was less responsive than he has been today. Patient had a large bowel movement at this time. I notified MD about findings. I was able to hold his arm and some of the movement would subside. I notified MD of findings. MD who saw patient this am is supposed to come see patient.

## 2019-05-30 NOTE — Procedures (Addendum)
Patient Name: Manuel ZENTENO Sr.  MRN: [id number]  Epilepsy Attending: Lora Havens  Referring Physician/Provider: Dr Milus Banister Date: 05/30/2019 Duration: 23.58 mins  Patient history: 74 Y/O M with PMX of Dementia brought in for a change in his mental status. According to the report, he is becoming more lethargic. In the hospital patient was noted to have left arm weakness and an episode of left arm repetitive movement. EEG to evaluate for seizure.  Level of alertness: awake, asleep  AEDs during EEG study: None  Technical aspects: This EEG study was done with scalp electrodes positioned according to the 10-20 International system of electrode placement. Electrical activity was acquired at a sampling rate of 500Hz  and reviewed with a high frequency filter of 70Hz  and a low frequency filter of 1Hz . EEG data were recorded continuously and digitally stored.   DESCRIPTION: During awake state, no clear posterior dominant rhythm was seen. Sleep was characterized by vertex waves. EEG showed continuous generalized polymorphic 6-9hz  theta-alpha activity. Sharp waves were also noted in right frontocentral ( maximal F4/C4) were also noted.  Hyperventilation and photic stimulation were not performed.  Per tech annotation, patient was noted to have left arm twitching during eeg. However patient was under the sheets to difficult to visualize left arm movement. Concomitant eeg before, during and after the event showed left frontocentral sharp waves but no clear rhythmic/evolving pattern suggestive of seizures was seen.   ABNORMALITY - Sharp waves, right frontocentral region - Continuous slow, generalized  IMPRESSION: This study showed evidence of epileptogenicity in right frontocentral region as well as moderate diffuse encephalopathy, non specific to etiology.   One event of left arm twitching was noted per tech annotation without concomitant eeg change. However, focal motor seizure may not be  seen on scalp eeg. Clinical correlation is recommended.   Dr Gwendlyn Deutscher was notified.   Monserratt Knezevic Barbra Sarks

## 2019-05-30 NOTE — Progress Notes (Signed)
EEG complete - results pending 

## 2019-05-30 NOTE — Progress Notes (Signed)
Brief Note:  No new findings on exam. His left UL improved a lot today. ?? Parkinsonism vs CVA vs Epileptic activity.  MRI brain still not done. EEG ordered. PT/OT when able to participate.  Urine culture: ESBL. Transition to Meropenem.  I will cosign the resident's note once it is complete.

## 2019-05-30 NOTE — Progress Notes (Signed)
MEDICATION RELATED CONSULT NOTE - INITIAL   Pharmacy Consult for Phenytoin Indication: Seizures   No Known Allergies  Patient Measurements: Height: 5\' 8"  (172.7 cm) Weight: 132 lb 4.4 oz (60 kg) IBW/kg (Calculated) : 68.4   Vital Signs: Temp: 98.8 F (37.1 C) (02/28 1200) Temp Source: Rectal (02/28 1200) BP: 143/77 (02/28 1210) Pulse Rate: 71 (02/28 1210) Intake/Output from previous day: 02/27 0701 - 02/28 0700 In: 1951.1 [P.O.:770; I.V.:1181.1] Out: 1500 [Urine:1500] Intake/Output from this shift: No intake/output data recorded.  Labs: Recent Labs    05/28/19 1404 05/29/19 0406 05/30/19 0259  WBC 15.1* 14.6* 12.0*  HGB 9.3* 7.3* 7.4*  HCT 30.2* 23.9* 23.5*  PLT 192 172 177  CREATININE 2.86* 3.09* 2.58*  ALBUMIN 3.0* 2.2*  --   PROT 7.3 5.8*  --   AST 21 18  --   ALT 17 12  --   ALKPHOS 87 68  --   BILITOT 0.7 0.4  --    Estimated Creatinine Clearance: 22 mL/min (A) (by C-G formula based on SCr of 2.58 mg/dL (H)).  Assessment: 74 yo M with hx dementia and baseline non-verbal with new arm twitching, abnormal EEG and worsened mental status.   Baseline LFTs wnl. Currently with AKI on CKD. Loaded with fosphenytoin 20mg /kg per neurologist. Will start lower dose of phenytoin 100mg  Q12 hr given low weight and poor renal function.    Goal of Therapy:  Total phenytoin level 10- 20 mg/L Free phenytoin level 1- 2 mg/L   Plan:  Fosphenytoin 20mg /kg load per MD Start phenytoin 100mg  Q12 hr after load Follow phenytoin trough and albumin in 3-5 days, correct for albumin and renal function  Monitor CBC, LFTs, Renal fx, clinical status   Benetta Spar, PharmD, BCPS, BCCP Clinical Pharmacist  Please check AMION for all [place] phone numbers After 10:00 PM, call [place]

## 2019-05-30 NOTE — Progress Notes (Signed)
Called by MRI to clarify if pt has penile implant. Pt verified but unable to tell if its compatible with MRI. Dr Kris Mouton informed. Will f/u with AM shift to get in contact with family member.

## 2019-05-31 DIAGNOSIS — R569 Unspecified convulsions: Secondary | ICD-10-CM

## 2019-05-31 LAB — CBC
HCT: 25.1 % — ABNORMAL LOW (ref 39.0–52.0)
Hemoglobin: 7.8 g/dL — ABNORMAL LOW (ref 13.0–17.0)
MCH: 32.2 pg (ref 26.0–34.0)
MCHC: 31.1 g/dL (ref 30.0–36.0)
MCV: 103.7 fL — ABNORMAL HIGH (ref 80.0–100.0)
Platelets: 178 10*3/uL (ref 150–400)
RBC: 2.42 MIL/uL — ABNORMAL LOW (ref 4.22–5.81)
RDW: 14.6 % (ref 11.5–15.5)
WBC: 12.9 10*3/uL — ABNORMAL HIGH (ref 4.0–10.5)
nRBC: 0.2 % (ref 0.0–0.2)

## 2019-05-31 LAB — COMPREHENSIVE METABOLIC PANEL
ALT: 12 U/L (ref 0–44)
AST: 16 U/L (ref 15–41)
Albumin: 2.1 g/dL — ABNORMAL LOW (ref 3.5–5.0)
Alkaline Phosphatase: 66 U/L (ref 38–126)
Anion gap: 11 (ref 5–15)
BUN: 34 mg/dL — ABNORMAL HIGH (ref 8–23)
CO2: 19 mmol/L — ABNORMAL LOW (ref 22–32)
Calcium: 8.1 mg/dL — ABNORMAL LOW (ref 8.9–10.3)
Chloride: 115 mmol/L — ABNORMAL HIGH (ref 98–111)
Creatinine, Ser: 2.15 mg/dL — ABNORMAL HIGH (ref 0.61–1.24)
GFR calc Af Amer: 34 mL/min — ABNORMAL LOW (ref >60)
GFR calc non Af Amer: 30 mL/min — ABNORMAL LOW (ref >60)
Glucose, Bld: 120 mg/dL — ABNORMAL HIGH (ref 70–99)
Potassium: 4.2 mmol/L (ref 3.5–5.1)
Sodium: 145 mmol/L (ref 135–145)
Total Bilirubin: 0.6 mg/dL (ref 0.3–1.2)
Total Protein: 5.6 g/dL — ABNORMAL LOW (ref 6.5–8.1)

## 2019-05-31 LAB — GLUCOSE, CAPILLARY
Glucose-Capillary: 112 mg/dL — ABNORMAL HIGH (ref 70–99)
Glucose-Capillary: 107 mg/dL — ABNORMAL HIGH (ref 70–99)
Glucose-Capillary: 102 mg/dL — ABNORMAL HIGH (ref 70–99)
Glucose-Capillary: 94 mg/dL (ref 70–99)

## 2019-05-31 LAB — CULTURE, BLOOD (ROUTINE X 2)

## 2019-05-31 MED ORDER — LORAZEPAM 2 MG/ML IJ SOLN: 2.0000 mg | INTRAMUSCULAR | Status: DC | PRN

## 2019-05-31 NOTE — Progress Notes (Signed)
Family Medicine Teaching Service Daily Progress Note Intern Pager: [phone]  Patient name: Manuel Randolph. Medical record number: [id number] Date of birth: 1945/08/24 Age: 74 y.o. Gender: male  Primary Care Provider: System, Pcp Not In Consultants: None Code Status: Full  Pt Overview and Major Events to Date:  2/26-admitted 2/27-E. coli positive.  Ceftriaxone started 2/28-ESBL identified.  Meropenem started per pharmacy consult  Assessment and Plan: Manuel Randolph.is a 74 y.o.malepresenting with AMS and sepsis. PMH is significant fordementia, DMT2, HTN, [name], PVD, AVM of small intestine, CKD, h/o urinary retention, penile implant.  AMS  UroSepsis w/ E.coli Bacteremia Patient still minimally responsive.  Was able to try to speak but was mostly unintelligible this morning.  Urine culture and blood culture positive for E.coli.  Urine Cx growing ESBL E. Coli. RPR, HIV negative.  Curbside ID regarding management for ESBL.  Stated we can start a Carbapenem.  Did not have a preference.  Recommended 7 days minimum IV treatment.  Daughter was worried about patient's mental status.  MRI brain and EEG were ordered to evaluate for stroke/seizure.  MRI showed no evidence of acute intracranial abnormality.  Chronic small vessel ischemia with numerous chronic lacunar infarcts.  Multiple chronic microhemorrhages likely secondary to chronic hypertension.   -Start meropenem.  (2/28-1/6) - Abx: cefepime, flagyl, vanc (2/26), CTX (2/27 - 2/28).   -continuous cardiac monitoring, pulse ox -EEG showed right frontocentral sharp waves so the patient was loaded with fosphenytoin and then started on maintenance phenytoin -NS IVF @ 177mL/hr -Soft diet -We will need PT/OT when pain is proved and SLP for swallow eval  Hx of COVID infection COVID-19 now positive but pt testedCOVID-19 positive on 04/28/19 (not in our system), received vaccines on 1/22 and 2/19, out of 21 day window and will likely  continue to test COVID positive.  - see infection prevention note  Penile Implant  H/O urinary retention  Peyronie's disease  HSV Patient with h/o of ED and peyronie's disease, had an inflatable penile prosthesis placed in 11/2012. Home medications flomax 0.4 mg, and valtrex. Patient also has a h/o HSV, currently ulcer presentjust proximal to the dorsal side ofglans of penis.  - will need to know if penile implant is MRI compatible before head MRI.  - Referral to urology to consider removal of penile implant after multiple UTIs and uroseptic events    DM2 with peripheral neuropathy Home medications include Insulin lispro 3U TID and hold for meal time BGL 100 or less, lantus 12 U daily. Gabapentin 300 mg TID (renal dosing). -sliding scale insulin with CBG q4h -hold home meds, gabapentin held d/t NPO  AKI onCKD Stage 3 Baseline cr in January 2020 around 1.7, on 2/18. 2.86>3.09>2.58>2.15. BUN 43.  Likely dehydration.  -Avoid nephrotoxic agents as much as possible -IVF NS @ 125 mL/hr -AM BMP   HTN Normotensive today Home medications include amlodipine 10mg , hydralazine 75 mg TID. -NPO currently, home medications heldin setting of sepsis.  HLD Home medications include ASA 81 mg, atorvastatin 10 mg. -NPO, home medications held  Dementia Multiple chronic lacunar infarcts on CT head. Home medication paxil 40 mg, seroquel 25 mg qhs. -NPO currently, hold home medications -Consider palliative consult and discussion of CODE STATUS with daughter given his poor baseline mental status.  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 Baseline hgb around 9-10. Hgb 7.4 this AM. -Type and screen; consider transfusing  Aortic Valvestenosis Mild aortic valve stenosis initially documented on echo 12/2007.Holosystolic murmur 2/6 auscultated.Echo in January 2019: LVEF  [id number], normal systolic function, normal wall motion, G1DD, mild stenosis noted of aortic valve (mean gradient 14 mmHg), mitral valve with calcified annulus.   [name] Currently stable. Diagnosed with Rai stage 0 [name] in 2014, flow cytometry c/w [name].   PVD H/o of ulcerations and gangrene of L foot, popliteal artery angioplasty L 2014.  Tobacco use Patient has >50 years smoking history. No CT on file.  FEN/GI:NPO Prophylaxis:Lovenox  Disposition: Back to facility pending improvement  Subjective:  Patient is nonresponsive in room.  Eyes closed with upper extremities mildly contracted.  Objective: Temp:  [97.6 F (36.4 C)-98.9 F (37.2 C)] 98.4 F (36.9 C) (03/01 0342) Pulse Rate:  [65-74] 71 (03/01 0342) Resp:  [13-19] 13 (03/01 0342) BP: (92-144)/(56-77) 92/59 (03/01 0342) SpO2:  [99 %-100 %] 99 % (03/01 0342) Weight:  [59.2 kg] 59.2 kg (03/01 0342) Physical Exam: General: Laying in bed on right side.  Upper extremities are contracted. Cardiovascular: Harsh decrescendo systolic murmur.  Regular rhythm.  1+ radial pulses bilaterally Respiratory: Lungs clear to auscultation bilaterally.  No crackles or wheezes. Abdomen: Soft, tender to palpation diffusely.  Normal bowel sounds. Extremities: Thin extremities.  No edema.  Laboratory: Recent Labs  Lab 05/29/19 0406 05/30/19 0259 05/31/19 0313  WBC 14.6* 12.0* 12.9*  HGB 7.3* 7.4* 7.8*  HCT 23.9* 23.5* 25.1*  PLT 172 177 178   Recent Labs  Lab 05/28/19 1404 05/28/19 1404 05/29/19 0406 05/30/19 0259 05/31/19 0313  NA 138   < > 142 141 145  K 5.3*   < > 4.6 4.3 4.2  CL 107   < > 113* 116* 115*  CO2 20*   < > 20* 19* 19*  BUN 50*   < > 48* 43* 34*  CREATININE 2.86*   < > 3.09* 2.58* 2.15*  CALCIUM 8.7*   < > 7.9* 7.9* 8.1*  PROT 7.3  --  5.8*  --  5.6*  BILITOT 0.7  --  0.4  --  0.6  ALKPHOS 87  --  68  --  66  ALT 17  --  12  --  12  AST 21  --  18  --  16  GLUCOSE 196*   < > 105* 129* 120*   < > = values  in this interval not displayed.     Imaging/Diagnostic Tests: MR BRAIN WO CONTRAST  Result Date: 05/30/2019 CLINICAL DATA:  Altered mental status. EXAM: MRI HEAD WITHOUT CONTRAST TECHNIQUE: Multiplanar, multiecho pulse sequences of the brain and surrounding structures were obtained without intravenous contrast. COMPARISON:  Head CT 05/28/2019 FINDINGS: The study is intermittently moderately to severely motion degraded despite the use of motion resistant imaging protocols. Brain: There is no evidence of acute infarct, mass, midline shift, or extra-axial fluid collection. Chronic hemorrhages are present in the cerebellum, brainstem, and deep gray nuclei. Moderate T2 hyperintensities in the cerebral white matter and pons are nonspecific but compatible with chronic small vessel ischemia. Chronic lacunar infarcts are present in the deep cerebral white matter, basal ganglia, and thalami bilaterally as well as in the pons. There is moderate cerebral atrophy. Vascular: Major intracranial vascular flow voids are grossly preserved. Skull and upper cervical spine: Unremarkable bone marrow signal. Sinuses/Orbits: Bilateral cataract extraction. Mild mucosal thickening in the paranasal sinuses. Trace bilateral mastoid effusions. Other: None. IMPRESSION: 1. Motion degraded examination without evidence of acute intracranial abnormality. 2. Chronic  small vessel ischemia with numerous chronic lacunar infarcts. 3. Multiple chronic microhemorrhages likely secondary to chronic hypertension. Electronically Signed   By: Logan Bores M.D.   On: 05/30/2019 19:11   EEG adult  Result Date: 05/30/2019 Lora Havens, MD     05/30/2019  6:16 PM Patient Name: Amalia Greenhouse Randolph. MRN: [id number] Epilepsy Attending: Lora Havens Referring Physician/Provider: Dr Milus Banister Date: 05/30/2019 Duration: 23.58 mins Patient history: 74 Y/O M with PMX of Dementia brought in for a change in his mental status. According to the report,  he is becoming more lethargic. In the hospital patient was noted to have left arm weakness and an episode of left arm repetitive movement. EEG to evaluate for seizure. Level of alertness: awake, asleep AEDs during EEG study: None Technical aspects: This EEG study was done with scalp electrodes positioned according to the 10-20 International system of electrode placement. Electrical activity was acquired at a sampling rate of 500Hz  and reviewed with a high frequency filter of 70Hz  and a low frequency filter of 1Hz . EEG data were recorded continuously and digitally stored. DESCRIPTION: During awake state, no clear posterior dominant rhythm was seen. Sleep was characterized by vertex waves. EEG showed continuous generalized polymorphic 6-9hz  theta-alpha activity. Sharp waves were also noted in right frontocentral ( maximal F4/C4) were also noted.  Hyperventilation and photic stimulation were not performed. Per tech annotation, patient was noted to have left arm twitching during eeg. However patient was under the sheets to difficult to visualize left arm movement. Concomitant eeg before, during and after the event showed left frontocentral sharp waves but no clear rhythmic/evolving pattern suggestive of seizures was seen. ABNORMALITY - Sharp waves, right frontocentral region - Continuous slow, generalized IMPRESSION: This study showed evidence of epileptogenicity in right frontocentral region as well as moderate diffuse encephalopathy, non specific to etiology. One event of left arm twitching was noted per tech annotation without concomitant eeg change. However, focal motor seizure may not be seen on scalp eeg. Clinical correlation is recommended. Dr Gwendlyn Deutscher was notified. Priyanka Carolanne Grumbling, MD 05/31/2019, 5:29 AM PGY-1, [place] Intern pager: [phone], text pages welcome

## 2019-05-31 NOTE — Progress Notes (Addendum)
Subjective: No acute events overnight.  Per RN, she has not witnessed any definite tonic-clonic seizure-like activity.  However patient continues to be laying in bed and not really communicative.  ROS: Unable to obtain due to poor mental status  Examination  Vital signs in last 24 hours: Temp:  [97.6 F (36.4 C)-99.6 F (37.6 C)] 99.6 F (37.6 C) (03/01 1117) Pulse Rate:  [65-73] 71 (03/01 1117) Resp:  [13-22] 13 (03/01 1117) BP: (92-146)/(51-73) 117/51 (03/01 1117) SpO2:  [99 %-100 %] 99 % (03/01 1117) Weight:  [59.2 kg] 59.2 kg (03/01 0342)  General: lying in bed, does not appear to be in any apparent distress CVS: pulse-normal rate and rhythm RS: breathing comfortably, CTA B Extremities: normal, warm  Neuro: MS: Awake, turned to the right side and laying in fetal position, does not follow commands, does not make eye contact with examiner CN: Pupils appear equal and reactive to light, no forced gaze deviation, patient appears to be blinking but does not necessarily always blink to threat, difficult to assess facial symmetry as patient is lying on his side and resistant to move  Motor: Appears to have increased tone in bilateral upper extremities but unclear how much of this is voluntary, does appear to be voluntarily moving all 4 extremities purposefully with antigravity strength, tremor-like movement noted in left upper extremity  Basic Metabolic Panel: Recent Labs  Lab 05/28/19 1404 05/28/19 1404 05/29/19 0406 05/30/19 0259 05/31/19 0313  NA 138  --  142 141 145  K 5.3*  --  4.6 4.3 4.2  CL 107  --  113* 116* 115*  CO2 20*  --  20* 19* 19*  GLUCOSE 196*  --  105* 129* 120*  BUN 50*  --  48* 43* 34*  CREATININE 2.86*  --  3.09* 2.58* 2.15*  CALCIUM 8.7*   < > 7.9* 7.9* 8.1*   < > = values in this interval not displayed.    CBC: Recent Labs  Lab 05/28/19 1404 05/29/19 0406 05/30/19 0259 05/31/19 0313  WBC 15.1* 14.6* 12.0* 12.9*  NEUTROABS 12.1*  --   --   --    HGB 9.3* 7.3* 7.4* 7.8*  HCT 30.2* 23.9* 23.5* 25.1*  MCV 105.2* 102.1* 102.2* 103.7*  PLT 192 172 177 178     Coagulation Studies: Recent Labs    05/29/19 0406  LABPROT 17.5*  INR 1.5*    Imaging MRI brain without contrast 05/30/2019: 1. Motion degraded examination without evidence of acute intracranial abnormality. 2. Chronic small vessel ischemia with numerous chronic lacunar infarcts. 3. Multiple chronic microhemorrhages likely secondary to chronic hypertension.   ASSESSMENT AND PLAN: 74 year old male with advanced dementia (at baseline nonverbal and only moans) who presented on 05/28/2019 with altered mental status and was diagnosed with urosepsis currently on broad-spectrum antibiotics.  Yesterday was noted to have some left arm twitching.  EEG was obtained which showed some right frontocentral sharp waves.  Therefore patient was loaded with IV fosphenytoin and started on maintenance fosphenytoin.   Acute infectious encephalopathy Concern for seizures -Patient most likely has multifactorial encephalopathy in setting of advanced dementia, UTI, multiple medications, possible seizures and antiepileptic drug -Phenytoin level yesterday was less than 2.5.  However level was drawn at 1516 even though the loading dose was given at 1543 per EMR  Recommendations -We will check phenytoin level before tomorrow morning dose -Continue phenytoin 100 mg twice daily at this point as no clinical seizure-like activity noted on exam today.  If needed  can increase Dilantin dose or add second AED like Keppra -Dr. Rory Percy had detailed conversation with patient's daughter yesterday regarding goals of care, please refer to his note for further details -Continue seizure precautions, minimize sedating medications, delirium precautions - As needed IV Ativan for generalized tonic-clonic seizure lasting with the treatments of a continuous tremor than 5 minutes   Thank you for allowing [place] to part spent  care of this patient.  Neurology will follow.  Please page neuro hospitalist for any further questions after 5 PM.  Nile Dorning Barbra Sarks

## 2019-05-31 NOTE — Plan of Care (Signed)

## 2019-06-01 DIAGNOSIS — Z66 Do not resuscitate: Secondary | ICD-10-CM

## 2019-06-01 DIAGNOSIS — R627 Adult failure to thrive: Secondary | ICD-10-CM

## 2019-06-01 DIAGNOSIS — Z515 Encounter for palliative care: Secondary | ICD-10-CM

## 2019-06-01 LAB — BASIC METABOLIC PANEL
Anion gap: 8 (ref 5–15)
BUN: 25 mg/dL — ABNORMAL HIGH (ref 8–23)
CO2: 19 mmol/L — ABNORMAL LOW (ref 22–32)
Calcium: 8.1 mg/dL — ABNORMAL LOW (ref 8.9–10.3)
Chloride: 119 mmol/L — ABNORMAL HIGH (ref 98–111)
Creatinine, Ser: 1.93 mg/dL — ABNORMAL HIGH (ref 0.61–1.24)
GFR calc Af Amer: 39 mL/min — ABNORMAL LOW (ref >60)
GFR calc non Af Amer: 34 mL/min — ABNORMAL LOW (ref >60)
Glucose, Bld: 86 mg/dL (ref 70–99)
Potassium: 4 mmol/L (ref 3.5–5.1)
Sodium: 146 mmol/L — ABNORMAL HIGH (ref 135–145)

## 2019-06-01 LAB — GLUCOSE, CAPILLARY
Glucose-Capillary: 98 mg/dL (ref 70–99)
Glucose-Capillary: 118 mg/dL — ABNORMAL HIGH (ref 70–99)

## 2019-06-01 LAB — PHENYTOIN LEVEL, TOTAL: Phenytoin Lvl: 13.4 ug/mL (ref 10.0–20.0)

## 2019-06-01 LAB — CBC
HCT: 25.1 % — ABNORMAL LOW (ref 39.0–52.0)
Hemoglobin: 7.7 g/dL — ABNORMAL LOW (ref 13.0–17.0)
MCH: 31 pg (ref 26.0–34.0)
MCHC: 30.7 g/dL (ref 30.0–36.0)
MCV: 101.2 fL — ABNORMAL HIGH (ref 80.0–100.0)
Platelets: 205 10*3/uL (ref 150–400)
RBC: 2.48 MIL/uL — ABNORMAL LOW (ref 4.22–5.81)
RDW: 14.5 % (ref 11.5–15.5)
WBC: 11.9 10*3/uL — ABNORMAL HIGH (ref 4.0–10.5)
nRBC: 0 % (ref 0.0–0.2)

## 2019-06-01 MED ORDER — MORPHINE SULFATE (CONCENTRATE) 10 MG/0.5ML PO SOLN
5.0000 mg | Freq: Four times a day (QID) | ORAL | Status: DC
  Administered 2019-06-01 – 2019-06-03 (×9): 5 mg via ORAL
  Filled 2019-06-01 (×9): qty 0.5

## 2019-06-01 MED ORDER — ASPIRIN 81 MG PO CHEW
81.0000 mg | CHEWABLE_TABLET | Freq: Every day | ORAL | Status: DC
  Administered 2019-06-01: 81 mg via ORAL
  Filled 2019-06-01: qty 1

## 2019-06-01 MED ORDER — PHENYTOIN SODIUM EXTENDED 100 MG PO CAPS
100.0000 mg | ORAL_CAPSULE | Freq: Two times a day (BID) | ORAL | Status: DC
  Administered 2019-06-02: 100 mg via ORAL
  Filled 2019-06-01 (×7): qty 1

## 2019-06-01 MED ORDER — AMLODIPINE BESYLATE 10 MG PO TABS
10.0000 mg | ORAL_TABLET | Freq: Every day | ORAL | Status: DC
  Administered 2019-06-01: 10 mg via ORAL
  Filled 2019-06-01: qty 1

## 2019-06-01 NOTE — Progress Notes (Addendum)
Subjective: No acute events overnight.  Per RN, patient was noted have bilateral left more than right arm tremors yesterday.  Has been very awake, no clinical seizure-like episodes noted.  ROS: Unable to obtain due to poor mental status  Examination  Vital signs in last 24 hours: Temp:  [98.6 F (37 C)-99.6 F (37.6 C)] 99.4 F (37.4 C) (03/02 0421) Pulse Rate:  [65-99] 65 (03/02 1000) Resp:  [12-27] 19 (03/02 1000) BP: (117-158)/(51-71) 158/60 (03/02 0421) SpO2:  [98 %-100 %] 100 % (03/02 1000) Weight:  [56.4 kg] 56.4 kg (03/02 0421)  General: lying in bed, not in apparent distress CVS: pulse-normal rate and rhythm RS: breathing comfortably, CTA B Extremities: normal, warm  Neuro: MS: Awake, alert, looked at me when I walked in but then did not track me in the room, did not follow commands CN: pupils equal and reactive, appears to voluntarily move eyes around the room, blinks to threat bilaterally, no apparent facial asymmetry  Motor: Mildly increased tone in bilateral upper extremities which could be voluntary, going all 4 extremities spontaneously   Basic Metabolic Panel: Recent Labs  Lab 05/28/19 1404 05/28/19 1404 05/29/19 0406 05/29/19 0406 05/30/19 0259 05/31/19 0313 06/01/19 0331  NA 138  --  142  --  141 145 146*  K 5.3*  --  4.6  --  4.3 4.2 4.0  CL 107  --  113*  --  116* 115* 119*  CO2 20*  --  20*  --  19* 19* 19*  GLUCOSE 196*  --  105*  --  129* 120* 86  BUN 50*  --  48*  --  43* 34* 25*  CREATININE 2.86*  --  3.09*  --  2.58* 2.15* 1.93*  CALCIUM 8.7*   < > 7.9*   < > 7.9* 8.1* 8.1*   < > = values in this interval not displayed.    CBC: Recent Labs  Lab 05/28/19 1404 05/29/19 0406 05/30/19 0259 05/31/19 0313 06/01/19 0456  WBC 15.1* 14.6* 12.0* 12.9* 11.9*  NEUTROABS 12.1*  --   --   --   --   HGB 9.3* 7.3* 7.4* 7.8* 7.7*  HCT 30.2* 23.9* 23.5* 25.1* 25.1*  MCV 105.2* 102.1* 102.2* 103.7* 101.2*  PLT 192 172 177 178 205     Coagulation  Studies: No results for input(s): LABPROT, INR in the last 72 hours.  Imaging MRI brain without contrast 05/30/2019: 1. Motion degraded examination without evidence of acute intracranial abnormality. 2. Chronic small vessel ischemia with numerous chronic lacunar infarcts. 3. Multiple chronic microhemorrhages likely secondary to chronic hypertension.   ASSESSMENT AND PLAN: 74 year old male with advanced dementia (at baseline nonverbal and only moans) who presented on 05/28/2019 with altered mental status and was diagnosed with urosepsis currently on broad-spectrum antibiotics.  Yesterday was noted to have some left arm twitching.  EEG was obtained which showed some right frontocentral sharp waves.  Therefore patient was loaded with IV fosphenytoin and started on maintenance fosphenytoin.   Acute infectious encephalopathy Chronic lacunar infarcts Suspected new onset seizures -Patient most likely has multifactorial encephalopathy in setting of advanced dementia, UTI, multiple medications, possible seizures and antiepileptic drug -Seizures could be provoked secondary to infection.  However patient is demented which itself is a risk factor for epilepsy. -Initial phenytoin level yesterday was less than 2.5.  However level was drawn at 1516 even though the loading dose was given at 1543 per EMR.  Repeat phenytoin level this morning was 13.4.  Recommendations -Continue phenytoin 100 mg twice daily at this point as no clinical seizure-like activity.  We will transition from IV to p.o. - If patient has any further breakthrough seizures can increase Dilantin dose or add second AED like Keppra -Patient has chronic infarcts.  Recommend starting aspirin 81 mg daily for secondary stroke prevention.  As these are chronic infarcts and patient is already bedbound with advanced dementia, will defer further stroke work-up at this point. -Dr. Rory Percy had detailed conversation with patient's daughter over the  weekend regarding goals of care, please refer to his note for further details -Continue seizure precautions, minimize sedating medications, delirium precautions - As needed IV Ativan for generalized tonic-clonic seizure lasting more than 2 minutes or focal seizure lasting more than 5 minutes while inpatient -Patient appears to be close to his baseline from neurologic standpoint.  Management of rest of the comorbidities per primary team -Neurology follow-up in 8 to 12 weeks after discharge.  Can consider work-up for chronic lacunar infarcts at that point   Thank you for allowing [place] to part spent care of this patient.  Neurology will sign off. Please page neuro hospitalist for any further questions after 5 PM.  I have spent a total of 25 minutes with the patient reviewing hospital notes,  test results, labs and examining the patient as well as establishing an assessment and plan that was discussed personally with the patient's team.  > 50% of time was spent in direct patient care.       Manuel Randolph Barbra Sarks

## 2019-06-01 NOTE — Plan of Care (Signed)
  Problem: Education: Goal: Knowledge of General Education information will improve Description: Including pain rating scale, medication(s)/side effects and non-pharmacologic comfort measures 06/01/2019 0710 by Camillia Herter, RN Outcome: Progressing 06/01/2019 0710 by Camillia Herter, RN Outcome: Progressing   Problem: Clinical Measurements: Goal: Ability to maintain clinical measurements within normal limits will improve 06/01/2019 0710 by Camillia Herter, RN Outcome: Progressing 06/01/2019 0710 by Camillia Herter, RN Outcome: Progressing Goal: Will remain free from infection 06/01/2019 0710 by Camillia Herter, RN Outcome: Progressing 06/01/2019 0710 by Camillia Herter, RN Outcome: Progressing Goal: Diagnostic test results will improve 06/01/2019 0710 by Camillia Herter, RN Outcome: Progressing 06/01/2019 0710 by Camillia Herter, RN Outcome: Progressing Goal: Respiratory complications will improve 06/01/2019 0710 by Camillia Herter, RN Outcome: Progressing 06/01/2019 0710 by Camillia Herter, RN Outcome: Progressing Goal: Cardiovascular complication will be avoided 06/01/2019 0710 by Camillia Herter, RN Outcome: Progressing 06/01/2019 0710 by Camillia Herter, RN Outcome: Progressing   Problem: Activity: Goal: Risk for activity intolerance will decrease 06/01/2019 0710 by Camillia Herter, RN Outcome: Progressing 06/01/2019 0710 by Camillia Herter, RN Outcome: Progressing   Problem: Nutrition: Goal: Adequate nutrition will be maintained 06/01/2019 0710 by Camillia Herter, RN Outcome: Progressing 06/01/2019 0710 by Camillia Herter, RN Outcome: Progressing   Problem: Coping: Goal: Level of anxiety will decrease 06/01/2019 0710 by Camillia Herter, RN Outcome: Progressing 06/01/2019 0710 by Camillia Herter, RN Outcome: Progressing   Problem: Elimination: Goal: Will not experience complications related to bowel motility 06/01/2019 0710 by Camillia Herter, RN Outcome: Progressing 06/01/2019 0710 by Camillia Herter,  RN Outcome: Progressing Goal: Will not experience complications related to urinary retention 06/01/2019 0710 by Camillia Herter, RN Outcome: Progressing 06/01/2019 0710 by Camillia Herter, RN Outcome: Progressing   Problem: Pain Managment: Goal: General experience of comfort will improve 06/01/2019 0710 by Camillia Herter, RN Outcome: Progressing 06/01/2019 0710 by Camillia Herter, RN Outcome: Progressing   Problem: Safety: Goal: Ability to remain free from injury will improve 06/01/2019 0710 by Camillia Herter, RN Outcome: Progressing 06/01/2019 0710 by Camillia Herter, RN Outcome: Progressing   Problem: Skin Integrity: Goal: Risk for impaired skin integrity will decrease 06/01/2019 0710 by Camillia Herter, RN Outcome: Progressing 06/01/2019 0710 by Camillia Herter, RN Outcome: Progressing

## 2019-06-01 NOTE — Progress Notes (Signed)
Family Medicine Teaching Service Daily Progress Note Intern Pager: [phone]  Patient name: Manuel Randolph. Medical record number: [id number] Date of birth: Dec 20, 1945 Age: 74 y.o. Gender: male  Primary Care Provider: System, Pcp Not In Consultants: None Code Status: Full  Pt Overview and Major Events to Date:  2/26-admitted 2/27-E. coli positive.  Ceftriaxone started 2/28-ESBL identified.  Meropenem started per pharmacy consult  Assessment and Plan: Manuel [name] a 74 y.o.malepresenting with AMS and sepsis. PMH is significant fordementia, DMT2, HTN, [name], PVD, AVM of small intestine, CKD, h/o urinary retention, penile implant.  AMS  UroSepsis w/ E.coli Bacteremia Patient still minimally responsive.  Was able to try to speak but was mostly unintelligible this morning.  Urine culture and blood culture positive for E.coli.  Urine Cx growing ESBL E. Coli. RPR, HIV negative.  Curbside ID regarding management for ESBL.  Stated we can start a Carbapenem.  Did not have a preference.  Recommended 7 days minimum IV treatment.  Daughter was worried about patient's mental status.  MRI brain and EEG were ordered to evaluate for stroke/seizure.  MRI showed no evidence of acute intracranial abnormality.  Chronic small vessel ischemia with numerous chronic lacunar infarcts.  Multiple chronic microhemorrhages likely secondary to chronic hypertension.  WBCs 12.9 >11.9 -Start meropenem.  (2/28-1/6) - Abx: cefepime, flagyl, vanc (2/26), CTX (2/27 - 2/28).   -continuous cardiac monitoring, pulse ox -EEG showed right frontocentral sharp waves so the patient was loaded with fosphenytoin and then started on maintenance phenytoin -Phenytoin level this morning was 13.4 -NS IVF @ 191mL/hr -Soft diet -We will need PT/OT when pain is proved and SLP for swallow eval  Hx of COVID infection COVID-19 now positive but pt testedCOVID-19 positive on 04/28/19 (not in our system), received vaccines on  1/22 and 2/19, out of 21 day window and will likely continue to test COVID positive.  - see infection prevention note  Penile Implant  H/O urinary retention  Peyronie's disease  HSV Patient with h/o of ED and peyronie's disease, had an inflatable penile prosthesis placed in 11/2012. Home medications flomax 0.4 mg, and valtrex. Patient also has a h/o HSV, currently ulcer presentjust proximal to the dorsal side ofglans of penis.  -Implant is MRI compatible and patient had MRI - Referral to urology to consider removal of penile implant after multiple UTIs and uroseptic events    DM2 with peripheral neuropathy Home medications include Insulin lispro 3U TID and hold for meal time BGL 100 or less, lantus 12 U daily. Gabapentin 300 mg TID (renal dosing). -sliding scale insulin with CBG q4h -hold home meds, gabapentin held d/t NPO  AKI onCKD Stage 3 Baseline cr in January 2020 around 1.7, on 2/18. 2.86>3.09>2.58>2.15>1.93. BUN 43.  Likely dehydration.  -Avoid nephrotoxic agents as much as possible -IVF NS @ 100 mL/hr -AM BMP   HTN Hypertensive today.  Home medications include amlodipine 10mg , hydralazine 75 mg TID. -Given patient is now on soft diet will restart amlodipine 10 mg -Holding hydralazine at this time  HLD Home medications include ASA 81 mg, atorvastatin 10 mg. -Holding home meds at this time  Dementia Multiple chronic lacunar infarcts on CT head. Home medication paxil 40 mg, seroquel 25 mg qhs. -NPO currently, hold home medications -Consider palliative consult and discussion of CODE STATUS with daughter given his poor baseline mental status.  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 Baseline  hgb around 9-10. Hgb 7.4 this AM. -Type and screen; consider transfusing  Aortic Valvestenosis Mild aortic valve stenosis initially documented on echo  12/2007.Holosystolic murmur 2/6 auscultated.Echo in January 2019: LVEF [id number], normal systolic function, normal wall motion, G1DD, mild stenosis noted of aortic valve (mean gradient 14 mmHg), mitral valve with calcified annulus.   [name] Currently stable. Diagnosed with Rai stage 0 [name] in 2014, flow cytometry c/w [name].   PVD H/o of ulcerations and gangrene of L foot, popliteal artery angioplasty L 2014.  Tobacco use Patient has >50 years smoking history. No CT on file.  FEN/GI:NPO Prophylaxis:Lovenox  Disposition: Back to facility pending improvement  Subjective:  Patient appears well this morning.  He is still nonresponsive but he is awake on this exam in comparison to previous exams where he has been unconscious.  He is drinking fluids with the assistance of the care partner when I evaluate.  He is also reportedly had some soft foods.  Objective: Temp:  [98.6 F (37 C)-99.6 F (37.6 C)] 99.4 F (37.4 C) (03/02 0421) Pulse Rate:  [66-99] 66 (03/02 0421) Resp:  [12-22] 22 (03/02 0421) BP: (117-158)/(51-71) 158/60 (03/02 0421) SpO2:  [99 %-100 %] 99 % (03/02 0421) Weight:  [56.4 kg] 56.4 kg (03/02 0421) Physical Exam: General: Sitting up, looking around the room, no acute distress Cardiovascular: Harsh decrescendo systolic murmur.  Regular rhythm.  1+ radial pulses bilaterally Respiratory: Lungs clear to auscultation bilaterally.  No crackles or wheezes. Abdomen: Soft, tender to palpation diffusely.  Normal bowel sounds. Extremities: Thin extremities.  No edema.  Laboratory: Recent Labs  Lab 05/30/19 0259 05/31/19 0313 06/01/19 0456  WBC 12.0* 12.9* 11.9*  HGB 7.4* 7.8* 7.7*  HCT 23.5* 25.1* 25.1*  PLT 177 178 205   Recent Labs  Lab 05/28/19 1404 05/28/19 1404 05/29/19 0406 05/29/19 0406 05/30/19 0259 05/31/19 0313 06/01/19 0331  NA 138   < > 142   < > 141 145 146*  K 5.3*   < > 4.6   < > 4.3 4.2 4.0  CL 107   < > 113*   < > 116* 115* 119*  CO2 20*   <  > 20*   < > 19* 19* 19*  BUN 50*   < > 48*   < > 43* 34* 25*  CREATININE 2.86*   < > 3.09*   < > 2.58* 2.15* 1.93*  CALCIUM 8.7*   < > 7.9*   < > 7.9* 8.1* 8.1*  PROT 7.3  --  5.8*  --   --  5.6*  --   BILITOT 0.7  --  0.4  --   --  0.6  --   ALKPHOS 87  --  68  --   --  66  --   ALT 17  --  12  --   --  12  --   AST 21  --  18  --   --  16  --   GLUCOSE 196*   < > 105*   < > 129* 120* 86   < > = values in this interval not displayed.     Imaging/Diagnostic Tests: No results found.   Gifford Shave, MD 06/01/2019, 8:05 AM PGY-1, [place] Intern pager: [phone], text pages welcome

## 2019-06-01 NOTE — Consult Note (Signed)
Consultation Note Date: 06/01/2019   Patient Name: Manuel Randolph  DOB: 1945/05/06  MRN: [id number]  Age / Sex: 74 y.o., male  PCP: System, Pcp Not In Referring Physician: Kinnie Feil, MD  Reason for Consultation: Establishing goals of care and Psychosocial/spiritual support  HPI/Patient Profile: 74 y.o. male  admitted on 05/28/2019 with  PMH significant for dementia, DMT2, HTN, [name], PVD, AVM of small intestine, CKD, h/o urinary retention, penile implant.  Manuel Randolph  MRI brain without contrast 05/30/2019: 1. Motion degraded examination without evidence of acute intracranial abnormality. 2. Chronic small vessel ischemia with numerous chronic lacunar infarcts. 3. Multiple chronic microhemorrhages likely secondary to chronic hypertension.  Family face treatment option decisions, advanced directive decisions and anticipatory care needs.  Clinical Assessment and Goals of Care:   This NP Manuel Randolph reviewed medical records, received report from team, assessed the patient and then meet at the patient's bedside along with his daughter Manuel Randolph and son/ Manuel Randolph to discuss diagnosis, prognosis, GOC, EOL wishes disposition and options.  Concept of Hospice and Palliative Care were discussed  A detailed discussion was had today regarding advanced directives.  Concepts specific to code status, artifical feeding and hydration, continued IV antibiotics and rehospitalization was had.  The difference between a aggressive medical intervention path  and a palliative comfort care path for this patient at this time was had.  Values and goals of care important to patient and family were attempted to be elicited.  MOST form introduced, Hard Choices left for review  Natural trajectory and expectations at EOL were discussed.  Questions and concerns addressed.   Family encouraged to call with questions or concerns.     PMT will continue to support holistically.   NEXT OF KIN    SUMMARY OF RECOMMENDATIONS    Code Status/Advance Care Planning:  DNR  Symptom management:  *Generalized underlying pain: Roxanol 5 mg sublingual/p.o. scheduled every 6 hours   Palliative Prophylaxis:   Aspiration, Bowel Regimen, Delirium Protocol, Frequent Pain Assessment and Oral Care  Additional Recommendations (Limitations, Scope, Preferences):  Full comfort path  Focus of care is comfort, quality and dignity  Evaluate patient in the morning for residential hospice  Psycho-social/Spiritual:   Desire for further Chaplaincy support:yes  Additional Recommendations: Education on Hospice  Prognosis:   With shift to full comfort; no artificial feeding or hydration now or in the future, no IV antibiotic use, poor p.o. intake, medications for symptom management, patient's prognosis is likely less than 2 weeks  Discharge Planning: This nurse practitioner will reevaluate patient in the morning for residential hospice eligibility.  Family is interested in hospice of [place]   To Be Determined      Primary Diagnoses: Present on Admission: . Sepsis ([place])   I have reviewed the medical record, interviewed the patient and family, and examined the patient. The following aspects are pertinent.  Past Medical History:  Diagnosis Date  . Anemia   . Diabetes ([place])   . GERD (gastroesophageal reflux disease)   .  Hyperlipidemia   . Hypertension   . Leukemia [facility])    Social History   Socioeconomic History  . Marital status: Divorced    Spouse name: Not on file  . Number of children: Not on file  . Years of education: Not on file  . Highest education level: Not on file  Occupational History  . Not on file  Tobacco Use  . Smoking status: Current Every Day Smoker    Packs/day: 0.50    Types: Cigarettes  . Smokeless tobacco: Never Used  Substance and Sexual Activity  . Alcohol use: No     Alcohol/week: 0.0 standard drinks  . Drug use: No  . Sexual activity: Not on file  Other Topics Concern  . Not on file  Social History Narrative  . Not on file   Social Determinants of Health   Financial Resource Strain:   . Difficulty of Paying Living Expenses: Not on file  Food Insecurity:   . Worried About Charity fundraiser in the Last Year: Not on file  . Ran Out of Food in the Last Year: Not on file  Transportation Needs:   . Lack of Transportation (Medical): Not on file  . Lack of Transportation (Non-Medical): Not on file  Physical Activity:   . Days of Exercise per Week: Not on file  . Minutes of Exercise per Session: Not on file  Stress:   . Feeling of Stress : Not on file  Social Connections:   . Frequency of Communication with Friends and Family: Not on file  . Frequency of Social Gatherings with Friends and Family: Not on file  . Attends Religious Services: Not on file  . Active Member of Clubs or Organizations: Not on file  . Attends Archivist Meetings: Not on file  . Marital Status: Not on file   Family History  Problem Relation Age of Onset  . Diabetes Mother   . Diabetes Father    Scheduled Meds: . enoxaparin (LOVENOX) injection  30 mg Subcutaneous Q24H  . insulin aspart  0-9 Units Subcutaneous TID WC  . phenytoin (DILANTIN) IV  100 mg Intravenous Q12H   Continuous Infusions: . sodium chloride 100 mL/hr at 05/31/19 2154  . meropenem (MERREM) IV 1 g (06/01/19 0836)   PRN Meds:.acetaminophen **OR** acetaminophen, LORazepam Medications Prior to Admission:  Prior to Admission medications   Medication Sig Start Date End Date Taking? Authorizing Provider  acetaminophen (TYLENOL) 500 MG tablet Take 500 mg by mouth every 4 (four) hours as needed for mild pain, fever or headache. NOT TO EXCEED 2000 MG IN 24 HOURS   Yes [provider]  alum & mag hydroxide-simeth (MINTOX) [id number] MG/5ML suspension Take 30 mLs by mouth every 6 (six)  hours as needed for indigestion or heartburn. NOT TO EXCEED 4 DOSES IN 24 HOURS   Yes [provider]  amLODipine (NORVASC) 10 MG tablet Take 1 tablet (10 mg total) by mouth daily. 04/15/17  Yes [name], MD  aspirin EC 81 MG tablet Take 81 mg by mouth daily.   Yes [provider]  atorvastatin (LIPITOR) 10 MG tablet Take 10 mg by mouth at bedtime.    Yes [provider]  docusate sodium (COLACE) 100 MG capsule Take 1 capsule (100 mg total) by mouth every 12 (twelve) hours. 12/03/18  Yes McDonald, Mia A, PA-C  gabapentin (NEURONTIN) 300 MG capsule TAKE 1 CAPSULE BY MOUTH 3  TIMES DAILY Patient taking differently: Take 300 mg  by mouth 3 (three) times daily.  08/30/16  Yes [name], MD  guaifenesin (ROBAFEN) 100 MG/5ML syrup Take 100 mg by mouth 3 (three) times daily as needed for cough.   Yes [provider]  hydrALAZINE (APRESOLINE) 50 MG tablet Take 1.5 tablets (75 mg total) by mouth every 8 (eight) hours. 04/15/17  Yes [name], MD  insulin lispro (HUMALOG KWIKPEN) 100 UNIT/ML KiwkPen Inject 3 Units into the skin See admin instructions. Inject 3 units into the skin three times a day and hold for meal time BGL 100 or less; call MD if BGL is >450   Yes [provider]  loperamide (IMODIUM) 2 MG capsule Take 2 mg by mouth daily as needed for diarrhea or loose stools. DO NOT EXCEED 8 DOSES IN 24 HOURS    Yes [provider]  magnesium hydroxide (MILK OF MAGNESIA) 400 MG/5ML suspension Take 30 mLs by mouth at bedtime as needed for mild constipation or moderate constipation.   Yes [provider]  Multiple Vitamin (MULTIVITAMIN WITH MINERALS) TABS tablet Take 1 tablet by mouth daily.   Yes [provider]  PARoxetine (PAXIL) 40 MG tablet Take 40 mg by mouth every morning.   Yes [provider]  polyethylene glycol (MIRALAX / GLYCOLAX) 17 g packet Take 17 g by mouth See admin instructions. Mix 17 grams  into 8 OUNCES OF BEVERAGE OF CHOICE and drink twice daily and once daily as needed for moderate constipation   Yes [provider]  QUEtiapine (SEROQUEL) 25 MG tablet Take 1 tablet (25 mg total) by mouth at bedtime. Patient taking differently: Take 25 mg by mouth 2 (two) times daily.  04/15/17  Yes [name], MD  senna-docusate (SENOKOT-S) 8.6-50 MG tablet Take 1 tablet by mouth daily.   Yes [provider]  Skin Protectants, Misc. (BAZA PROTECT EX) Apply 1 application topically See admin instructions. Apply 0.25 inch of cream to buttocks/peri-area with incontinence care   Yes [provider]  tamsulosin (FLOMAX) 0.4 MG CAPS capsule Take 0.4 mg by mouth daily.   Yes [provider]  valACYclovir (VALTREX) 500 MG tablet Take 500 mg by mouth daily.   Yes [provider]  glucose blood test strip Use as instructed 03/06/15   [name], MD  Lancets 28G MISC 1 each by Does not apply route daily. 03/06/15   [name], MD   No Known Allergies Review of Systems  Unable to perform ROS: Mental status change    Physical Exam Constitutional:      Appearance: He is cachectic. He is ill-appearing.  Cardiovascular:     Rate and Rhythm: Normal rate.  Skin:    General: Skin is warm and dry.  Neurological:     Mental Status: He is lethargic.     Vital Signs: BP (!) 158/60 (BP Location: Right Arm)   Pulse 71   Temp 99.4 F (37.4 C) (Axillary)   Resp (!) 23   Ht 5\' 8"  (1.727 m)   Wt 56.4 kg   SpO2 100%   BMI 18.91 kg/m  Pain Scale: Faces   Pain Score: 0-No pain   SpO2: SpO2: 100 % O2 Device:SpO2: 100 % O2 Flow Rate: .O2 Flow Rate (L/min): 2 L/min  IO: Intake/output summary:   Intake/Output Summary (Last 24 hours) at 06/01/2019 0959 Last data filed at 06/01/2019 0629 Gross per 24 hour  Intake 1175 ml  Output 1780 ml  Net -605 ml  LBM: Last BM Date: 05/31/19 Baseline Weight: Weight: 57.8 kg Most recent weight:  Weight: 56.4 kg     Palliative Assessment/Data: 30 % at best   Discussed with [facility] and bedside RN  Time In: 0945 Time Out: 1100 Time Total: 75 minutes Greater than 50%  of this time was spent counseling and coordinating care related to the above assessment and plan.  Signed by: Manuel Lessen, NP   Please contact Palliative Medicine Team phone at [phone] for questions and concerns.  For individual provider: See Shea Evans

## 2019-06-02 LAB — URINE CYTOLOGY ANCILLARY ONLY
Bacterial Vaginitis-Urine: NEGATIVE
Candida Urine: NEGATIVE
Chlamydia: NEGATIVE
Comment: NEGATIVE
Comment: NEGATIVE
Comment: NORMAL
Neisseria Gonorrhea: NEGATIVE
Trichomonas: NEGATIVE

## 2019-06-02 MED ORDER — GLYCOPYRROLATE 0.2 MG/ML IJ SOLN
0.2000 mg | INTRAMUSCULAR | Status: DC | PRN
  Administered 2019-06-03: 0.2 mg via INTRAVENOUS
  Filled 2019-06-02: qty 1

## 2019-06-02 MED ORDER — SCOPOLAMINE 1 MG/3DAYS TD PT72
1.0000 | MEDICATED_PATCH | TRANSDERMAL | Status: DC
  Administered 2019-06-02: 1.5 mg via TRANSDERMAL
  Filled 2019-06-02: qty 1

## 2019-06-02 MED ORDER — LORAZEPAM 1 MG PO TABS
1.0000 mg | ORAL_TABLET | Freq: Four times a day (QID) | ORAL | Status: DC
  Administered 2019-06-02: 1 mg via ORAL
  Filled 2019-06-02: qty 1

## 2019-06-02 NOTE — Progress Notes (Signed)
The patient had some vomiting this afternoon. Patient suctioned but still has thick secretions. Paged on call Anwar with Palliative care for orders to help patient clear secretions while continuing to keep him comfortable.   Saddie Benders RN

## 2019-06-02 NOTE — Progress Notes (Signed)
The patients daughter [name] called and was updated with the plan and comfort measures that had taken place throughout the morning. Daughter to come and visit this evening.   Saddie Benders RN

## 2019-06-02 NOTE — Progress Notes (Addendum)
MEDICATION RELATED CONSULT NOTE - INITIAL   Pharmacy Consult for Phenytoin Indication: Seizures   No Known Allergies  Patient Measurements: Height: 5\' 8"  (172.7 cm) Weight: 124 lb 5.4 oz (56.4 kg) IBW/kg (Calculated) : 68.4   Vital Signs: Temp: 99.8 F (37.7 C) (03/03 0823) Temp Source: Axillary (03/03 0823) BP: 162/67 (03/03 0823) Intake/Output from previous day: 03/02 0701 - 03/03 0700 In: 820 [P.O.:820] Out: 1100 [Urine:1100] Intake/Output from this shift: Total I/O In: 200 [P.O.:200] Out: 300 [Urine:300]  Labs: Recent Labs    05/31/19 0313 06/01/19 0331 06/01/19 0456  WBC 12.9*  --  11.9*  HGB 7.8*  --  7.7*  HCT 25.1*  --  25.1*  PLT 178  --  205  CREATININE 2.15* 1.93*  --   ALBUMIN 2.1*  --   --   PROT 5.6*  --   --   AST 16  --   --   ALT 12  --   --   ALKPHOS 66  --   --   BILITOT 0.6  --   --    Estimated Creatinine Clearance: 27.6 mL/min (A) (by C-G formula based on SCr of 1.93 mg/dL (H)).  Assessment: 74 yo M with hx dementia and baseline non-verbal with new arm twitching, abnormal EEG and worsened mental status.   Baseline LFTs wnl. Currently with AKI on CKD. Loaded with fosphenytoin 20mg /kg per neurologist. Will start lower dose of phenytoin 100mg  Q12 hr given low weight and poor renal function.   3/2 3:31 PHT total level = 13.4, corrected total level =25.8, free level =1.9-2.5 ar both in goal. However, level is not a true trough and was drawn just two days after load so steady state will be lower. Patient is on comfort care with poor mental status at baseline so unable to assess for many side effects.   Goal of Therapy:  Total phenytoin level 10- 20 mg/L Free phenytoin level 1- 2 mg/L   Plan:  Continue phenytoin 100mg  ER Q12 hr  If unstable, can repeat phenytoin trough and albumin in 3 - 5 days  Monitor CBC, LFTs, Renal fx, clinical status   ADDENDUM 3/4: Discussed with FM team, ok to stop checking phenytoin levels given comfort care  status and first level at goal and anticipate levels to decrease. MD unable to open patient's eyes to check for nystagmus.   Benetta Spar, PharmD, BCPS, BCCP Clinical Pharmacist  Please check AMION for all [place] phone numbers After 10:00 PM, call [place] [phone]

## 2019-06-02 NOTE — Social Work (Signed)
CSW has made referral to [facility] and Bevely Palmer, RN liaisons with [place] regarding pt family interest in [facility].   Westley Hummer, MSW, [place] Work

## 2019-06-02 NOTE — Progress Notes (Signed)
Engineer, maintenance [facility]) Hospital Liaison note.    Received request from [place], CSW for family interest in [place]. Spoke with daughter [name] to answer questions and explain services.  Patient information has been submitted to [place] for review. Hospital Liaisons will follow up with [facility] manager and family if a room is available and patient is eligible.   Please do not hesitate to call with questions.    Thank you,   Farrel Gordon, RN, CCM      [place] (listed on AMION under Hospice and [place] [facility] [place])    [phone]

## 2019-06-02 NOTE — Progress Notes (Signed)
Family Medicine Teaching Service Daily Progress Note Intern Pager: [phone]  Patient name: Manuel Randolph. Medical record number: [id number] Date of birth: [birth year] Age: 74 y.o. Gender: male  Primary Care Provider: System, Pcp Not In Consultants: None Code Status: Full  Pt Overview and Major Events to Date:  2/26-admitted 2/27-E. coli positive.  Ceftriaxone started 2/28-ESBL identified.  Meropenem started per pharmacy consult  Assessment and Plan: Manuel Randolph.is a 74 y.o.malepresenting with AMS and sepsis. PMH is significant fordementia, DMT2, HTN, [name], PVD, AVM of small intestine, CKD, h/o urinary retention, penile implant.  AMS  UroSepsis w/ E.coli Bacteremia Patient still minimally responsive.  Was able to try to speak but was mostly unintelligible this morning.  Urine culture and blood culture positive for E.coli.  Urine Cx growing ESBL E. Coli. RPR, HIV negative.  Curbside ID regarding management for ESBL.  Stated we can start a Carbapenem.  Did not have a preference.  Recommended 7 days minimum IV treatment.  Daughter was worried about patient's mental status.  MRI brain and EEG were ordered to evaluate for stroke/seizure.  MRI showed no evidence of acute intracranial abnormality.  Chronic small vessel ischemia with numerous chronic lacunar infarcts.  Multiple chronic microhemorrhages likely secondary to chronic hypertension.  WBCs 12.9 >11.9 -Start meropenem.  (2/28-1/6) - Abx: cefepime, flagyl, vanc (2/26), CTX (2/27 - 2/28).   -continuous cardiac monitoring, pulse ox -EEG showed right frontocentral sharp waves so the patient was loaded with fosphenytoin and then started on maintenance phenytoin -Most recent phenytoin level was 13.4 -Continue phenytoin 100 mg twice daily -NS IVF @ 110mL/hr -Soft diet  -Morphine for pain, patient is comfort care  Hx of COVID infection COVID-19 now positive but pt testedCOVID-19 positive on 04/28/19 (not in our system),  received vaccines on 1/22 and 2/19, out of 21 day window and will likely continue to test COVID positive.  - see infection prevention note  Penile Implant  H/O urinary retention  Peyronie's disease  HSV Patient with h/o of ED and peyronie's disease, had an inflatable penile prosthesis placed in 11/2012. Home medications flomax 0.4 mg, and valtrex. Patient also has a h/o HSV, currently ulcer presentjust proximal to the dorsal side ofglans of penis.  -Implant is MRI compatible and patient had MRI -We will discuss removal with family prior to initiating urology consult.  Now that the patient is on comfort care this may not be necessary but this is a likely source for this patient's recurrent urosepsis   DM2 with peripheral neuropathy Home medications include Insulin lispro 3U TID and hold for meal time BGL 100 or less, lantus 12 U daily. Gabapentin 300 mg TID (renal dosing). -sliding scale insulin with CBG q4h -hold home meds, gabapentin held d/t NPO  AKI onCKD Stage 3 Baseline cr in January 2020 around 1.7, on 2/18. 2.86>3.09>2.58>2.15>1.93. BUN 43.  Likely dehydration.  -Avoid nephrotoxic agents as much as possible -IVF NS @ 100 mL/hr -Patient is comfort care, discontinue daily labs  HTN Hypertensive today.  Home medications include amlodipine 10mg , hydralazine 75 mg TID. -Given patient is now on soft diet will restart amlodipine 10 mg -Holding hydralazine at this time  HLD Home medications include ASA 81 mg, atorvastatin 10 mg. -Holding home meds at this time  Dementia Multiple chronic lacunar infarcts on CT head. Home medication paxil 40 mg, seroquel 25 mg qhs. -NPO currently, hold home medications -Palliative consult placed in completed.  Patient is DNR and it appears they may be moving  towards comfort care -Palliative is going to reevaluate today 3/3 for residential hospice eligibility  Chronic Iron deficiency anemia, d/t chronic blood loss Known small bowel AVM,  colonoscopy 2014 completely normal, capsule endoscopy 2015 small bowel angioectasia. Baseline hgb around 9-10. Hgb 7.4 this AM. -Type and screen; consider transfusing  Aortic Valvestenosis Mild aortic valve stenosis initially documented on echo 12/2007.Holosystolic murmur 2/6 auscultated.Echo in January 2019: LVEF 123456, normal systolic function, normal wall motion, G1DD, mild stenosis noted of aortic valve (mean gradient 14 mmHg), mitral valve with calcified annulus.   [name] Currently stable. Diagnosed with Rai stage 0 [name] in 2014, flow cytometry c/w [name].   PVD H/o of ulcerations and gangrene of L foot, popliteal artery angioplasty L 2014.  Tobacco use Patient has >50 years smoking history. No CT on file.  FEN/GI:NPO Prophylaxis:Lovenox  Disposition: Back to facility pending improvement  Subjective:  Patient is sitting up in iron of the room.  He glances towards me and when I ask a question responds but I am unable to understand what he says.  For the rest of the evaluation he stares blankly off into space.  Objective: Temp:  [98.2 F (36.8 C)-98.3 F (36.8 C)] 98.2 F (36.8 C) (03/02 2003) Pulse Rate:  [65-77] 69 (03/02 2003) Resp:  [19-27] 21 (03/02 2003) BP: (163-168)/(69-89) 168/89 (03/02 2003) SpO2:  [98 %-100 %] 100 % (03/02 2003) Physical Exam: General: Sitting up, no acute distress Cardiovascular: Harsh decrescendo systolic murmur.  Regular rhythm.  1+ radial pulses bilaterally Respiratory: Lungs clear to auscultation bilaterally.  No crackles or wheezes. Abdomen: Soft, tender to palpation diffusely.  Normal bowel sounds. Extremities: Thin extremities.  No edema.  Laboratory: Recent Labs  Lab 05/30/19 0259 05/31/19 0313 06/01/19 0456  WBC 12.0* 12.9* 11.9*  HGB 7.4* 7.8* 7.7*  HCT 23.5* 25.1* 25.1*  PLT 177 178 205   Recent Labs  Lab 05/28/19 1404 05/28/19 1404 05/29/19 0406 05/29/19 0406 05/30/19 0259 05/31/19 0313 06/01/19 0331  NA 138    < > 142   < > 141 145 146*  K 5.3*   < > 4.6   < > 4.3 4.2 4.0  CL 107   < > 113*   < > 116* 115* 119*  CO2 20*   < > 20*   < > 19* 19* 19*  BUN 50*   < > 48*   < > 43* 34* 25*  CREATININE 2.86*   < > 3.09*   < > 2.58* 2.15* 1.93*  CALCIUM 8.7*   < > 7.9*   < > 7.9* 8.1* 8.1*  PROT 7.3  --  5.8*  --   --  5.6*  --   BILITOT 0.7  --  0.4  --   --  0.6  --   ALKPHOS 87  --  68  --   --  66  --   ALT 17  --  12  --   --  12  --   AST 21  --  18  --   --  16  --   GLUCOSE 196*   < > 105*   < > 129* 120* 86   < > = values in this interval not displayed.     Imaging/Diagnostic Tests: No results found.   Gifford Shave, MD 06/02/2019, 6:43 AM PGY-1, [place] Intern pager: [phone], text pages welcome

## 2019-06-02 NOTE — Progress Notes (Signed)
FPTS Interim Progress Note  Called daughter Sahid Milici and spoke with her regarding patient's penis implant.  Explained that discussion amongst the team has been that the penis implant is likely a source of infection given patient's current admission with urosepsis and other recent UTIs.  Explained that while there is no guarantee that a urologist would agree to perform a procedure on this very ill patient to remove the penis implant, it is ultimately up to the family if they would like a urology consult to consider this.  Informed her that if the penis implant remains in place, it would likely continue to seed infection, but also informed that he stopped his current, unfinished treatment for his current urosepsis and E coli bacteremia given change to comfort care.  Also advised that there is no guarantee that patient will make it through the procedure or until the procedure could be performed, if it can at all.  She voiced understanding of all of the above and would like to have a conversation with the rest of her family members before making a decision.  Team plans to call Ms. Galante back on 3/4 to determine family's decision regarding inquiry for possible penis implant removal.  Jaleigh Mccroskey, Bernita Raisin, DO 06/02/2019, 2:11 PM PGY-2, [place] Medicine Service pager [phone]

## 2019-06-02 NOTE — Progress Notes (Signed)
Patient ID: Manuel Randolph., male   DOB: 05-19-1945, 74 y.o.   MRN: [id number]  This NP visited patient at the bedside as a follow up to  yesterday's [place] for palliative medicine needs and emotional support.  Spoke to [employer] by telephone regarding current medical situation.  Family is comfortable with decision for a plan  of care focused on comfort and dignity, allowing a natural death.   Plan of care: - DNR/DNI -No artificial feeding or hydration now or in the future -No further antibiotic use -Comfort feeds as tolerated -Medications (Morphine Francee Gentile) for symptom management  -Family is hopeful for inpatient hospice unit for end of life care.  They are requesting [facility]  Patient continues with slow decline; poor po intake, elevated WBC, elevated creatine, tachycardia   Prognosis is likely less than 2 weeks.  Questions and concerns addressed   Discussed with bedside RN  Total time spent on the unit was 35 minutes  Greater than 50% of the time was spent in counseling and coordination of care  Wadie Lessen NP  Palliative Medicine Team Team Phone # [phone] Pager [phone]

## 2019-06-02 NOTE — Progress Notes (Signed)
The patients daughter [name] has arrived and has been updated.   Saddie Benders RN

## 2019-06-03 DIAGNOSIS — R627 Adult failure to thrive: Secondary | ICD-10-CM

## 2019-06-03 MED ORDER — MORPHINE SULFATE (CONCENTRATE) 10 MG/0.5ML PO SOLN: 5.0000 mg | Freq: Four times a day (QID) | ORAL | 0 refills | Status: AC

## 2019-06-03 MED ORDER — LORAZEPAM 1 MG PO TABS: 1.0000 mg | ORAL_TABLET | Freq: Four times a day (QID) | ORAL | 0 refills | Status: AC

## 2019-06-03 MED ORDER — ACETAMINOPHEN 650 MG RE SUPP: 650.0000 mg | Freq: Four times a day (QID) | RECTAL | 0 refills | Status: AC | PRN

## 2019-06-03 MED ORDER — SCOPOLAMINE 1 MG/3DAYS TD PT72: 1.0000 | MEDICATED_PATCH | TRANSDERMAL | 12 refills | Status: AC

## 2019-06-03 MED ORDER — GLYCOPYRROLATE 0.2 MG/ML IJ SOLN: 0.2000 mg | INTRAMUSCULAR | 0 refills | Status: AC | PRN

## 2019-06-03 MED ORDER — ACETAMINOPHEN 325 MG PO TABS: 650.0000 mg | ORAL_TABLET | Freq: Four times a day (QID) | ORAL | 0 refills | Status: AC | PRN

## 2019-06-03 MED ORDER — PHENYTOIN SODIUM EXTENDED 100 MG PO CAPS: 100.0000 mg | ORAL_CAPSULE | Freq: Two times a day (BID) | ORAL | 0 refills | Status: AC

## 2019-06-03 NOTE — Progress Notes (Addendum)
Report given to [facility] RN Sharlyne Pacas. RN requested that PIV is left in place.

## 2019-06-03 NOTE — TOC Progression Note (Signed)
Transition of Care (TOC) - Progression Note    Patient Details  Name: Manuel RANCOURT Sr. MRN: [id number] Date of Birth: 03-12-1946  Transition of [facility] CM/SW [place], [place] Phone Number: 06/03/2019, 11:49 AM  Clinical Narrative:    CSW contacted by [facility] liaison and informed patient was accepted by [facility].   CSW contacted resident covering patient and provided an update. CSW will continue to follow and assist with discharge planning needs.      Expected Discharge Plan and Services                                                 Social Determinants of Health (SDOH) Interventions    Readmission Risk Interventions No flowsheet data found.

## 2019-06-03 NOTE — Progress Notes (Signed)
Updated [name], Pt's daughter on plan of care and probably discharge to [facility] this afternoon. No further questions at this time. Will continue to monitor.

## 2019-06-03 NOTE — Progress Notes (Signed)
Spoke with Pt's daughter [name] over the phone and updated her with the plan of care and pt's status. Will continue to monitor.

## 2019-06-03 NOTE — Progress Notes (Signed)
Pt.is discharged to [facility] via [facility] .

## 2019-06-03 NOTE — TOC Transition Note (Addendum)
Transition of Care [facility]) - CM/SW Discharge Note   Patient Details  Name: Manuel SPRAKER Sr. MRN: [id number] Date of Birth: Jul 18, 1945  [facility]) CM/SW Contact:  Arvella Merles, LCSW Phone Number: 06/03/2019, 1:53 PM   Clinical Narrative:    Patient will DC to: [facility] Anticipated DC date: 06/03/19 Family notified: [name], daughter Transport by: Corey Harold   Per MD patient ready for DC to [facility]. RN, patient, patient's family, and facility notified of DC. Discharge Summary and FL2 sent to facility. RN to call report prior to discharge [phone]). DC packet on chart. Ambulance transport requested for patient.   CSW will sign off for now as social work intervention is no longer needed. Please consult [place] again if new needs arise.    Final next level of care: [place] Barriers to Discharge: No Barriers Identified   Patient Goals and CMS Choice   CMS Medicare.gov Compare Post Acute Care list provided to:: Patient Represenative (must comment)(Tin[place], daughter) Choice offered to / list presented to : Adult Children  Discharge Placement                  Name of family member notified: [name] [phone] Patient and family notified of of transfer: 06/03/19  Discharge Plan and Services                                     Social Determinants of Health (SDOH) Interventions     Readmission Risk Interventions No flowsheet data found.

## 2019-06-05 LAB — CULTURE, BLOOD (ROUTINE X 2)
Culture: NO GROWTH
Culture: NO GROWTH
Special Requests: ADEQUATE

## 2019-07-01 DEATH — deceased

## 2019-09-27 IMAGING — CR DG CHEST 2V
2 series · 2 of 2 positions shown · non-contrast
Comparison: 11/13/2016 chest radiograph.

CLINICAL DATA: Hypothermia

EXAM:
CHEST  2 VIEW

[chest ap]
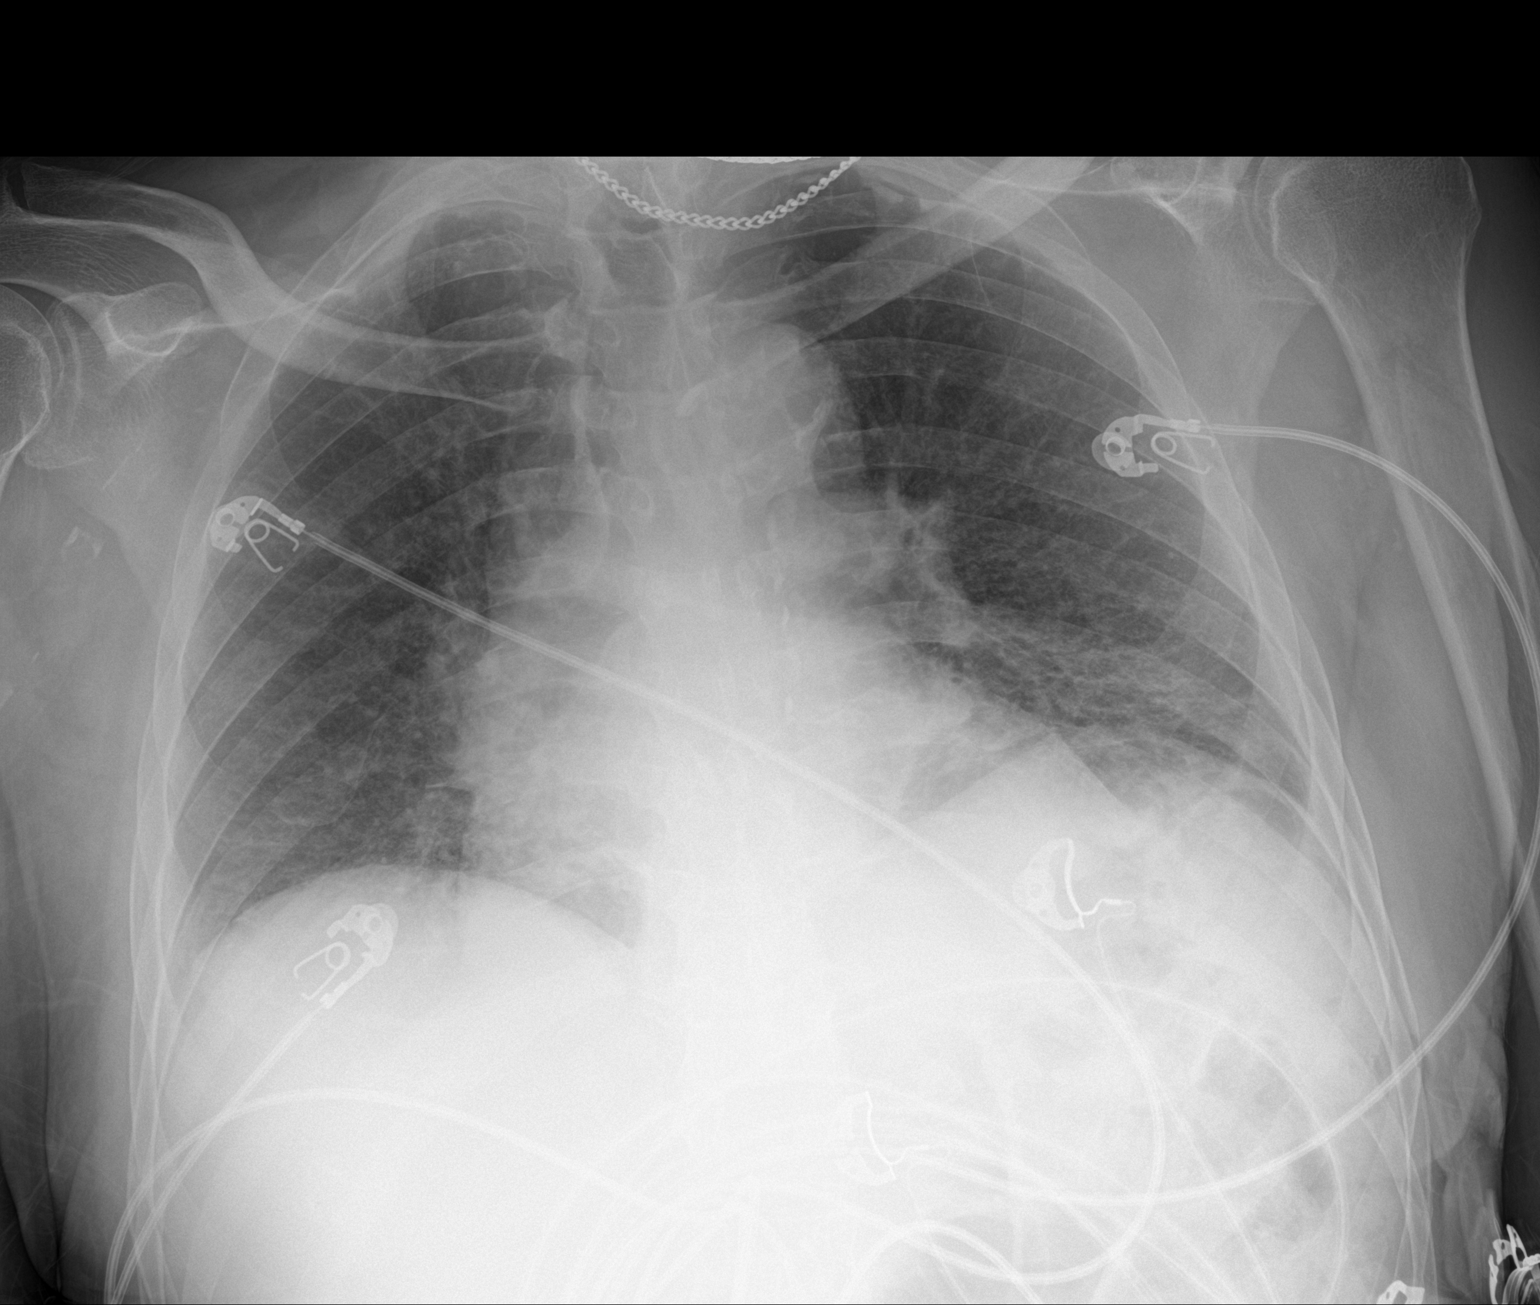

[w chest lat]
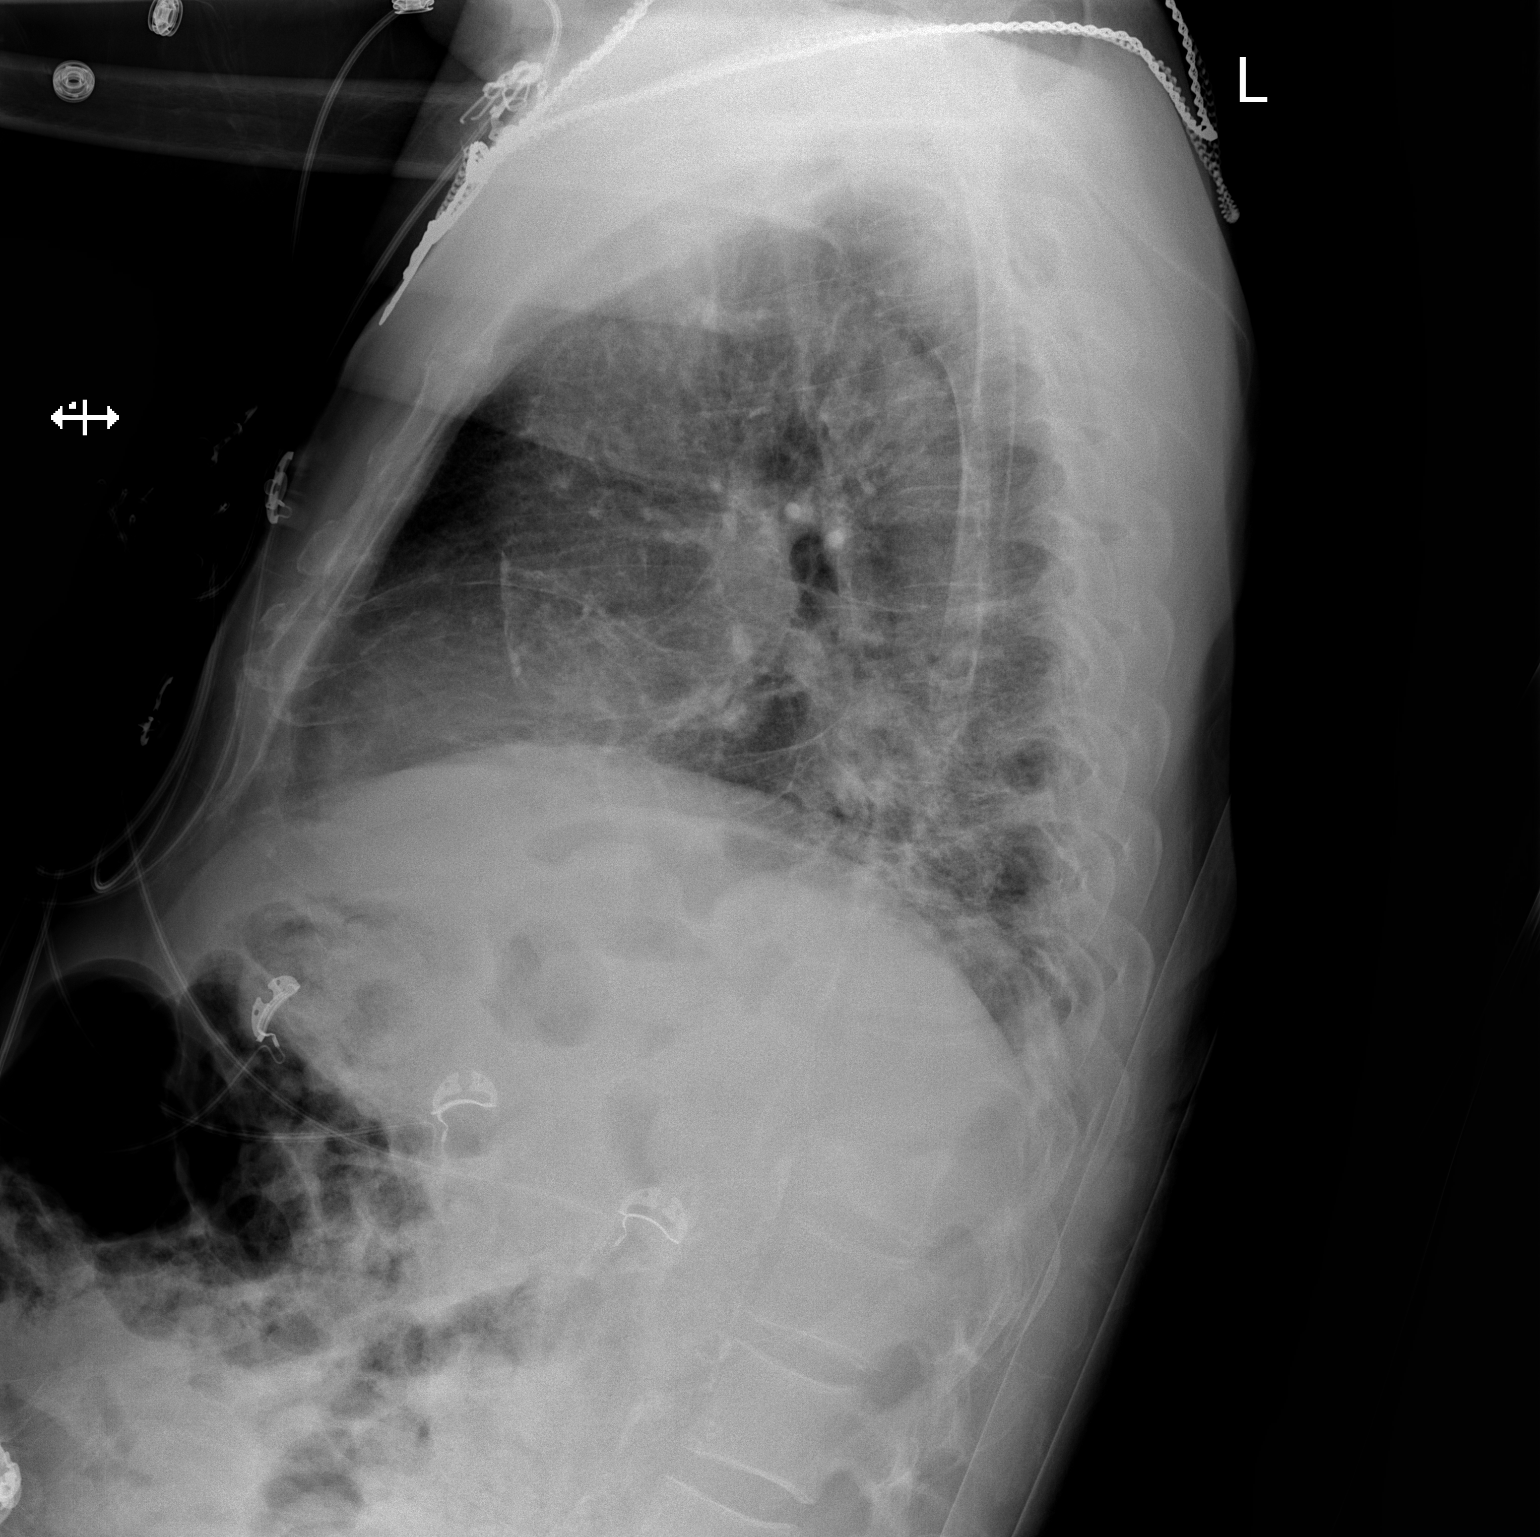

[2 of 2 positions shown; findings below may reference images not displayed]

FINDINGS: Slightly low lung volumes. Stable cardiomediastinal silhouette with
normal heart size. No pneumothorax. No pleural effusion. No
pulmonary edema. Hazy and curvilinear bibasilar lung opacities.
IMPRESSION: Hazy and curvilinear bibasilar lung opacities with slightly low lung
volumes. Favor atelectasis, difficult to exclude aspiration or
developing pneumonia. Short-term follow-up PA and lateral chest
radiographs advised.

## 2019-09-27 IMAGING — CT CT HEAD W/O CM
3 series · 15 of 47 positions shown, 18 images · non-contrast
Comparison: 11/13/2016

CLINICAL DATA: Altered mental status with generalized weakness and
hyperglycemia.

EXAM:
CT HEAD WITHOUT CONTRAST
TECHNIQUE: Contiguous axial images were obtained from the base of the skull
through the vertex without intravenous contrast.

[Series 2: head wo · axial · 0.47mm/px · z∈[-129,+11]mm · 9 of 34 slices shown, 12 images]
[im 3/34  brain]
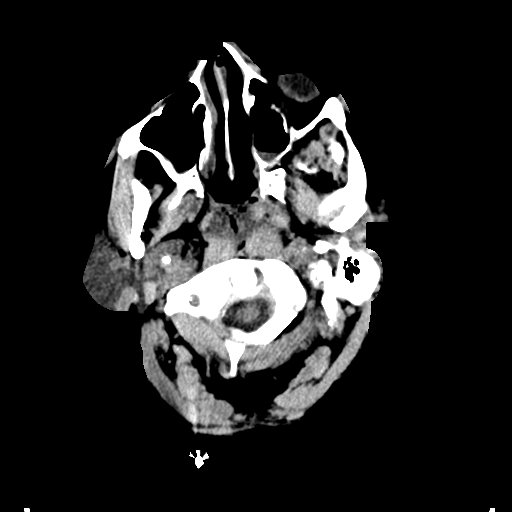
[im 3/34  bone]
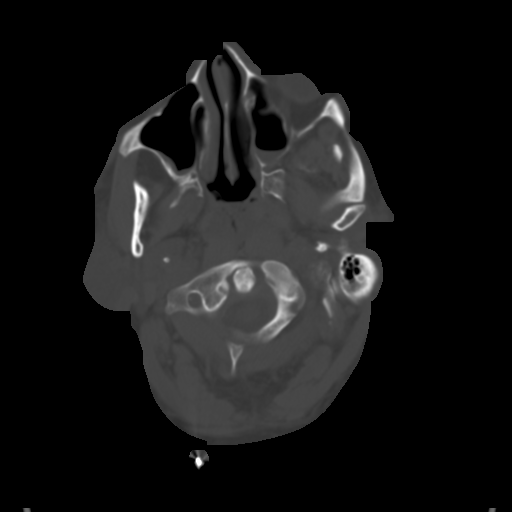
[im 6/34  brain]
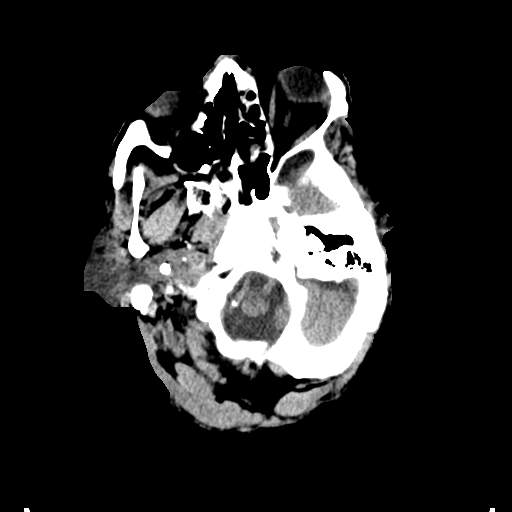
[im 10/34  brain]
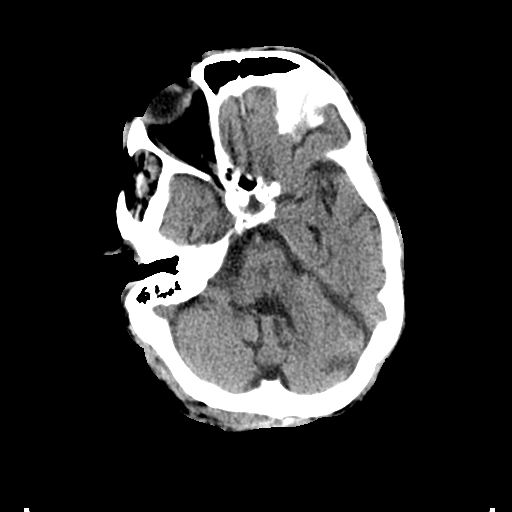
[im 13/34  brain]
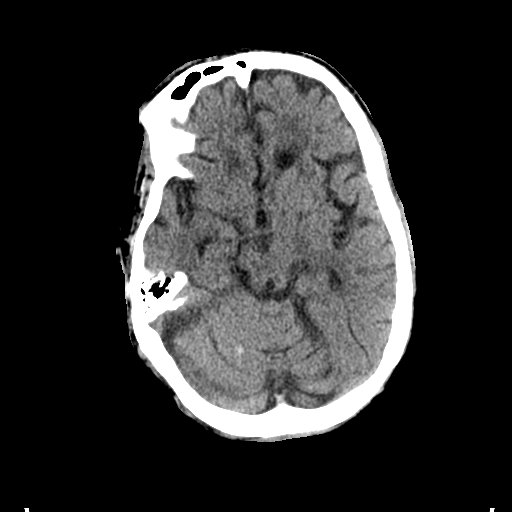
[im 18/34  brain]
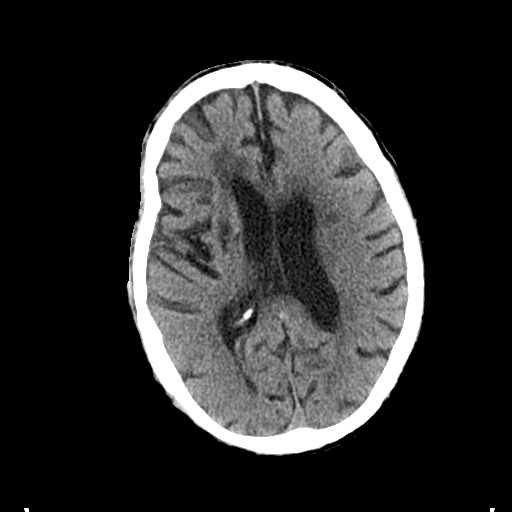
[im 18/34  bone]
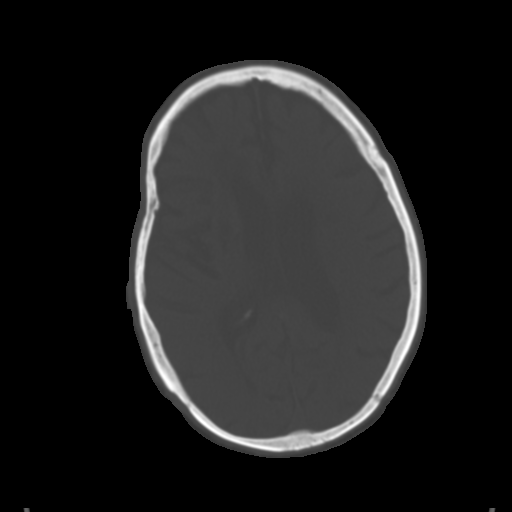
[im 21/34  brain]
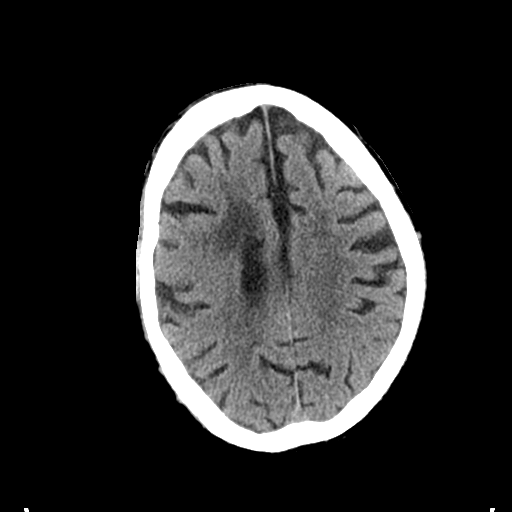
[im 24/34  brain]
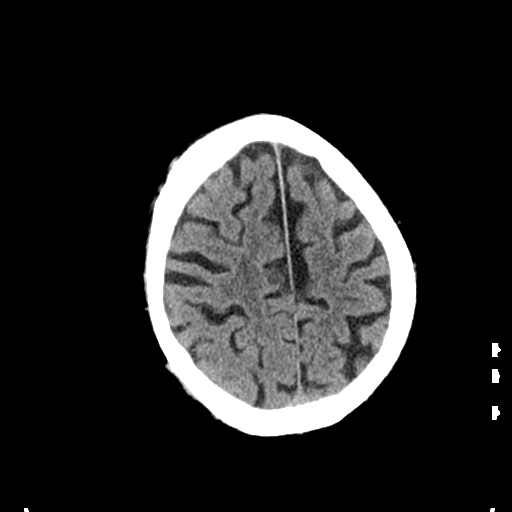
[im 28/34  brain]
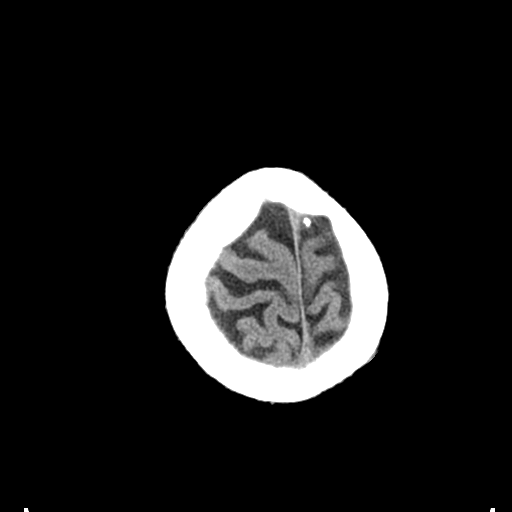
[im 31/34  brain]
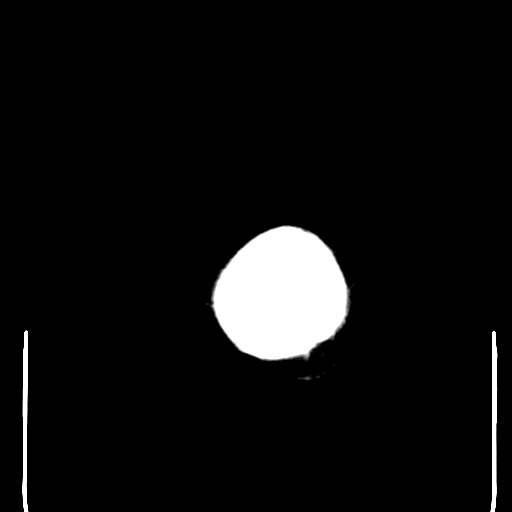
[im 31/34  bone]
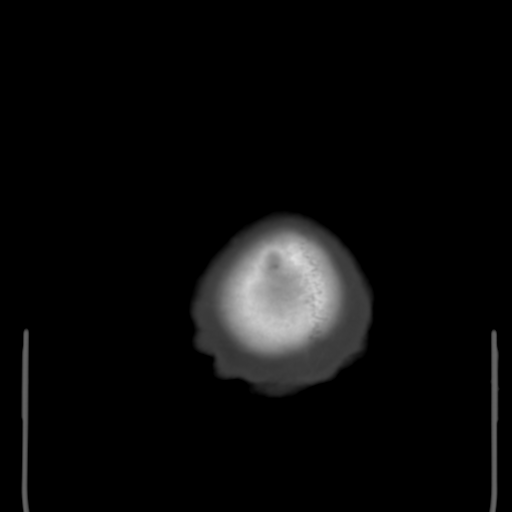

[Series 5: coronal soft tissue · coronal · 0.33mm/px · 3 of 70 slices shown]
[im 24/70  brain]
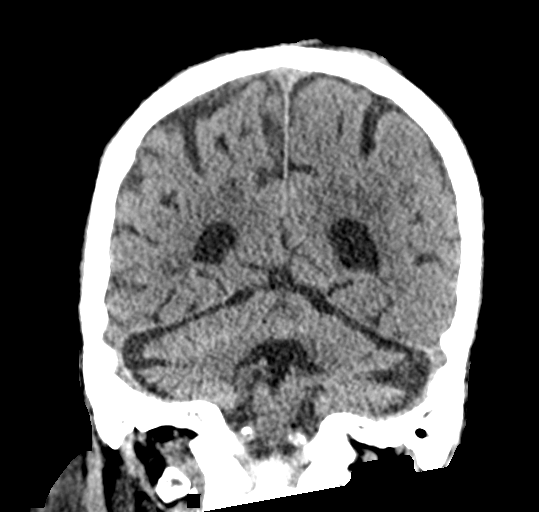
[im 31/70  brain]
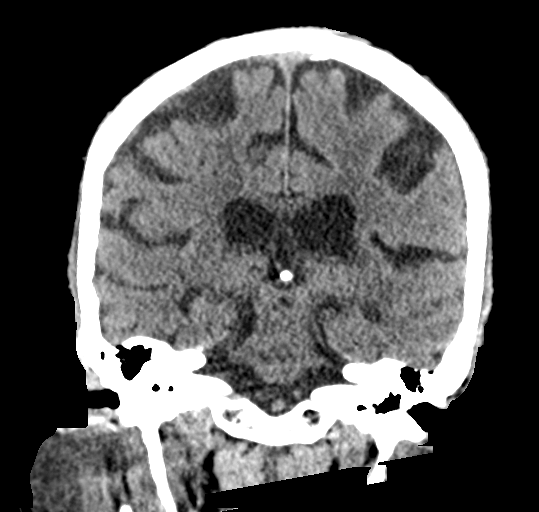
[im 39/70  brain]
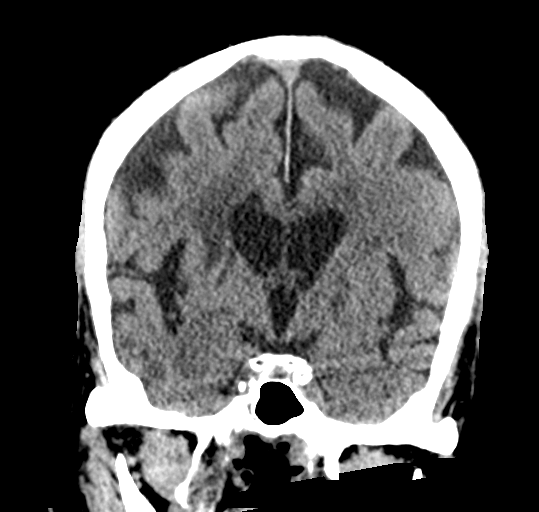

[Series 6: sagittal soft tissue · sagittal · 0.33mm/px · 3 of 60 slices shown]
[im 23/60  brain]
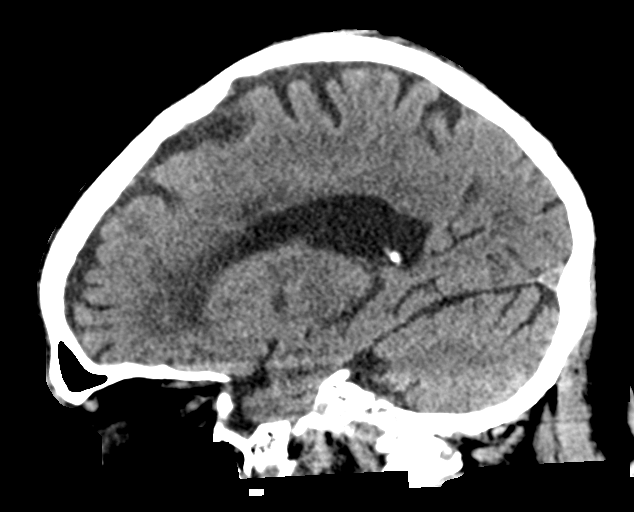
[im 30/60  brain]
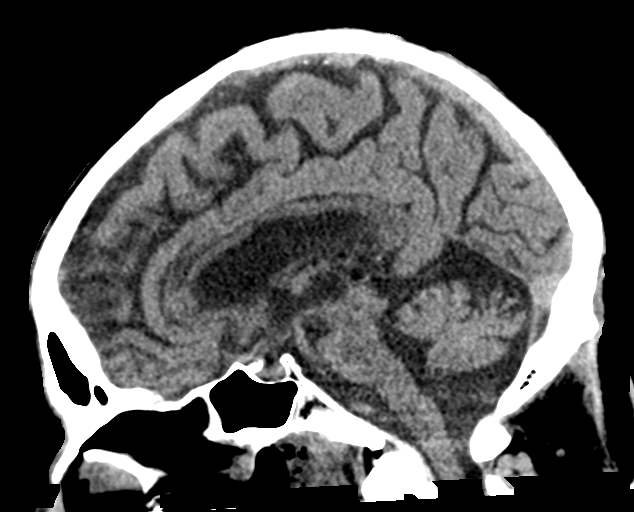
[im 37/60  brain]
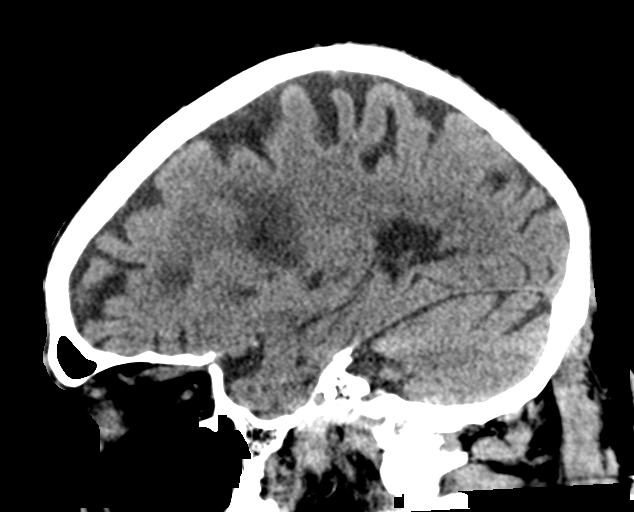

[15 of 47 positions shown; findings below may reference images not displayed]

FINDINGS: Brain: Examination demonstrates minimal age related atrophic change
which is stable. Ventricles and cisterns are otherwise unchanged.
There is chronic ischemic microvascular disease present. Old lacunar
infarct over the left lentiform nucleus. Small old infarct in the
right periventricular white matter and right basal ganglia. No
evidence of focal mass, mass effect, shift of midline structures or
acute hemorrhage.

Vascular: No hyperdense vessel or unexpected calcification.

Skull: Normal. Negative for fracture or focal lesion.

Sinuses/Orbits: Orbits are within normal. Paranasal sinuses are well
developed as there is mild mucosal membrane thickening/opacification
of on the ethmoid sinus and right maxillary sinus with small
air-fluid level over the left maxillary sinus. Mastoid air cells are
clear.

Other: Prominence of the parotid glands right worse than left which
are partially visualized. No definite mass or adenopathy.
IMPRESSION: No acute findings.

Mild atrophy with chronic ischemic microvascular disease. Several
small old central infarcts as described.

Sinus inflammatory change as described.
